# Patient Record
Sex: Female | Born: 1935
Health system: Southern US, Community
[De-identification: ages and names within clinical notes are randomized; demographics above are authoritative.]

## PROBLEM LIST (undated history)

## (undated) DIAGNOSIS — E1121 Type 2 diabetes mellitus with diabetic nephropathy: Secondary | ICD-10-CM

## (undated) DIAGNOSIS — K5792 Diverticulitis of intestine, part unspecified, without perforation or abscess without bleeding: Secondary | ICD-10-CM

## (undated) DIAGNOSIS — E785 Hyperlipidemia, unspecified: Secondary | ICD-10-CM

## (undated) DIAGNOSIS — Z923 Personal history of irradiation: Secondary | ICD-10-CM

## (undated) DIAGNOSIS — D333 Benign neoplasm of cranial nerves: Secondary | ICD-10-CM

## (undated) DIAGNOSIS — L97512 Non-pressure chronic ulcer of other part of right foot with fat layer exposed: Secondary | ICD-10-CM

## (undated) DIAGNOSIS — G629 Polyneuropathy, unspecified: Secondary | ICD-10-CM

## (undated) DIAGNOSIS — Z853 Personal history of malignant neoplasm of breast: Secondary | ICD-10-CM

## (undated) DIAGNOSIS — M810 Age-related osteoporosis without current pathological fracture: Secondary | ICD-10-CM

## (undated) DIAGNOSIS — I779 Disorder of arteries and arterioles, unspecified: Secondary | ICD-10-CM

## (undated) DIAGNOSIS — Z8719 Personal history of other diseases of the digestive system: Secondary | ICD-10-CM

## (undated) DIAGNOSIS — E119 Type 2 diabetes mellitus without complications: Secondary | ICD-10-CM

## (undated) DIAGNOSIS — M1990 Unspecified osteoarthritis, unspecified site: Secondary | ICD-10-CM

## (undated) HISTORY — PX: VENTRAL HERNIA REPAIR: SHX424

## (undated) HISTORY — PX: SKIN GRAFT: SHX250

## (undated) HISTORY — DX: Benign neoplasm of cranial nerves: D33.3

## (undated) HISTORY — DX: Hyperlipidemia, unspecified: E78.5

## (undated) HISTORY — PX: FOOT SURGERY: SHX648

## (undated) HISTORY — DX: Age-related osteoporosis without current pathological fracture: M81.0

## (undated) HISTORY — PX: APPENDECTOMY: SHX54

## (undated) HISTORY — PX: BREAST LUMPECTOMY WITH AXILLARY LYMPH NODE DISSECTION: SHX5756

---

## 1966-05-11 HISTORY — PX: BUNIONECTOMY: SHX129

## 1998-05-11 HISTORY — PX: BREAST LUMPECTOMY: SHX2

## 2000-05-11 HISTORY — PX: BREAST LUMPECTOMY: SHX2

## 2007-05-12 DIAGNOSIS — Z8719 Personal history of other diseases of the digestive system: Secondary | ICD-10-CM

## 2007-05-12 HISTORY — DX: Personal history of other diseases of the digestive system: Z87.19

## 2007-05-12 HISTORY — PX: HEMICOLECTOMY: SHX854

## 2015-05-12 HISTORY — PX: CARPAL TUNNEL RELEASE: SHX101

## 2015-05-12 HISTORY — PX: OTHER SURGICAL HISTORY: SHX169

## 2016-05-12 DIAGNOSIS — Z08 Encounter for follow-up examination after completed treatment for malignant neoplasm: Secondary | ICD-10-CM | POA: Diagnosis not present

## 2016-05-12 DIAGNOSIS — Z7189 Other specified counseling: Secondary | ICD-10-CM | POA: Diagnosis not present

## 2016-05-12 DIAGNOSIS — L821 Other seborrheic keratosis: Secondary | ICD-10-CM | POA: Diagnosis not present

## 2016-05-12 DIAGNOSIS — L853 Xerosis cutis: Secondary | ICD-10-CM | POA: Diagnosis not present

## 2016-05-12 HISTORY — PX: FRACTURE SURGERY: SHX138

## 2016-05-13 DIAGNOSIS — S0993XA Unspecified injury of face, initial encounter: Secondary | ICD-10-CM | POA: Diagnosis not present

## 2016-05-13 DIAGNOSIS — S82002A Unspecified fracture of left patella, initial encounter for closed fracture: Secondary | ICD-10-CM | POA: Diagnosis not present

## 2016-05-13 DIAGNOSIS — S79912A Unspecified injury of left hip, initial encounter: Secondary | ICD-10-CM | POA: Diagnosis not present

## 2016-05-13 DIAGNOSIS — J3489 Other specified disorders of nose and nasal sinuses: Secondary | ICD-10-CM | POA: Diagnosis not present

## 2016-05-13 DIAGNOSIS — S82042A Displaced comminuted fracture of left patella, initial encounter for closed fracture: Secondary | ICD-10-CM | POA: Diagnosis not present

## 2016-05-13 DIAGNOSIS — M25562 Pain in left knee: Secondary | ICD-10-CM | POA: Diagnosis not present

## 2016-05-13 DIAGNOSIS — S0990XA Unspecified injury of head, initial encounter: Secondary | ICD-10-CM | POA: Diagnosis not present

## 2016-05-13 DIAGNOSIS — S0121XA Laceration without foreign body of nose, initial encounter: Secondary | ICD-10-CM | POA: Diagnosis not present

## 2016-05-13 DIAGNOSIS — S7002XA Contusion of left hip, initial encounter: Secondary | ICD-10-CM | POA: Diagnosis not present

## 2016-05-14 DIAGNOSIS — Z7984 Long term (current) use of oral hypoglycemic drugs: Secondary | ICD-10-CM | POA: Diagnosis not present

## 2016-05-14 DIAGNOSIS — M549 Dorsalgia, unspecified: Secondary | ICD-10-CM | POA: Diagnosis present

## 2016-05-14 DIAGNOSIS — E119 Type 2 diabetes mellitus without complications: Secondary | ICD-10-CM | POA: Diagnosis not present

## 2016-05-14 DIAGNOSIS — S82002A Unspecified fracture of left patella, initial encounter for closed fracture: Secondary | ICD-10-CM | POA: Diagnosis not present

## 2016-05-14 DIAGNOSIS — E1140 Type 2 diabetes mellitus with diabetic neuropathy, unspecified: Secondary | ICD-10-CM | POA: Diagnosis not present

## 2016-05-14 DIAGNOSIS — G629 Polyneuropathy, unspecified: Secondary | ICD-10-CM | POA: Diagnosis not present

## 2016-05-14 DIAGNOSIS — S82001A Unspecified fracture of right patella, initial encounter for closed fracture: Secondary | ICD-10-CM | POA: Diagnosis not present

## 2016-05-14 DIAGNOSIS — S7002XA Contusion of left hip, initial encounter: Secondary | ICD-10-CM | POA: Diagnosis not present

## 2016-05-14 DIAGNOSIS — Y92019 Unspecified place in single-family (private) house as the place of occurrence of the external cause: Secondary | ICD-10-CM | POA: Diagnosis not present

## 2016-05-14 DIAGNOSIS — S0993XA Unspecified injury of face, initial encounter: Secondary | ICD-10-CM | POA: Diagnosis not present

## 2016-05-14 DIAGNOSIS — E7800 Pure hypercholesterolemia, unspecified: Secondary | ICD-10-CM | POA: Diagnosis present

## 2016-05-14 DIAGNOSIS — Z87891 Personal history of nicotine dependence: Secondary | ICD-10-CM | POA: Diagnosis not present

## 2016-05-14 DIAGNOSIS — S0121XA Laceration without foreign body of nose, initial encounter: Secondary | ICD-10-CM | POA: Diagnosis not present

## 2016-05-14 DIAGNOSIS — N390 Urinary tract infection, site not specified: Secondary | ICD-10-CM | POA: Diagnosis not present

## 2016-05-14 DIAGNOSIS — R262 Difficulty in walking, not elsewhere classified: Secondary | ICD-10-CM | POA: Diagnosis not present

## 2016-05-14 DIAGNOSIS — S82042A Displaced comminuted fracture of left patella, initial encounter for closed fracture: Secondary | ICD-10-CM | POA: Diagnosis not present

## 2016-05-14 DIAGNOSIS — W010XXA Fall on same level from slipping, tripping and stumbling without subsequent striking against object, initial encounter: Secondary | ICD-10-CM | POA: Diagnosis not present

## 2016-05-14 DIAGNOSIS — S79912A Unspecified injury of left hip, initial encounter: Secondary | ICD-10-CM | POA: Diagnosis not present

## 2016-05-14 DIAGNOSIS — S0990XA Unspecified injury of head, initial encounter: Secondary | ICD-10-CM | POA: Diagnosis not present

## 2016-05-14 DIAGNOSIS — G8929 Other chronic pain: Secondary | ICD-10-CM | POA: Diagnosis present

## 2016-05-14 DIAGNOSIS — S8992XA Unspecified injury of left lower leg, initial encounter: Secondary | ICD-10-CM | POA: Diagnosis not present

## 2016-05-14 DIAGNOSIS — Z6831 Body mass index (BMI) 31.0-31.9, adult: Secondary | ICD-10-CM | POA: Diagnosis not present

## 2016-05-14 DIAGNOSIS — Z853 Personal history of malignant neoplasm of breast: Secondary | ICD-10-CM | POA: Diagnosis not present

## 2016-05-14 DIAGNOSIS — I10 Essential (primary) hypertension: Secondary | ICD-10-CM | POA: Diagnosis not present

## 2016-05-14 DIAGNOSIS — M1990 Unspecified osteoarthritis, unspecified site: Secondary | ICD-10-CM | POA: Diagnosis present

## 2016-05-14 DIAGNOSIS — E669 Obesity, unspecified: Secondary | ICD-10-CM | POA: Diagnosis not present

## 2016-05-19 DIAGNOSIS — R3914 Feeling of incomplete bladder emptying: Secondary | ICD-10-CM | POA: Diagnosis not present

## 2016-05-19 DIAGNOSIS — Z87891 Personal history of nicotine dependence: Secondary | ICD-10-CM | POA: Diagnosis not present

## 2016-05-19 DIAGNOSIS — K5900 Constipation, unspecified: Secondary | ICD-10-CM | POA: Diagnosis present

## 2016-05-19 DIAGNOSIS — J1089 Influenza due to other identified influenza virus with other manifestations: Secondary | ICD-10-CM | POA: Diagnosis not present

## 2016-05-19 DIAGNOSIS — S82042A Displaced comminuted fracture of left patella, initial encounter for closed fracture: Secondary | ICD-10-CM | POA: Diagnosis not present

## 2016-05-19 DIAGNOSIS — S82042D Displaced comminuted fracture of left patella, subsequent encounter for closed fracture with routine healing: Secondary | ICD-10-CM | POA: Diagnosis not present

## 2016-05-19 DIAGNOSIS — Z853 Personal history of malignant neoplasm of breast: Secondary | ICD-10-CM | POA: Diagnosis not present

## 2016-05-19 DIAGNOSIS — I10 Essential (primary) hypertension: Secondary | ICD-10-CM | POA: Diagnosis present

## 2016-05-19 DIAGNOSIS — Z6831 Body mass index (BMI) 31.0-31.9, adult: Secondary | ICD-10-CM | POA: Diagnosis not present

## 2016-05-19 DIAGNOSIS — N390 Urinary tract infection, site not specified: Secondary | ICD-10-CM | POA: Diagnosis not present

## 2016-05-19 DIAGNOSIS — E1142 Type 2 diabetes mellitus with diabetic polyneuropathy: Secondary | ICD-10-CM | POA: Diagnosis present

## 2016-05-19 DIAGNOSIS — M25552 Pain in left hip: Secondary | ICD-10-CM | POA: Diagnosis not present

## 2016-05-19 DIAGNOSIS — S82002A Unspecified fracture of left patella, initial encounter for closed fracture: Secondary | ICD-10-CM | POA: Diagnosis not present

## 2016-05-19 DIAGNOSIS — R339 Retention of urine, unspecified: Secondary | ICD-10-CM | POA: Diagnosis present

## 2016-05-19 DIAGNOSIS — E669 Obesity, unspecified: Secondary | ICD-10-CM | POA: Diagnosis present

## 2016-06-16 DIAGNOSIS — R3914 Feeling of incomplete bladder emptying: Secondary | ICD-10-CM | POA: Diagnosis not present

## 2016-06-16 DIAGNOSIS — N390 Urinary tract infection, site not specified: Secondary | ICD-10-CM | POA: Diagnosis not present

## 2016-06-16 DIAGNOSIS — R300 Dysuria: Secondary | ICD-10-CM | POA: Diagnosis not present

## 2016-07-01 DIAGNOSIS — S82002A Unspecified fracture of left patella, initial encounter for closed fracture: Secondary | ICD-10-CM | POA: Diagnosis not present

## 2016-07-02 DIAGNOSIS — E782 Mixed hyperlipidemia: Secondary | ICD-10-CM | POA: Diagnosis not present

## 2016-07-02 DIAGNOSIS — E119 Type 2 diabetes mellitus without complications: Secondary | ICD-10-CM | POA: Diagnosis not present

## 2016-07-02 DIAGNOSIS — I10 Essential (primary) hypertension: Secondary | ICD-10-CM | POA: Diagnosis not present

## 2016-07-16 DIAGNOSIS — L821 Other seborrheic keratosis: Secondary | ICD-10-CM | POA: Diagnosis not present

## 2016-07-16 DIAGNOSIS — Z85828 Personal history of other malignant neoplasm of skin: Secondary | ICD-10-CM | POA: Diagnosis not present

## 2016-07-16 DIAGNOSIS — Z7189 Other specified counseling: Secondary | ICD-10-CM | POA: Diagnosis not present

## 2016-07-16 DIAGNOSIS — R3914 Feeling of incomplete bladder emptying: Secondary | ICD-10-CM | POA: Diagnosis not present

## 2016-07-16 DIAGNOSIS — N390 Urinary tract infection, site not specified: Secondary | ICD-10-CM | POA: Diagnosis not present

## 2016-07-16 DIAGNOSIS — L570 Actinic keratosis: Secondary | ICD-10-CM | POA: Diagnosis not present

## 2016-07-16 DIAGNOSIS — Z08 Encounter for follow-up examination after completed treatment for malignant neoplasm: Secondary | ICD-10-CM | POA: Diagnosis not present

## 2016-07-20 DIAGNOSIS — S82002A Unspecified fracture of left patella, initial encounter for closed fracture: Secondary | ICD-10-CM | POA: Diagnosis not present

## 2016-07-20 DIAGNOSIS — R2681 Unsteadiness on feet: Secondary | ICD-10-CM | POA: Diagnosis not present

## 2016-07-27 DIAGNOSIS — S82002A Unspecified fracture of left patella, initial encounter for closed fracture: Secondary | ICD-10-CM | POA: Diagnosis not present

## 2016-07-27 DIAGNOSIS — R2681 Unsteadiness on feet: Secondary | ICD-10-CM | POA: Diagnosis not present

## 2016-07-29 DIAGNOSIS — S82002A Unspecified fracture of left patella, initial encounter for closed fracture: Secondary | ICD-10-CM | POA: Diagnosis not present

## 2016-07-29 DIAGNOSIS — R2681 Unsteadiness on feet: Secondary | ICD-10-CM | POA: Diagnosis not present

## 2016-08-03 DIAGNOSIS — S82002A Unspecified fracture of left patella, initial encounter for closed fracture: Secondary | ICD-10-CM | POA: Diagnosis not present

## 2016-08-03 DIAGNOSIS — R2681 Unsteadiness on feet: Secondary | ICD-10-CM | POA: Diagnosis not present

## 2016-08-05 DIAGNOSIS — R2681 Unsteadiness on feet: Secondary | ICD-10-CM | POA: Diagnosis not present

## 2016-08-05 DIAGNOSIS — S82002A Unspecified fracture of left patella, initial encounter for closed fracture: Secondary | ICD-10-CM | POA: Diagnosis not present

## 2016-08-11 DIAGNOSIS — S82002A Unspecified fracture of left patella, initial encounter for closed fracture: Secondary | ICD-10-CM | POA: Diagnosis not present

## 2016-08-11 DIAGNOSIS — R2681 Unsteadiness on feet: Secondary | ICD-10-CM | POA: Diagnosis not present

## 2016-08-13 DIAGNOSIS — S82002A Unspecified fracture of left patella, initial encounter for closed fracture: Secondary | ICD-10-CM | POA: Diagnosis not present

## 2016-08-13 DIAGNOSIS — R2681 Unsteadiness on feet: Secondary | ICD-10-CM | POA: Diagnosis not present

## 2016-08-20 DIAGNOSIS — J449 Chronic obstructive pulmonary disease, unspecified: Secondary | ICD-10-CM | POA: Diagnosis not present

## 2016-08-20 DIAGNOSIS — Z6830 Body mass index (BMI) 30.0-30.9, adult: Secondary | ICD-10-CM | POA: Diagnosis not present

## 2016-08-20 DIAGNOSIS — Z823 Family history of stroke: Secondary | ICD-10-CM | POA: Diagnosis not present

## 2016-08-20 DIAGNOSIS — Z8249 Family history of ischemic heart disease and other diseases of the circulatory system: Secondary | ICD-10-CM | POA: Diagnosis not present

## 2016-08-20 DIAGNOSIS — Z87891 Personal history of nicotine dependence: Secondary | ICD-10-CM | POA: Diagnosis not present

## 2016-08-20 DIAGNOSIS — N950 Postmenopausal bleeding: Secondary | ICD-10-CM | POA: Diagnosis not present

## 2016-08-20 DIAGNOSIS — N840 Polyp of corpus uteri: Secondary | ICD-10-CM | POA: Diagnosis not present

## 2016-08-20 DIAGNOSIS — E1142 Type 2 diabetes mellitus with diabetic polyneuropathy: Secondary | ICD-10-CM | POA: Diagnosis not present

## 2016-08-20 DIAGNOSIS — Z7984 Long term (current) use of oral hypoglycemic drugs: Secondary | ICD-10-CM | POA: Diagnosis not present

## 2016-08-20 DIAGNOSIS — E669 Obesity, unspecified: Secondary | ICD-10-CM | POA: Diagnosis not present

## 2016-08-20 DIAGNOSIS — Z79899 Other long term (current) drug therapy: Secondary | ICD-10-CM | POA: Diagnosis not present

## 2016-08-21 DIAGNOSIS — Z01810 Encounter for preprocedural cardiovascular examination: Secondary | ICD-10-CM | POA: Diagnosis not present

## 2016-08-27 ENCOUNTER — Encounter: Payer: Self-pay | Admitting: Obstetrics and Gynecology

## 2016-08-27 DIAGNOSIS — Z79899 Other long term (current) drug therapy: Secondary | ICD-10-CM | POA: Diagnosis not present

## 2016-08-27 DIAGNOSIS — Z6830 Body mass index (BMI) 30.0-30.9, adult: Secondary | ICD-10-CM | POA: Diagnosis not present

## 2016-08-27 DIAGNOSIS — E669 Obesity, unspecified: Secondary | ICD-10-CM | POA: Diagnosis not present

## 2016-08-27 DIAGNOSIS — N840 Polyp of corpus uteri: Secondary | ICD-10-CM | POA: Diagnosis not present

## 2016-08-27 DIAGNOSIS — Z7984 Long term (current) use of oral hypoglycemic drugs: Secondary | ICD-10-CM | POA: Diagnosis not present

## 2016-08-27 DIAGNOSIS — J449 Chronic obstructive pulmonary disease, unspecified: Secondary | ICD-10-CM | POA: Diagnosis not present

## 2016-08-27 DIAGNOSIS — N950 Postmenopausal bleeding: Secondary | ICD-10-CM | POA: Diagnosis not present

## 2016-08-27 DIAGNOSIS — Z823 Family history of stroke: Secondary | ICD-10-CM | POA: Diagnosis not present

## 2016-08-27 DIAGNOSIS — E1142 Type 2 diabetes mellitus with diabetic polyneuropathy: Secondary | ICD-10-CM | POA: Diagnosis not present

## 2016-08-27 DIAGNOSIS — Z87891 Personal history of nicotine dependence: Secondary | ICD-10-CM | POA: Diagnosis not present

## 2016-08-27 DIAGNOSIS — Z8249 Family history of ischemic heart disease and other diseases of the circulatory system: Secondary | ICD-10-CM | POA: Diagnosis not present

## 2016-09-10 DIAGNOSIS — N840 Polyp of corpus uteri: Secondary | ICD-10-CM | POA: Diagnosis not present

## 2016-09-14 DIAGNOSIS — S82002A Unspecified fracture of left patella, initial encounter for closed fracture: Secondary | ICD-10-CM | POA: Diagnosis not present

## 2016-09-28 DIAGNOSIS — R350 Frequency of micturition: Secondary | ICD-10-CM | POA: Diagnosis not present

## 2016-10-14 ENCOUNTER — Encounter: Payer: Self-pay | Admitting: Internal Medicine

## 2016-10-14 DIAGNOSIS — I10 Essential (primary) hypertension: Secondary | ICD-10-CM | POA: Diagnosis not present

## 2016-10-14 DIAGNOSIS — E119 Type 2 diabetes mellitus without complications: Secondary | ICD-10-CM | POA: Diagnosis not present

## 2016-10-19 DIAGNOSIS — M7662 Achilles tendinitis, left leg: Secondary | ICD-10-CM | POA: Diagnosis not present

## 2016-10-19 DIAGNOSIS — M79671 Pain in right foot: Secondary | ICD-10-CM | POA: Diagnosis not present

## 2016-10-19 DIAGNOSIS — L600 Ingrowing nail: Secondary | ICD-10-CM | POA: Diagnosis not present

## 2016-10-19 DIAGNOSIS — L84 Corns and callosities: Secondary | ICD-10-CM | POA: Diagnosis not present

## 2016-10-19 DIAGNOSIS — B351 Tinea unguium: Secondary | ICD-10-CM | POA: Diagnosis not present

## 2016-10-29 DIAGNOSIS — H40013 Open angle with borderline findings, low risk, bilateral: Secondary | ICD-10-CM | POA: Diagnosis not present

## 2016-10-29 DIAGNOSIS — E119 Type 2 diabetes mellitus without complications: Secondary | ICD-10-CM | POA: Diagnosis not present

## 2016-10-29 DIAGNOSIS — H43813 Vitreous degeneration, bilateral: Secondary | ICD-10-CM | POA: Diagnosis not present

## 2016-10-29 DIAGNOSIS — H2513 Age-related nuclear cataract, bilateral: Secondary | ICD-10-CM | POA: Diagnosis not present

## 2016-11-03 DIAGNOSIS — J342 Deviated nasal septum: Secondary | ICD-10-CM | POA: Diagnosis not present

## 2016-11-09 DIAGNOSIS — L84 Corns and callosities: Secondary | ICD-10-CM | POA: Diagnosis not present

## 2016-11-09 DIAGNOSIS — M7662 Achilles tendinitis, left leg: Secondary | ICD-10-CM | POA: Diagnosis not present

## 2016-11-09 DIAGNOSIS — M79671 Pain in right foot: Secondary | ICD-10-CM | POA: Diagnosis not present

## 2016-11-09 DIAGNOSIS — L600 Ingrowing nail: Secondary | ICD-10-CM | POA: Diagnosis not present

## 2016-11-09 DIAGNOSIS — B351 Tinea unguium: Secondary | ICD-10-CM | POA: Diagnosis not present

## 2016-11-30 DIAGNOSIS — L84 Corns and callosities: Secondary | ICD-10-CM | POA: Diagnosis not present

## 2016-11-30 DIAGNOSIS — M7662 Achilles tendinitis, left leg: Secondary | ICD-10-CM | POA: Diagnosis not present

## 2016-11-30 DIAGNOSIS — B351 Tinea unguium: Secondary | ICD-10-CM | POA: Diagnosis not present

## 2016-11-30 DIAGNOSIS — L600 Ingrowing nail: Secondary | ICD-10-CM | POA: Diagnosis not present

## 2016-11-30 DIAGNOSIS — M79671 Pain in right foot: Secondary | ICD-10-CM | POA: Diagnosis not present

## 2016-12-04 DIAGNOSIS — M7662 Achilles tendinitis, left leg: Secondary | ICD-10-CM | POA: Diagnosis not present

## 2016-12-04 DIAGNOSIS — L600 Ingrowing nail: Secondary | ICD-10-CM | POA: Diagnosis not present

## 2016-12-04 DIAGNOSIS — L84 Corns and callosities: Secondary | ICD-10-CM | POA: Diagnosis not present

## 2016-12-04 DIAGNOSIS — M79671 Pain in right foot: Secondary | ICD-10-CM | POA: Diagnosis not present

## 2016-12-04 DIAGNOSIS — B351 Tinea unguium: Secondary | ICD-10-CM | POA: Diagnosis not present

## 2016-12-05 DIAGNOSIS — M85872 Other specified disorders of bone density and structure, left ankle and foot: Secondary | ICD-10-CM | POA: Diagnosis not present

## 2016-12-05 DIAGNOSIS — S86012A Strain of left Achilles tendon, initial encounter: Secondary | ICD-10-CM | POA: Diagnosis not present

## 2016-12-05 DIAGNOSIS — Z79899 Other long term (current) drug therapy: Secondary | ICD-10-CM | POA: Diagnosis not present

## 2016-12-11 DIAGNOSIS — L84 Corns and callosities: Secondary | ICD-10-CM | POA: Diagnosis not present

## 2016-12-11 DIAGNOSIS — M7662 Achilles tendinitis, left leg: Secondary | ICD-10-CM | POA: Diagnosis not present

## 2016-12-11 DIAGNOSIS — B351 Tinea unguium: Secondary | ICD-10-CM | POA: Diagnosis not present

## 2016-12-11 DIAGNOSIS — L600 Ingrowing nail: Secondary | ICD-10-CM | POA: Diagnosis not present

## 2016-12-11 DIAGNOSIS — M79671 Pain in right foot: Secondary | ICD-10-CM | POA: Diagnosis not present

## 2016-12-14 DIAGNOSIS — M7662 Achilles tendinitis, left leg: Secondary | ICD-10-CM | POA: Diagnosis not present

## 2016-12-14 DIAGNOSIS — B351 Tinea unguium: Secondary | ICD-10-CM | POA: Diagnosis not present

## 2016-12-14 DIAGNOSIS — M79671 Pain in right foot: Secondary | ICD-10-CM | POA: Diagnosis not present

## 2016-12-14 DIAGNOSIS — L600 Ingrowing nail: Secondary | ICD-10-CM | POA: Diagnosis not present

## 2016-12-14 DIAGNOSIS — L84 Corns and callosities: Secondary | ICD-10-CM | POA: Diagnosis not present

## 2016-12-16 DIAGNOSIS — L84 Corns and callosities: Secondary | ICD-10-CM | POA: Diagnosis not present

## 2016-12-16 DIAGNOSIS — L600 Ingrowing nail: Secondary | ICD-10-CM | POA: Diagnosis not present

## 2016-12-16 DIAGNOSIS — M79671 Pain in right foot: Secondary | ICD-10-CM | POA: Diagnosis not present

## 2016-12-16 DIAGNOSIS — B351 Tinea unguium: Secondary | ICD-10-CM | POA: Diagnosis not present

## 2016-12-16 DIAGNOSIS — M7662 Achilles tendinitis, left leg: Secondary | ICD-10-CM | POA: Diagnosis not present

## 2016-12-29 DIAGNOSIS — Z1231 Encounter for screening mammogram for malignant neoplasm of breast: Secondary | ICD-10-CM | POA: Diagnosis not present

## 2016-12-31 DIAGNOSIS — H6123 Impacted cerumen, bilateral: Secondary | ICD-10-CM | POA: Diagnosis not present

## 2017-01-04 DIAGNOSIS — Z853 Personal history of malignant neoplasm of breast: Secondary | ICD-10-CM | POA: Diagnosis not present

## 2017-01-04 DIAGNOSIS — Z08 Encounter for follow-up examination after completed treatment for malignant neoplasm: Secondary | ICD-10-CM | POA: Diagnosis not present

## 2017-01-07 DIAGNOSIS — M7662 Achilles tendinitis, left leg: Secondary | ICD-10-CM | POA: Diagnosis not present

## 2017-01-12 DIAGNOSIS — L600 Ingrowing nail: Secondary | ICD-10-CM | POA: Diagnosis not present

## 2017-01-12 DIAGNOSIS — B351 Tinea unguium: Secondary | ICD-10-CM | POA: Diagnosis not present

## 2017-01-12 DIAGNOSIS — M7662 Achilles tendinitis, left leg: Secondary | ICD-10-CM | POA: Diagnosis not present

## 2017-01-12 DIAGNOSIS — M79671 Pain in right foot: Secondary | ICD-10-CM | POA: Diagnosis not present

## 2017-01-12 DIAGNOSIS — L84 Corns and callosities: Secondary | ICD-10-CM | POA: Diagnosis not present

## 2017-03-24 ENCOUNTER — Encounter: Payer: Self-pay | Admitting: Family Medicine

## 2017-03-24 ENCOUNTER — Other Ambulatory Visit: Payer: Self-pay

## 2017-03-24 ENCOUNTER — Ambulatory Visit (INDEPENDENT_AMBULATORY_CARE_PROVIDER_SITE_OTHER): Payer: Medicare Other | Admitting: Family Medicine

## 2017-03-24 VITALS — BP 124/78 | HR 72 | Temp 97.5°F | Resp 18 | Ht 61.75 in | Wt 175.1 lb

## 2017-03-24 DIAGNOSIS — Z853 Personal history of malignant neoplasm of breast: Secondary | ICD-10-CM | POA: Diagnosis not present

## 2017-03-24 DIAGNOSIS — S022XXS Fracture of nasal bones, sequela: Secondary | ICD-10-CM

## 2017-03-24 DIAGNOSIS — E1142 Type 2 diabetes mellitus with diabetic polyneuropathy: Secondary | ICD-10-CM

## 2017-03-24 DIAGNOSIS — N390 Urinary tract infection, site not specified: Secondary | ICD-10-CM | POA: Diagnosis not present

## 2017-03-24 DIAGNOSIS — E1121 Type 2 diabetes mellitus with diabetic nephropathy: Secondary | ICD-10-CM | POA: Insufficient documentation

## 2017-03-24 DIAGNOSIS — H259 Unspecified age-related cataract: Secondary | ICD-10-CM | POA: Diagnosis not present

## 2017-03-24 DIAGNOSIS — E1140 Type 2 diabetes mellitus with diabetic neuropathy, unspecified: Secondary | ICD-10-CM | POA: Insufficient documentation

## 2017-03-24 DIAGNOSIS — K5730 Diverticulosis of large intestine without perforation or abscess without bleeding: Secondary | ICD-10-CM

## 2017-03-24 DIAGNOSIS — E118 Type 2 diabetes mellitus with unspecified complications: Secondary | ICD-10-CM

## 2017-03-24 DIAGNOSIS — H269 Unspecified cataract: Secondary | ICD-10-CM | POA: Insufficient documentation

## 2017-03-24 DIAGNOSIS — E785 Hyperlipidemia, unspecified: Secondary | ICD-10-CM | POA: Diagnosis not present

## 2017-03-24 DIAGNOSIS — K5790 Diverticulosis of intestine, part unspecified, without perforation or abscess without bleeding: Secondary | ICD-10-CM | POA: Insufficient documentation

## 2017-03-24 HISTORY — DX: Hyperlipidemia, unspecified: E78.5

## 2017-03-24 NOTE — Progress Notes (Signed)
Chief Complaint  Patient presents with  . Diabetes   This is a pleasant 83 year old woman who is here to establish care.  She recently moved to the area and needs a primary care doctor.  She had a primary care doctor in [place] and these records will be requested.  She is a fairly good historian.  No memory impairment.  She and her husband moved here to be closer to their son. She has, by history, well-controlled diabetes mellitus with A1c is consistently under 7.  She sees an eye doctor yearly.  She sees a podiatrist regularly.  She has severe polyneuropathy that she states predates her diabetes.  No known kidney disease or diabetic eye disease. She does have a polyneuropathy.  Hands and feet.  Has seen multiple neurologist.  Has been tried on multiple medications.  The only medicine that helps, partially, is gabapentin.  With this she still has neuropathy pain in her hands and feet, numbness in her fingers that limit her ability to crochet and use her hands for small activities. She does bring with her 1 documented medical visit from her primary care doctor in 2015.  From this I get some of her medical history and her immunizations.  She denies high blood pressure, but this record indicates hypertension and hyperlipidemia.  She is not on any medicine for either of these.  She takes fish oil.  She states that she declined to take medicine for cholesterol.  She denies any coronary artery disease, or vascular disease. She states she has a history of recurring UTIs.  She states for this she was seen by urologist.  She states the urologist put her on tamsulosin.  She states she ran out of this medication.  She is uncertain whether she needs to go back to the urologist.  I will request his records She would like referral to an eye doctor for an updated eye exam.  She has a cataract that she states needs to be removed. She would like a referral to ENT.  She states that she fell and broke her  nose, and has a loose piece of cartilage that impairs her breathing through her left nostril. She feels unable to exercise due to her neuropathy pain.  She does try to eat well.  She knows that she is overweight.  She states that she has lost almost 20 pounds in the last year. She gets yearly mammograms because of her history of bilateral breast cancer.  She wishes to continue these. She does not get colonoscopies anymore.  I think this is appropriate at 89.   Patient Active Problem List   Diagnosis Date Noted  . Controlled diabetes mellitus type 2 with complications ([place]) 15/94/5859  . Diabetic neuropathy associated with type 2 diabetes mellitus ([place]) 03/24/2017  . Cataracts, bilateral 03/24/2017  . History of bilateral breast cancer 03/24/2017  . Recurrent UTI 03/24/2017  . HLD (hyperlipidemia) 03/24/2017  . Diverticulosis 03/24/2017    Outpatient Encounter Medications as of 03/24/2017  Medication Sig  . gabapentin (NEURONTIN) 400 MG capsule Take 800 mg 4 (four) times daily by mouth.  . metFORMIN (GLUCOPHAGE) 500 MG tablet Take daily by mouth.  . Multiple Vitamin (MULTIVITAMIN) capsule Take 1 capsule daily by mouth.  . TURMERIC CURCUMIN PO Take by mouth.   No facility-administered encounter medications on file as of 03/24/2017.     Past Medical History:  Diagnosis Date  . Allergy   . Anemia   . Arthritis   .  Cancer ([place]) 1997, 2000   breast  . Cataract   . Diabetes mellitus without complication ([place])   . GERD (gastroesophageal reflux disease)   . HLD (hyperlipidemia) 03/24/2017    Past Surgical History:  Procedure Laterality Date  . APPENDECTOMY    . BILATERAL CARPAL TUNNEL RELEASE    . BREAST SURGERY    . 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 FRACTURE SURGERY     knee fracture  . HERNIA REPAIR     ventral    Social History   Socioeconomic History  . Marital status: Married    Spouse name: Not on file  . Number of children: 1  . Years  of education: 25  . Highest education level: Not on file  Social Needs  . Financial resource strain: Not on file  . Food insecurity - worry: Not on file  . Food insecurity - inability: Not on file  . Transportation needs - medical: Not on file  . Transportation needs - non-medical: Not on file  Occupational History  . Occupation: retired  Tobacco Use  . Smoking status: Former Smoker    Packs/day: 1.00    Types: Cigarettes    Start date: 05/11/1950    Last attempt to quit: 05/12/1963    Years since quitting: 53.9  . Smokeless tobacco: Never Used  Substance and Sexual Activity  . Alcohol use: No    Frequency: Never  . Drug use: No  . Sexual activity: Not Currently  Other Topics Concern  . Not on file  Social History Narrative   Lives with husband [name]   One son [name] lives in [place]     Family History  Problem Relation Age of Onset  . Diabetes Mother   . Heart disease Mother   . Arthritis Mother   . Hypertension Mother   . Miscarriages / [place] Mother   . Early death Father   . Stroke Father   . Alcohol abuse Father     Review of Systems  Constitutional: Negative for chills, fever and weight loss.  HENT: Negative for congestion and hearing loss.   Eyes: Positive for blurred vision. Negative for pain, discharge and redness.       Known cataracts  Respiratory: Negative for cough and shortness of breath.   Cardiovascular: Negative for chest pain and leg swelling.  Gastrointestinal: Positive for constipation. Negative for abdominal pain, diarrhea and heartburn.       Occasional constipation  Genitourinary: Negative for dysuria and frequency.  Musculoskeletal: Negative for falls, joint pain and myalgias.  Neurological: Positive for tingling. Negative for dizziness, seizures and headaches.       Tingling, burning, numbness, severe neuropathic pain hands and feet  Psychiatric/Behavioral: Negative for depression and memory loss. The patient has insomnia. The  patient is not nervous/anxious.        Sleep disturbance due to pain   BP 124/78 (BP Location: Right Arm, Patient Position: Sitting, Cuff Size: Normal)   Pulse 72   Temp (!) 97.5 F (36.4 C) (Temporal)   Resp 18   Ht 5' 1.75" (1.568 m)   Wt 175 lb 1.3 oz (79.4 kg)   SpO2 98%   BMI 32.28 kg/m   Physical Exam  Constitutional: She is oriented to person, place, and time. She appears well-developed and well-nourished.  HENT:  Head: Normocephalic and atraumatic.  Mouth/Throat: Oropharynx is clear and moist.  Eyes: EOM are normal. Pupils are equal, round, and reactive  to light.  Neck: Normal range of motion. No thyromegaly present.  Cardiovascular: Normal rate and regular rhythm.  Murmur heard. Pulmonary/Chest: Effort normal and breath sounds normal. She has no wheezes. She has no rales.  Abdominal:  Abdominal obesity  Musculoskeletal: Normal range of motion. She exhibits no edema.  Lymphadenopathy:    She has no cervical adenopathy.  Neurological: She is alert and oriented to person, place, and time.  Psychiatric: She has a normal mood and affect. Her behavior is normal. Thought content normal.  ASSESSMENT/PLAN:  1. Controlled type 2 diabetes mellitus with complication, without long-term current use of insulin ([facility]) - CBC - Comprehensive metabolic panel - Hemoglobin A1c - Lipid panel - Microalbumin / creatinine urine ratio - Urinalysis, Routine w reflex microscopic  2. Diabetic polyneuropathy associated with type 2 diabetes mellitus ([place]) Patient states neuropathy may not be from diabetes.  Old notes indicate it probably is.  No other etiology was identified.  3. Age-related cataract of both eyes, unspecified age-related cataract type Patient has been waiting to get her cataracts fixed until she moved - Ambulatory referral to Ophthalmology  4. History of bilateral breast cancer Yearly mammograms  5. Recurrent UTI By history.  Urology evaluation requested  6. Open  fracture of nasal bone, sequela By history.  ENT referral requested - Ambulatory referral to ENT  7. Hyperlipidemia, unspecified hyperlipidemia type By history we will check labs.  Would not start a statin in asymptomatic patient at 34.  8. Diverticulosis of large intestine without hemorrhage By history, no problems with diverticulosis since she had a partial colectomy   Patient Instructions  Need records from old PCP  Continue same medicines Stay as active as you can manage  I will place referral to Eye doctor and ENT I will get records from urologist  See me in a month for a PE     Raylene Everts, MD

## 2017-03-24 NOTE — Patient Instructions (Signed)
Need records from old PCP  Continue same medicines Stay as active as you can manage  I will place referral to Eye doctor and ENT I will get records from urologist  See me in a month for a PE

## 2017-04-14 ENCOUNTER — Encounter: Payer: Self-pay | Admitting: Family Medicine

## 2017-04-14 DIAGNOSIS — R42 Dizziness and giddiness: Secondary | ICD-10-CM | POA: Insufficient documentation

## 2017-04-14 DIAGNOSIS — H8190 Unspecified disorder of vestibular function, unspecified ear: Secondary | ICD-10-CM | POA: Insufficient documentation

## 2017-04-14 DIAGNOSIS — M1990 Unspecified osteoarthritis, unspecified site: Secondary | ICD-10-CM | POA: Insufficient documentation

## 2017-04-14 DIAGNOSIS — M8580 Other specified disorders of bone density and structure, unspecified site: Secondary | ICD-10-CM | POA: Insufficient documentation

## 2017-04-14 DIAGNOSIS — M810 Age-related osteoporosis without current pathological fracture: Secondary | ICD-10-CM

## 2017-04-14 HISTORY — DX: Age-related osteoporosis without current pathological fracture: M81.0

## 2017-04-29 DIAGNOSIS — M9901 Segmental and somatic dysfunction of cervical region: Secondary | ICD-10-CM | POA: Diagnosis not present

## 2017-04-29 DIAGNOSIS — M542 Cervicalgia: Secondary | ICD-10-CM | POA: Diagnosis not present

## 2017-04-29 DIAGNOSIS — M546 Pain in thoracic spine: Secondary | ICD-10-CM | POA: Diagnosis not present

## 2017-04-29 DIAGNOSIS — M9902 Segmental and somatic dysfunction of thoracic region: Secondary | ICD-10-CM | POA: Diagnosis not present

## 2017-05-03 ENCOUNTER — Emergency Department (HOSPITAL_COMMUNITY): Payer: Medicare Other

## 2017-05-03 ENCOUNTER — Encounter (HOSPITAL_COMMUNITY): Payer: Self-pay | Admitting: *Deleted

## 2017-05-03 ENCOUNTER — Inpatient Hospital Stay (HOSPITAL_COMMUNITY)
Admission: EM | Admit: 2017-05-03 | Discharge: 2017-05-09 | DRG: 623 | Disposition: A | Discharge status: Home / Self Care | Payer: Medicare Other | Attending: Internal Medicine | Admitting: Internal Medicine

## 2017-05-03 DIAGNOSIS — Z885 Allergy status to narcotic agent status: Secondary | ICD-10-CM | POA: Diagnosis not present

## 2017-05-03 DIAGNOSIS — E871 Hypo-osmolality and hyponatremia: Secondary | ICD-10-CM | POA: Diagnosis present

## 2017-05-03 DIAGNOSIS — L089 Local infection of the skin and subcutaneous tissue, unspecified: Secondary | ICD-10-CM | POA: Diagnosis not present

## 2017-05-03 DIAGNOSIS — L02611 Cutaneous abscess of right foot: Secondary | ICD-10-CM | POA: Diagnosis not present

## 2017-05-03 DIAGNOSIS — E11628 Type 2 diabetes mellitus with other skin complications: Secondary | ICD-10-CM | POA: Insufficient documentation

## 2017-05-03 DIAGNOSIS — M7989 Other specified soft tissue disorders: Secondary | ICD-10-CM | POA: Diagnosis not present

## 2017-05-03 DIAGNOSIS — E119 Type 2 diabetes mellitus without complications: Secondary | ICD-10-CM

## 2017-05-03 DIAGNOSIS — L03115 Cellulitis of right lower limb: Secondary | ICD-10-CM | POA: Diagnosis present

## 2017-05-03 DIAGNOSIS — Z87891 Personal history of nicotine dependence: Secondary | ICD-10-CM | POA: Diagnosis not present

## 2017-05-03 DIAGNOSIS — D638 Anemia in other chronic diseases classified elsewhere: Secondary | ICD-10-CM | POA: Diagnosis present

## 2017-05-03 DIAGNOSIS — E1142 Type 2 diabetes mellitus with diabetic polyneuropathy: Secondary | ICD-10-CM | POA: Diagnosis not present

## 2017-05-03 DIAGNOSIS — Z833 Family history of diabetes mellitus: Secondary | ICD-10-CM

## 2017-05-03 DIAGNOSIS — L97518 Non-pressure chronic ulcer of other part of right foot with other specified severity: Secondary | ICD-10-CM

## 2017-05-03 DIAGNOSIS — M810 Age-related osteoporosis without current pathological fracture: Secondary | ICD-10-CM | POA: Diagnosis present

## 2017-05-03 DIAGNOSIS — E11621 Type 2 diabetes mellitus with foot ulcer: Principal | ICD-10-CM

## 2017-05-03 DIAGNOSIS — Z7984 Long term (current) use of oral hypoglycemic drugs: Secondary | ICD-10-CM | POA: Diagnosis not present

## 2017-05-03 DIAGNOSIS — L97519 Non-pressure chronic ulcer of other part of right foot with unspecified severity: Secondary | ICD-10-CM | POA: Diagnosis not present

## 2017-05-03 DIAGNOSIS — E785 Hyperlipidemia, unspecified: Secondary | ICD-10-CM | POA: Diagnosis present

## 2017-05-03 DIAGNOSIS — E1149 Type 2 diabetes mellitus with other diabetic neurological complication: Secondary | ICD-10-CM

## 2017-05-03 DIAGNOSIS — E1140 Type 2 diabetes mellitus with diabetic neuropathy, unspecified: Secondary | ICD-10-CM | POA: Diagnosis present

## 2017-05-03 DIAGNOSIS — E1151 Type 2 diabetes mellitus with diabetic peripheral angiopathy without gangrene: Secondary | ICD-10-CM | POA: Diagnosis present

## 2017-05-03 DIAGNOSIS — K219 Gastro-esophageal reflux disease without esophagitis: Secondary | ICD-10-CM | POA: Diagnosis present

## 2017-05-03 DIAGNOSIS — E861 Hypovolemia: Secondary | ICD-10-CM | POA: Diagnosis present

## 2017-05-03 DIAGNOSIS — L97509 Non-pressure chronic ulcer of other part of unspecified foot with unspecified severity: Secondary | ICD-10-CM

## 2017-05-03 DIAGNOSIS — L0390 Cellulitis, unspecified: Secondary | ICD-10-CM | POA: Diagnosis present

## 2017-05-03 HISTORY — DX: Type 2 diabetes mellitus with diabetic nephropathy: E11.21

## 2017-05-03 LAB — COMPREHENSIVE METABOLIC PANEL
ALT: 18 U/L (ref 14–54)
AST: 24 U/L (ref 15–41)
Albumin: 3.6 g/dL (ref 3.5–5.0)
Alkaline Phosphatase: 74 U/L (ref 38–126)
Anion gap: 11 (ref 5–15)
BUN: 18 mg/dL (ref 6–20)
CHLORIDE: 99 mmol/L — AB (ref 101–111)
CO2: 22 mmol/L (ref 22–32)
Calcium: 8.9 mg/dL (ref 8.9–10.3)
Creatinine, Ser: 0.84 mg/dL (ref 0.44–1.00)
Glucose, Bld: 212 mg/dL — ABNORMAL HIGH (ref 65–99)
POTASSIUM: 3.8 mmol/L (ref 3.5–5.1)
Sodium: 132 mmol/L — ABNORMAL LOW (ref 135–145)
Total Bilirubin: 0.9 mg/dL (ref 0.3–1.2)
Total Protein: 7.2 g/dL (ref 6.5–8.1)

## 2017-05-03 LAB — CBC WITH DIFFERENTIAL/PLATELET
Basophils Absolute: 0 10*3/uL (ref 0.0–0.1)
Basophils Relative: 0 %
EOS ABS: 0 10*3/uL (ref 0.0–0.7)
EOS PCT: 0 %
HCT: 38.5 % (ref 36.0–46.0)
HEMOGLOBIN: 12.4 g/dL (ref 12.0–15.0)
LYMPHS ABS: 0.8 10*3/uL (ref 0.7–4.0)
Lymphocytes Relative: 7 %
MCH: 28 pg (ref 26.0–34.0)
MCHC: 32.2 g/dL (ref 30.0–36.0)
MCV: 86.9 fL (ref 78.0–100.0)
MONO ABS: 0.8 10*3/uL (ref 0.1–1.0)
MONOS PCT: 7 %
NEUTROS PCT: 86 %
Neutro Abs: 9.3 10*3/uL — ABNORMAL HIGH (ref 1.7–7.7)
Platelets: 171 10*3/uL (ref 150–400)
RBC: 4.43 MIL/uL (ref 3.87–5.11)
RDW: 14.4 % (ref 11.5–15.5)
WBC: 11 10*3/uL — ABNORMAL HIGH (ref 4.0–10.5)

## 2017-05-03 LAB — CBG MONITORING, ED: GLUCOSE-CAPILLARY: 217 mg/dL — AB (ref 65–99)

## 2017-05-03 LAB — LACTIC ACID, PLASMA: LACTIC ACID, VENOUS: 2.3 mmol/L — AB (ref 0.5–1.9)

## 2017-05-03 MED ORDER — SODIUM CHLORIDE 0.9 % IV SOLN
INTRAVENOUS | Status: AC
  Administered 2017-05-04: 01:00:00 via INTRAVENOUS

## 2017-05-03 MED ORDER — BISACODYL 5 MG PO TBEC: 5.0000 mg | DELAYED_RELEASE_TABLET | Freq: Every day | ORAL | Status: DC | PRN

## 2017-05-03 MED ORDER — ENOXAPARIN SODIUM 40 MG/0.4ML ~~LOC~~ SOLN
40.0000 mg | SUBCUTANEOUS | Status: DC
  Administered 2017-05-04 – 2017-05-09 (×6): 40 mg via SUBCUTANEOUS
  Filled 2017-05-03 (×7): qty 0.4

## 2017-05-03 MED ORDER — DEXTROSE 5 % IV SOLN
2.0000 g | INTRAVENOUS | Status: DC
  Administered 2017-05-04 – 2017-05-08 (×6): 2 g via INTRAVENOUS
  Filled 2017-05-03 (×8): qty 2

## 2017-05-03 MED ORDER — GABAPENTIN 400 MG PO CAPS
800.0000 mg | ORAL_CAPSULE | Freq: Four times a day (QID) | ORAL | Status: DC
  Administered 2017-05-04 – 2017-05-09 (×23): 800 mg via ORAL
  Filled 2017-05-03 (×24): qty 2

## 2017-05-03 MED ORDER — VANCOMYCIN HCL IN DEXTROSE 1-5 GM/200ML-% IV SOLN
1000.0000 mg | Freq: Once | INTRAVENOUS | Status: AC
  Administered 2017-05-03: 1000 mg via INTRAVENOUS
  Filled 2017-05-03: qty 200

## 2017-05-03 MED ORDER — SODIUM CHLORIDE 0.9 % IV SOLN: INTRAVENOUS | Status: DC

## 2017-05-03 MED ORDER — SODIUM CHLORIDE 0.9 % IV BOLUS (SEPSIS)
250.0000 mL | Freq: Once | INTRAVENOUS | Status: AC
  Administered 2017-05-03: 250 mL via INTRAVENOUS

## 2017-05-03 MED ORDER — ONDANSETRON HCL 4 MG/2ML IJ SOLN: 4.0000 mg | Freq: Four times a day (QID) | INTRAMUSCULAR | Status: DC | PRN

## 2017-05-03 MED ORDER — INSULIN ASPART 100 UNIT/ML ~~LOC~~ SOLN
0.0000 [IU] | Freq: Three times a day (TID) | SUBCUTANEOUS | Status: DC
  Administered 2017-05-06: 2 [IU] via SUBCUTANEOUS
  Administered 2017-05-07 (×2): 1 [IU] via SUBCUTANEOUS
  Administered 2017-05-08: 2 [IU] via SUBCUTANEOUS

## 2017-05-03 MED ORDER — METRONIDAZOLE IN NACL 5-0.79 MG/ML-% IV SOLN
500.0000 mg | Freq: Three times a day (TID) | INTRAVENOUS | Status: DC
  Administered 2017-05-04 – 2017-05-08 (×14): 500 mg via INTRAVENOUS
  Filled 2017-05-03 (×14): qty 100

## 2017-05-03 MED ORDER — INSULIN ASPART 100 UNIT/ML ~~LOC~~ SOLN: 0.0000 [IU] | Freq: Every day | SUBCUTANEOUS | Status: DC

## 2017-05-03 MED ORDER — ADULT MULTIVITAMIN W/MINERALS CH
1.0000 | ORAL_TABLET | Freq: Every day | ORAL | Status: DC
  Administered 2017-05-04 – 2017-05-09 (×6): 1 via ORAL
  Filled 2017-05-03 (×7): qty 1

## 2017-05-03 MED ORDER — HYDROCODONE-ACETAMINOPHEN 5-325 MG PO TABS: 1.0000 | ORAL_TABLET | ORAL | Status: DC | PRN

## 2017-05-03 MED ORDER — ACETAMINOPHEN 325 MG PO TABS
650.0000 mg | ORAL_TABLET | Freq: Four times a day (QID) | ORAL | Status: DC | PRN
  Administered 2017-05-06: 650 mg via ORAL
  Filled 2017-05-03 (×2): qty 2

## 2017-05-03 MED ORDER — SENNOSIDES-DOCUSATE SODIUM 8.6-50 MG PO TABS: 1.0000 | ORAL_TABLET | Freq: Every evening | ORAL | Status: DC | PRN

## 2017-05-03 MED ORDER — ACETAMINOPHEN 650 MG RE SUPP: 650.0000 mg | Freq: Four times a day (QID) | RECTAL | Status: DC | PRN

## 2017-05-03 MED ORDER — ONDANSETRON HCL 4 MG PO TABS: 4.0000 mg | ORAL_TABLET | Freq: Four times a day (QID) | ORAL | Status: DC | PRN

## 2017-05-03 NOTE — H&P (Signed)
History and Physical    Jessica Russell CWC:[id number] DOB: March 28, 1928 DOA: 05/03/2017  PCP: Raylene Everts, MD   Patient coming from: Home  Chief Complaint: Right foot ulcer with purulent drainage and expanding erythema   HPI: Jessica Russell is a 83 y.o. female with medical history significant for type 2 diabetes mellitus and peripheral neuropathy, now presenting to the emergency department for evaluation of a right foot ulcer with purulent drainage and surrounding erythema.  The patient reports that she had been in her usual state of health, unable to feel her bilateral feet secondary to neuropathy, noted blistering and a wound at the plantar aspect of her right foot between the first 2 toes approximately 1 week ago.  Since that time, she reports purulent drainage from the site and also has noted increasing erythema, now progressing proximally up her leg.  Denies fevers or chills and has otherwise been well.  ED Course: Upon arrival to the ED, patient is found to be afebrile, saturating well on room air, and with vitals otherwise normal.  Radiographs of the right foot tissue air along the medial and plantar aspect of the right great toe at the level of the first MTP joint, but no definite osseous erosion.  Chemistry panel is notable for mild hyponatremia and glucose of 212 CBC features a leukocytosis to 11,000.  Day, 250 cc normal saline was administered, and the patient was started on empiric vancomycin.  Is a stable and in no apparent respiratory distress in the emergency department, and she will be admitted to the medical-surgical unit for ongoing evaluation and management of diabetic foot ulcer with infection.  Review of Systems:  All other systems reviewed and apart from HPI, are negative.  Past Medical History:  Diagnosis Date  . Allergy   . Anemia   . Arthritis   . Cancer ([place]) 1997, 2000   breast  . Cataract   . Diabetes mellitus without complication ([place])   . Diabetic  nephropathy ([place])   . GERD (gastroesophageal reflux disease)   . HLD (hyperlipidemia) 03/24/2017  . Osteoporosis 04/14/2017    Past Surgical History:  Procedure Laterality Date  . APPENDECTOMY    . BILATERAL CARPAL TUNNEL RELEASE    . BREAST SURGERY    . 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 FRACTURE SURGERY     knee fracture  . HERNIA REPAIR     ventral     reports that she quit smoking about 54 years ago. Her smoking use included cigarettes. She started smoking about 67 years ago. She smoked 1.00 pack per day. she has never used smokeless tobacco. She reports that she does not drink alcohol or use drugs.  Allergies  Allergen Reactions  . Phenergan [Promethazine Hcl] Other (See Comments)    Made delerious    Family History  Problem Relation Age of Onset  . Diabetes Mother   . Heart disease Mother   . Arthritis Mother   . Hypertension Mother   . Miscarriages / [place] Mother   . Early death Father   . Stroke Father   . Alcohol abuse Father      Prior to Admission medications   Medication Sig Start Date End Date Taking? Authorizing Provider  acetaminophen (TYLENOL) 500 MG tablet Take 500 mg by mouth every 6 (six) hours as needed for mild pain or moderate pain.   Yes [provider]  gabapentin (NEURONTIN) 400 MG capsule Take 800 mg 4 (four)  times daily by mouth.   Yes [provider]  metFORMIN (GLUCOPHAGE) 500 MG tablet Take 500 mg by mouth daily after supper.    Yes [provider]  Multiple Vitamin (MULTIVITAMIN) capsule Take 1 capsule daily by mouth.   Yes [provider]  NON FORMULARY Take 2 capsules by mouth daily. [place]   Yes [provider]    Physical Exam: Vitals:   05/03/17 2036 05/03/17 2037  BP: (!) 148/72   Pulse: 91   Resp: 18   Temp: 98.5 F (36.9 C)   TempSrc: Oral   SpO2: 98%   Weight:  80.7 kg (178 lb)  Height:  5\' 3"  (1.6 m)      Constitutional: NAD,  calm Eyes: PERTLA, lids and conjunctivae normal ENMT: Mucous membranes are moist. Posterior pharynx clear of any exudate or lesions.   Neck: normal, supple, no masses, no thyromegaly Respiratory: clear to auscultation bilaterally, no wheezing, no crackles. Normal respiratory effort.   Cardiovascular: S1 & S2 heard, regular rate and rhythm. No significant JVD. Abdomen: No distension, no tenderness, no masses palpated. Bowel sounds normal.  Musculoskeletal: no clubbing / cyanosis. Right foot findings below, otherwise no red, hot, or swollen joint.  Skin: ulcer at plantar aspect right foot at level of 1st MTP with fluctuance, purulent discharge expressed; erythema and edema extends proximally to ankle. Skin is otherwise warm, dry, well-perfused. Neurologic: CN 2-12 grossly intact. Sensation markedly diminished in bilateral feet.  Strength 5/5 in all 4 limbs.  Psychiatric: Alert and oriented x 3. Very pleasant and cooperative.     Labs on Admission: I have personally reviewed following labs and imaging studies  CBC: Recent Labs  Lab 05/03/17 2140  WBC 11.0*  NEUTROABS 9.3*  HGB 12.4  HCT 38.5  MCV 86.9  PLT 016   Basic Metabolic Panel: Recent Labs  Lab 05/03/17 2140  NA 132*  K 3.8  CL 99*  CO2 22  GLUCOSE 212*  BUN 18  CREATININE 0.84  CALCIUM 8.9   GFR: Estimated Creatinine Clearance: 47.4 mL/min (by C-G formula based on SCr of 0.84 mg/dL). Liver Function Tests: Recent Labs  Lab 05/03/17 2140  AST 24  ALT 18  ALKPHOS 74  BILITOT 0.9  PROT 7.2  ALBUMIN 3.6   No results for input(s): LIPASE, AMYLASE in the last 168 hours. No results for input(s): AMMONIA in the last 168 hours. Coagulation Profile: No results for input(s): INR, PROTIME in the last 168 hours. Cardiac Enzymes: No results for input(s): CKTOTAL, CKMB, CKMBINDEX, TROPONINI in the last 168 hours. BNP (last 3 results) No results for input(s): PROBNP in the last 8760 hours. HbA1C: No results for  input(s): HGBA1C in the last 72 hours. CBG: Recent Labs  Lab 05/03/17 2137  GLUCAP 217*   Lipid Profile: No results for input(s): CHOL, HDL, LDLCALC, TRIG, CHOLHDL, LDLDIRECT in the last 72 hours. Thyroid Function Tests: No results for input(s): TSH, T4TOTAL, FREET4, T3FREE, THYROIDAB in the last 72 hours. Anemia Panel: No results for input(s): VITAMINB12, FOLATE, FERRITIN, TIBC, IRON, RETICCTPCT in the last 72 hours. Urine analysis: No results found for: COLORURINE, APPEARANCEUR, LABSPEC, [place], GLUCOSEU, HGBUR, BILIRUBINUR, KETONESUR, PROTEINUR, UROBILINOGEN, NITRITE, LEUKOCYTESUR Sepsis Labs: @LABRCNTIP (procalcitonin:4,lacticidven:4) ) Recent Results (from the past 240 hour(s))  Culture, blood (Routine X 2) w Reflex to ID Panel     Status: None (Preliminary result)   Collection Time: 05/03/17 10:02 PM  Result Value Ref Range Status   Specimen Description BLOOD LEFT ARM  Final   Special Requests   Final    BOTTLES DRAWN AEROBIC AND ANAEROBIC Blood Culture adequate volume   Culture PENDING  Incomplete   Report Status PENDING  Incomplete  Culture, blood (Routine X 2) w Reflex to ID Panel     Status: None (Preliminary result)   Collection Time: 05/03/17 10:05 PM  Result Value Ref Range Status   Specimen Description BLOOD LEFT HAND  Final   Special Requests   Final    BOTTLES DRAWN AEROBIC AND ANAEROBIC Blood Culture results may not be optimal due to an inadequate volume of blood received in culture bottles   Culture PENDING  Incomplete   Report Status PENDING  Incomplete     Radiological Exams on Admission: Dg Foot Complete Right  Result Date: 05/03/2017 CLINICAL DATA:  Acute onset of right foot erythema and swelling, particularly about the great toe. Chronic right foot pain. EXAM: RIGHT FOOT COMPLETE - 3+ VIEW COMPARISON:  None. FINDINGS: Scattered soft tissue air is noted along the medial and plantar aspects of the great toe, at the level of the first metatarsophalangeal  joint, concerning for infection with a gas producing organism. Necrotizing fasciitis cannot be excluded. No definite osseous erosions are seen. Visualized joint spaces are grossly preserved. Scattered vascular calcifications are noted. The subtalar joint is unremarkable. There is no evidence of fracture or dislocation. IMPRESSION: Soft tissue air along the medial and plantar aspects of the great toe, at the level of the first metatarsophalangeal joint, concerning for infection with a gas producing organism. Necrotizing fasciitis cannot be excluded. No definite osseous erosions seen. These results were called by telephone at the time of interpretation on 05/03/2017 at 9:22 pm to Dr. Rogene Houston, who verbally acknowledged these results. Electronically Signed   By: Garald Balding M.D.   On: 05/03/2017 21:23    EKG: not performed.   Assessment/Plan  1. Diabetic foot ulcer with infection  - Presents with at least 1 week of blistering and ulceration at plantar aspect right foot overlying first MTP with recent purulent drainage and progression of erythema up the leg  - Afebrile and hemodynamically stable on admission with mild leukocytosis  - Radiographs reveal gas, likely from overlying ulcer/open wound; she does not appears toxic; no definite osseous erosion on plain films   - Blood cultures were collected in ED and empiric vancomycin administered - Check ABI and inflammatory markers, continue empiric treatment with Rocephin, Flagyl, vancomycin, consult with wound-care    2. Type II DM  - No A1c on file  - Managed with metformin only at home  - Complicated by neuropathy with foot ulcer as above  - Check CBG's, start a SSI with Novolog    3. Hyponatremia  - Serum sodium is 132 on admission in setting of hypovolemia  - Treated with 250 cc NS in ED and continued on IVF hydration - Repeat chem panel in am     DVT prophylaxis: Lovenox Code Status: Full  Family Communication: Husband and son updated  at bedside Disposition Plan: Admit to med-surg Consults called: None Admission status: Inpatient    Vianne Bulls, MD [facility] Pager [phone]  If 7PM-7AM, please contact night-coverage [email or web address] Password [facility]  05/03/2017, 10:55 PM

## 2017-05-03 NOTE — ED Notes (Signed)
Critical result. Lactic acid: 2.3 MD Zackowski notified.

## 2017-05-03 NOTE — ED Provider Notes (Signed)
[facility] EMERGENCY DEPARTMENT Provider Note   CSN: [id number] Arrival date & time: 05/03/17  2025     History   Chief Complaint Chief Complaint  Patient presents with  . Foot Swelling    HPI Jessica Russell is a 83 y.o. female.  Patient with a history of diabetes and neuropathy.  Recently moved here from the [place] area.  Patient was followed in [place] area before.  Patient states she had checked her blood sugars in 2 weeks.  But for 1 week she has had a wound on the bottom of her right foot.  Patient states that had purulent discharge.  Denies any fevers.      Past Medical History:  Diagnosis Date  . Allergy   . Anemia   . Arthritis   . Cancer ([place]) 1997, 2000   breast  . Cataract   . Diabetes mellitus without complication ([place])   . Diabetic nephropathy ([place])   . GERD (gastroesophageal reflux disease)   . HLD (hyperlipidemia) 03/24/2017  . Osteoporosis 04/14/2017    Patient Active Problem List   Diagnosis Date Noted  . Chronic osteoarthritis 04/14/2017  . Episodic recurrent vertigo 04/14/2017  . Osteoporosis 04/14/2017  . Diabetic nephropathy ([place]) 03/24/2017  . Diabetic neuropathy associated with type 2 diabetes mellitus ([place]) 03/24/2017  . Cataracts, bilateral 03/24/2017  . History of bilateral breast cancer 03/24/2017  . Recurrent UTI 03/24/2017  . HLD (hyperlipidemia) 03/24/2017  . Diverticulosis 03/24/2017    Past Surgical History:  Procedure Laterality Date  . APPENDECTOMY    . BILATERAL CARPAL TUNNEL RELEASE    . BREAST SURGERY    . 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 FRACTURE SURGERY     knee fracture  . HERNIA REPAIR     ventral    OB History    No data available       Home Medications    Prior to Admission medications   Medication Sig Start Date End Date Taking? Authorizing Provider  acetaminophen (TYLENOL) 500 MG tablet Take 500 mg by mouth every 6 (six) hours as needed for mild pain or moderate  pain.   Yes [provider]  gabapentin (NEURONTIN) 400 MG capsule Take 800 mg 4 (four) times daily by mouth.   Yes [provider]  metFORMIN (GLUCOPHAGE) 500 MG tablet Take 500 mg by mouth daily after supper.    Yes [provider]  Multiple Vitamin (MULTIVITAMIN) capsule Take 1 capsule daily by mouth.   Yes [provider]  NON FORMULARY Take 2 capsules by mouth daily. [facility]   Yes [provider]    Family History Family History  Problem Relation Age of Onset  . Diabetes Mother   . Heart disease Mother   . Arthritis Mother   . Hypertension Mother   . Miscarriages / [place] Mother   . Early death Father   . Stroke Father   . Alcohol abuse Father     Social History Social History   Tobacco Use  . Smoking status: Former Smoker    Packs/day: 1.00    Types: Cigarettes    Start date: 05/11/1950    Last attempt to quit: 05/12/1963    Years since quitting: 54.0  . Smokeless tobacco: Never Used  Substance Use Topics  . Alcohol use: No    Frequency: Never  . Drug use: No     Allergies   Phenergan [promethazine hcl]   Review of  Systems Review of Systems  Constitutional: Negative for fever.  HENT: Negative for congestion.   Eyes: Negative for redness.  Respiratory: Negative for shortness of breath.   Gastrointestinal: Negative for abdominal distention, nausea and vomiting.  Genitourinary: Negative for dysuria.  Musculoskeletal: Negative for back pain.  Skin: Positive for wound.  Allergic/Immunologic: Positive for immunocompromised state.  Neurological: Positive for numbness. Negative for headaches.  Hematological: Does not bruise/bleed easily.  Psychiatric/Behavioral: Negative for confusion.     Physical Exam Updated Vital Signs BP (!) 148/72   Pulse 91   Temp 98.5 F (36.9 C) (Oral)   Resp 18   Ht 1.6 m (5\' 3" )   Wt 80.7 kg (178 lb)   SpO2 98%   BMI 31.53 kg/m   Physical Exam  Constitutional: She is  oriented to person, place, and time. She appears well-developed and well-nourished.  HENT:  Head: Normocephalic and atraumatic.  Mouth/Throat: Oropharynx is clear and moist.  Eyes: Conjunctivae and EOM are normal. Pupils are equal, round, and reactive to light.  Neck: Normal range of motion. Neck supple.  Cardiovascular: Normal rate.  Pulmonary/Chest: Effort normal and breath sounds normal.  Abdominal: Soft. Bowel sounds are normal.  Musculoskeletal: Normal range of motion. She exhibits edema.  Neurological: She is alert and oriented to person, place, and time. A sensory deficit is present. No cranial nerve deficit. She exhibits normal muscle tone. Coordination normal.  Skin: Skin is warm. Capillary refill takes less than 2 seconds.  Nursing note and vitals reviewed.    ED Treatments / Results  Labs (all labs ordered are listed, but only abnormal results are displayed) Labs Reviewed  CBC WITH DIFFERENTIAL/PLATELET - Abnormal; Notable for the following components:      Result Value   WBC 11.0 (*)    Neutro Abs 9.3 (*)    All other components within normal limits  CBG MONITORING, ED - Abnormal; Notable for the following components:   Glucose-Capillary 217 (*)    All other components within normal limits  CULTURE, BLOOD (ROUTINE X 2)  CULTURE, BLOOD (ROUTINE X 2)  LACTIC ACID, PLASMA  COMPREHENSIVE METABOLIC PANEL  CBG MONITORING, ED    EKG  EKG Interpretation None       Radiology Dg Foot Complete Right  Result Date: 05/03/2017 CLINICAL DATA:  Acute onset of right foot erythema and swelling, particularly about the great toe. Chronic right foot pain. EXAM: RIGHT FOOT COMPLETE - 3+ VIEW COMPARISON:  None. FINDINGS: Scattered soft tissue air is noted along the medial and plantar aspects of the great toe, at the level of the first metatarsophalangeal joint, concerning for infection with a gas producing organism. Necrotizing fasciitis cannot be excluded. No definite osseous  erosions are seen. Visualized joint spaces are grossly preserved. Scattered vascular calcifications are noted. The subtalar joint is unremarkable. There is no evidence of fracture or dislocation. IMPRESSION: Soft tissue air along the medial and plantar aspects of the great toe, at the level of the first metatarsophalangeal joint, concerning for infection with a gas producing organism. Necrotizing fasciitis cannot be excluded. No definite osseous erosions seen. These results were called by telephone at the time of interpretation on 05/03/2017 at 9:22 pm to Dr. Rogene Houston, who verbally acknowledged these results. Electronically Signed   By: Garald Balding M.D.   On: 05/03/2017 21:23    Procedures Procedures (including critical care time)  Medications Ordered in ED Medications  0.9 %  sodium chloride infusion (not administered)  vancomycin (VANCOCIN) IVPB 1000 mg/200  mL premix (1,000 mg Intravenous New Bag/Given 05/03/17 2222)  sodium chloride 0.9 % bolus 250 mL (250 mLs Intravenous New Bag/Given 05/03/17 2222)     Initial Impression / Assessment and Plan / ED Course  I have reviewed the triage vital signs and the nursing notes.  Pertinent labs & imaging results that were available during my care of the patient were reviewed by me and considered in my medical decision making (see chart for details).     Patient's right foot with cellulitis up to the midportion of the right leg.  Involving the foot.  Good cap refill.  Less than 2 seconds.  Increased warmth.  Wound to the bottom at the base of the first metatarsal.  X-ray shows some evidence of some gas or free air in that area.  No significant crepitance.  Patient does not appear toxic.  No evidence of osteomyelitis.  Patient has a history of neuropathy.  Probably explains why she does not have much pain due to the foot cellulitis and infection.  Blood sugar reasonably controlled here.  Slight leukocytosis.  Blood culture sent.  Lactic acid  still pending.  Patient started on vancomycin 1 g.  Patient will require admission.  Lactic acid still pending.  Basic chemistries without significant abnormalities other than blood sugar being in the 200s.  No evidence of acidosis.  Slight leukocytosis.  Discussed with hospitalist they will admit.  Final Clinical Impressions(s) / ED Diagnoses   Final diagnoses:  Right foot infection  Diabetic foot infection [facility])    ED Discharge Orders    None       Fredia Sorrow, MD 05/03/17 2234

## 2017-05-03 NOTE — ED Triage Notes (Signed)
Pt with blister/wound between toes, one since for a week. Pt with hx of diabetes.  Pt has not checked sugar in 2 weeks.

## 2017-05-04 ENCOUNTER — Other Ambulatory Visit: Payer: Self-pay

## 2017-05-04 ENCOUNTER — Encounter (HOSPITAL_COMMUNITY): Payer: Self-pay | Admitting: *Deleted

## 2017-05-04 LAB — GLUCOSE, CAPILLARY
GLUCOSE-CAPILLARY: 127 mg/dL — AB (ref 65–99)
GLUCOSE-CAPILLARY: 126 mg/dL — AB (ref 65–99)
Glucose-Capillary: 96 mg/dL (ref 65–99)
Glucose-Capillary: 112 mg/dL — ABNORMAL HIGH (ref 65–99)

## 2017-05-04 LAB — CBC WITH DIFFERENTIAL/PLATELET
Basophils Absolute: 0 10*3/uL (ref 0.0–0.1)
Basophils Relative: 0 %
Eosinophils Absolute: 0.1 10*3/uL (ref 0.0–0.7)
Eosinophils Relative: 1 %
HEMATOCRIT: 35.1 % — AB (ref 36.0–46.0)
HEMOGLOBIN: 11.1 g/dL — AB (ref 12.0–15.0)
LYMPHS ABS: 1.4 10*3/uL (ref 0.7–4.0)
LYMPHS PCT: 15 %
MCH: 27.5 pg (ref 26.0–34.0)
MCHC: 31.6 g/dL (ref 30.0–36.0)
MCV: 87.1 fL (ref 78.0–100.0)
Monocytes Absolute: 1 10*3/uL (ref 0.1–1.0)
Monocytes Relative: 10 %
NEUTROS ABS: 7.1 10*3/uL (ref 1.7–7.7)
NEUTROS PCT: 74 %
Platelets: 150 10*3/uL (ref 150–400)
RBC: 4.03 MIL/uL (ref 3.87–5.11)
RDW: 14.6 % (ref 11.5–15.5)
WBC: 9.7 10*3/uL (ref 4.0–10.5)

## 2017-05-04 LAB — BASIC METABOLIC PANEL
Anion gap: 9 (ref 5–15)
BUN: 13 mg/dL (ref 6–20)
CHLORIDE: 104 mmol/L (ref 101–111)
CO2: 23 mmol/L (ref 22–32)
Calcium: 8.6 mg/dL — ABNORMAL LOW (ref 8.9–10.3)
Creatinine, Ser: 0.71 mg/dL (ref 0.44–1.00)
GFR calc non Af Amer: >60 mL/min (ref >60)
Glucose, Bld: 120 mg/dL — ABNORMAL HIGH (ref 65–99)
POTASSIUM: 3.8 mmol/L (ref 3.5–5.1)
SODIUM: 136 mmol/L (ref 135–145)

## 2017-05-04 LAB — HEMOGLOBIN A1C
Hgb A1c MFr Bld: 6.2 % — ABNORMAL HIGH (ref 4.8–5.6)
Mean Plasma Glucose: 131.24 mg/dL

## 2017-05-04 LAB — PREALBUMIN: Prealbumin: 13.1 mg/dL — ABNORMAL LOW (ref 18–38)

## 2017-05-04 LAB — C-REACTIVE PROTEIN: CRP: 23.6 mg/dL — ABNORMAL HIGH (ref <1.0)

## 2017-05-04 LAB — SEDIMENTATION RATE: Sed Rate: 48 mm/hr — ABNORMAL HIGH (ref 0–22)

## 2017-05-04 MED ORDER — VANCOMYCIN HCL IN DEXTROSE 1-5 GM/200ML-% IV SOLN
1000.0000 mg | INTRAVENOUS | Status: DC
  Administered 2017-05-04 – 2017-05-07 (×4): 1000 mg via INTRAVENOUS
  Filled 2017-05-04 (×4): qty 200

## 2017-05-04 NOTE — Progress Notes (Signed)
PROGRESS NOTE  Jessica Russell  CWC:[id number]  DOB: October 25, 1927  DOA: 05/03/2017 PCP: Raylene Everts, MD  Brief Admission Hx: Jessica Russell is a 83 y.o. female with medical history significant for type 2 diabetes mellitus and peripheral neuropathy, now presenting to the emergency department for evaluation of a right foot ulcer with purulent drainage and surrounding erythema.   MDM/Assessment & Plan:   1. Diabetic foot ulcer with infection  - Presents with at least 1 week of blistering and ulceration at plantar aspect right foot overlying first MTP with recent purulent drainage and progression of erythema up the leg  - Afebrile and hemodynamically stable on admission with mild leukocytosis  - Radiographs reveal gas, likely from overlying ulcer/open wound; she does not appears toxic; no definite osseous erosion on plain films   - Blood cultures were collected in ED and empiric vancomycin administered - Checking ABI and inflammatory markers, continue empiric treatment with Rocephin, Flagyl, vancomycin, consult with wound-care and try to obtain podiatry consult.  -Diabetic peripheral polyneuropathy with insensate feet - pt is at high risk for amputation and counseled her on proper foot care and she will need to establish with a regular podiatrist. She says she has an appointment on 05/14/17.  -MRI of right foot ordered to more closely evaluate for osteomyelitis.  2. Type 2 DM  - A1c 6.2% - Managed with metformin only at home  - Complicated by neuropathy with foot ulcer as above  - Check CBG's, start a SSI with Novolog    3. Hyponatremia  - Serum sodium is 132 on admission in setting of hypovolemia  - Treated with 250 cc NS in ED and continued on IVF hydration - Resolved now    DVT prophylaxis: Lovenox Code Status: Full  Family Communication: Husband and son updated at bedside Disposition Plan: Admit to med-surg Consults called: None  Subjective: Pt without complaints.    Objective: Vitals:   05/04/17 0015 05/04/17 0106 05/04/17 0700 05/04/17 1300  BP: (!) 139/59 (!) 132/55 (!) 108/46 (!) 113/59  Pulse: 87 80 75 76  Resp: 19 20 18 20   Temp: 98.9 F (37.2 C) 98.8 F (37.1 C) 98.5 F (36.9 C) 98.4 F (36.9 C)  TempSrc: Oral Oral Oral Oral  SpO2: 96% 94% 94% 98%  Weight:      Height:        Intake/Output Summary (Last 24 hours) at 05/04/2017 1520 Last data filed at 05/04/2017 1500 Gross per 24 hour  Intake 1446.66 ml  Output -  Net 1446.66 ml   Filed Weights   05/03/17 2037  Weight: 80.7 kg (178 lb)     REVIEW OF SYSTEMS  As per history otherwise all reviewed and reported negative  Exam:  General exam: awake, alert, NAD. Cooperative.  Respiratory system: Clear. No increased work of breathing. Cardiovascular system: S1 & S2 heard, RRR. No JVD, murmurs, gallops, clicks or pedal edema. Gastrointestinal system: Abdomen is nondistended, soft and nontender. Normal bowel sounds heard. Central nervous system: Alert and oriented. No focal neurological deficits. Extremities: insensate feet, ulcer at plantar aspect right foot at level of 1st MTP with fluctuance, purulent discharge expressed; erythema and edema extends proximally to ankle  Data Reviewed: Basic Metabolic Panel: Recent Labs  Lab 05/03/17 2140 05/04/17 0558  NA 132* 136  K 3.8 3.8  CL 99* 104  CO2 22 23  GLUCOSE 212* 120*  BUN 18 13  CREATININE 0.84 0.71  CALCIUM 8.9 8.6*   Liver Function Tests: Recent Labs  Lab 05/03/17 2140  AST 24  ALT 18  ALKPHOS 74  BILITOT 0.9  PROT 7.2  ALBUMIN 3.6   No results for input(s): LIPASE, AMYLASE in the last 168 hours. No results for input(s): AMMONIA in the last 168 hours. CBC: Recent Labs  Lab 05/03/17 2140 05/04/17 0558  WBC 11.0* 9.7  NEUTROABS 9.3* 7.1  HGB 12.4 11.1*  HCT 38.5 35.1*  MCV 86.9 87.1  PLT 171 150   Cardiac Enzymes: No results for input(s): CKTOTAL, CKMB, CKMBINDEX, TROPONINI in the last 168  hours. CBG (last 3)  Recent Labs    05/03/17 2137 05/04/17 0112 05/04/17 0743  GLUCAP 217* 127* 126*   Recent Results (from the past 240 hour(s))  Culture, blood (Routine X 2) w Reflex to ID Panel     Status: None (Preliminary result)   Collection Time: 05/03/17 10:02 PM  Result Value Ref Range Status   Specimen Description BLOOD LEFT ARM  Final   Special Requests   Final    BOTTLES DRAWN AEROBIC AND ANAEROBIC Blood Culture adequate volume   Culture NO GROWTH < 12 HOURS  Final   Report Status PENDING  Incomplete  Culture, blood (Routine X 2) w Reflex to ID Panel     Status: None (Preliminary result)   Collection Time: 05/03/17 10:05 PM  Result Value Ref Range Status   Specimen Description BLOOD LEFT HAND  Final   Special Requests   Final    BOTTLES DRAWN AEROBIC AND ANAEROBIC Blood Culture results may not be optimal due to an inadequate volume of blood received in culture bottles   Culture NO GROWTH < 12 HOURS  Final   Report Status PENDING  Incomplete     Studies: Dg Foot Complete Right  Result Date: 05/03/2017 CLINICAL DATA:  Acute onset of right foot erythema and swelling, particularly about the great toe. Chronic right foot pain. EXAM: RIGHT FOOT COMPLETE - 3+ VIEW COMPARISON:  None. FINDINGS: Scattered soft tissue air is noted along the medial and plantar aspects of the great toe, at the level of the first metatarsophalangeal joint, concerning for infection with a gas producing organism. Necrotizing fasciitis cannot be excluded. No definite osseous erosions are seen. Visualized joint spaces are grossly preserved. Scattered vascular calcifications are noted. The subtalar joint is unremarkable. There is no evidence of fracture or dislocation. IMPRESSION: Soft tissue air along the medial and plantar aspects of the great toe, at the level of the first metatarsophalangeal joint, concerning for infection with a gas producing organism. Necrotizing fasciitis cannot be excluded. No  definite osseous erosions seen. These results were called by telephone at the time of interpretation on 05/03/2017 at 9:22 pm to Dr. Rogene Houston, who verbally acknowledged these results. Electronically Signed   By: Garald Balding M.D.   On: 05/03/2017 21:23   Scheduled Meds: . enoxaparin (LOVENOX) injection  40 mg Subcutaneous Q24H  . gabapentin  800 mg Oral QID  . insulin aspart  0-5 Units Subcutaneous QHS  . insulin aspart  0-9 Units Subcutaneous TID WC  . multivitamin with minerals  1 tablet Oral Daily   Continuous Infusions: . cefTRIAXone (ROCEPHIN)  IV Stopped (05/04/17 0333)  . metronidazole 500 mg (05/04/17 1429)  . vancomycin     Principal Problem:   Cellulitis Active Problems:   Diabetic neuropathy associated with type 2 diabetes mellitus ([place])   Diabetes mellitus type II, non insulin dependent ([place])   Diabetic foot ulcer ([place])  Time spent:   Irwin Brakeman, MD,  FAAFP [facility] Pager [phone]  If 7PM-7AM, please contact night-coverage [email or web address] Password [email or web address] 05/04/2017, 3:20 PM    LOS: 1 day

## 2017-05-04 NOTE — Progress Notes (Signed)
Pharmacy Antibiotic Note  Jessica Russell is a 83 y.o. female admitted on 05/03/2017 with right foot ulcer.  Pharmacy has been consulted for vancomycin dosing. Vancomycin 1gm given last night  Plan: Continue vancomycin 1gm IV q24 hours F/u renal function, cultures and clinical course  Height: 5\' 3"  (160 cm) Weight: 178 lb (80.7 kg) IBW/kg (Calculated) : 52.4  Temp (24hrs), Avg:98.7 F (37.1 C), Min:98.5 F (36.9 C), Max:98.9 F (37.2 C)  Recent Labs  Lab 05/03/17 2140 05/03/17 2202 05/04/17 0558  WBC 11.0*  --  9.7  CREATININE 0.84  --  0.71  LATICACIDVEN  --  2.3*  --     Estimated Creatinine Clearance: 49.8 mL/min (by C-G formula based on SCr of 0.71 mg/dL).    Allergies  Allergen Reactions  . Phenergan [Promethazine Hcl] Other (See Comments)    Made delerious    Antimicrobials this admission: vanc 12/24  >>  rocephin 12/24 >>   Microbiology results: 12/24 BCx: pending  Thank you for allowing pharmacy to be a part of this patient's care.  Excell Seltzer [place] 05/04/2017 8:49 AM

## 2017-05-05 ENCOUNTER — Inpatient Hospital Stay (HOSPITAL_COMMUNITY): Payer: Medicare Other

## 2017-05-05 DIAGNOSIS — E11628 Type 2 diabetes mellitus with other skin complications: Secondary | ICD-10-CM

## 2017-05-05 DIAGNOSIS — L089 Local infection of the skin and subcutaneous tissue, unspecified: Secondary | ICD-10-CM

## 2017-05-05 DIAGNOSIS — L02611 Cutaneous abscess of right foot: Secondary | ICD-10-CM

## 2017-05-05 LAB — GLUCOSE, CAPILLARY
GLUCOSE-CAPILLARY: 106 mg/dL — AB (ref 65–99)
Glucose-Capillary: 107 mg/dL — ABNORMAL HIGH (ref 65–99)
Glucose-Capillary: 173 mg/dL — ABNORMAL HIGH (ref 65–99)
Glucose-Capillary: 183 mg/dL — ABNORMAL HIGH (ref 65–99)
Glucose-Capillary: 120 mg/dL — ABNORMAL HIGH (ref 65–99)

## 2017-05-05 MED ORDER — PRO-STAT SUGAR FREE PO LIQD
30.0000 mL | Freq: Two times a day (BID) | ORAL | Status: DC
  Administered 2017-05-05 – 2017-05-09 (×9): 30 mL via ORAL
  Filled 2017-05-05 (×9): qty 30

## 2017-05-05 MED ORDER — JUVEN PO PACK
1.0000 | PACK | Freq: Two times a day (BID) | ORAL | Status: DC
  Administered 2017-05-05 – 2017-05-09 (×9): 1 via ORAL
  Filled 2017-05-05 (×11): qty 1

## 2017-05-05 NOTE — Progress Notes (Signed)
Initial Nutrition Assessment  DOCUMENTATION CODES:      INTERVENTION:  ProStat 30 ml BID (each 30 ml provides 100 kcal, 15 gr protein)   Juven BID daily   NUTRITION DIAGNOSIS:   Increased nutrient needs related to wound healing as evidenced by estimated needs.  GOAL:   Patient will meet greater than or equal to 90% of their needs  MONITOR:   PO intake, Supplement acceptance, Skin, Weight trends, Labs   REASON FOR ASSESSMENT:   Consult Wound healing  ASSESSMENT:   83 yo female with hx of DM type 2, peripheral neuropathy, Diabetic foot ulcer (infection), GERD, HLD, breast cancer.  CRP-23.6 and lactic acid 2.3. Ultrasound lower extremity- ABI- normal. MRI right foot- identified cellulitis (first MTP joint) with abscess. Negative for osteomyelitis.   Home diet is CHO modified. Her appetite is fair. She lives with husband and pt says they moved here in October and since have been trying to get settled in their house and prepare for Christmas and she has not been eating at regular intervals. She likes oatmeal, toast juice and milk for breakfast and she frequently skips mid day meal and eats early dinner around 4 pm. We talked about the importance of consistent cho intake as well as adequate protein to promote wound healing. Also identified good quality sources of protein to include daily.  Her weight hx is stable.    Recent Labs  Lab 05/03/17 2140 05/04/17 0558  NA 132* 136  K 3.8 3.8  CL 99* 104  CO2 22 23  BUN 18 13  CREATININE 0.84 0.71  CALCIUM 8.9 8.6*  GLUCOSE 212* 120*  Labs: glu 120. CRP- 23.6 and Lactic acid 2.3. PreAlbumin 13.1.  Her latest A1C-6.2% -within goal for age   NUTRITION - FOCUSED PHYSICAL EXAM: normal     Diet Order:  Diet Carb Modified Fluid consistency: Thin; Room service appropriate? Yes  EDUCATION NEEDS:   Skin:  Skin Assessment: Skin Integrity Issues: Skin Integrity Issues:: Unstageable Unstageable: 2 unstagable wound to right  foot  Last BM:  12/24  Height:   Ht Readings from Last 1 Encounters:  05/03/17 5\' 3"  (1.6 m)    Weight:   Wt Readings from Last 1 Encounters:  05/03/17 178 lb (80.7 kg)    Ideal Body Weight:  52 kg  BMI:  Body mass index is 31.53 kg/m.  Estimated Nutritional Needs:   Kcal:  1287-8676  Protein:  95-100 gm   Fluid:  1.6-1.8 liters daily   Colman Cater MS,RD,CSG,LDN Office: [phone] Pager: [phone]

## 2017-05-05 NOTE — Progress Notes (Signed)
[facility]  Full consult to follow.  Diabetic foot ulcer with abscess. Bedside I&D performed and patient tolerated well. Will do BID dressing for the next few days and determine if need further debridement. IV antibiotics.  Culture sent for the fluid of right foot abscess.   Curlene Labrum, MD [facility] [address] [place], [place] [address] [phone] (office)

## 2017-05-05 NOTE — Evaluation (Signed)
Physical Therapy Evaluation Patient Details Name: Jessica Russell MRN: [id number] DOB: 12-10-1927 Today's Date: 05/05/2017   History of Present Illness  Jessica Russell is a 83 y.o. female with medical history significant for type 2 diabetes mellitus and peripheral neuropathy, now presenting to the emergency department for evaluation of a right foot ulcer with purulent drainage and surrounding erythema.  The patient reports that she had been in her usual state of health, unable to feel her bilateral feet secondary to neuropathy, noted blistering and a wound at the plantar aspect of her right foot between the first 2 toes approximately 1 week ago.  Since that time, she reports purulent drainage from the site and also has noted increasing erythema, now progressing proximally up her leg.  Denies fevers or chills and has otherwise been well  Clinical Impression  Pt received lying in bed and was agreeable to PT evaluation. Pt from home with her husband where she is independent at baseline with all ADLs, IADLs, and ambulation without AD. Pt has cane and walker at home. Today, pt mod I for transfers and mod I - supervision for ambulation with and without AD. Pt able to ambulate 176ft without AD with supervision but due to R foot pain during gait, pt wished to use the RW and was able to continue for another 639ft mod I. At this time, pt verbalizes and demonstrates that she is at [facility]. PT encouraged pt to use her RW or cane once home for pain management during gait and she and her husband verbalized understanding. Pt at baseline level of function and will not need any f/u PT once safe for d/c. PT signing off acutely for now.     Follow Up Recommendations No PT follow up    Equipment Recommendations  None recommended by PT    Recommendations for Other Services       Precautions / Restrictions Precautions Precautions: None Precaution Comments: no falls in the last 6 months; last fall was in January 2018  when she tripped over a cord and broke her knee cap; pt does verbalize some balance issues due to her neuropahty Restrictions Weight Bearing Restrictions: No      Mobility  Bed Mobility               General bed mobility comments: n/a - received in and returned to chair  Transfers Overall transfer level: Modified independent Equipment used: None                Ambulation/Gait Ambulation/Gait assistance: Modified independent (Device/Increase time);Supervision Ambulation Distance (Feet): 700 Feet Assistive device: Rolling walker (2 wheeled) Gait Pattern/deviations: WFL(Within Functional Limits);Antalgic Gait velocity: good speed and cadeence throughout Gait velocity interpretation: at or above normal speed for age/gender General Gait Details: mild antalgia noted when standing on RLE during gait; pt ambulated first 160ft with no AD with supervision and then asked to use the RW for pain mangement of the R foot. Pt ambulated another 632ft with RW mod I  Stairs            Wheelchair Mobility    Modified Rankin (Stroke Patients Only)       Balance Overall balance assessment: Modified Independent                                           Pertinent Vitals/Pain Pain Assessment: 0-10 Pain Score: 3  Pain  Descriptors / Indicators: (neuropathy pain) Pain Intervention(s): Limited activity within patient's tolerance;Monitored during session;Repositioned    Home Living Family/patient expects to be discharged to:: Private residence Living Arrangements: Spouse/significant other   Type of Home: House Home Access: Stairs to enter Entrance Stairs-Rails: None Entrance Stairs-Number of Steps: 1 STE from garage Home Layout: One level Home Equipment: Grab bars - tub/shower;Cane - single point;Walker - 2 wheels;Shower seat      Prior Function Level of Independence: Independent         Comments: independent with all ADLs, IADLs; able to ambulate  unlimited community distances     Hand Dominance   Dominant Hand: Right    Extremity/Trunk Assessment   Upper Extremity Assessment Upper Extremity Assessment: Overall WFL for tasks assessed    Lower Extremity Assessment Lower Extremity Assessment: Overall WFL for tasks assessed    Cervical / Trunk Assessment Cervical / Trunk Assessment: Normal  Communication   Communication: No difficulties  Cognition Arousal/Alertness: Awake/alert Behavior During Therapy: WFL for tasks assessed/performed Overall Cognitive Status: Within Functional Limits for tasks assessed                                        General Comments      Exercises     Assessment/Plan    PT Assessment Patent does not need any further PT services  PT Problem List         PT Treatment Interventions      PT Goals (Current goals can be found in the Care Plan section)  Acute Rehab PT Goals Patient Stated Goal: home PT Goal Formulation: With patient/family Time For Goal Achievement: 05/12/17 Potential to Achieve Goals: Good    Frequency     Barriers to discharge        Co-evaluation               AM-PAC PT "6 Clicks" Daily Activity  Outcome Measure Difficulty turning over in bed (including adjusting bedclothes, sheets and blankets)?: None Difficulty moving from lying on back to sitting on the side of the bed? : None Difficulty sitting down on and standing up from a chair with arms (e.g., wheelchair, bedside commode, etc,.)?: None Help needed moving to and from a bed to chair (including a wheelchair)?: None Help needed walking in hospital room?: None Help needed climbing 3-5 steps with a railing? : A Little 6 Click Score: 23    End of Session Equipment Utilized During Treatment: Gait belt Activity Tolerance: Patient tolerated treatment well Patient left: in chair;with family/visitor present Nurse Communication: Mobility status(mobility sheet left in room) PT Visit  Diagnosis: Difficulty in walking, not elsewhere classified (R26.2);Other abnormalities of gait and mobility (R26.89);Unsteadiness on feet (R26.81)    Time: 9811-9147 PT Time Calculation (min) (ACUTE ONLY): 19 min   Charges:   PT Evaluation $PT Eval Low Complexity: 1 Low     PT G Codes:   PT G-Codes **NOT FOR INPATIENT CLASS** Functional Assessment Tool Used: AM-PAC 6 Clicks Basic Mobility;Clinical judgement Functional Limitation: Mobility: Walking and moving around Mobility: Walking and Moving Around Current Status (W2956): At least 1 percent but less than 20 percent impaired, limited or restricted Mobility: Walking and Moving Around Goal Status [phone]): At least 1 percent but less than 20 percent impaired, limited or restricted Mobility: Walking and Moving Around Discharge Status [phone]): At least 1 percent but less than 20 percent impaired, limited  or restricted       Geraldine Solar PT, DPT

## 2017-05-05 NOTE — Progress Notes (Signed)
PROGRESS NOTE  Jessica Russell  ERX:[id number]  DOB: [birth year]  DOA: 05/03/2017 PCP: Jessica Everts, MD  Brief Admission Hx: Jessica Russell is a 83 y.o. female with medical history significant for type 2 diabetes mellitus and peripheral neuropathy, now presenting to the emergency department for evaluation of a right foot ulcer with purulent drainage and surrounding erythema.   MDM/Assessment & Plan:   1. Diabetic foot ulcer with infection  - Presented with at least 1 week of blistering and ulceration at plantar aspect right foot overlying first MTP with recent purulent drainage and progression of erythema up the leg  - Afebrile and hemodynamically stable on admission with mild leukocytosis  - Radiographs reveal gas, likely from overlying ulcer/open wound; she does not appears toxic; no definite osseous erosion on plain films   - Blood cultures were collected in ED and empiric vancomycin administered - Checking ABI and inflammatory markers, continue empiric treatment with Rocephin, Flagyl, vancomycin, consult with wound-care and try to obtain podiatry consult.  -Diabetic peripheral polyneuropathy with insensate feet - pt is at high risk for amputation and counseled her on proper foot care and she will need to establish with a regular podiatrist. She says she has an appointment on 05/14/17.  -MRI of right foot ordered to more closely evaluate for osteomyelitis.  2. Type 2 DM  - A1c 6.2% - Managed with metformin only at home  - Complicated by neuropathy with foot ulcer as above  - Check CBG's, start a SSI with Novolog    CBG (last 3)  Recent Labs    05/04/17 0743 05/04/17 1636 05/04/17 2213  GLUCAP 126* 96 112*    3. Hyponatremia  - Serum sodium is 132 on admission in setting of hypovolemia  - Treated with 250 cc NS in ED and continued on IVF hydration - Resolved now    DVT prophylaxis: Lovenox Code Status: Full  Family Communication: Husband and son updated at  bedside Disposition Plan: Admit to med-surg Consults called: podiatry, wound care   Subjective: Pt reports a new blister developed and ongoing drainage from the foot wound   Objective: Vitals:   05/04/17 0700 05/04/17 1300 05/04/17 2214 05/05/17 0517  BP: (!) 108/46 (!) 113/59 (!) 146/60 (!) 123/54  Pulse: 75 76 74 74  Resp: 18 20 20 14   Temp: 98.5 F (36.9 C) 98.4 F (36.9 C) 97.8 F (36.6 C) 98.9 F (37.2 C)  TempSrc: Oral Oral    SpO2: 94% 98% 100% 95%  Weight:      Height:        Intake/Output Summary (Last 24 hours) at 05/05/2017 0759 Last data filed at 05/05/2017 0300 Gross per 24 hour  Intake 790 ml  Output -  Net 790 ml   Filed Weights   05/03/17 2037  Weight: 80.7 kg (178 lb)     REVIEW OF SYSTEMS  As per history otherwise all reviewed and reported negative  Exam:  General exam: awake, alert, NAD. Cooperative.  Respiratory system: Clear. No increased work of breathing. Cardiovascular system: S1 & S2 heard, RRR. No JVD, murmurs, gallops, clicks or pedal edema. Gastrointestinal system: Abdomen is nondistended, soft and nontender. Normal bowel sounds heard. Central nervous system: Alert and oriented. No focal neurological deficits. Extremities: insensate feet, ulcer at plantar aspect right foot at level of 1st MTP with fluctuance, purulent discharge expressed; erythema and edema extends proximally to ankle        Data Reviewed: Basic Metabolic Panel: Recent Labs  Lab 05/03/17  2140 05/04/17 0558  NA 132* 136  K 3.8 3.8  CL 99* 104  CO2 22 23  GLUCOSE 212* 120*  BUN 18 13  CREATININE 0.84 0.71  CALCIUM 8.9 8.6*   Liver Function Tests: Recent Labs  Lab 05/03/17 2140  AST 24  ALT 18  ALKPHOS 74  BILITOT 0.9  PROT 7.2  ALBUMIN 3.6   No results for input(s): LIPASE, AMYLASE in the last 168 hours. No results for input(s): AMMONIA in the last 168 hours. CBC: Recent Labs  Lab 05/03/17 2140 05/04/17 0558  WBC 11.0* 9.7  NEUTROABS  9.3* 7.1  HGB 12.4 11.1*  HCT 38.5 35.1*  MCV 86.9 87.1  PLT 171 150   Cardiac Enzymes: No results for input(s): CKTOTAL, CKMB, CKMBINDEX, TROPONINI in the last 168 hours. CBG (last 3)  Recent Labs    05/04/17 0743 05/04/17 1636 05/04/17 2213  GLUCAP 126* 96 112*   Recent Results (from the past 240 hour(s))  Culture, blood (Routine X 2) w Reflex to ID Panel     Status: None (Preliminary result)   Collection Time: 05/03/17 10:02 PM  Result Value Ref Range Status   Specimen Description BLOOD LEFT ARM  Final   Special Requests   Final    BOTTLES DRAWN AEROBIC AND ANAEROBIC Blood Culture adequate volume   Culture NO GROWTH 2 DAYS  Final   Report Status PENDING  Incomplete  Culture, blood (Routine X 2) w Reflex to ID Panel     Status: None (Preliminary result)   Collection Time: 05/03/17 10:05 PM  Result Value Ref Range Status   Specimen Description BLOOD LEFT HAND  Final   Special Requests   Final    BOTTLES DRAWN AEROBIC AND ANAEROBIC Blood Culture results may not be optimal due to an inadequate volume of blood received in culture bottles   Culture NO GROWTH 2 DAYS  Final   Report Status PENDING  Incomplete     Studies: Dg Foot Complete Right  Result Date: 05/03/2017 CLINICAL DATA:  Acute onset of right foot erythema and swelling, particularly about the great toe. Chronic right foot pain. EXAM: RIGHT FOOT COMPLETE - 3+ VIEW COMPARISON:  None. FINDINGS: Scattered soft tissue air is noted along the medial and plantar aspects of the great toe, at the level of the first metatarsophalangeal joint, concerning for infection with a gas producing organism. Necrotizing fasciitis cannot be excluded. No definite osseous erosions are seen. Visualized joint spaces are grossly preserved. Scattered vascular calcifications are noted. The subtalar joint is unremarkable. There is no evidence of fracture or dislocation. IMPRESSION: Soft tissue air along the medial and plantar aspects of the great  toe, at the level of the first metatarsophalangeal joint, concerning for infection with a gas producing organism. Necrotizing fasciitis cannot be excluded. No definite osseous erosions seen. These results were called by telephone at the time of interpretation on 05/03/2017 at 9:22 pm to Dr. Rogene Houston, who verbally acknowledged these results. Electronically Signed   By: Garald Balding M.D.   On: 05/03/2017 21:23   Scheduled Meds: . enoxaparin (LOVENOX) injection  40 mg Subcutaneous Q24H  . gabapentin  800 mg Oral QID  . insulin aspart  0-5 Units Subcutaneous QHS  . insulin aspart  0-9 Units Subcutaneous TID WC  . multivitamin with minerals  1 tablet Oral Daily   Continuous Infusions: . cefTRIAXone (ROCEPHIN)  IV Stopped (05/04/17 2336)  . metronidazole Stopped (05/05/17 0740)  . vancomycin Stopped (05/04/17 1855)   Principal  Problem:   Cellulitis Active Problems:   Diabetic neuropathy associated with type 2 diabetes mellitus ([facility])   Diabetes mellitus type II, non insulin dependent ([place])   Diabetic foot ulcer ([name])  Time spent:   Irwin Brakeman, MD, FAAFP [facility] Pager [phone]  If 7PM-7AM, please contact night-coverage [email or web address] Password [email or web address] 05/05/2017, 7:59 AM    LOS: 2 days

## 2017-05-05 NOTE — Care Management Note (Addendum)
Case Management Note  Patient Details  Name: Jessica Russell MRN: [id number] Date of Birth: [birth year]  Subjective/Objective:              Admitted with cellulitis. Pt's husband at bedside. Pt from home, lives with husband. She is ind at baseline. She has moved from [place], [place] in the recent past, she has her first appointment with new PCP on 05/14/17 and first appointment with new podiatrist on 05/13/16. She reports doing wound care prior to admission. She manages her own medications, says she only takes Neurontin and metformin. Reports NON-compliance with checking blood sugars regularly. She plans to do her own wound care after DC.       Action/Plan: DC home with self care. CM will follow peripherlly to DC, may be re-consulted if needs arise.   Expected Discharge Date:     05/06/2017             Expected Discharge Plan:  Home/Self Care  In-House Referral:  NA  Discharge planning Services  CM Consult  Post Acute Care Choice:  NA Choice offered to:  NA  Status of Service:  Completed, signed off  Sherald Barge, RN 05/05/2017, 12:54 PM

## 2017-05-05 NOTE — Consult Note (Signed)
[facility] Consult  Reason for Consult: Right foot diabetic foot ulcer with abscess  Referring Physician: Dr. Wynetta Emery   Chief Complaint    Foot Swelling      Jessica Russell is a 83 y.o. female.  HPI: Jessica Russell is an 83 yo with diabetes, diabetic neuropathy who presented with a right diabetic foot infection and had an MRI that demonstrated an abscess at the 1st MTP joint on the right foot.  She was admitted to the hospital 12/24, and has been on IV antibiotics, but the area continued to worsen.  She had the infection for about 1 week prior to coming to the hospital.    MRI did not show signs of osteomyelitis. ABI was wnl.   Past Medical History:  Diagnosis Date  . Allergy   . Anemia   . Arthritis   . Cancer ([place]) 1997, 2000   breast  . Cataract   . Diabetes mellitus without complication ([place])   . Diabetic nephropathy ([place])   . GERD (gastroesophageal reflux disease)   . HLD (hyperlipidemia) 03/24/2017  . Osteoporosis 04/14/2017    Past Surgical History:  Procedure Laterality Date  . APPENDECTOMY    . BILATERAL CARPAL TUNNEL RELEASE    . BREAST SURGERY    . 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 FRACTURE SURGERY     knee fracture  . HERNIA REPAIR     ventral    Family History  Problem Relation Age of Onset  . Diabetes Mother   . Heart disease Mother   . Arthritis Mother   . Hypertension Mother   . Miscarriages / [place] Mother   . Early death Father   . Stroke Father   . Alcohol abuse Father     Social History   Tobacco Use  . Smoking status: Former Smoker    Packs/day: 1.00    Types: Cigarettes    Start date: 05/11/1950    Last attempt to quit: 05/12/1963    Years since quitting: 54.0  . Smokeless tobacco: Never Used  Substance Use Topics  . Alcohol use: No    Frequency: Never  . Drug use: No    Medications:  I have reviewed the patient's current medications. Prior to Admission:  Medications Prior to  Admission  Medication Sig Dispense Refill Last Dose  . acetaminophen (TYLENOL) 500 MG tablet Take 500 mg by mouth every 6 (six) hours as needed for mild pain or moderate pain.   05/02/2017 at Unknown time  . gabapentin (NEURONTIN) 400 MG capsule Take 800 mg 4 (four) times daily by mouth.   05/03/2017 at Unknown time  . metFORMIN (GLUCOPHAGE) 500 MG tablet Take 500 mg by mouth daily after supper.    05/02/2017 at Unknown time  . Multiple Vitamin (MULTIVITAMIN) capsule Take 1 capsule daily by mouth.   05/02/2017 at Unknown time  . NON FORMULARY Take 2 capsules by mouth daily. [facility]   05/02/2017 at Unknown time   Scheduled: . enoxaparin (LOVENOX) injection  40 mg Subcutaneous Q24H  . feeding supplement (PRO-STAT SUGAR FREE 64)  30 mL Oral BID  . gabapentin  800 mg Oral QID  . insulin aspart  0-5 Units Subcutaneous QHS  . insulin aspart  0-9 Units Subcutaneous TID WC  . multivitamin with minerals  1 tablet Oral Daily  . nutrition supplement (JUVEN)  1 packet Oral BID BM   Continuous: . cefTRIAXone (ROCEPHIN)  IV Stopped (05/04/17 2336)  .  metronidazole Stopped (05/05/17 1610)  . vancomycin Stopped (05/05/17 1836)   ZMO:[id number] **OR** acetaminophen, bisacodyl, HYDROcodone-acetaminophen, ondansetron **OR** ondansetron (ZOFRAN) IV, senna-docusate  Allergies: Allergies  Allergen Reactions  . Phenergan [Promethazine Hcl] Other (See Comments)    Made delerious    ROS:  A comprehensive review of systems was negative except for: cellulitis and swelling of right foot and leg, ulcer on the plantar surface  Blood pressure (!) 123/54, pulse 74, temperature 98.9 F (37.2 C), resp. rate 14, height '5\' 3"'  (1.6 m), weight 178 lb (80.7 kg), SpO2 95 %. Physical Exam  Constitutional: She is oriented to person, place, and time and well-developed, well-nourished, and in no distress.  HENT:  Head: Normocephalic.  Eyes: Pupils are equal, round, and reactive to light.  Neck: Normal range of  motion.  Cardiovascular: Normal rate.  Pulmonary/Chest: Effort normal.  Abdominal: Soft. She exhibits no distension. There is no tenderness.  Musculoskeletal: She exhibits no edema.  Right foot with blood blister about 3cm, ulceration on plantar aspect of foot, no drainage, cellulitis to the ankle and on dorsum of the foot; right foot 2+ PT, 1+ DP  Neurological: She is alert and oriented to person, place, and time.  Skin: Skin is warm.  Psychiatric: Mood, memory, affect and judgment normal.  Vitals reviewed.  12/24 on admission:    S/p Bedside debridement:    Results: Results for orders placed or performed during the hospital encounter of 05/03/17 (from the past 48 hour(s))  CBG monitoring, ED     Status: Abnormal   Collection Time: 05/03/17  9:37 PM  Result Value Ref Range   Glucose-Capillary 217 (H) 65 - 99 mg/dL  CBC with Differential/Platelet     Status: Abnormal   Collection Time: 05/03/17  9:40 PM  Result Value Ref Range   WBC 11.0 (H) 4.0 - 10.5 K/uL   RBC 4.43 3.87 - 5.11 MIL/uL   Hemoglobin 12.4 12.0 - 15.0 g/dL   HCT 38.5 36.0 - 46.0 %   MCV 86.9 78.0 - 100.0 fL   MCH 28.0 26.0 - 34.0 pg   MCHC 32.2 30.0 - 36.0 g/dL   RDW 14.4 11.5 - 15.5 %   Platelets 171 150 - 400 K/uL   Neutrophils Relative % 86 %   Neutro Abs 9.3 (H) 1.7 - 7.7 K/uL   Lymphocytes Relative 7 %   Lymphs Abs 0.8 0.7 - 4.0 K/uL   Monocytes Relative 7 %   Monocytes Absolute 0.8 0.1 - 1.0 K/uL   Eosinophils Relative 0 %   Eosinophils Absolute 0.0 0.0 - 0.7 K/uL   Basophils Relative 0 %   Basophils Absolute 0.0 0.0 - 0.1 K/uL  Comprehensive metabolic panel     Status: Abnormal   Collection Time: 05/03/17  9:40 PM  Result Value Ref Range   Sodium 132 (L) 135 - 145 mmol/L   Potassium 3.8 3.5 - 5.1 mmol/L   Chloride 99 (L) 101 - 111 mmol/L   CO2 22 22 - 32 mmol/L   Glucose, Bld 212 (H) 65 - 99 mg/dL   BUN 18 6 - 20 mg/dL   Creatinine, Ser 0.84 0.44 - 1.00 mg/dL   Calcium 8.9 8.9 - 10.3 mg/dL    Total Protein 7.2 6.5 - 8.1 g/dL   Albumin 3.6 3.5 - 5.0 g/dL   AST 24 15 - 41 U/L   ALT 18 14 - 54 U/L   Alkaline Phosphatase 74 38 - 126 U/L   Total Bilirubin 0.9 0.3 -  1.2 mg/dL   GFR calc non Af Amer >60 >60 mL/min   GFR calc Af Amer >60 >60 mL/min    Comment: (NOTE) The eGFR has been calculated using the CKD EPI equation. This calculation has not been validated in all clinical situations. eGFR's persistently <60 mL/min signify possible Chronic Kidney Disease.    Anion gap 11 5 - 15  Culture, blood (Routine X 2) w Reflex to ID Panel     Status: None (Preliminary result)   Collection Time: 05/03/17 10:02 PM  Result Value Ref Range   Specimen Description BLOOD LEFT ARM    Special Requests      BOTTLES DRAWN AEROBIC AND ANAEROBIC Blood Culture adequate volume   Culture NO GROWTH 2 DAYS    Report Status PENDING   Lactic acid, plasma     Status: Abnormal   Collection Time: 05/03/17 10:02 PM  Result Value Ref Range   Lactic Acid, Venous 2.3 (HH) 0.5 - 1.9 mmol/L    Comment: CRITICAL RESULT CALLED TO, READ BACK BY AND VERIFIED WITH: DOSS,M AT 2250 ON 12.24.2018 BY ISLEY,B   Culture, blood (Routine X 2) w Reflex to ID Panel     Status: None (Preliminary result)   Collection Time: 05/03/17 10:05 PM  Result Value Ref Range   Specimen Description BLOOD LEFT HAND    Special Requests      BOTTLES DRAWN AEROBIC AND ANAEROBIC Blood Culture results may not be optimal due to an inadequate volume of blood received in culture bottles   Culture NO GROWTH 2 DAYS    Report Status PENDING   Hemoglobin A1c     Status: Abnormal   Collection Time: 05/03/17 10:05 PM  Result Value Ref Range   Hgb A1c MFr Bld 6.2 (H) 4.8 - 5.6 %    Comment: (NOTE) Pre diabetes:          5.7%-6.4% Diabetes:              >6.4% Glycemic control for   <7.0% adults with diabetes    Mean Plasma Glucose 131.24 mg/dL    Comment: Performed at [facility], [address] [place], [place] [address]    Sedimentation rate     Status: Abnormal   Collection Time: 05/03/17 10:05 PM  Result Value Ref Range   Sed Rate 48 (H) 0 - 22 mm/hr  C-reactive protein     Status: Abnormal   Collection Time: 05/03/17 10:05 PM  Result Value Ref Range   CRP 23.6 (H) <1.0 mg/dL    Comment: Performed at [facility] [address] [place], [place] [address]  Prealbumin     Status: Abnormal   Collection Time: 05/03/17 10:05 PM  Result Value Ref Range   Prealbumin 13.1 (L) 18 - 38 mg/dL    Comment: Performed at [facility] [address], [place], [place] [address]  Glucose, capillary     Status: Abnormal   Collection Time: 05/04/17  1:12 AM  Result Value Ref Range   Glucose-Capillary 127 (H) 65 - 99 mg/dL   Comment 1 Notify RN    Comment 2 Document in Chart   Basic metabolic panel     Status: Abnormal   Collection Time: 05/04/17  5:58 AM  Result Value Ref Range   Sodium 136 135 - 145 mmol/L   Potassium 3.8 3.5 - 5.1 mmol/L   Chloride 104 101 - 111 mmol/L   CO2 23 22 - 32 mmol/L  Glucose, Bld 120 (H) 65 - 99 mg/dL   BUN 13 6 - 20 mg/dL   Creatinine, Ser 0.71 0.44 - 1.00 mg/dL   Calcium 8.6 (L) 8.9 - 10.3 mg/dL   GFR calc non Af Amer >60 >60 mL/min   GFR calc Af Amer >60 >60 mL/min    Comment: (NOTE) The eGFR has been calculated using the CKD EPI equation. This calculation has not been validated in all clinical situations. eGFR's persistently <60 mL/min signify possible Chronic Kidney Disease.    Anion gap 9 5 - 15  CBC WITH DIFFERENTIAL     Status: Abnormal   Collection Time: 05/04/17  5:58 AM  Result Value Ref Range   WBC 9.7 4.0 - 10.5 K/uL   RBC 4.03 3.87 - 5.11 MIL/uL   Hemoglobin 11.1 (L) 12.0 - 15.0 g/dL   HCT 35.1 (L) 36.0 - 46.0 %   MCV 87.1 78.0 - 100.0 fL   MCH 27.5 26.0 - 34.0 pg   MCHC 31.6 30.0 - 36.0 g/dL   RDW 14.6 11.5 - 15.5 %   Platelets 150 150 - 400 K/uL   Neutrophils Relative % 74 %   Neutro Abs 7.1 1.7 - 7.7 K/uL   Lymphocytes Relative 15 %    Lymphs Abs 1.4 0.7 - 4.0 K/uL   Monocytes Relative 10 %   Monocytes Absolute 1.0 0.1 - 1.0 K/uL   Eosinophils Relative 1 %   Eosinophils Absolute 0.1 0.0 - 0.7 K/uL   Basophils Relative 0 %   Basophils Absolute 0.0 0.0 - 0.1 K/uL  Glucose, capillary     Status: Abnormal   Collection Time: 05/04/17  7:43 AM  Result Value Ref Range   Glucose-Capillary 126 (H) 65 - 99 mg/dL   Comment 1 Notify RN    Comment 2 Document in Chart   Glucose, capillary     Status: Abnormal   Collection Time: 05/04/17 11:50 AM  Result Value Ref Range   Glucose-Capillary 107 (H) 65 - 99 mg/dL   Comment 1 Notify RN    Comment 2 Document in Chart   Glucose, capillary     Status: None   Collection Time: 05/04/17  4:36 PM  Result Value Ref Range   Glucose-Capillary 96 65 - 99 mg/dL   Comment 1 Notify RN    Comment 2 Document in Chart   Glucose, capillary     Status: Abnormal   Collection Time: 05/04/17 10:13 PM  Result Value Ref Range   Glucose-Capillary 112 (H) 65 - 99 mg/dL   Comment 1 Notify RN    Comment 2 Document in Chart   Glucose, capillary     Status: Abnormal   Collection Time: 05/05/17  8:20 AM  Result Value Ref Range   Glucose-Capillary 173 (H) 65 - 99 mg/dL  Glucose, capillary     Status: Abnormal   Collection Time: 05/05/17  1:21 PM  Result Value Ref Range   Glucose-Capillary 183 (H) 65 - 99 mg/dL  Glucose, capillary     Status: Abnormal   Collection Time: 05/05/17  4:31 PM  Result Value Ref Range   Glucose-Capillary 106 (H) 65 - 99 mg/dL   Comment 1 Notify RN    Comment 2 Document in Chart     Mr Foot Right Wo Contrast  Result Date: 05/05/2017 CLINICAL DATA:  Acute onset redness, pain and swelling of the right great toe 2 days ago. EXAM: MRI OF THE RIGHT FOREFOOT WITHOUT CONTRAST TECHNIQUE: Multiplanar, multisequence MR  imaging of the right forefoot was performed. No intravenous contrast was administered. COMPARISON:  Plain films right foot 05/03/2017. FINDINGS: Bones/Joint/Cartilage  There is no bone marrow signal abnormality to suggest osteomyelitis. No fracture or dislocation. No notable arthropathy. Ligaments Intact. Muscles and Tendons Intact.  There is fatty atrophy of intrinsic musculature the foot. Soft tissues The patient appears to have a skin ulceration on the plantar surface of the foot at the level of the first MTP joint. An underlying subcutaneous fluid collection measures 0.5 cm craniocaudal by 0.7 cm transverse by 0.9 cm long. Soft tissues of the foot are swollen. Soft tissue gas centered about the first MTP joint is identified. IMPRESSION: Cellulitis centered about the first MTP joint. A small subcutaneous fluid collection in the plantar soft tissues deep to the first MTP joint is compatible with abscess. Negative for septic joint or osteomyelitis. Electronically Signed   By: Inge Rise M.D.   On: 05/05/2017 09:50   US Arterial Abi (screening Lower Extremity)  Result Date: 05/05/2017 CLINICAL DATA:  Diabetic right foot ulcer. EXAM: NONINVASIVE PHYSIOLOGIC VASCULAR STUDY OF BILATERAL LOWER EXTREMITIES TECHNIQUE: Evaluation of both lower extremities were performed at rest, including calculation of ankle-brachial indices with single level Doppler, pressure and pulse volume recording. COMPARISON:  None. FINDINGS: Right ABI:  1.06 Left ABI:  0.95 Right Lower Extremity: Primarily monophasic waveforms in the right ankle. Left Lower Extremity: Normal waveforms at the left dorsalis pedis artery. IMPRESSION: Normal ankle-brachial indices. Electronically Signed   By: Markus Daft M.D.   On: 05/05/2017 09:55   Dg Foot Complete Right  Result Date: 05/03/2017 CLINICAL DATA:  Acute onset of right foot erythema and swelling, particularly about the great toe. Chronic right foot pain. EXAM: RIGHT FOOT COMPLETE - 3+ VIEW COMPARISON:  None. FINDINGS: Scattered soft tissue air is noted along the medial and plantar aspects of the great toe, at the level of the first metatarsophalangeal  joint, concerning for infection with a gas producing organism. Necrotizing fasciitis cannot be excluded. No definite osseous erosions are seen. Visualized joint spaces are grossly preserved. Scattered vascular calcifications are noted. The subtalar joint is unremarkable. There is no evidence of fracture or dislocation. IMPRESSION: Soft tissue air along the medial and plantar aspects of the great toe, at the level of the first metatarsophalangeal joint, concerning for infection with a gas producing organism. Necrotizing fasciitis cannot be excluded. No definite osseous erosions seen. These results were called by telephone at the time of interpretation on 05/03/2017 at 9:22 pm to Dr. Rogene Houston, who verbally acknowledged these results. Electronically Signed   By: Garald Balding M.D.   On: 05/03/2017 21:23   Procedure Note Date: 05/05/17  Verbal consent was obtained from the patient.   Pre-procedure diagnosis: Right diabetic foot abscess Post-procedure diagnosis: Same Procedure: Sharp excisional debridement of diabetic foot infection superficial   Description:  Area of the right foot was cleaned with chloraprep. The patient was asensate. The blister was unroofed and cultures were taken of the purulent bloody fluid. Sharp excisional debridement of the tissue was performed down to bleeding tissue. This was down with scissors. The ulceration with the puncture wound was noted inferior to the blood blister and connected. Necrotic tissue was excised down to bleeding tissue. The edges were cleaned up to bleeding tissue. The patient tolerated the procedure well.  The area was packed with dampened saline gauze and covered with a bandage.   Assessment & Plan:  Jayle Solarz is a 83 y.o. female with right  foot diabetic foot infection. Debridement was done at the bedside for source control to help prevent any further extension of her cellulitis or blistering.  She tolerated this well.  -Plan for BID damp to dry  dressing changes with saline dampened gauze and bandage, dry gauze between toes, starting tomorrow AM  -Cultures sent -Will follow and see if needs further debriement  All questions were answered to the satisfaction of the patient.   Jessica Russell 05/05/2017, 8:46 PM

## 2017-05-05 NOTE — Consult Note (Signed)
Post Nurse wound consult note Reason for Consult:Right foot infection, first metatarsal head, plantar aspect.   insensate feet, ulcer at plantar aspect right foot at level of 1st MTP with fluctuance, purulent discharge expressed; erythema and edema extends proximally to ankle. Is on vancomycin IV daily.  Wound type: Infectious neuropathic ulcer.  Podiatry consult pending.  Pressure Injury POA: Yes Measurement: 2 cm x 2 cm  Wound bed: devitalized tissue Drainage (amount, consistency, odor) minimal purulent effluent Periwound: Edema, erythema Dressing procedure/placement/frequency: Cleanse right foot with NS and pat dry.  Apply Aquacel Ag to nonintact wound.  Cover with 4x4 gauze and kerlix/tape.  Change Monday/Wednesday/friday.  Will not follow at this time.  Please re-consult if needed.  Domenic Moras RN BSN [place] Pager [phone]

## 2017-05-06 LAB — GLUCOSE, CAPILLARY
GLUCOSE-CAPILLARY: 152 mg/dL — AB (ref 65–99)
GLUCOSE-CAPILLARY: 115 mg/dL — AB (ref 65–99)
GLUCOSE-CAPILLARY: 127 mg/dL — AB (ref 65–99)
Glucose-Capillary: 83 mg/dL (ref 65–99)

## 2017-05-06 LAB — BASIC METABOLIC PANEL
Anion gap: 12 (ref 5–15)
BUN: 18 mg/dL (ref 6–20)
CO2: 23 mmol/L (ref 22–32)
Calcium: 9 mg/dL (ref 8.9–10.3)
Chloride: 101 mmol/L (ref 101–111)
Creatinine, Ser: 0.67 mg/dL (ref 0.44–1.00)
GFR calc Af Amer: >60 mL/min (ref >60)
Glucose, Bld: 115 mg/dL — ABNORMAL HIGH (ref 65–99)
POTASSIUM: 4.3 mmol/L (ref 3.5–5.1)
SODIUM: 136 mmol/L (ref 135–145)

## 2017-05-06 LAB — CBC
HCT: 37.1 % (ref 36.0–46.0)
Hemoglobin: 11.7 g/dL — ABNORMAL LOW (ref 12.0–15.0)
MCH: 27.5 pg (ref 26.0–34.0)
MCHC: 31.5 g/dL (ref 30.0–36.0)
MCV: 87.3 fL (ref 78.0–100.0)
PLATELETS: 206 10*3/uL (ref 150–400)
RBC: 4.25 MIL/uL (ref 3.87–5.11)
RDW: 14.4 % (ref 11.5–15.5)
WBC: 7.8 10*3/uL (ref 4.0–10.5)

## 2017-05-06 NOTE — Progress Notes (Signed)
[facility] Progress Note     Subjective: Doing well. Feels like her foot is less swollen and less red. Overall feels good.   Objective: Vital signs in last 24 hours: Temp:  [98.7 F (37.1 C)-99 F (37.2 C)] 98.7 F (37.1 C) (12/27 1446) Pulse Rate:  [73-74] 73 (12/27 1446) Resp:  [16-17] 16 (12/27 1446) BP: (128-129)/(59-69) 129/69 (12/27 1446) SpO2:  [98 %-99 %] 98 % (12/27 1446) Last BM Date: 05/06/17  Intake/Output from previous day: No intake/output data recorded. Intake/Output this shift: No intake/output data recorded.  General appearance: alert, cooperative and no distress Resp: normal work breathing Extremities: right foot wound with bed with some beefy tissue, some areas of necrotic tissue, no drainage, improved cellulits and swelling    Lab Results:  Recent Labs    05/04/17 0558 05/06/17 0515  WBC 9.7 7.8  HGB 11.1* 11.7*  HCT 35.1* 37.1  PLT 150 206   BMET Recent Labs    05/04/17 0558 05/06/17 0515  NA 136 136  K 3.8 4.3  CL 104 101  CO2 23 23  GLUCOSE 120* 115*  BUN 13 18  CREATININE 0.71 0.67  CALCIUM 8.6* 9.0   PT/INR No results for input(s): LABPROT, INR in the last 72 hours.  Studies/Results: Mr Foot Right Wo Contrast  Result Date: 05/05/2017 CLINICAL DATA:  Acute onset redness, pain and swelling of the right great toe 2 days ago. EXAM: MRI OF THE RIGHT FOREFOOT WITHOUT CONTRAST TECHNIQUE: Multiplanar, multisequence MR imaging of the right forefoot was performed. No intravenous contrast was administered. COMPARISON:  Plain films right foot 05/03/2017. FINDINGS: Bones/Joint/Cartilage There is no bone marrow signal abnormality to suggest osteomyelitis. No fracture or dislocation. No notable arthropathy. Ligaments Intact. Muscles and Tendons Intact.  There is fatty atrophy of intrinsic musculature the foot. Soft tissues The patient appears to have a skin ulceration on the plantar surface of the foot at the level of the first  MTP joint. An underlying subcutaneous fluid collection measures 0.5 cm craniocaudal by 0.7 cm transverse by 0.9 cm long. Soft tissues of the foot are swollen. Soft tissue gas centered about the first MTP joint is identified. IMPRESSION: Cellulitis centered about the first MTP joint. A small subcutaneous fluid collection in the plantar soft tissues deep to the first MTP joint is compatible with abscess. Negative for septic joint or osteomyelitis. Electronically Signed   By: Inge Rise M.D.   On: 05/05/2017 09:50   US Arterial Abi (screening Lower Extremity)  Result Date: 05/05/2017 CLINICAL DATA:  Diabetic right foot ulcer. EXAM: NONINVASIVE PHYSIOLOGIC VASCULAR STUDY OF BILATERAL LOWER EXTREMITIES TECHNIQUE: Evaluation of both lower extremities were performed at rest, including calculation of ankle-brachial indices with single level Doppler, pressure and pulse volume recording. COMPARISON:  None. FINDINGS: Right ABI:  1.06 Left ABI:  0.95 Right Lower Extremity: Primarily monophasic waveforms in the right ankle. Left Lower Extremity: Normal waveforms at the left dorsalis pedis artery. IMPRESSION: Normal ankle-brachial indices. Electronically Signed   By: Markus Daft M.D.   On: 05/05/2017 09:55    Anti-infectives: Anti-infectives (From admission, onward)   Start     Dose/Rate Route Frequency Ordered Stop   05/04/17 1800  vancomycin (VANCOCIN) IVPB 1000 mg/200 mL premix     1,000 mg 200 mL/hr over 60 Minutes Intravenous Every 24 hours 05/04/17 0833     05/03/17 2300  cefTRIAXone (ROCEPHIN) 2 g in dextrose 5 % 50 mL IVPB     2 g 100 mL/hr over  30 Minutes Intravenous Every 24 hours 05/03/17 2255     05/03/17 2300  metroNIDAZOLE (FLAGYL) IVPB 500 mg     500 mg 100 mL/hr over 60 Minutes Intravenous Every 8 hours 05/03/17 2255     05/03/17 2145  vancomycin (VANCOCIN) IVPB 1000 mg/200 mL premix     1,000 mg 200 mL/hr over 60 Minutes Intravenous  Once 05/03/17 2139 05/03/17 2322       Assessment/Plan: Ms. Dutkiewicz is a 83 yo with diabetic foot infection/ superficial abscess s/p bedside debridement with some improvement in cellulitis after drainage.  -Bid dressing changes with saline dampened gauze ordered -IV antibiotics for now -Cultures with GNR and GPC on stain  -Will follow wound and see if will need further debridement in upcoming days  Updated Dr. Wynetta Emery.   LOS: 3 days    Virl Cagey 05/06/2017

## 2017-05-06 NOTE — Progress Notes (Signed)
PROGRESS NOTE  Jessica Russell  UEA:[id number]  DOB: 1927/10/29  DOA: 05/03/2017 PCP: Raylene Everts, MD  Brief Admission Hx: Jessica Russell is a 83 y.o. female with medical history significant for type 2 diabetes mellitus and peripheral neuropathy, now presenting to the emergency department for evaluation of a right foot ulcer with purulent drainage and surrounding erythema.   MDM/Assessment & Plan:   1. Diabetic foot ulcer with infection  - Presented with at least 1 week of blistering and ulceration at plantar aspect right foot overlying first MTP with recent purulent drainage and progression of erythema up the leg  - Afebrile and hemodynamically stable on admission with mild leukocytosis  - Radiographs reveal gas, likely from overlying ulcer/open wound; she does not appears toxic; no definite osseous erosion on plain films   - Blood cultures were collected in ED and empiric vancomycin administered - ABI testing was normal, continue empiric treatment with Rocephin, Flagyl, vancomycin, consult with wound-care.  Pt is s/p bedside I&D with Dr. Constance Haw 12/26 and tolerated well. Continue wound care and antibiotics per Dr. Constance Haw recommendations.   -Diabetic peripheral polyneuropathy with insensate feet - pt is at high risk for amputation and counseled her on proper foot care and she will need to establish with a regular podiatrist. She says she has an appointment on 05/14/17.  -MRI of right foot ordered to more closely evaluate for osteomyelitis.  2. Type 2 DM  - A1c 6.2% - Managed with metformin only at home  - Complicated by neuropathy with foot ulcer as above  - Check CBG's, start a SSI with Novolog    CBG (last 3)  Recent Labs    05/05/17 1631 05/05/17 2155 05/06/17 0754  GLUCAP 106* 120* 152*   3. Hyponatremia  - Serum sodium is 132 on admission in setting of hypovolemia  - Treated with 250 cc NS in ED and continued on IVF hydration - Resolved now    DVT  prophylaxis: Lovenox Code Status: Full  Family Communication: Husband at bedside Disposition Plan: Admit to med-surg Consults called: gen surgery, wound care   Subjective: Pt tolerated I&D well.  She denies pain or discomfort.   Objective: Vitals:   05/04/17 1300 05/04/17 2214 05/05/17 0517 05/05/17 2157  BP: (!) 113/59 (!) 146/60 (!) 123/54 (!) 128/59  Pulse: 76 74 74 74  Resp: 20 20 14 17   Temp: 98.4 F (36.9 C) 97.8 F (36.6 C) 98.9 F (37.2 C) 99 F (37.2 C)  TempSrc: Oral     SpO2: 98% 100% 95% 99%  Weight:      Height:       No intake or output data in the 24 hours ending 05/06/17 0930 Filed Weights   05/03/17 2037  Weight: 80.7 kg (178 lb)    REVIEW OF SYSTEMS  As per history otherwise all reviewed and reported negative  Exam:  General exam: awake, alert, NAD. Cooperative.  Respiratory system: Clear. No increased work of breathing. Cardiovascular system: S1 & S2 heard, RRR. No JVD, murmurs, gallops, clicks or pedal edema. Gastrointestinal system: Abdomen is nondistended, soft and nontender. Normal bowel sounds heard. Central nervous system: Alert and oriented. No focal neurological deficits. Extremities: insensate feet, right foot wounds clean and dry.   Data Reviewed: Basic Metabolic Panel: Recent Labs  Lab 05/03/17 2140 05/04/17 0558 05/06/17 0515  NA 132* 136 136  K 3.8 3.8 4.3  CL 99* 104 101  CO2 22 23 23   GLUCOSE 212* 120* 115*  BUN 18  13 18  CREATININE 0.84 0.71 0.67  CALCIUM 8.9 8.6* 9.0   Liver Function Tests: Recent Labs  Lab 05/03/17 2140  AST 24  ALT 18  ALKPHOS 74  BILITOT 0.9  PROT 7.2  ALBUMIN 3.6   No results for input(s): LIPASE, AMYLASE in the last 168 hours. No results for input(s): AMMONIA in the last 168 hours. CBC: Recent Labs  Lab 05/03/17 2140 05/04/17 0558 05/06/17 0515  WBC 11.0* 9.7 7.8  NEUTROABS 9.3* 7.1  --   HGB 12.4 11.1* 11.7*  HCT 38.5 35.1* 37.1  MCV 86.9 87.1 87.3  PLT 171 150 206    Cardiac Enzymes: No results for input(s): CKTOTAL, CKMB, CKMBINDEX, TROPONINI in the last 168 hours. CBG (last 3)  Recent Labs    05/05/17 1631 05/05/17 2155 05/06/17 0754  GLUCAP 106* 120* 152*   Recent Results (from the past 240 hour(s))  Culture, blood (Routine X 2) w Reflex to ID Panel     Status: None (Preliminary result)   Collection Time: 05/03/17 10:02 PM  Result Value Ref Range Status   Specimen Description BLOOD LEFT ARM  Final   Special Requests   Final    BOTTLES DRAWN AEROBIC AND ANAEROBIC Blood Culture adequate volume   Culture NO GROWTH 3 DAYS  Final   Report Status PENDING  Incomplete  Culture, blood (Routine X 2) w Reflex to ID Panel     Status: None (Preliminary result)   Collection Time: 05/03/17 10:05 PM  Result Value Ref Range Status   Specimen Description BLOOD LEFT HAND  Final   Special Requests   Final    BOTTLES DRAWN AEROBIC AND ANAEROBIC Blood Culture results may not be optimal due to an inadequate volume of blood received in culture bottles   Culture NO GROWTH 3 DAYS  Final   Report Status PENDING  Incomplete     Studies: Mr Foot Right Wo Contrast  Result Date: 05/05/2017 CLINICAL DATA:  Acute onset redness, pain and swelling of the right great toe 2 days ago. EXAM: MRI OF THE RIGHT FOREFOOT WITHOUT CONTRAST TECHNIQUE: Multiplanar, multisequence MR imaging of the right forefoot was performed. No intravenous contrast was administered. COMPARISON:  Plain films right foot 05/03/2017. FINDINGS: Bones/Joint/Cartilage There is no bone marrow signal abnormality to suggest osteomyelitis. No fracture or dislocation. No notable arthropathy. Ligaments Intact. Muscles and Tendons Intact.  There is fatty atrophy of intrinsic musculature the foot. Soft tissues The patient appears to have a skin ulceration on the plantar surface of the foot at the level of the first MTP joint. An underlying subcutaneous fluid collection measures 0.5 cm craniocaudal by 0.7 cm  transverse by 0.9 cm long. Soft tissues of the foot are swollen. Soft tissue gas centered about the first MTP joint is identified. IMPRESSION: Cellulitis centered about the first MTP joint. A small subcutaneous fluid collection in the plantar soft tissues deep to the first MTP joint is compatible with abscess. Negative for septic joint or osteomyelitis. Electronically Signed   By: Inge Rise M.D.   On: 05/05/2017 09:50   US Arterial Abi (screening Lower Extremity)  Result Date: 05/05/2017 CLINICAL DATA:  Diabetic right foot ulcer. EXAM: NONINVASIVE PHYSIOLOGIC VASCULAR STUDY OF BILATERAL LOWER EXTREMITIES TECHNIQUE: Evaluation of both lower extremities were performed at rest, including calculation of ankle-brachial indices with single level Doppler, pressure and pulse volume recording. COMPARISON:  None. FINDINGS: Right ABI:  1.06 Left ABI:  0.95 Right Lower Extremity: Primarily monophasic waveforms in the right ankle.  Left Lower Extremity: Normal waveforms at the left dorsalis pedis artery. IMPRESSION: Normal ankle-brachial indices. Electronically Signed   By: Markus Daft M.D.   On: 05/05/2017 09:55   Scheduled Meds: . enoxaparin (LOVENOX) injection  40 mg Subcutaneous Q24H  . feeding supplement (PRO-STAT SUGAR FREE 64)  30 mL Oral BID  . gabapentin  800 mg Oral QID  . insulin aspart  0-5 Units Subcutaneous QHS  . insulin aspart  0-9 Units Subcutaneous TID WC  . multivitamin with minerals  1 tablet Oral Daily  . nutrition supplement (JUVEN)  1 packet Oral BID BM   Continuous Infusions: . cefTRIAXone (ROCEPHIN)  IV Stopped (05/05/17 2241)  . metronidazole Stopped (05/06/17 8337)  . vancomycin Stopped (05/05/17 1836)   Principal Problem:   Cellulitis Active Problems:   Diabetic neuropathy associated with type 2 diabetes mellitus (HCC)   Diabetes mellitus type II, non insulin dependent (HCC)   Diabetic foot ulcer ([place])   Abscess of right foot  Time spent:   Irwin Brakeman, MD,  FAAFP [facility] Pager [phone]  If 7PM-7AM, please contact night-coverage [email or web address] Password [email or web address] 05/06/2017, 9:30 AM    LOS: 3 days

## 2017-05-07 ENCOUNTER — Telehealth: Payer: Self-pay | Admitting: Family Medicine

## 2017-05-07 DIAGNOSIS — E119 Type 2 diabetes mellitus without complications: Secondary | ICD-10-CM

## 2017-05-07 DIAGNOSIS — L03115 Cellulitis of right lower limb: Secondary | ICD-10-CM

## 2017-05-07 LAB — GLUCOSE, CAPILLARY
GLUCOSE-CAPILLARY: 137 mg/dL — AB (ref 65–99)
Glucose-Capillary: 140 mg/dL — ABNORMAL HIGH (ref 65–99)
Glucose-Capillary: 95 mg/dL (ref 65–99)

## 2017-05-07 MED ORDER — HYDROCODONE-ACETAMINOPHEN 5-325 MG PO TABS
1.0000 | ORAL_TABLET | ORAL | Status: DC | PRN
  Filled 2017-05-07: qty 1

## 2017-05-07 MED ORDER — TRAMADOL HCL 50 MG PO TABS
100.0000 mg | ORAL_TABLET | Freq: Four times a day (QID) | ORAL | Status: DC | PRN
  Administered 2017-05-09: 100 mg via ORAL
  Filled 2017-05-07: qty 2

## 2017-05-07 NOTE — Progress Notes (Signed)
New wet to dry dressing to right foot wound.  Wound bed mostly yellow.  No odor noted.  Gauze placed between all right toes.  Patient ambulating at wiil in room with walking shoe on right foot.  Declined pain medication.

## 2017-05-07 NOTE — Care Management Important Message (Signed)
Important Message  Patient Details  Name: Jessica Russell MRN: [id number] Date of Birth: 01/31/1928   Medicare Important Message Given:  Yes    Sherald Barge, RN 05/07/2017, 2:45 PM

## 2017-05-07 NOTE — Progress Notes (Signed)
[facility] Progress Note     Subjective: No major issues. Did not sleep well. Does not think RN is packing the wound as deeply as I do it.   Objective: Vital signs in last 24 hours: Temp:  [98 F (36.7 C)-99.2 F (37.3 C)] 98 F (36.7 C) (12/28 1400) Pulse Rate:  [65-72] 72 (12/28 1400) Resp:  [16-17] 17 (12/28 1400) BP: (117-138)/(55-61) 129/61 (12/28 1400) SpO2:  [92 %-100 %] 99 % (12/28 1400) Last BM Date: 05/07/17  Intake/Output from previous day: 12/27 0701 - 12/28 0700 In: 720 [P.O.:720] Out: -  Intake/Output this shift: No intake/output data recorded.  General appearance: alert, cooperative and no distress Resp: normal work breathing right foot wound with edges salmon and healthy, deep tissue necrotic and some purulent drainage from inferior flap, cellulitis about the same  2+ DP on right    Additional bedside debridement done with a scissors. Patient tolerated well. Down to healthy bleeding tissue on the inferior edge and more clean in the base.   Lab Results:  Recent Labs    05/06/17 0515  WBC 7.8  HGB 11.7*  HCT 37.1  PLT 206   BMET Recent Labs    05/06/17 0515  NA 136  K 4.3  CL 101  CO2 23  GLUCOSE 115*  BUN 18  CREATININE 0.67  CALCIUM 9.0    Anti-infectives: Anti-infectives (From admission, onward)   Start     Dose/Rate Route Frequency Ordered Stop   05/04/17 1800  vancomycin (VANCOCIN) IVPB 1000 mg/200 mL premix     1,000 mg 200 mL/hr over 60 Minutes Intravenous Every 24 hours 05/04/17 0833     05/03/17 2300  cefTRIAXone (ROCEPHIN) 2 g in dextrose 5 % 50 mL IVPB     2 g 100 mL/hr over 30 Minutes Intravenous Every 24 hours 05/03/17 2255     05/03/17 2300  metroNIDAZOLE (FLAGYL) IVPB 500 mg     500 mg 100 mL/hr over 60 Minutes Intravenous Every 8 hours 05/03/17 2255     05/03/17 2145  vancomycin (VANCOCIN) IVPB 1000 mg/200 mL premix     1,000 mg 200 mL/hr over 60 Minutes Intravenous  Once 05/03/17 2139 05/03/17 2322       Assessment/Plan: Jessica Russell is a 83 yo with diabetic foot infection/ superficial abscess s/p bedside debridement  X2. Wound looking better. Cellulitis about the same to little better.  -Bid dressing changes with saline dampened gauze ordered, pack deep in the inferior flap  -IV antibiotics for now, cultures with proteus in the wound  -Will follow and monitor over weekend  Updated Dr. Karleen Hampshire.     LOS: 4 days    Jessica Russell 05/07/2017

## 2017-05-07 NOTE — Progress Notes (Signed)
PROGRESS NOTE    Jessica Russell  GUR:[id number] DOB: [birth year] DOA: 05/03/2017 PCP: Jessica Everts, MD Jessica Russell a 83 y.o.femalewith medical history significant fortype 2 diabetes mellitus and peripheral neuropathy, now presenting to the emergency department for evaluation of a right foot ulcer with purulent drainage and surrounding erythema.      Brief Narrative:Jessica Reiteris a 83 y.o.femalewith medical history significant fortype 2 diabetes mellitus and peripheral neuropathy, now presenting to the emergency department for evaluation of a right foot ulcer with purulent drainage and surrounding erythema.     Assessment & Plan:   Principal Problem:   Cellulitis Active Problems:   Diabetic neuropathy associated with type 2 diabetes mellitus (HCC)   Diabetes mellitus type II, non insulin dependent (HCC)   Diabetic foot ulcer (Jessica Russell)   Abscess of right foot   Diabetic foot ulcer with infection Improving, patient underwent bedside I&D with Dr. Constance Haw on 1226 and another IND today 12/28.  Continue with IV antibiotics and wound care as per Dr. Constance Haw recommendations.   Type 2 diabetes mellitus CBG (last 3)  Recent Labs    05/07/17 0840 05/07/17 1212 05/07/17 1705  GLUCAP 137* 140* 95   Resume SSI.    HYPONATREMIA; proved    DVT prophylaxis: Lovenox Code Status: Full code) Family Communication: Family at bedside disposition Plan: Pending further evaluation.   Consultants:   Surgery.   Procedures: I&D  Antimicrobials: vancomycin and rocephin.    Subjective: Pain better controlled.   Objective: Vitals:   05/06/17 1446 05/06/17 2123 05/07/17 0638 05/07/17 1400  BP: 129/69 (!) 138/57 (!) 117/55 129/61  Pulse: 73 72 65 72  Resp: 16 16 16 17   Temp: 98.7 F (37.1 C) 99.2 F (37.3 C) 98.4 F (36.9 C) 98 F (36.7 C)  TempSrc:  Oral Oral Oral  SpO2: 98% 92% 100% 99%  Weight:      Height:       No intake or output data in the 24  hours ending 05/07/17 [place] Weights   05/03/17 2037  Weight: 80.7 kg (178 lb)    Examination:  General exam: Appears calm and comfortable  Respiratory system: Clear to auscultation. Respiratory effort normal. Cardiovascular system: S1 & S2 heard, RRR. No JVD, murmurs, rubs, gallops or clicks. No pedal edema. Gastrointestinal system: Abdomen is nondistended, soft and nontender. No organomegaly or masses felt. Normal bowel sounds heard. Central nervous system: Alert and oriented. No focal neurological deficits. Extremities: right foot ulcer. Bandaged.  Skin: No rashes, lesions or ulcers Psychiatry: Judgement and insight appear normal. Mood & affect appropriate.     Data Reviewed: I have personally reviewed following labs and imaging studies  CBC: Recent Labs  Lab 05/03/17 2140 05/04/17 0558 05/06/17 0515  WBC 11.0* 9.7 7.8  NEUTROABS 9.3* 7.1  --   HGB 12.4 11.1* 11.7*  HCT 38.5 35.1* 37.1  MCV 86.9 87.1 87.3  PLT 171 150 283   Basic Metabolic Panel: Recent Labs  Lab 05/03/17 2140 05/04/17 0558 05/06/17 0515  NA 132* 136 136  K 3.8 3.8 4.3  CL 99* 104 101  CO2 22 23 23   GLUCOSE 212* 120* 115*  BUN 18 13 18   CREATININE 0.84 0.71 0.67  CALCIUM 8.9 8.6* 9.0   GFR: Estimated Creatinine Clearance: 49.8 mL/min (by C-G formula based on SCr of 0.67 mg/dL). Liver Function Tests: Recent Labs  Lab 05/03/17 2140  AST 24  ALT 18  ALKPHOS 74  BILITOT 0.9  PROT 7.2  ALBUMIN 3.6  No results for input(s): LIPASE, AMYLASE in the last 168 hours. No results for input(s): AMMONIA in the last 168 hours. Coagulation Profile: No results for input(s): INR, PROTIME in the last 168 hours. Cardiac Enzymes: No results for input(s): CKTOTAL, CKMB, CKMBINDEX, TROPONINI in the last 168 hours. BNP (last 3 results) No results for input(s): PROBNP in the last 8760 hours. HbA1C: No results for input(s): HGBA1C in the last 72 hours. CBG: Recent Labs  Lab 05/06/17 1637  05/06/17 2127 05/07/17 0840 05/07/17 1212 05/07/17 1705  GLUCAP 115* 127* 137* 140* 95   Lipid Profile: No results for input(s): CHOL, HDL, LDLCALC, TRIG, CHOLHDL, LDLDIRECT in the last 72 hours. Thyroid Function Tests: No results for input(s): TSH, T4TOTAL, FREET4, T3FREE, THYROIDAB in the last 72 hours. Anemia Panel: No results for input(s): VITAMINB12, FOLATE, FERRITIN, TIBC, IRON, RETICCTPCT in the last 72 hours. Sepsis Labs: Recent Labs  Lab 05/03/17 2202  LATICACIDVEN 2.3*    Recent Results (from the past 240 hour(s))  Culture, blood (Routine X 2) w Reflex to ID Panel     Status: None (Preliminary result)   Collection Time: 05/03/17 10:02 PM  Result Value Ref Range Status   Specimen Description BLOOD LEFT ARM  Final   Special Requests   Final    BOTTLES DRAWN AEROBIC AND ANAEROBIC Blood Culture adequate volume   Culture NO GROWTH 4 DAYS  Final   Report Status PENDING  Incomplete  Culture, blood (Routine X 2) w Reflex to ID Panel     Status: None (Preliminary result)   Collection Time: 05/03/17 10:05 PM  Result Value Ref Range Status   Specimen Description BLOOD LEFT HAND  Final   Special Requests   Final    BOTTLES DRAWN AEROBIC AND ANAEROBIC Blood Culture results may not be optimal due to an inadequate volume of blood received in culture bottles   Culture NO GROWTH 4 DAYS  Final   Report Status PENDING  Incomplete  Aerobic Culture (superficial specimen)     Status: None (Preliminary result)   Collection Time: 05/05/17  8:33 PM  Result Value Ref Range Status   Specimen Description ABSCESS  Final   Special Requests NONE  Final   Gram Stain   Final    RARE WBC PRESENT, PREDOMINANTLY MONONUCLEAR ABUNDANT GRAM NEGATIVE RODS MODERATE GRAM POSITIVE COCCI IN PAIRS AND CHAINS Performed at [facility], [address], [place], [place] [address]    Culture MODERATE PROTEUS MIRABILIS  Final   Report Status PENDING  Incomplete         Radiology Studies: No  results found.      Scheduled Meds: . enoxaparin (LOVENOX) injection  40 mg Subcutaneous Q24H  . feeding supplement (PRO-STAT SUGAR FREE 64)  30 mL Oral BID  . gabapentin  800 mg Oral QID  . insulin aspart  0-5 Units Subcutaneous QHS  . insulin aspart  0-9 Units Subcutaneous TID WC  . multivitamin with minerals  1 tablet Oral Daily  . nutrition supplement (JUVEN)  1 packet Oral BID BM   Continuous Infusions: . cefTRIAXone (ROCEPHIN)  IV Stopped (05/06/17 2233)  . metronidazole Stopped (05/07/17 1542)  . vancomycin 1,000 mg (05/07/17 1746)     LOS: 4 days    Time spent: 35 minutes.     Hosie Poisson, MD [facility] Pager [phone]  If 7PM-7AM, please contact night-coverage [email or web address] Password [facility] 05/07/2017, 7:21 PM

## 2017-05-07 NOTE — Progress Notes (Signed)
Pharmacy Antibiotic Note  Jessica Russell is a 83 y.o. female admitted on 05/03/2017 with right foot ulcer.  Pharmacy has been consulted for vancomycin dosing.  Patient also on Rocephin and Flagyl per MD  Plan: Continue vancomycin 1gm IV q24 hours Flagyl 500mg  IV q8h per MD Rocephin 2gm IV q24hrs per MD Consider narrow ABX to Unasyn 3gm IV q8h (single agent) pending finalized cultures F/u renal function, cultures and clinical course  Antimicrobials this admission: vanc 12/24  >>  rocephin 12/24 >>  Flagyl 12/25 >>  Microbiology results: 12/24 BCx: pending Aerobic Cx (Abscess) pending, GPC + GNR  Height: 5\' 3"  (160 cm) Weight: 178 lb (80.7 kg) IBW/kg (Calculated) : 52.4  Temp (24hrs), Avg:98.8 F (37.1 C), Min:98.4 F (36.9 C), Max:99.2 F (37.3 C)  Recent Labs  Lab 05/03/17 2140 05/03/17 2202 05/04/17 0558 05/06/17 0515  WBC 11.0*  --  9.7 7.8  CREATININE 0.84  --  0.71 0.67  LATICACIDVEN  --  2.3*  --   --     Estimated Creatinine Clearance: 49.8 mL/min (by C-G formula based on SCr of 0.67 mg/dL).    Allergies  Allergen Reactions  . Phenergan [Promethazine Hcl] Other (See Comments)    Made delerious   Thank you for allowing pharmacy to be a part of this patient's care.  Hart Robinsons A 05/07/2017 1:07 PM

## 2017-05-08 DIAGNOSIS — E1142 Type 2 diabetes mellitus with diabetic polyneuropathy: Secondary | ICD-10-CM

## 2017-05-08 LAB — BASIC METABOLIC PANEL
Anion gap: 11 (ref 5–15)
BUN: 21 mg/dL — AB (ref 6–20)
CO2: 22 mmol/L (ref 22–32)
CREATININE: 0.61 mg/dL (ref 0.44–1.00)
Calcium: 8.9 mg/dL (ref 8.9–10.3)
Chloride: 103 mmol/L (ref 101–111)
GFR calc Af Amer: >60 mL/min (ref >60)
GLUCOSE: 102 mg/dL — AB (ref 65–99)
Potassium: 3.8 mmol/L (ref 3.5–5.1)
SODIUM: 136 mmol/L (ref 135–145)

## 2017-05-08 LAB — CBC
HCT: 37.1 % (ref 36.0–46.0)
Hemoglobin: 11.8 g/dL — ABNORMAL LOW (ref 12.0–15.0)
MCH: 27.7 pg (ref 26.0–34.0)
MCHC: 31.8 g/dL (ref 30.0–36.0)
MCV: 87.1 fL (ref 78.0–100.0)
PLATELETS: 249 10*3/uL (ref 150–400)
RBC: 4.26 MIL/uL (ref 3.87–5.11)
RDW: 14.2 % (ref 11.5–15.5)
WBC: 7 10*3/uL (ref 4.0–10.5)

## 2017-05-08 LAB — GLUCOSE, CAPILLARY
GLUCOSE-CAPILLARY: 89 mg/dL (ref 65–99)
GLUCOSE-CAPILLARY: 189 mg/dL — AB (ref 65–99)
GLUCOSE-CAPILLARY: 82 mg/dL (ref 65–99)
Glucose-Capillary: 107 mg/dL — ABNORMAL HIGH (ref 65–99)

## 2017-05-08 LAB — CULTURE, BLOOD (ROUTINE X 2)
Culture: NO GROWTH
Culture: NO GROWTH
Special Requests: ADEQUATE

## 2017-05-08 NOTE — Plan of Care (Signed)
Patient denies pain to right foot wound. Tolerated dressing change this afternoon with no complaints. MD present for dressing change. Husband present at bedside and educated on wound care per MD as well. Husband and son to come again tonight for evening dressing change and assist per MD recommendation. Night shift RN to reinforce education per Dr. Constance Haw request. Donavan Foil, RN

## 2017-05-08 NOTE — Progress Notes (Signed)
[facility] Progress Note     Subjective: Wound improving. RN did great job overnight and today packing wound. Feeling better.   Objective: Vital signs in last 24 hours: Temp:  [98 F (36.7 C)-98.5 F (36.9 C)] 98.4 F (36.9 C) (12/29 0553) Pulse Rate:  [66-72] 66 (12/29 0553) Resp:  [16-17] 16 (12/29 0553) BP: (115-141)/(56-72) 115/56 (12/29 0553) SpO2:  [97 %-99 %] 97 % (12/29 0553) Last BM Date: 05/07/17  Intake/Output from previous day: No intake/output data recorded. Intake/Output this shift: No intake/output data recorded.  General appearance: alert, cooperative and no distress Resp: normal work breathing right medial foot/ plantar surface with wound, decreased necrotic tissue at base, salmon colored tissue surrounding, no drainage, improved cellulitis   Lab Results:  Recent Labs    05/06/17 0515 05/08/17 0629  WBC 7.8 7.0  HGB 11.7* 11.8*  HCT 37.1 37.1  PLT 206 249   BMET Recent Labs    05/06/17 0515  NA 136  K 4.3  CL 101  CO2 23  GLUCOSE 115*  BUN 18  CREATININE 0.67  CALCIUM 9.0   Anti-infectives: Anti-infectives (From admission, onward)   Start     Dose/Rate Route Frequency Ordered Stop   05/04/17 1800  vancomycin (VANCOCIN) IVPB 1000 mg/200 mL premix  Status:  Discontinued     1,000 mg 200 mL/hr over 60 Minutes Intravenous Every 24 hours 05/04/17 0833 05/08/17 1103   05/03/17 2300  cefTRIAXone (ROCEPHIN) 2 g in dextrose 5 % 50 mL IVPB     2 g 100 mL/hr over 30 Minutes Intravenous Every 24 hours 05/03/17 2255     05/03/17 2300  metroNIDAZOLE (FLAGYL) IVPB 500 mg  Status:  Discontinued     500 mg 100 mL/hr over 60 Minutes Intravenous Every 8 hours 05/03/17 2255 05/08/17 1103   05/03/17 2145  vancomycin (VANCOCIN) IVPB 1000 mg/200 mL premix     1,000 mg 200 mL/hr over 60 Minutes Intravenous  Once 05/03/17 2139 05/03/17 2322      Assessment/Plan: Ms. Jessica Russell is a 83 yo with diabetic foot infection/ superficial abscess s/p  bedside debridement  X2. Improving.  -Bid dressing changes with saline dampened gauze ordered, pack deep in the inferior flap  -Husband/ family to learn to pack tonight  -IV antibiotics for now, cultures with proteus in the wound, pan sensitive, discharge with po antibiotics  -If wound packing is going ok, recommend d/c home tomorrow with po antibiotics for 7 more days, see Podiatry on 05/13/2017 as previously scheduled -Podiatrist that patient has appointment with is Jessica Russell, DPM, office # [phone]; I'll call and update her on the patient prior to Wednesday's appointment    LOS: 5 days    Virl Cagey 05/08/2017

## 2017-05-08 NOTE — Progress Notes (Signed)
PROGRESS NOTE    Jessica Russell  JKD:[id number] DOB: [birth year] DOA: 05/03/2017 PCP: Raylene Everts, MD Jessica Russell a 83 y.o.femalewith medical history significant fortype 2 diabetes mellitus and peripheral neuropathy, now presenting to the emergency department for evaluation of a right foot ulcer with purulent drainage and surrounding erythema.      Brief Narrative:Jessica Reiteris a 83 y.o.femalewith medical history significant fortype 2 diabetes mellitus and peripheral neuropathy, now presenting to the emergency department for evaluation of a right foot ulcer with purulent drainage and surrounding erythema.     Assessment & Plan:   Principal Problem:   Cellulitis Active Problems:   Diabetic neuropathy associated with type 2 diabetes mellitus (HCC)   Diabetes mellitus type II, non insulin dependent (HCC)   Diabetic foot ulcer ([place])   Abscess of right foot   Diabetic foot ulcer with infection Improving, patient underwent bedside I&D with Dr. Constance Haw on 1226 and another I&D on 12/28.  Continue with IV antibiotics and wound care as per Dr. Constance Haw recommendations.  Abscess cultures growing Proteus, pansensitive.  We will plan for discharge with 7 more days of oral antibiotics to complete the course.  Follow-up with podiatry on discharge as scheduled.  Narrow the antibiotics from vancomycin and Rocephin to Rocephin alone and will discharge on oral antibiotics on discharge.   Type 2 diabetes mellitus CBG (last 3)  Recent Labs    05/07/17 1705 05/08/17 0756 05/08/17 1131  GLUCAP 95 89 189*   Resume SSI.    HYPONATREMIA; resolved   Mild normocytic anemia probably from anemia of chronic disease.    DVT prophylaxis: Lovenox Code Status: Full code) Family Communication: Family at bedside  disposition Plan: Home tomorrow   Consultants:   Surgery.   Procedures: I&D  Antimicrobials: vancomycin and rocephin.    Subjective: Pain better  controlled.  No new complaints  Objective: Vitals:   05/07/17 0638 05/07/17 1400 05/07/17 2052 05/08/17 0553  BP: (!) 117/55 129/61 (!) 141/72 (!) 115/56  Pulse: 65 72 71 66  Resp: 16 17 16 16   Temp: 98.4 F (36.9 C) 98 F (36.7 C) 98.5 F (36.9 C) 98.4 F (36.9 C)  TempSrc: Oral Oral Oral Oral  SpO2: 100% 99% 98% 97%  Weight:      Height:       No intake or output data in the 24 hours ending 05/08/17 1508 Filed Weights   05/03/17 2037  Weight: 80.7 kg (178 lb)    Examination:  General exam: Appears calm and comfortable not in any kind of distress Respiratory system: Clear to auscultation. Respiratory effort normal.  No wheezing or rhonchi Cardiovascular system: S1 & S2 heard, RRR. No JVD, murmurs,. No pedal edema. Gastrointestinal system: Abdomen is soft non tender non distended bowel sounds heard.  Central nervous system: Alert and oriented. No focal neurological deficits. Extremities: right foot ulcer. Bandaged.  No discharge. Skin: No rashes, lesions or ulcers Psychiatry:  Mood & affect appropriate.     Data Reviewed: I have personally reviewed following labs and imaging studies  CBC: Recent Labs  Lab 05/03/17 2140 05/04/17 0558 05/06/17 0515 05/08/17 0629  WBC 11.0* 9.7 7.8 7.0  NEUTROABS 9.3* 7.1  --   --   HGB 12.4 11.1* 11.7* 11.8*  HCT 38.5 35.1* 37.1 37.1  MCV 86.9 87.1 87.3 87.1  PLT 171 150 206 099   Basic Metabolic Panel: Recent Labs  Lab 05/03/17 2140 05/04/17 0558 05/06/17 0515  NA 132* 136 136  K 3.8 3.8  4.3  CL 99* 104 101  CO2 22 23 23   GLUCOSE 212* 120* 115*  BUN 18 13 18   CREATININE 0.84 0.71 0.67  CALCIUM 8.9 8.6* 9.0   GFR: Estimated Creatinine Clearance: 49.8 mL/min (by C-G formula based on SCr of 0.67 mg/dL). Liver Function Tests: Recent Labs  Lab 05/03/17 2140  AST 24  ALT 18  ALKPHOS 74  BILITOT 0.9  PROT 7.2  ALBUMIN 3.6   No results for input(s): LIPASE, AMYLASE in the last 168 hours. No results for  input(s): AMMONIA in the last 168 hours. Coagulation Profile: No results for input(s): INR, PROTIME in the last 168 hours. Cardiac Enzymes: No results for input(s): CKTOTAL, CKMB, CKMBINDEX, TROPONINI in the last 168 hours. BNP (last 3 results) No results for input(s): PROBNP in the last 8760 hours. HbA1C: No results for input(s): HGBA1C in the last 72 hours. CBG: Recent Labs  Lab 05/07/17 0840 05/07/17 1212 05/07/17 1705 05/08/17 0756 05/08/17 1131  GLUCAP 137* 140* 95 89 189*   Lipid Profile: No results for input(s): CHOL, HDL, LDLCALC, TRIG, CHOLHDL, LDLDIRECT in the last 72 hours. Thyroid Function Tests: No results for input(s): TSH, T4TOTAL, FREET4, T3FREE, THYROIDAB in the last 72 hours. Anemia Panel: No results for input(s): VITAMINB12, FOLATE, FERRITIN, TIBC, IRON, RETICCTPCT in the last 72 hours. Sepsis Labs: Recent Labs  Lab 05/03/17 2202  LATICACIDVEN 2.3*    Recent Results (from the past 240 hour(s))  Culture, blood (Routine X 2) w Reflex to ID Panel     Status: None   Collection Time: 05/03/17 10:02 PM  Result Value Ref Range Status   Specimen Description BLOOD LEFT ARM  Final   Special Requests   Final    BOTTLES DRAWN AEROBIC AND ANAEROBIC Blood Culture adequate volume   Culture NO GROWTH 5 DAYS  Final   Report Status 05/08/2017 FINAL  Final  Culture, blood (Routine X 2) w Reflex to ID Panel     Status: None   Collection Time: 05/03/17 10:05 PM  Result Value Ref Range Status   Specimen Description BLOOD LEFT HAND  Final   Special Requests   Final    BOTTLES DRAWN AEROBIC AND ANAEROBIC Blood Culture results may not be optimal due to an inadequate volume of blood received in culture bottles   Culture NO GROWTH 5 DAYS  Final   Report Status 05/08/2017 FINAL  Final  Aerobic Culture (superficial specimen)     Status: None (Preliminary result)   Collection Time: 05/05/17  8:33 PM  Result Value Ref Range Status   Specimen Description ABSCESS  Final    Special Requests NONE  Final   Gram Stain   Final    RARE WBC PRESENT, PREDOMINANTLY MONONUCLEAR ABUNDANT GRAM NEGATIVE RODS MODERATE GRAM POSITIVE COCCI IN PAIRS AND CHAINS    Culture   Final    MODERATE PROTEUS MIRABILIS CULTURE REINCUBATED FOR BETTER GROWTH Performed at [facility], [place] [address], [place], [place] [address]    Report Status PENDING  Incomplete   Organism ID, Bacteria PROTEUS MIRABILIS  Final      Susceptibility   Proteus mirabilis - MIC*    AMPICILLIN <=2 SENSITIVE Sensitive     CEFAZOLIN <=4 SENSITIVE Sensitive     CEFEPIME <=1 SENSITIVE Sensitive     CEFTAZIDIME <=1 SENSITIVE Sensitive     CEFTRIAXONE <=1 SENSITIVE Sensitive     CIPROFLOXACIN <=0.25 SENSITIVE Sensitive     GENTAMICIN <=1 SENSITIVE Sensitive  IMIPENEM 4 SENSITIVE Sensitive     TRIMETH/SULFA <=20 SENSITIVE Sensitive     AMPICILLIN/SULBACTAM <=2 SENSITIVE Sensitive     PIP/TAZO <=4 SENSITIVE Sensitive     * MODERATE PROTEUS MIRABILIS         Radiology Studies: No results found.      Scheduled Meds: . enoxaparin (LOVENOX) injection  40 mg Subcutaneous Q24H  . feeding supplement (PRO-STAT SUGAR FREE 64)  30 mL Oral BID  . gabapentin  800 mg Oral QID  . insulin aspart  0-5 Units Subcutaneous QHS  . insulin aspart  0-9 Units Subcutaneous TID WC  . multivitamin with minerals  1 tablet Oral Daily  . nutrition supplement (JUVEN)  1 packet Oral BID BM   Continuous Infusions: . cefTRIAXone (ROCEPHIN)  IV Stopped (05/07/17 2330)     LOS: 5 days    Time spent: 35 minutes.     Hosie Poisson, MD [facility] Pager [phone]  If 7PM-7AM, please contact night-coverage [email or web address] Password [email or web address] 05/08/2017, 3:08 PM

## 2017-05-09 DIAGNOSIS — E11621 Type 2 diabetes mellitus with foot ulcer: Principal | ICD-10-CM

## 2017-05-09 DIAGNOSIS — L97519 Non-pressure chronic ulcer of other part of right foot with unspecified severity: Secondary | ICD-10-CM

## 2017-05-09 LAB — AEROBIC CULTURE  (SUPERFICIAL SPECIMEN)

## 2017-05-09 LAB — AEROBIC CULTURE W GRAM STAIN (SUPERFICIAL SPECIMEN)

## 2017-05-09 LAB — GLUCOSE, CAPILLARY: Glucose-Capillary: 102 mg/dL — ABNORMAL HIGH (ref 65–99)

## 2017-05-09 MED ORDER — TRAMADOL HCL 50 MG PO TABS: 100.0000 mg | ORAL_TABLET | Freq: Two times a day (BID) | ORAL | 0 refills | Status: DC | PRN

## 2017-05-09 MED ORDER — JUVEN PO PACK: 1.0000 | PACK | Freq: Two times a day (BID) | ORAL | 0 refills | Status: DC

## 2017-05-09 MED ORDER — SENNOSIDES-DOCUSATE SODIUM 8.6-50 MG PO TABS: 1.0000 | ORAL_TABLET | Freq: Every evening | ORAL | 0 refills | Status: DC | PRN

## 2017-05-09 MED ORDER — PRO-STAT SUGAR FREE PO LIQD: 30.0000 mL | Freq: Two times a day (BID) | ORAL | 0 refills | Status: DC

## 2017-05-09 MED ORDER — AMOXICILLIN-POT CLAVULANATE 875-125 MG PO TABS: 1.0000 | ORAL_TABLET | Freq: Two times a day (BID) | ORAL | 0 refills | Status: AC

## 2017-05-09 NOTE — Progress Notes (Signed)
EDUCATED PT AND SPOUSE ON DRESSING CHANGE. INSTRUCTED VERBALLY AND ASSISTED SPOUSE WITH CHANGE. SPOUSE WAS RECEPTIVE TO INSTRUCTION AND DEMO. CONTINUE TO MONITOR. PT TOLERATED DRESSING CHANGE WELL.

## 2017-05-09 NOTE — Discharge Instructions (Signed)
Twice daily damp to dry dressing changes to the wound.  You can shower but you need to dry the wound and foot very well following the shower. It is ok to have soap and water on the wound.  No soaking the wound.  Keep foot elevated as much as possible. Take your antibiotics as prescribed. Follow up with Podiatry as scheduled.  Diabetes and Foot Care Diabetes may cause you to have problems because of poor blood supply (circulation) to your feet and legs. This may cause the skin on your feet to become thinner, break easier, and heal more slowly. Your skin may become dry, and the skin may peel and crack. You may also have nerve damage in your legs and feet causing decreased feeling in them. You may not notice minor injuries to your feet that could lead to infections or more serious problems. Taking care of your feet is one of the most important things you can do for yourself. Follow these instructions at home:  Wear shoes at all times, even in the house. Do not go barefoot. Bare feet are easily injured.  Check your feet daily for blisters, cuts, and redness. If you cannot see the bottom of your feet, use a mirror or ask someone for help.  Wash your feet with warm water (do not use hot water) and mild soap. Then pat your feet and the areas between your toes until they are completely dry. Do not soak your feet as this can dry your skin.  Apply a moisturizing lotion or petroleum jelly (that does not contain alcohol and is unscented) to the skin on your feet and to dry, brittle toenails. Do not apply lotion between your toes.  Trim your toenails straight across. Do not dig under them or around the cuticle. File the edges of your nails with an emery board or nail file.  Do not cut corns or calluses or try to remove them with medicine.  Wear clean socks or stockings every day. Make sure they are not too tight. Do not wear knee-high stockings since they may decrease blood flow to your legs.  Wear shoes  that fit properly and have enough cushioning. To break in new shoes, wear them for just a few hours a day. This prevents you from injuring your feet. Always look in your shoes before you put them on to be sure there are no objects inside.  Do not cross your legs. This may decrease the blood flow to your feet.  If you find a minor scrape, cut, or break in the skin on your feet, keep it and the skin around it clean and dry. These areas may be cleansed with mild soap and water. Do not cleanse the area with peroxide, alcohol, or iodine.  When you remove an adhesive bandage, be sure not to damage the skin around it.  If you have a wound, look at it several times a day to make sure it is healing.  Do not use heating pads or hot water bottles. They may burn your skin. If you have lost feeling in your feet or legs, you may not know it is happening until it is too late.  Make sure your health care provider performs a complete foot exam at least annually or more often if you have foot problems. Report any cuts, sores, or bruises to your health care provider immediately. Contact a health care provider if:  You have an injury that is not healing.  You have cuts  or breaks in the skin.  You have an ingrown nail.  You notice redness on your legs or feet.  You feel burning or tingling in your legs or feet.  You have pain or cramps in your legs and feet.  Your legs or feet are numb.  Your feet always feel cold. Get help right away if:  There is increasing redness, swelling, or pain in or around a wound.  There is a red line that goes up your leg.  Pus is coming from a wound.  You develop a fever or as directed by your health care provider.  You notice a bad smell coming from an ulcer or wound. This information is not intended to replace advice given to you by your health care provider. Make sure you discuss any questions you have with your health care provider. Document Released: 04/24/2000  Document Revised: 10/03/2015 Document Reviewed: 10/04/2012 Elsevier Interactive Patient Education  2017 [employer].

## 2017-05-09 NOTE — Progress Notes (Signed)
[facility] Progress Note     Subjective: Doing well. Husband has learned to change the dressing. Feel better. Foot less swollen.   Objective: Vital signs in last 24 hours: Temp:  [98 F (36.7 C)-98.6 F (37 C)] 98.4 F (36.9 C) (12/30 0342) Pulse Rate:  [71-73] 73 (12/30 0342) Resp:  [16-18] 16 (12/30 0342) BP: (125-134)/(53-71) 134/61 (12/30 0342) SpO2:  [97 %-98 %] 98 % (12/30 0342) Last BM Date: 05/08/17  Intake/Output from previous day: 12/29 0701 - 12/30 0700 In: 960 [P.O.:960] Out: -  Intake/Output this shift: Total I/O In: 240 [P.O.:240] Out: -   General appearance: alert, cooperative and no distress Resp: normal work of breathing right foot wound with decreased swelling and erythema, wound improved with less sloughing and drainage  Lab Results:  Recent Labs    05/08/17 0629  WBC 7.0  HGB 11.8*  HCT 37.1  PLT 249   BMET Recent Labs    05/08/17 0709  NA 136  K 3.8  CL 103  CO2 22  GLUCOSE 102*  BUN 21*  CREATININE 0.61  CALCIUM 8.9   PT/INR No results for input(s): LABPROT, INR in the last 72 hours.  Studies/Results: No results found.  Anti-infectives: Anti-infectives (From admission, onward)   Start     Dose/Rate Route Frequency Ordered Stop   05/04/17 1800  vancomycin (VANCOCIN) IVPB 1000 mg/200 mL premix  Status:  Discontinued     1,000 mg 200 mL/hr over 60 Minutes Intravenous Every 24 hours 05/04/17 0833 05/08/17 1103   05/03/17 2300  cefTRIAXone (ROCEPHIN) 2 g in dextrose 5 % 50 mL IVPB     2 g 100 mL/hr over 30 Minutes Intravenous Every 24 hours 05/03/17 2255     05/03/17 2300  metroNIDAZOLE (FLAGYL) IVPB 500 mg  Status:  Discontinued     500 mg 100 mL/hr over 60 Minutes Intravenous Every 8 hours 05/03/17 2255 05/08/17 1103   05/03/17 2145  vancomycin (VANCOCIN) IVPB 1000 mg/200 mL premix     1,000 mg 200 mL/hr over 60 Minutes Intravenous  Once 05/03/17 2139 05/03/17 2322      Assessment/Plan: Ms. Hoeger is  a 83 yo with diabetic foot infection/ superficial abscess s/p bedside debridementX2. Improving greatly. Watched husband pack the wound and he did well.  -Bid dressing changes with saline dampened gauze ordered, pack deep in the inferior flap -Can shower but do not soak foot and dry very well following shower and repack -Home with 7 day course of antibiotics  -F/u with Podiatry on 05/13/2017 as previously scheduled -Podiatrist that patient has appointment with is Armida Sans, DPM, office # [phone]; I called and left a message regarding the patient    LOS: 6 days    Virl Cagey 05/09/2017

## 2017-05-10 LAB — GLUCOSE, CAPILLARY: GLUCOSE-CAPILLARY: 113 mg/dL — AB (ref 65–99)

## 2017-05-12 ENCOUNTER — Telehealth: Payer: Self-pay | Admitting: General Surgery

## 2017-05-12 NOTE — Telephone Encounter (Signed)
[facility]  Patient with a right diabetic superficial foot wound s/p debridement at the bedside. She has follow up with podiatry today for the first visit.    Armida Sans DPM is the podiatrist. I called and notified her of the wound and BID dressing changes.   Will fax the patient's records to her office.   Curlene Labrum, MD [facility] [address] [place], [place] [address] [phone] (office)

## 2017-05-13 DIAGNOSIS — L84 Corns and callosities: Secondary | ICD-10-CM | POA: Diagnosis not present

## 2017-05-13 DIAGNOSIS — E1351 Other specified diabetes mellitus with diabetic peripheral angiopathy without gangrene: Secondary | ICD-10-CM | POA: Diagnosis not present

## 2017-05-13 DIAGNOSIS — M21611 Bunion of right foot: Secondary | ICD-10-CM | POA: Diagnosis not present

## 2017-05-13 DIAGNOSIS — L602 Onychogryphosis: Secondary | ICD-10-CM | POA: Diagnosis not present

## 2017-05-13 DIAGNOSIS — M21961 Unspecified acquired deformity of right lower leg: Secondary | ICD-10-CM | POA: Diagnosis not present

## 2017-05-13 DIAGNOSIS — L97513 Non-pressure chronic ulcer of other part of right foot with necrosis of muscle: Secondary | ICD-10-CM | POA: Diagnosis not present

## 2017-05-13 NOTE — Discharge Summary (Addendum)
Physician Discharge Summary  Jessica Russell YQI:[id number] DOB: 09/07/1927 DOA: 05/03/2017  PCP: Raylene Everts, MD  Admit date: 05/03/2017 Discharge date: 05/09/2017  Admitted From: Home  Disposition:  HOme.   Recommendations for Outpatient Follow-up:  1. Follow up with PCP in 1-2 weeks 2. Please obtain BMP/CBC in one week 3. Please follow up with podiatry and surgery as recommended.   Home Health:yes   Discharge Condition:stable.  CODE STATUS: full code.  Diet recommendation: Heart Healthy   Brief/Interim Summary:  Jessica Reiteris a 84 y.o.femalewith medical history significant fortype 2 diabetes mellitus and peripheral neuropathy, now presenting to the emergency department for evaluation of a right foot ulcer with purulent drainage and surrounding erythema.    Discharge Diagnoses:  Principal Problem:   Cellulitis Active Problems:   Diabetic neuropathy associated with type 2 diabetes mellitus (HCC)   Diabetes mellitus type II, non insulin dependent (HCC)   Diabetic foot ulcer ([place])   Abscess of right foot  Diabetic foot ulcer with infection Improving, patient underwent bedside I&D with Dr. Constance Haw on 1226 and another I&D on 12/28.  Continue with  wound care as per Dr. Constance Haw recommendations.  Abscess cultures growing Proteus, pansensitive.  We will plan for discharge with 7 more days of oral antibiotics to complete the course.  Follow-up with podiatry on discharge as scheduled.    Type 2 diabetes mellitus CBG (last 3)  RecentLabs(last2labs)       Recent Labs    05/07/17 1705 05/08/17 0756 05/08/17 1131  GLUCAP 95 89 189*     Resume SSI.    HYPONATREMIA; resolved   Mild normocytic anemia probably from anemia of chronic disease. Stable.       Discharge Instructions  Discharge Instructions    Diet - low sodium heart healthy   Complete by:  As directed    Discharge instructions   Complete by:  As directed    Please follow  up with PCP in one week and follow up with Podiatry as recommended.     Allergies as of 05/09/2017      Reactions   Phenergan [promethazine Hcl] Other (See Comments)   Made delerious      Medication List    TAKE these medications   acetaminophen 500 MG tablet Commonly known as:  TYLENOL Take 500 mg by mouth every 6 (six) hours as needed for mild pain or moderate pain.   amoxicillin-clavulanate 875-125 MG tablet Commonly known as:  AUGMENTIN Take 1 tablet by mouth every 12 (twelve) hours for 7 days.   feeding supplement (PRO-STAT SUGAR FREE 64) Liqd Take 30 mLs by mouth 2 (two) times daily.   gabapentin 400 MG capsule Commonly known as:  NEURONTIN Take 800 mg 4 (four) times daily by mouth.   metFORMIN 500 MG tablet Commonly known as:  GLUCOPHAGE Take 500 mg by mouth daily after supper.   multivitamin capsule Take 1 capsule daily by mouth.   NON FORMULARY Take 2 capsules by mouth daily. [employer]   nutrition supplement (JUVEN) Pack Take 1 packet by mouth 2 (two) times daily between meals.   senna-docusate 8.6-50 MG tablet Commonly known as:  Senokot-S Take 1 tablet by mouth at bedtime as needed for mild constipation.   traMADol 50 MG tablet Commonly known as:  ULTRAM Take 2 tablets (100 mg total) by mouth every 12 (twelve) hours as needed for moderate pain.      Follow-up Information    Virl Cagey, MD Follow up.  Specialty:  General Surgery Why:  PRN Contact information: [address] [place] [facility] [address] [phone]        Raylene Everts, MD. Schedule an appointment as soon as possible for a visit in 1 week(s).   Specialty:  Family Medicine Contact information: [address] [place] [address] [phone]          Allergies  Allergen Reactions  . Phenergan [Promethazine Hcl] Other (See Comments)    Made delerious    Consultations:  Surgery.    Procedures/Studies: Mr Foot Right Wo Contrast  Result  Date: 05/05/2017 CLINICAL DATA:  Acute onset redness, pain and swelling of the right great toe 2 days ago. EXAM: MRI OF THE RIGHT FOREFOOT WITHOUT CONTRAST TECHNIQUE: Multiplanar, multisequence MR imaging of the right forefoot was performed. No intravenous contrast was administered. COMPARISON:  Plain films right foot 05/03/2017. FINDINGS: Bones/Joint/Cartilage There is no bone marrow signal abnormality to suggest osteomyelitis. No fracture or dislocation. No notable arthropathy. Ligaments Intact. Muscles and Tendons Intact.  There is fatty atrophy of intrinsic musculature the foot. Soft tissues The patient appears to have a skin ulceration on the plantar surface of the foot at the level of the first MTP joint. An underlying subcutaneous fluid collection measures 0.5 cm craniocaudal by 0.7 cm transverse by 0.9 cm long. Soft tissues of the foot are swollen. Soft tissue gas centered about the first MTP joint is identified. IMPRESSION: Cellulitis centered about the first MTP joint. A small subcutaneous fluid collection in the plantar soft tissues deep to the first MTP joint is compatible with abscess. Negative for septic joint or osteomyelitis. Electronically Signed   By: Inge Rise M.D.   On: 05/05/2017 09:50   US Arterial Abi (screening Lower Extremity)  Result Date: 05/05/2017 CLINICAL DATA:  Diabetic right foot ulcer. EXAM: NONINVASIVE PHYSIOLOGIC VASCULAR STUDY OF BILATERAL LOWER EXTREMITIES TECHNIQUE: Evaluation of both lower extremities were performed at rest, including calculation of ankle-brachial indices with single level Doppler, pressure and pulse volume recording. COMPARISON:  None. FINDINGS: Right ABI:  1.06 Left ABI:  0.95 Right Lower Extremity: Primarily monophasic waveforms in the right ankle. Left Lower Extremity: Normal waveforms at the left dorsalis pedis artery. IMPRESSION: Normal ankle-brachial indices. Electronically Signed   By: Markus Daft M.D.   On: 05/05/2017 09:55   Dg Foot  Complete Right  Result Date: 05/03/2017 CLINICAL DATA:  Acute onset of right foot erythema and swelling, particularly about the great toe. Chronic right foot pain. EXAM: RIGHT FOOT COMPLETE - 3+ VIEW COMPARISON:  None. FINDINGS: Scattered soft tissue air is noted along the medial and plantar aspects of the great toe, at the level of the first metatarsophalangeal joint, concerning for infection with a gas producing organism. Necrotizing fasciitis cannot be excluded. No definite osseous erosions are seen. Visualized joint spaces are grossly preserved. Scattered vascular calcifications are noted. The subtalar joint is unremarkable. There is no evidence of fracture or dislocation. IMPRESSION: Soft tissue air along the medial and plantar aspects of the great toe, at the level of the first metatarsophalangeal joint, concerning for infection with a gas producing organism. Necrotizing fasciitis cannot be excluded. No definite osseous erosions seen. These results were called by telephone at the time of interpretation on 05/03/2017 at 9:22 pm to Dr. Rogene Houston, who verbally acknowledged these results. Electronically Signed   By: Garald Balding M.D.   On: 05/03/2017 21:23       Subjective: No new complaints.   Discharge Exam: Vitals:  05/09/17 0342 05/09/17 1341  BP: 134/61 (!) 123/51  Pulse: 73 70  Resp: 16 18  Temp: 98.4 F (36.9 C) 97.7 F (36.5 C)  SpO2: 98% 100%   Vitals:   05/08/17 1545 05/08/17 2200 05/09/17 0342 05/09/17 1341  BP: (!) 125/53 129/71 134/61 (!) 123/51  Pulse: 71 71 73 70  Resp: 18 18 16 18   Temp: 98.6 F (37 C) 98 F (36.7 C) 98.4 F (36.9 C) 97.7 F (36.5 C)  TempSrc:  Oral Oral Oral  SpO2: 97% 97% 98% 100%  Weight:      Height:        General: Pt is alert, awake, not in acute distress Cardiovascular: RRR, S1/S2 +, no rubs, no gallops Respiratory: CTA bilaterally, no wheezing, no rhonchi Abdominal: Soft, NT, ND, bowel sounds + Extremities: no edema, no  cyanosis    The results of significant diagnostics from this hospitalization (including imaging, microbiology, ancillary and laboratory) are listed below for reference.     Microbiology: Recent Results (from the past 240 hour(s))  Culture, blood (Routine X 2) w Reflex to ID Panel     Status: None   Collection Time: 05/03/17 10:02 PM  Result Value Ref Range Status   Specimen Description BLOOD LEFT ARM  Final   Special Requests   Final    BOTTLES DRAWN AEROBIC AND ANAEROBIC Blood Culture adequate volume   Culture NO GROWTH 5 DAYS  Final   Report Status 05/08/2017 FINAL  Final  Culture, blood (Routine X 2) w Reflex to ID Panel     Status: None   Collection Time: 05/03/17 10:05 PM  Result Value Ref Range Status   Specimen Description BLOOD LEFT HAND  Final   Special Requests   Final    BOTTLES DRAWN AEROBIC AND ANAEROBIC Blood Culture results may not be optimal due to an inadequate volume of blood received in culture bottles   Culture NO GROWTH 5 DAYS  Final   Report Status 05/08/2017 FINAL  Final  Aerobic Culture (superficial specimen)     Status: None   Collection Time: 05/05/17  8:33 PM  Result Value Ref Range Status   Specimen Description ABSCESS  Final   Special Requests NONE  Final   Gram Stain   Final    RARE WBC PRESENT, PREDOMINANTLY MONONUCLEAR ABUNDANT GRAM NEGATIVE RODS MODERATE GRAM POSITIVE COCCI IN PAIRS AND CHAINS    Culture   Final    MODERATE PROTEUS MIRABILIS MODERATE GROUP B STREP(S.AGALACTIAE)ISOLATED TESTING AGAINST S. AGALACTIAE NOT ROUTINELY PERFORMED DUE TO PREDICTABILITY OF AMP/PEN/VAN SUSCEPTIBILITY. Performed at [facility], [place] [address] [place], [place] [address]    Report Status 05/09/2017 FINAL  Final   Organism ID, Bacteria PROTEUS MIRABILIS  Final      Susceptibility   Proteus mirabilis - MIC*    AMPICILLIN <=2 SENSITIVE Sensitive     CEFAZOLIN <=4 SENSITIVE Sensitive     CEFEPIME <=1 SENSITIVE Sensitive     CEFTAZIDIME <=1  SENSITIVE Sensitive     CEFTRIAXONE <=1 SENSITIVE Sensitive     CIPROFLOXACIN <=0.25 SENSITIVE Sensitive     GENTAMICIN <=1 SENSITIVE Sensitive     IMIPENEM 4 SENSITIVE Sensitive     TRIMETH/SULFA <=20 SENSITIVE Sensitive     AMPICILLIN/SULBACTAM <=2 SENSITIVE Sensitive     PIP/TAZO <=4 SENSITIVE Sensitive     * MODERATE PROTEUS MIRABILIS     Labs: BNP (last 3 results) No results for input(s): BNP in the last 8760 hours. Basic Metabolic  Panel: Recent Labs  Lab 05/08/17 0709  NA 136  K 3.8  CL 103  CO2 22  GLUCOSE 102*  BUN 21*  CREATININE 0.61  CALCIUM 8.9   Liver Function Tests: No results for input(s): AST, ALT, ALKPHOS, BILITOT, PROT, ALBUMIN in the last 168 hours. No results for input(s): LIPASE, AMYLASE in the last 168 hours. No results for input(s): AMMONIA in the last 168 hours. CBC: Recent Labs  Lab 05/08/17 0629  WBC 7.0  HGB 11.8*  HCT 37.1  MCV 87.1  PLT 249   Cardiac Enzymes: No results for input(s): CKTOTAL, CKMB, CKMBINDEX, TROPONINI in the last 168 hours. BNP: Invalid input(s): POCBNP CBG: Recent Labs  Lab 05/08/17 0756 05/08/17 1131 05/08/17 1647 05/08/17 2257 05/09/17 0754  GLUCAP 89 189* 82 107* 102*   D-Dimer No results for input(s): DDIMER in the last 72 hours. Hgb A1c No results for input(s): HGBA1C in the last 72 hours. Lipid Profile No results for input(s): CHOL, HDL, LDLCALC, TRIG, CHOLHDL, LDLDIRECT in the last 72 hours. Thyroid function studies No results for input(s): TSH, T4TOTAL, T3FREE, THYROIDAB in the last 72 hours.  Invalid input(s): FREET3 Anemia work up No results for input(s): VITAMINB12, FOLATE, FERRITIN, TIBC, IRON, RETICCTPCT in the last 72 hours. Urinalysis No results found for: COLORURINE, APPEARANCEUR, [place], [place], [place], [place], [place], [place], [place], UROBILINOGEN, NITRITE, LEUKOCYTESUR Sepsis Labs Invalid input(s): PROCALCITONIN,  WBC,  LACTICIDVEN Microbiology Recent Results (from  the past 240 hour(s))  Culture, blood (Routine X 2) w Reflex to ID Panel     Status: None   Collection Time: 05/03/17 10:02 PM  Result Value Ref Range Status   Specimen Description BLOOD LEFT ARM  Final   Special Requests   Final    BOTTLES DRAWN AEROBIC AND ANAEROBIC Blood Culture adequate volume   Culture NO GROWTH 5 DAYS  Final   Report Status 05/08/2017 FINAL  Final  Culture, blood (Routine X 2) w Reflex to ID Panel     Status: None   Collection Time: 05/03/17 10:05 PM  Result Value Ref Range Status   Specimen Description BLOOD LEFT HAND  Final   Special Requests   Final    BOTTLES DRAWN AEROBIC AND ANAEROBIC Blood Culture results may not be optimal due to an inadequate volume of blood received in culture bottles   Culture NO GROWTH 5 DAYS  Final   Report Status 05/08/2017 FINAL  Final  Aerobic Culture (superficial specimen)     Status: None   Collection Time: 05/05/17  8:33 PM  Result Value Ref Range Status   Specimen Description ABSCESS  Final   Special Requests NONE  Final   Gram Stain   Final    RARE WBC PRESENT, PREDOMINANTLY MONONUCLEAR ABUNDANT GRAM NEGATIVE RODS MODERATE GRAM POSITIVE COCCI IN PAIRS AND CHAINS    Culture   Final    MODERATE PROTEUS MIRABILIS MODERATE GROUP B STREP(S.AGALACTIAE)ISOLATED TESTING AGAINST S. AGALACTIAE NOT ROUTINELY PERFORMED DUE TO PREDICTABILITY OF AMP/PEN/VAN SUSCEPTIBILITY. Performed at [facility], [place] [address], [place], [place] [address]    Report Status 05/09/2017 FINAL  Final   Organism ID, Bacteria PROTEUS MIRABILIS  Final      Susceptibility   Proteus mirabilis - MIC*    AMPICILLIN <=2 SENSITIVE Sensitive     CEFAZOLIN <=4 SENSITIVE Sensitive     CEFEPIME <=1 SENSITIVE Sensitive     CEFTAZIDIME <=1 SENSITIVE Sensitive     CEFTRIAXONE <=1 SENSITIVE Sensitive     CIPROFLOXACIN <=0.25  SENSITIVE Sensitive     GENTAMICIN <=1 SENSITIVE Sensitive     IMIPENEM 4 SENSITIVE Sensitive     TRIMETH/SULFA <=20 SENSITIVE  Sensitive     AMPICILLIN/SULBACTAM <=2 SENSITIVE Sensitive     PIP/TAZO <=4 SENSITIVE Sensitive     * MODERATE PROTEUS MIRABILIS     Time coordinating discharge: Over 30 minutes  SIGNED:   Hosie Poisson, MD  [facility] 05/13/2017, 1:52 PM Pager   If 7PM-7AM, please contact night-coverage [email or web address] Password [email or web address]

## 2017-05-14 ENCOUNTER — Encounter: Payer: Self-pay | Admitting: Family Medicine

## 2017-05-14 ENCOUNTER — Ambulatory Visit (INDEPENDENT_AMBULATORY_CARE_PROVIDER_SITE_OTHER): Payer: Medicare Other | Admitting: Family Medicine

## 2017-05-14 ENCOUNTER — Other Ambulatory Visit: Payer: Self-pay

## 2017-05-14 VITALS — BP 138/86 | HR 72 | Temp 97.4°F | Resp 18 | Ht 63.0 in | Wt 171.0 lb

## 2017-05-14 DIAGNOSIS — E11621 Type 2 diabetes mellitus with foot ulcer: Secondary | ICD-10-CM | POA: Diagnosis not present

## 2017-05-14 DIAGNOSIS — L97513 Non-pressure chronic ulcer of other part of right foot with necrosis of muscle: Secondary | ICD-10-CM

## 2017-05-14 DIAGNOSIS — E118 Type 2 diabetes mellitus with unspecified complications: Secondary | ICD-10-CM | POA: Diagnosis not present

## 2017-05-14 DIAGNOSIS — E1142 Type 2 diabetes mellitus with diabetic polyneuropathy: Secondary | ICD-10-CM

## 2017-05-14 DIAGNOSIS — Z09 Encounter for follow-up examination after completed treatment for conditions other than malignant neoplasm: Secondary | ICD-10-CM

## 2017-05-14 NOTE — Progress Notes (Signed)
Chief Complaint  Patient presents with  . Transitions Of Care   Since patient was seen last she had a hospitalization for a diabetic foot ulcer.  She had infection that required IV antibiotics.  The ulcer was large and required debridement.  She was under the care of Dr. Constance Haw in surgery.  Her blood sugars were well controlled.  Her hemoglobin A1c is under 7.  Her blood pressure is well controlled.  There were no other lab abnormalities to follow-up.  I reviewed her chart, results, and her consultations with her today.  She has already seen podiatry in follow-up.  She will be going there every 2 weeks for wound care.  Her husband is doing dressing changes twice a day.  I spoke with both of them and he feels comfortable doing this.  He states that the podiatrist gave him a "salve" to put into the wound.  He does not know the name of this medication.  I will request those records. Patient is overdue for her diabetic eye exam.  She requests specifically referral to Dr. Leory Plowman.  This is placed in the chart.  She also has cataracts that require attention. As I reviewed her old records she was diagnosed with osteoporosis.  She states that she is unaware of this.  She has never taken any osteoporosis medication.  She does not get adequate calcium in her diet.  She is not on a vitamin D supplement.  We discussed calcium, vitamin D, and weightbearing exercises as ways of building bone.  In addition she likely should be on a bisphosphonate.  She does not want to start medicine at this visit because she still recovering from her foot ulcer, taking antibiotics.  She is on a low activity level until her ulcer heals, her podiatrist emphasized she must stay off the foot.  Patient Active Problem List   Diagnosis Date Noted  . Abscess of right foot   . Diabetes mellitus type II, non insulin dependent ([place]) 05/03/2017  . Diabetic foot ulcer ([place]) 05/03/2017  . Cellulitis 05/03/2017  . Diabetic foot  infection ([place])   . Chronic osteoarthritis 04/14/2017  . Episodic recurrent vertigo 04/14/2017  . Osteoporosis 04/14/2017  . Diabetic nephropathy ([place]) 03/24/2017  . Diabetic neuropathy associated with type 2 diabetes mellitus ([place]) 03/24/2017  . Cataracts, bilateral 03/24/2017  . History of bilateral breast cancer 03/24/2017  . Recurrent UTI 03/24/2017  . HLD (hyperlipidemia) 03/24/2017  . Diverticulosis 03/24/2017    Outpatient Encounter Medications as of 05/14/2017  Medication Sig  . acetaminophen (TYLENOL) 500 MG tablet Take 500 mg by mouth every 6 (six) hours as needed for mild pain or moderate pain.  . Amino Acids-Protein Hydrolys (FEEDING SUPPLEMENT, PRO-STAT SUGAR FREE 64,) LIQD Take 30 mLs by mouth 2 (two) times daily.  [name] amoxicillin-clavulanate (AUGMENTIN) 875-125 MG tablet Take 1 tablet by mouth every 12 (twelve) hours for 7 days.  [name] gabapentin (NEURONTIN) 400 MG capsule Take 800 mg 4 (four) times daily by mouth.  . metFORMIN (GLUCOPHAGE) 500 MG tablet Take 500 mg by mouth daily after supper.   . Multiple Vitamin (MULTIVITAMIN) capsule Take 1 capsule daily by mouth.  . NON FORMULARY Take 2 capsules by mouth daily. [facility]  . nutrition supplement, JUVEN, (JUVEN) PACK Take 1 packet by mouth 2 (two) times daily between meals.  . senna-docusate (SENOKOT-S) 8.6-50 MG tablet Take 1 tablet by mouth at bedtime as needed for mild constipation.  . traMADol (ULTRAM) 50 MG tablet  Take 2 tablets (100 mg total) by mouth every 12 (twelve) hours as needed for moderate pain.   No facility-administered encounter medications on file as of 05/14/2017.     Allergies  Allergen Reactions  . Phenergan [Promethazine Hcl] Other (See Comments)    Made delerious    Review of Systems  Constitutional: Negative for activity change, appetite change and unexpected weight change.       Has lost 6 pounds  HENT: Negative for congestion, dental problem, postnasal drip and rhinorrhea.   Eyes: Positive for  visual disturbance. Negative for redness.       Known cataracts  Respiratory: Negative for cough and shortness of breath.   Cardiovascular: Positive for leg swelling. Negative for chest pain and palpitations.       Mild edema reported  Gastrointestinal: Positive for diarrhea. Negative for abdominal pain and constipation.       States loose bowels from antibiotics  Genitourinary: Negative for difficulty urinating and frequency.  Musculoskeletal: Positive for gait problem. Negative for arthralgias and back pain.       In boot left foot and ankle  Skin: Positive for wound.       Diabetic ulcer left great toe  Neurological: Negative for dizziness and headaches.  Psychiatric/Behavioral: Negative for dysphoric mood and sleep disturbance. The patient is not nervous/anxious.     BP 138/86   Pulse 72   Temp (!) 97.4 F (36.3 C) (Temporal)   Resp 18   Ht 5\' 3"  (1.6 m)   Wt 171 lb 0.6 oz (77.6 kg)   SpO2 100%   BMI 30.30 kg/m   Physical Exam  Constitutional: She is oriented to person, place, and time. She appears well-developed and well-nourished. No distress.  Good spirits, smiling.  Pleasant elderly woman who appears stated age.  Truncal obesity.  HENT:  Head: Normocephalic and atraumatic.  Mouth/Throat: Oropharynx is clear and moist.  Eyes: Conjunctivae are normal. Pupils are equal, round, and reactive to light.  Neck: Normal range of motion.  Cardiovascular: Normal rate, regular rhythm and normal heart sounds.  No ectopy  Pulmonary/Chest: Effort normal and breath sounds normal. She has no wheezes.  Musculoskeletal:  dressing not removed left  Lymphadenopathy:    She has no cervical adenopathy.  Neurological: She is alert and oriented to person, place, and time.  Psychiatric: She has a normal mood and affect. Her behavior is normal.    ASSESSMENT/PLAN:  1. Controlled type 2 diabetes mellitus with complication, without long-term current use of insulin (HCC) A1c under 7 -  Ambulatory referral to Ophthalmology  2. Diabetic polyneuropathy associated with type 2 diabetes mellitus (HCC) Diabetic foot ulcer.  Fortunately, not painful.  Continues antibiotics.  3. Hospital discharge follow-up Compliant with medical recommendations.  Doing dressing changes.  No fever or complications.  3 more days of antibiotics.  4. Diabetic ulcer of toe of right foot associated with type 2 diabetes mellitus, with necrosis of muscle ([place]) Under care of podiatry.  Those records have been requested.  Greater than 50% of this visit was spent in counseling and coordinating care.  Total face to face time:   5 minutes in hospital review, medicine reconciliation, discussion of plan and general wound care for diabetic ulcers    Patient Instructions  Continue under care of podiatry I will review her notes  See me in one month Call sooner for problems  Referred to Dr Geoffry Paradise for Eyes   Raylene Everts, MD

## 2017-05-14 NOTE — Patient Instructions (Signed)
Continue under care of podiatry I will review her notes  See me in one month Call sooner for problems  Referred to Dr Geoffry Paradise for Eyes

## 2017-05-19 ENCOUNTER — Telehealth: Payer: Self-pay | Admitting: Family Medicine

## 2017-05-19 DIAGNOSIS — L989 Disorder of the skin and subcutaneous tissue, unspecified: Secondary | ICD-10-CM

## 2017-05-19 NOTE — Telephone Encounter (Signed)
Patient is requesting a dermatology referral to dr.john hall for a spot on her face in between her eyebrows.

## 2017-05-26 ENCOUNTER — Telehealth: Payer: Self-pay | Admitting: Family Medicine

## 2017-05-26 DIAGNOSIS — M21611 Bunion of right foot: Secondary | ICD-10-CM | POA: Diagnosis not present

## 2017-05-26 DIAGNOSIS — E1351 Other specified diabetes mellitus with diabetic peripheral angiopathy without gangrene: Secondary | ICD-10-CM | POA: Diagnosis not present

## 2017-05-26 DIAGNOSIS — L97512 Non-pressure chronic ulcer of other part of right foot with fat layer exposed: Secondary | ICD-10-CM | POA: Diagnosis not present

## 2017-05-26 DIAGNOSIS — G629 Polyneuropathy, unspecified: Secondary | ICD-10-CM | POA: Diagnosis not present

## 2017-05-26 DIAGNOSIS — M21961 Unspecified acquired deformity of right lower leg: Secondary | ICD-10-CM | POA: Diagnosis not present

## 2017-05-26 DIAGNOSIS — M19071 Primary osteoarthritis, right ankle and foot: Secondary | ICD-10-CM | POA: Diagnosis not present

## 2017-05-26 MED ORDER — UNABLE TO FIND: 0 refills | Status: DC

## 2017-05-26 NOTE — Telephone Encounter (Signed)
This has been done and faxed. Left message for [place].

## 2017-05-26 NOTE — Telephone Encounter (Signed)
Patient seen podiatrist today "[facility]" Dr.Aj states patient needs a wheelchair to keep off her foot. She is requesting that primary care doctor write this script because she cant . patient does not know why other than insurance purposes.  Can send this prescription  wherever she can get this done in [place]. Cb# [phone]

## 2017-05-26 NOTE — Telephone Encounter (Signed)
Pt cant use a regular wheelchair, (do to older house with narrow doors) the patient went to [place] and [employer] [employer] wheelchair will work, however they will need a new prescription with the orders and notes.

## 2017-05-27 ENCOUNTER — Telehealth: Payer: Self-pay | Admitting: Family Medicine

## 2017-05-27 MED ORDER — UNABLE TO FIND: 0 refills | Status: DC

## 2017-05-27 NOTE — Telephone Encounter (Signed)
Jessica Russell at [employer] requesting a call or fax regarding notes for transport wheelchair

## 2017-05-28 DIAGNOSIS — M542 Cervicalgia: Secondary | ICD-10-CM | POA: Diagnosis not present

## 2017-05-28 DIAGNOSIS — M9901 Segmental and somatic dysfunction of cervical region: Secondary | ICD-10-CM | POA: Diagnosis not present

## 2017-05-28 DIAGNOSIS — M546 Pain in thoracic spine: Secondary | ICD-10-CM | POA: Diagnosis not present

## 2017-05-28 DIAGNOSIS — M9902 Segmental and somatic dysfunction of thoracic region: Secondary | ICD-10-CM | POA: Diagnosis not present

## 2017-05-31 ENCOUNTER — Telehealth: Payer: Self-pay | Admitting: Family Medicine

## 2017-05-31 NOTE — Telephone Encounter (Signed)
Pt left message , stating that [facility] needs a written statement regarding the wheelchair

## 2017-05-31 NOTE — Telephone Encounter (Signed)
If they require office notes that justify the need for a wheelchair the patient needs to make an appointment to see me.  We ordered a wheelchair based on the recommendation of a podiatrist.  Alternatively, the podiatrist can do the paperwork.

## 2017-05-31 NOTE — Telephone Encounter (Signed)
It looks like patient needs a wheelchair per another doctor. But it also looks like [facility] has sent the prescription and they are needing notes or a letter to support the wheelchair? But her last office notes here do not mention the need for a wheelchair. Please advise.

## 2017-06-01 DIAGNOSIS — M542 Cervicalgia: Secondary | ICD-10-CM | POA: Diagnosis not present

## 2017-06-01 DIAGNOSIS — M546 Pain in thoracic spine: Secondary | ICD-10-CM | POA: Diagnosis not present

## 2017-06-01 DIAGNOSIS — M9902 Segmental and somatic dysfunction of thoracic region: Secondary | ICD-10-CM | POA: Diagnosis not present

## 2017-06-01 DIAGNOSIS — M9901 Segmental and somatic dysfunction of cervical region: Secondary | ICD-10-CM | POA: Diagnosis not present

## 2017-06-01 NOTE — Telephone Encounter (Signed)
Patient states she has gotten one another way that she is just paying for.

## 2017-06-04 DIAGNOSIS — M9902 Segmental and somatic dysfunction of thoracic region: Secondary | ICD-10-CM | POA: Diagnosis not present

## 2017-06-04 DIAGNOSIS — M9901 Segmental and somatic dysfunction of cervical region: Secondary | ICD-10-CM | POA: Diagnosis not present

## 2017-06-04 DIAGNOSIS — M546 Pain in thoracic spine: Secondary | ICD-10-CM | POA: Diagnosis not present

## 2017-06-04 DIAGNOSIS — M542 Cervicalgia: Secondary | ICD-10-CM | POA: Diagnosis not present

## 2017-06-09 DIAGNOSIS — E1351 Other specified diabetes mellitus with diabetic peripheral angiopathy without gangrene: Secondary | ICD-10-CM | POA: Diagnosis not present

## 2017-06-09 DIAGNOSIS — L97512 Non-pressure chronic ulcer of other part of right foot with fat layer exposed: Secondary | ICD-10-CM | POA: Diagnosis not present

## 2017-06-10 ENCOUNTER — Ambulatory Visit (INDEPENDENT_AMBULATORY_CARE_PROVIDER_SITE_OTHER): Payer: Medicare Other | Admitting: Otolaryngology

## 2017-06-10 DIAGNOSIS — J343 Hypertrophy of nasal turbinates: Secondary | ICD-10-CM

## 2017-06-10 DIAGNOSIS — J342 Deviated nasal septum: Secondary | ICD-10-CM

## 2017-06-15 DIAGNOSIS — L03116 Cellulitis of left lower limb: Secondary | ICD-10-CM | POA: Diagnosis not present

## 2017-06-15 DIAGNOSIS — M21611 Bunion of right foot: Secondary | ICD-10-CM | POA: Diagnosis not present

## 2017-06-15 DIAGNOSIS — L97512 Non-pressure chronic ulcer of other part of right foot with fat layer exposed: Secondary | ICD-10-CM | POA: Diagnosis not present

## 2017-06-15 DIAGNOSIS — E1351 Other specified diabetes mellitus with diabetic peripheral angiopathy without gangrene: Secondary | ICD-10-CM | POA: Diagnosis not present

## 2017-06-15 NOTE — Telephone Encounter (Signed)
Close  

## 2017-06-16 DIAGNOSIS — M9901 Segmental and somatic dysfunction of cervical region: Secondary | ICD-10-CM | POA: Diagnosis not present

## 2017-06-16 DIAGNOSIS — M50322 Other cervical disc degeneration at C5-C6 level: Secondary | ICD-10-CM | POA: Diagnosis not present

## 2017-06-18 ENCOUNTER — Ambulatory Visit: Payer: Medicare Other | Admitting: Family Medicine

## 2017-06-18 DIAGNOSIS — M50322 Other cervical disc degeneration at C5-C6 level: Secondary | ICD-10-CM | POA: Diagnosis not present

## 2017-06-18 DIAGNOSIS — M9901 Segmental and somatic dysfunction of cervical region: Secondary | ICD-10-CM | POA: Diagnosis not present

## 2017-06-19 DIAGNOSIS — Z4800 Encounter for change or removal of nonsurgical wound dressing: Secondary | ICD-10-CM | POA: Diagnosis not present

## 2017-06-19 DIAGNOSIS — L03116 Cellulitis of left lower limb: Secondary | ICD-10-CM | POA: Diagnosis not present

## 2017-06-19 DIAGNOSIS — Z7984 Long term (current) use of oral hypoglycemic drugs: Secondary | ICD-10-CM | POA: Diagnosis not present

## 2017-06-19 DIAGNOSIS — M21611 Bunion of right foot: Secondary | ICD-10-CM | POA: Diagnosis not present

## 2017-06-19 DIAGNOSIS — E11621 Type 2 diabetes mellitus with foot ulcer: Secondary | ICD-10-CM | POA: Diagnosis not present

## 2017-06-19 DIAGNOSIS — E1151 Type 2 diabetes mellitus with diabetic peripheral angiopathy without gangrene: Secondary | ICD-10-CM | POA: Diagnosis not present

## 2017-06-19 DIAGNOSIS — L97512 Non-pressure chronic ulcer of other part of right foot with fat layer exposed: Secondary | ICD-10-CM | POA: Diagnosis not present

## 2017-06-21 DIAGNOSIS — Z4800 Encounter for change or removal of nonsurgical wound dressing: Secondary | ICD-10-CM | POA: Diagnosis not present

## 2017-06-21 DIAGNOSIS — E11621 Type 2 diabetes mellitus with foot ulcer: Secondary | ICD-10-CM | POA: Diagnosis not present

## 2017-06-21 DIAGNOSIS — M21611 Bunion of right foot: Secondary | ICD-10-CM | POA: Diagnosis not present

## 2017-06-21 DIAGNOSIS — E1151 Type 2 diabetes mellitus with diabetic peripheral angiopathy without gangrene: Secondary | ICD-10-CM | POA: Diagnosis not present

## 2017-06-21 DIAGNOSIS — L03116 Cellulitis of left lower limb: Secondary | ICD-10-CM | POA: Diagnosis not present

## 2017-06-21 DIAGNOSIS — M9901 Segmental and somatic dysfunction of cervical region: Secondary | ICD-10-CM | POA: Diagnosis not present

## 2017-06-21 DIAGNOSIS — M50322 Other cervical disc degeneration at C5-C6 level: Secondary | ICD-10-CM | POA: Diagnosis not present

## 2017-06-21 DIAGNOSIS — L97512 Non-pressure chronic ulcer of other part of right foot with fat layer exposed: Secondary | ICD-10-CM | POA: Diagnosis not present

## 2017-06-22 DIAGNOSIS — L97512 Non-pressure chronic ulcer of other part of right foot with fat layer exposed: Secondary | ICD-10-CM | POA: Diagnosis not present

## 2017-06-22 DIAGNOSIS — M21611 Bunion of right foot: Secondary | ICD-10-CM | POA: Diagnosis not present

## 2017-06-23 DIAGNOSIS — E1151 Type 2 diabetes mellitus with diabetic peripheral angiopathy without gangrene: Secondary | ICD-10-CM | POA: Diagnosis not present

## 2017-06-23 DIAGNOSIS — M9901 Segmental and somatic dysfunction of cervical region: Secondary | ICD-10-CM | POA: Diagnosis not present

## 2017-06-23 DIAGNOSIS — Z4800 Encounter for change or removal of nonsurgical wound dressing: Secondary | ICD-10-CM | POA: Diagnosis not present

## 2017-06-23 DIAGNOSIS — M50322 Other cervical disc degeneration at C5-C6 level: Secondary | ICD-10-CM | POA: Diagnosis not present

## 2017-06-23 DIAGNOSIS — L97512 Non-pressure chronic ulcer of other part of right foot with fat layer exposed: Secondary | ICD-10-CM | POA: Diagnosis not present

## 2017-06-23 DIAGNOSIS — L03116 Cellulitis of left lower limb: Secondary | ICD-10-CM | POA: Diagnosis not present

## 2017-06-23 DIAGNOSIS — M21611 Bunion of right foot: Secondary | ICD-10-CM | POA: Diagnosis not present

## 2017-06-23 DIAGNOSIS — E11621 Type 2 diabetes mellitus with foot ulcer: Secondary | ICD-10-CM | POA: Diagnosis not present

## 2017-06-25 DIAGNOSIS — E11621 Type 2 diabetes mellitus with foot ulcer: Secondary | ICD-10-CM | POA: Diagnosis not present

## 2017-06-25 DIAGNOSIS — Z4800 Encounter for change or removal of nonsurgical wound dressing: Secondary | ICD-10-CM | POA: Diagnosis not present

## 2017-06-25 DIAGNOSIS — E1151 Type 2 diabetes mellitus with diabetic peripheral angiopathy without gangrene: Secondary | ICD-10-CM | POA: Diagnosis not present

## 2017-06-25 DIAGNOSIS — L97512 Non-pressure chronic ulcer of other part of right foot with fat layer exposed: Secondary | ICD-10-CM | POA: Diagnosis not present

## 2017-06-25 DIAGNOSIS — L03116 Cellulitis of left lower limb: Secondary | ICD-10-CM | POA: Diagnosis not present

## 2017-06-25 DIAGNOSIS — M21611 Bunion of right foot: Secondary | ICD-10-CM | POA: Diagnosis not present

## 2017-06-28 DIAGNOSIS — Z4800 Encounter for change or removal of nonsurgical wound dressing: Secondary | ICD-10-CM | POA: Diagnosis not present

## 2017-06-28 DIAGNOSIS — M21611 Bunion of right foot: Secondary | ICD-10-CM | POA: Diagnosis not present

## 2017-06-28 DIAGNOSIS — E11621 Type 2 diabetes mellitus with foot ulcer: Secondary | ICD-10-CM | POA: Diagnosis not present

## 2017-06-28 DIAGNOSIS — L97512 Non-pressure chronic ulcer of other part of right foot with fat layer exposed: Secondary | ICD-10-CM | POA: Diagnosis not present

## 2017-06-28 DIAGNOSIS — M50322 Other cervical disc degeneration at C5-C6 level: Secondary | ICD-10-CM | POA: Diagnosis not present

## 2017-06-28 DIAGNOSIS — E1151 Type 2 diabetes mellitus with diabetic peripheral angiopathy without gangrene: Secondary | ICD-10-CM | POA: Diagnosis not present

## 2017-06-28 DIAGNOSIS — L03116 Cellulitis of left lower limb: Secondary | ICD-10-CM | POA: Diagnosis not present

## 2017-06-28 DIAGNOSIS — M9901 Segmental and somatic dysfunction of cervical region: Secondary | ICD-10-CM | POA: Diagnosis not present

## 2017-06-30 DIAGNOSIS — E11621 Type 2 diabetes mellitus with foot ulcer: Secondary | ICD-10-CM | POA: Diagnosis not present

## 2017-06-30 DIAGNOSIS — L97512 Non-pressure chronic ulcer of other part of right foot with fat layer exposed: Secondary | ICD-10-CM | POA: Diagnosis not present

## 2017-06-30 DIAGNOSIS — Z4800 Encounter for change or removal of nonsurgical wound dressing: Secondary | ICD-10-CM | POA: Diagnosis not present

## 2017-06-30 DIAGNOSIS — M50322 Other cervical disc degeneration at C5-C6 level: Secondary | ICD-10-CM | POA: Diagnosis not present

## 2017-06-30 DIAGNOSIS — L03116 Cellulitis of left lower limb: Secondary | ICD-10-CM | POA: Diagnosis not present

## 2017-06-30 DIAGNOSIS — M9901 Segmental and somatic dysfunction of cervical region: Secondary | ICD-10-CM | POA: Diagnosis not present

## 2017-06-30 DIAGNOSIS — M21611 Bunion of right foot: Secondary | ICD-10-CM | POA: Diagnosis not present

## 2017-06-30 DIAGNOSIS — E1151 Type 2 diabetes mellitus with diabetic peripheral angiopathy without gangrene: Secondary | ICD-10-CM | POA: Diagnosis not present

## 2017-07-02 DIAGNOSIS — L97512 Non-pressure chronic ulcer of other part of right foot with fat layer exposed: Secondary | ICD-10-CM | POA: Diagnosis not present

## 2017-07-02 DIAGNOSIS — M21611 Bunion of right foot: Secondary | ICD-10-CM | POA: Diagnosis not present

## 2017-07-02 DIAGNOSIS — M9901 Segmental and somatic dysfunction of cervical region: Secondary | ICD-10-CM | POA: Diagnosis not present

## 2017-07-02 DIAGNOSIS — E1151 Type 2 diabetes mellitus with diabetic peripheral angiopathy without gangrene: Secondary | ICD-10-CM | POA: Diagnosis not present

## 2017-07-02 DIAGNOSIS — Z4800 Encounter for change or removal of nonsurgical wound dressing: Secondary | ICD-10-CM | POA: Diagnosis not present

## 2017-07-02 DIAGNOSIS — E11621 Type 2 diabetes mellitus with foot ulcer: Secondary | ICD-10-CM | POA: Diagnosis not present

## 2017-07-02 DIAGNOSIS — M50322 Other cervical disc degeneration at C5-C6 level: Secondary | ICD-10-CM | POA: Diagnosis not present

## 2017-07-02 DIAGNOSIS — L03116 Cellulitis of left lower limb: Secondary | ICD-10-CM | POA: Diagnosis not present

## 2017-07-05 DIAGNOSIS — E11621 Type 2 diabetes mellitus with foot ulcer: Secondary | ICD-10-CM | POA: Diagnosis not present

## 2017-07-05 DIAGNOSIS — M50322 Other cervical disc degeneration at C5-C6 level: Secondary | ICD-10-CM | POA: Diagnosis not present

## 2017-07-05 DIAGNOSIS — L03116 Cellulitis of left lower limb: Secondary | ICD-10-CM | POA: Diagnosis not present

## 2017-07-05 DIAGNOSIS — E1151 Type 2 diabetes mellitus with diabetic peripheral angiopathy without gangrene: Secondary | ICD-10-CM | POA: Diagnosis not present

## 2017-07-05 DIAGNOSIS — L97512 Non-pressure chronic ulcer of other part of right foot with fat layer exposed: Secondary | ICD-10-CM | POA: Diagnosis not present

## 2017-07-05 DIAGNOSIS — Z4800 Encounter for change or removal of nonsurgical wound dressing: Secondary | ICD-10-CM | POA: Diagnosis not present

## 2017-07-05 DIAGNOSIS — M21611 Bunion of right foot: Secondary | ICD-10-CM | POA: Diagnosis not present

## 2017-07-05 DIAGNOSIS — M9901 Segmental and somatic dysfunction of cervical region: Secondary | ICD-10-CM | POA: Diagnosis not present

## 2017-07-07 DIAGNOSIS — M9901 Segmental and somatic dysfunction of cervical region: Secondary | ICD-10-CM | POA: Diagnosis not present

## 2017-07-07 DIAGNOSIS — M50322 Other cervical disc degeneration at C5-C6 level: Secondary | ICD-10-CM | POA: Diagnosis not present

## 2017-07-07 DIAGNOSIS — E1351 Other specified diabetes mellitus with diabetic peripheral angiopathy without gangrene: Secondary | ICD-10-CM | POA: Diagnosis not present

## 2017-07-07 DIAGNOSIS — L97512 Non-pressure chronic ulcer of other part of right foot with fat layer exposed: Secondary | ICD-10-CM | POA: Diagnosis not present

## 2017-07-09 DIAGNOSIS — Z4800 Encounter for change or removal of nonsurgical wound dressing: Secondary | ICD-10-CM | POA: Diagnosis not present

## 2017-07-09 DIAGNOSIS — M21611 Bunion of right foot: Secondary | ICD-10-CM | POA: Diagnosis not present

## 2017-07-09 DIAGNOSIS — E1151 Type 2 diabetes mellitus with diabetic peripheral angiopathy without gangrene: Secondary | ICD-10-CM | POA: Diagnosis not present

## 2017-07-09 DIAGNOSIS — L97512 Non-pressure chronic ulcer of other part of right foot with fat layer exposed: Secondary | ICD-10-CM | POA: Diagnosis not present

## 2017-07-09 DIAGNOSIS — E11621 Type 2 diabetes mellitus with foot ulcer: Secondary | ICD-10-CM | POA: Diagnosis not present

## 2017-07-09 DIAGNOSIS — L03116 Cellulitis of left lower limb: Secondary | ICD-10-CM | POA: Diagnosis not present

## 2017-07-12 DIAGNOSIS — L97512 Non-pressure chronic ulcer of other part of right foot with fat layer exposed: Secondary | ICD-10-CM | POA: Diagnosis not present

## 2017-07-12 DIAGNOSIS — E1151 Type 2 diabetes mellitus with diabetic peripheral angiopathy without gangrene: Secondary | ICD-10-CM | POA: Diagnosis not present

## 2017-07-12 DIAGNOSIS — M21611 Bunion of right foot: Secondary | ICD-10-CM | POA: Diagnosis not present

## 2017-07-12 DIAGNOSIS — M9901 Segmental and somatic dysfunction of cervical region: Secondary | ICD-10-CM | POA: Diagnosis not present

## 2017-07-12 DIAGNOSIS — M50322 Other cervical disc degeneration at C5-C6 level: Secondary | ICD-10-CM | POA: Diagnosis not present

## 2017-07-12 DIAGNOSIS — Z4800 Encounter for change or removal of nonsurgical wound dressing: Secondary | ICD-10-CM | POA: Diagnosis not present

## 2017-07-12 DIAGNOSIS — L03116 Cellulitis of left lower limb: Secondary | ICD-10-CM | POA: Diagnosis not present

## 2017-07-12 DIAGNOSIS — E11621 Type 2 diabetes mellitus with foot ulcer: Secondary | ICD-10-CM | POA: Diagnosis not present

## 2017-07-14 DIAGNOSIS — E1151 Type 2 diabetes mellitus with diabetic peripheral angiopathy without gangrene: Secondary | ICD-10-CM | POA: Diagnosis not present

## 2017-07-14 DIAGNOSIS — L97512 Non-pressure chronic ulcer of other part of right foot with fat layer exposed: Secondary | ICD-10-CM | POA: Diagnosis not present

## 2017-07-14 DIAGNOSIS — M19071 Primary osteoarthritis, right ankle and foot: Secondary | ICD-10-CM | POA: Diagnosis not present

## 2017-07-14 DIAGNOSIS — M79671 Pain in right foot: Secondary | ICD-10-CM | POA: Diagnosis not present

## 2017-07-14 DIAGNOSIS — M79672 Pain in left foot: Secondary | ICD-10-CM | POA: Diagnosis not present

## 2017-07-14 DIAGNOSIS — B351 Tinea unguium: Secondary | ICD-10-CM | POA: Diagnosis not present

## 2017-07-14 DIAGNOSIS — M21611 Bunion of right foot: Secondary | ICD-10-CM | POA: Diagnosis not present

## 2017-07-15 DIAGNOSIS — M50322 Other cervical disc degeneration at C5-C6 level: Secondary | ICD-10-CM | POA: Diagnosis not present

## 2017-07-15 DIAGNOSIS — M9901 Segmental and somatic dysfunction of cervical region: Secondary | ICD-10-CM | POA: Diagnosis not present

## 2017-07-16 DIAGNOSIS — E1151 Type 2 diabetes mellitus with diabetic peripheral angiopathy without gangrene: Secondary | ICD-10-CM | POA: Diagnosis not present

## 2017-07-16 DIAGNOSIS — E11621 Type 2 diabetes mellitus with foot ulcer: Secondary | ICD-10-CM | POA: Diagnosis not present

## 2017-07-16 DIAGNOSIS — M21611 Bunion of right foot: Secondary | ICD-10-CM | POA: Diagnosis not present

## 2017-07-16 DIAGNOSIS — L03116 Cellulitis of left lower limb: Secondary | ICD-10-CM | POA: Diagnosis not present

## 2017-07-16 DIAGNOSIS — L97512 Non-pressure chronic ulcer of other part of right foot with fat layer exposed: Secondary | ICD-10-CM | POA: Diagnosis not present

## 2017-07-16 DIAGNOSIS — Z4800 Encounter for change or removal of nonsurgical wound dressing: Secondary | ICD-10-CM | POA: Diagnosis not present

## 2017-07-19 DIAGNOSIS — E11621 Type 2 diabetes mellitus with foot ulcer: Secondary | ICD-10-CM | POA: Diagnosis not present

## 2017-07-19 DIAGNOSIS — M9901 Segmental and somatic dysfunction of cervical region: Secondary | ICD-10-CM | POA: Diagnosis not present

## 2017-07-19 DIAGNOSIS — M50322 Other cervical disc degeneration at C5-C6 level: Secondary | ICD-10-CM | POA: Diagnosis not present

## 2017-07-19 DIAGNOSIS — L97512 Non-pressure chronic ulcer of other part of right foot with fat layer exposed: Secondary | ICD-10-CM | POA: Diagnosis not present

## 2017-07-19 DIAGNOSIS — Z4800 Encounter for change or removal of nonsurgical wound dressing: Secondary | ICD-10-CM | POA: Diagnosis not present

## 2017-07-19 DIAGNOSIS — L03116 Cellulitis of left lower limb: Secondary | ICD-10-CM | POA: Diagnosis not present

## 2017-07-19 DIAGNOSIS — M21611 Bunion of right foot: Secondary | ICD-10-CM | POA: Diagnosis not present

## 2017-07-19 DIAGNOSIS — E1151 Type 2 diabetes mellitus with diabetic peripheral angiopathy without gangrene: Secondary | ICD-10-CM | POA: Diagnosis not present

## 2017-07-21 DIAGNOSIS — L97512 Non-pressure chronic ulcer of other part of right foot with fat layer exposed: Secondary | ICD-10-CM | POA: Diagnosis not present

## 2017-07-21 DIAGNOSIS — M50322 Other cervical disc degeneration at C5-C6 level: Secondary | ICD-10-CM | POA: Diagnosis not present

## 2017-07-21 DIAGNOSIS — M9901 Segmental and somatic dysfunction of cervical region: Secondary | ICD-10-CM | POA: Diagnosis not present

## 2017-07-21 DIAGNOSIS — E1151 Type 2 diabetes mellitus with diabetic peripheral angiopathy without gangrene: Secondary | ICD-10-CM | POA: Diagnosis not present

## 2017-07-23 ENCOUNTER — Ambulatory Visit: Payer: Medicare Other | Admitting: Family Medicine

## 2017-07-23 DIAGNOSIS — E11621 Type 2 diabetes mellitus with foot ulcer: Secondary | ICD-10-CM | POA: Diagnosis not present

## 2017-07-23 DIAGNOSIS — E1151 Type 2 diabetes mellitus with diabetic peripheral angiopathy without gangrene: Secondary | ICD-10-CM | POA: Diagnosis not present

## 2017-07-23 DIAGNOSIS — L97512 Non-pressure chronic ulcer of other part of right foot with fat layer exposed: Secondary | ICD-10-CM | POA: Diagnosis not present

## 2017-07-23 DIAGNOSIS — M21611 Bunion of right foot: Secondary | ICD-10-CM | POA: Diagnosis not present

## 2017-07-23 DIAGNOSIS — L03116 Cellulitis of left lower limb: Secondary | ICD-10-CM | POA: Diagnosis not present

## 2017-07-23 DIAGNOSIS — Z4800 Encounter for change or removal of nonsurgical wound dressing: Secondary | ICD-10-CM | POA: Diagnosis not present

## 2017-07-26 DIAGNOSIS — Z4800 Encounter for change or removal of nonsurgical wound dressing: Secondary | ICD-10-CM | POA: Diagnosis not present

## 2017-07-26 DIAGNOSIS — L97512 Non-pressure chronic ulcer of other part of right foot with fat layer exposed: Secondary | ICD-10-CM | POA: Diagnosis not present

## 2017-07-26 DIAGNOSIS — M50322 Other cervical disc degeneration at C5-C6 level: Secondary | ICD-10-CM | POA: Diagnosis not present

## 2017-07-26 DIAGNOSIS — M9901 Segmental and somatic dysfunction of cervical region: Secondary | ICD-10-CM | POA: Diagnosis not present

## 2017-07-26 DIAGNOSIS — L03116 Cellulitis of left lower limb: Secondary | ICD-10-CM | POA: Diagnosis not present

## 2017-07-26 DIAGNOSIS — E11621 Type 2 diabetes mellitus with foot ulcer: Secondary | ICD-10-CM | POA: Diagnosis not present

## 2017-07-26 DIAGNOSIS — M21611 Bunion of right foot: Secondary | ICD-10-CM | POA: Diagnosis not present

## 2017-07-26 DIAGNOSIS — E1151 Type 2 diabetes mellitus with diabetic peripheral angiopathy without gangrene: Secondary | ICD-10-CM | POA: Diagnosis not present

## 2017-07-27 ENCOUNTER — Other Ambulatory Visit: Payer: Self-pay

## 2017-07-27 ENCOUNTER — Encounter: Payer: Self-pay | Admitting: Family Medicine

## 2017-07-27 ENCOUNTER — Ambulatory Visit (INDEPENDENT_AMBULATORY_CARE_PROVIDER_SITE_OTHER): Payer: Medicare Other | Admitting: Family Medicine

## 2017-07-27 VITALS — BP 130/84 | HR 65 | Temp 98.4°F | Resp 16

## 2017-07-27 DIAGNOSIS — L97513 Non-pressure chronic ulcer of other part of right foot with necrosis of muscle: Secondary | ICD-10-CM | POA: Diagnosis not present

## 2017-07-27 DIAGNOSIS — E1142 Type 2 diabetes mellitus with diabetic polyneuropathy: Secondary | ICD-10-CM

## 2017-07-27 DIAGNOSIS — E118 Type 2 diabetes mellitus with unspecified complications: Secondary | ICD-10-CM | POA: Diagnosis not present

## 2017-07-27 DIAGNOSIS — E11621 Type 2 diabetes mellitus with foot ulcer: Secondary | ICD-10-CM

## 2017-07-27 NOTE — Patient Instructions (Signed)
I will place shoe order  Need follow up in June

## 2017-07-27 NOTE — Progress Notes (Signed)
Chief Complaint  Patient presents with  . Follow-up   Patient is a diabetic is being treated for a diabetic foot ulcer.  She sees a podiatrist every 2 weeks.  She has a wound VAC.  She has home health nurses.  She is nonambulatory at this time in a fracture boot.  She is very tired of not being able to walk.  This is been going on for months.  Her sugars are well controlled.  Her hemoglobin A1c when last checked was 6.2. She is here because her podiatrist is recommended that she get diabetic shoes.  Because of insurance reasons, the podiatrist cannot order these.  She is here specifically for a diabetic foot examination, and documentation to provide diabetic shoes.  Since she is currently being treated for a diabetic foot ulcer, has known diabetes neuropathy, and foot deformities she certainly will qualify for diabetic shoe wear.  Patient Active Problem List   Diagnosis Date Noted  . Abscess of right foot   . Diabetes mellitus type II, non insulin dependent ([place]) 05/03/2017  . Diabetic foot ulcer ([place]) 05/03/2017  . Cellulitis 05/03/2017  . Diabetic foot infection ([place])   . Chronic osteoarthritis 04/14/2017  . Episodic recurrent vertigo 04/14/2017  . Osteoporosis 04/14/2017  . Diabetic nephropathy ([place]) 03/24/2017  . Diabetic neuropathy associated with type 2 diabetes mellitus ([place]) 03/24/2017  . Cataracts, bilateral 03/24/2017  . History of bilateral breast cancer 03/24/2017  . Recurrent UTI 03/24/2017  . HLD (hyperlipidemia) 03/24/2017  . Diverticulosis 03/24/2017    Outpatient Encounter Medications as of 07/27/2017  Medication Sig  . acetaminophen (TYLENOL) 500 MG tablet Take 500 mg by mouth every 6 (six) hours as needed for mild pain or moderate pain.  . Amino Acids-Protein Hydrolys (FEEDING SUPPLEMENT, PRO-STAT SUGAR FREE 64,) LIQD Take 30 mLs by mouth 2 (two) times daily.  [name] gabapentin (NEURONTIN) 400 MG capsule Take 800 mg 4 (four) times daily by mouth.  . metFORMIN  (GLUCOPHAGE) 500 MG tablet Take 500 mg by mouth daily after supper.   . Multiple Vitamin (MULTIVITAMIN) capsule Take 1 capsule daily by mouth.  . NON FORMULARY Take 2 capsules by mouth daily. [facility]  . nutrition supplement, JUVEN, (JUVEN) PACK Take 1 packet by mouth 2 (two) times daily between meals.  . senna-docusate (SENOKOT-S) 8.6-50 MG tablet Take 1 tablet by mouth at bedtime as needed for mild constipation.  . traMADol (ULTRAM) 50 MG tablet Take 2 tablets (100 mg total) by mouth every 12 (twelve) hours as needed for moderate pain.  [name] UNABLE TO FIND Disp 1 transport  wheel chair  Dx   E93.810,  F75.102  . amoxicillin-clavulanate (AUGMENTIN) 875-125 MG tablet   . REGRANEX 0.01 % gel    No facility-administered encounter medications on file as of 07/27/2017.     Allergies  Allergen Reactions  . Phenergan [Promethazine Hcl] Other (See Comments)    Made delerious    Review of Systems  Constitutional: Negative for activity change, appetite change and unexpected weight change.       Weight is stable  HENT: Negative for congestion, dental problem, postnasal drip and rhinorrhea.   Eyes: Positive for visual disturbance. Negative for redness.       Known cataracts  Respiratory: Negative for cough and shortness of breath.   Cardiovascular: Positive for leg swelling. Negative for chest pain and palpitations.       Mild edema reported, none since she has been off of her feet  Gastrointestinal:  Negative for abdominal pain, constipation and diarrhea.  Genitourinary: Negative for difficulty urinating and frequency.  Musculoskeletal: Positive for gait problem. Negative for arthralgias and back pain.       In boot left foot and ankle  Skin: Positive for wound.       Diabetic ulcer left great toe  Neurological: Positive for numbness. Negative for dizziness and headaches.       Numbness and neuropathic pain predominantly in lower legs  Psychiatric/Behavioral: Negative for dysphoric mood and  sleep disturbance. The patient is not nervous/anxious.     BP 130/84 (BP Location: Left Arm, Patient Position: Sitting, Cuff Size: Normal)   Pulse 65   Temp 98.4 F (36.9 C) (Temporal)   Resp 16   SpO2 99%   Physical Exam  Constitutional: She is oriented to person, place, and time. She appears well-developed and well-nourished. No distress.  Good spirits, smiling.  Pleasant elderly woman who appears stated age.   HENT:  Head: Normocephalic and atraumatic.  Mouth/Throat: Oropharynx is clear and moist.  Eyes: Conjunctivae are normal. Pupils are equal, round, and reactive to light.  Neck: Normal range of motion.  Cardiovascular: Normal rate, regular rhythm and normal heart sounds.  Pulses:      Dorsalis pedis pulses are 1+ on the right side, and 1+ on the left side.       Posterior tibial pulses are 1+ on the right side, and 1+ on the left side.  No ectopy  Pulmonary/Chest: Effort normal and breath sounds normal. She has no wheezes.  Musculoskeletal:       Right foot: There is deformity.       Left foot: There is decreased range of motion and deformity.  dressing not removed left  Feet:  Right Foot:  Protective Sensation: 6 sites tested. 0 sites sensed.  Skin Integrity: Positive for ulcer, skin breakdown and dry skin.  Left Foot:  Protective Sensation: 6 sites tested. 0 sites sensed.  Skin Integrity: Positive for erythema and dry skin. Negative for ulcer, blister or skin breakdown.  Lymphadenopathy:    She has no cervical adenopathy.  Neurological: She is alert and oriented to person, place, and time.  Psychiatric: She has a normal mood and affect. Her behavior is normal.  The boot is removed.  The right foot has a wound VAC in place.  This is not disturbed.  There is some erythema of the great toe.  It is mild, does not appear to be a cellulitis.  There is no cellulitis swelling or tenderness proximal to the vac.  No adenopathy.  The foot has dry skin, skin is intact.  All of the  toes have some flexion, clawing.  There is erythema on the volar surface of the great toe from pressure on the shoe.  Padding is recommended.  ASSESSMENT/PLAN:   Diabetes, diabetes neuropathy, diabetes foot ulcer  Patient Instructions  I will place shoe order  Need follow up in June   Raylene Everts, MD

## 2017-07-29 DIAGNOSIS — M9901 Segmental and somatic dysfunction of cervical region: Secondary | ICD-10-CM | POA: Diagnosis not present

## 2017-07-29 DIAGNOSIS — L97512 Non-pressure chronic ulcer of other part of right foot with fat layer exposed: Secondary | ICD-10-CM | POA: Diagnosis not present

## 2017-07-29 DIAGNOSIS — M50322 Other cervical disc degeneration at C5-C6 level: Secondary | ICD-10-CM | POA: Diagnosis not present

## 2017-07-29 DIAGNOSIS — E1351 Other specified diabetes mellitus with diabetic peripheral angiopathy without gangrene: Secondary | ICD-10-CM | POA: Diagnosis not present

## 2017-07-31 DIAGNOSIS — L97512 Non-pressure chronic ulcer of other part of right foot with fat layer exposed: Secondary | ICD-10-CM | POA: Diagnosis not present

## 2017-07-31 DIAGNOSIS — E1151 Type 2 diabetes mellitus with diabetic peripheral angiopathy without gangrene: Secondary | ICD-10-CM | POA: Diagnosis not present

## 2017-07-31 DIAGNOSIS — E11621 Type 2 diabetes mellitus with foot ulcer: Secondary | ICD-10-CM | POA: Diagnosis not present

## 2017-07-31 DIAGNOSIS — L03116 Cellulitis of left lower limb: Secondary | ICD-10-CM | POA: Diagnosis not present

## 2017-07-31 DIAGNOSIS — M21611 Bunion of right foot: Secondary | ICD-10-CM | POA: Diagnosis not present

## 2017-07-31 DIAGNOSIS — Z4800 Encounter for change or removal of nonsurgical wound dressing: Secondary | ICD-10-CM | POA: Diagnosis not present

## 2017-08-02 DIAGNOSIS — E11621 Type 2 diabetes mellitus with foot ulcer: Secondary | ICD-10-CM | POA: Diagnosis not present

## 2017-08-02 DIAGNOSIS — L03116 Cellulitis of left lower limb: Secondary | ICD-10-CM | POA: Diagnosis not present

## 2017-08-02 DIAGNOSIS — L97512 Non-pressure chronic ulcer of other part of right foot with fat layer exposed: Secondary | ICD-10-CM | POA: Diagnosis not present

## 2017-08-02 DIAGNOSIS — M21611 Bunion of right foot: Secondary | ICD-10-CM | POA: Diagnosis not present

## 2017-08-02 DIAGNOSIS — E1151 Type 2 diabetes mellitus with diabetic peripheral angiopathy without gangrene: Secondary | ICD-10-CM | POA: Diagnosis not present

## 2017-08-02 DIAGNOSIS — Z4800 Encounter for change or removal of nonsurgical wound dressing: Secondary | ICD-10-CM | POA: Diagnosis not present

## 2017-08-03 ENCOUNTER — Ambulatory Visit: Payer: Medicare Other | Admitting: Family Medicine

## 2017-08-04 DIAGNOSIS — L97512 Non-pressure chronic ulcer of other part of right foot with fat layer exposed: Secondary | ICD-10-CM | POA: Diagnosis not present

## 2017-08-05 DIAGNOSIS — M50322 Other cervical disc degeneration at C5-C6 level: Secondary | ICD-10-CM | POA: Diagnosis not present

## 2017-08-05 DIAGNOSIS — M9901 Segmental and somatic dysfunction of cervical region: Secondary | ICD-10-CM | POA: Diagnosis not present

## 2017-08-06 DIAGNOSIS — Z4800 Encounter for change or removal of nonsurgical wound dressing: Secondary | ICD-10-CM | POA: Diagnosis not present

## 2017-08-06 DIAGNOSIS — L03116 Cellulitis of left lower limb: Secondary | ICD-10-CM | POA: Diagnosis not present

## 2017-08-06 DIAGNOSIS — E11621 Type 2 diabetes mellitus with foot ulcer: Secondary | ICD-10-CM | POA: Diagnosis not present

## 2017-08-06 DIAGNOSIS — E1151 Type 2 diabetes mellitus with diabetic peripheral angiopathy without gangrene: Secondary | ICD-10-CM | POA: Diagnosis not present

## 2017-08-06 DIAGNOSIS — M21611 Bunion of right foot: Secondary | ICD-10-CM | POA: Diagnosis not present

## 2017-08-06 DIAGNOSIS — L97512 Non-pressure chronic ulcer of other part of right foot with fat layer exposed: Secondary | ICD-10-CM | POA: Diagnosis not present

## 2017-08-09 DIAGNOSIS — H40033 Anatomical narrow angle, bilateral: Secondary | ICD-10-CM | POA: Diagnosis not present

## 2017-08-09 DIAGNOSIS — M21611 Bunion of right foot: Secondary | ICD-10-CM | POA: Diagnosis not present

## 2017-08-09 DIAGNOSIS — L03116 Cellulitis of left lower limb: Secondary | ICD-10-CM | POA: Diagnosis not present

## 2017-08-09 DIAGNOSIS — H2513 Age-related nuclear cataract, bilateral: Secondary | ICD-10-CM | POA: Diagnosis not present

## 2017-08-09 DIAGNOSIS — E11621 Type 2 diabetes mellitus with foot ulcer: Secondary | ICD-10-CM | POA: Diagnosis not present

## 2017-08-09 DIAGNOSIS — Z4800 Encounter for change or removal of nonsurgical wound dressing: Secondary | ICD-10-CM | POA: Diagnosis not present

## 2017-08-09 DIAGNOSIS — E1151 Type 2 diabetes mellitus with diabetic peripheral angiopathy without gangrene: Secondary | ICD-10-CM | POA: Diagnosis not present

## 2017-08-09 DIAGNOSIS — L97512 Non-pressure chronic ulcer of other part of right foot with fat layer exposed: Secondary | ICD-10-CM | POA: Diagnosis not present

## 2017-08-10 DIAGNOSIS — M9901 Segmental and somatic dysfunction of cervical region: Secondary | ICD-10-CM | POA: Diagnosis not present

## 2017-08-10 DIAGNOSIS — M50322 Other cervical disc degeneration at C5-C6 level: Secondary | ICD-10-CM | POA: Diagnosis not present

## 2017-08-12 DIAGNOSIS — L97513 Non-pressure chronic ulcer of other part of right foot with necrosis of muscle: Secondary | ICD-10-CM | POA: Diagnosis not present

## 2017-08-12 DIAGNOSIS — E11621 Type 2 diabetes mellitus with foot ulcer: Secondary | ICD-10-CM | POA: Diagnosis not present

## 2017-08-13 DIAGNOSIS — E11621 Type 2 diabetes mellitus with foot ulcer: Secondary | ICD-10-CM | POA: Diagnosis not present

## 2017-08-13 DIAGNOSIS — E1151 Type 2 diabetes mellitus with diabetic peripheral angiopathy without gangrene: Secondary | ICD-10-CM | POA: Diagnosis not present

## 2017-08-13 DIAGNOSIS — L97512 Non-pressure chronic ulcer of other part of right foot with fat layer exposed: Secondary | ICD-10-CM | POA: Diagnosis not present

## 2017-08-13 DIAGNOSIS — M21611 Bunion of right foot: Secondary | ICD-10-CM | POA: Diagnosis not present

## 2017-08-13 DIAGNOSIS — Z4800 Encounter for change or removal of nonsurgical wound dressing: Secondary | ICD-10-CM | POA: Diagnosis not present

## 2017-08-13 DIAGNOSIS — L03116 Cellulitis of left lower limb: Secondary | ICD-10-CM | POA: Diagnosis not present

## 2017-08-16 ENCOUNTER — Other Ambulatory Visit: Payer: Self-pay

## 2017-08-16 ENCOUNTER — Telehealth: Payer: Self-pay | Admitting: Family Medicine

## 2017-08-16 DIAGNOSIS — L97512 Non-pressure chronic ulcer of other part of right foot with fat layer exposed: Secondary | ICD-10-CM | POA: Diagnosis not present

## 2017-08-16 DIAGNOSIS — E1151 Type 2 diabetes mellitus with diabetic peripheral angiopathy without gangrene: Secondary | ICD-10-CM | POA: Diagnosis not present

## 2017-08-16 MED ORDER — METFORMIN HCL 500 MG PO TABS: 500.0000 mg | ORAL_TABLET | Freq: Every day | ORAL | 0 refills | Status: DC

## 2017-08-16 MED ORDER — UNABLE TO FIND: 0 refills | Status: DC

## 2017-08-16 NOTE — Telephone Encounter (Signed)
metFORMIN (GLUCOPHAGE) 500 MG tablet - please call in refill and she also wants to check on her diabetic shoes.

## 2017-08-16 NOTE — Telephone Encounter (Signed)
DONE AND PT TO COME COLLECT [facility]

## 2017-08-18 DIAGNOSIS — L97512 Non-pressure chronic ulcer of other part of right foot with fat layer exposed: Secondary | ICD-10-CM | POA: Diagnosis not present

## 2017-08-18 DIAGNOSIS — E11621 Type 2 diabetes mellitus with foot ulcer: Secondary | ICD-10-CM | POA: Diagnosis not present

## 2017-08-18 DIAGNOSIS — M21611 Bunion of right foot: Secondary | ICD-10-CM | POA: Diagnosis not present

## 2017-08-18 DIAGNOSIS — L03116 Cellulitis of left lower limb: Secondary | ICD-10-CM | POA: Diagnosis not present

## 2017-08-18 DIAGNOSIS — E1151 Type 2 diabetes mellitus with diabetic peripheral angiopathy without gangrene: Secondary | ICD-10-CM | POA: Diagnosis not present

## 2017-08-18 DIAGNOSIS — Z4800 Encounter for change or removal of nonsurgical wound dressing: Secondary | ICD-10-CM | POA: Diagnosis not present

## 2017-08-18 DIAGNOSIS — Z7984 Long term (current) use of oral hypoglycemic drugs: Secondary | ICD-10-CM | POA: Diagnosis not present

## 2017-08-19 DIAGNOSIS — L97512 Non-pressure chronic ulcer of other part of right foot with fat layer exposed: Secondary | ICD-10-CM | POA: Diagnosis not present

## 2017-08-19 DIAGNOSIS — E1351 Other specified diabetes mellitus with diabetic peripheral angiopathy without gangrene: Secondary | ICD-10-CM | POA: Diagnosis not present

## 2017-08-19 DIAGNOSIS — M79672 Pain in left foot: Secondary | ICD-10-CM | POA: Diagnosis not present

## 2017-08-19 DIAGNOSIS — M21611 Bunion of right foot: Secondary | ICD-10-CM | POA: Diagnosis not present

## 2017-08-20 DIAGNOSIS — E11621 Type 2 diabetes mellitus with foot ulcer: Secondary | ICD-10-CM | POA: Diagnosis not present

## 2017-08-20 DIAGNOSIS — E1151 Type 2 diabetes mellitus with diabetic peripheral angiopathy without gangrene: Secondary | ICD-10-CM | POA: Diagnosis not present

## 2017-08-20 DIAGNOSIS — M21611 Bunion of right foot: Secondary | ICD-10-CM | POA: Diagnosis not present

## 2017-08-20 DIAGNOSIS — L97512 Non-pressure chronic ulcer of other part of right foot with fat layer exposed: Secondary | ICD-10-CM | POA: Diagnosis not present

## 2017-08-20 DIAGNOSIS — L03116 Cellulitis of left lower limb: Secondary | ICD-10-CM | POA: Diagnosis not present

## 2017-08-20 DIAGNOSIS — Z4800 Encounter for change or removal of nonsurgical wound dressing: Secondary | ICD-10-CM | POA: Diagnosis not present

## 2017-08-23 DIAGNOSIS — L97512 Non-pressure chronic ulcer of other part of right foot with fat layer exposed: Secondary | ICD-10-CM | POA: Diagnosis not present

## 2017-08-23 DIAGNOSIS — E1151 Type 2 diabetes mellitus with diabetic peripheral angiopathy without gangrene: Secondary | ICD-10-CM | POA: Diagnosis not present

## 2017-08-23 DIAGNOSIS — M21611 Bunion of right foot: Secondary | ICD-10-CM | POA: Diagnosis not present

## 2017-08-23 DIAGNOSIS — E11621 Type 2 diabetes mellitus with foot ulcer: Secondary | ICD-10-CM | POA: Diagnosis not present

## 2017-08-23 DIAGNOSIS — Z4800 Encounter for change or removal of nonsurgical wound dressing: Secondary | ICD-10-CM | POA: Diagnosis not present

## 2017-08-23 DIAGNOSIS — L03116 Cellulitis of left lower limb: Secondary | ICD-10-CM | POA: Diagnosis not present

## 2017-08-23 NOTE — Patient Instructions (Signed)
Your procedure is scheduled on: 08/30/2017  Report to [facility] at  700   AM.  Call this number if you have problems the morning of surgery: [phone]   Do not eat food or drink liquids :After Midnight.      Take these medicines the morning of surgery with A SIP OF WATER: gabapentin.   Do not wear jewelry, make-up or nail polish.  Do not wear lotions, powders, or perfumes. You may wear deodorant.  Do not shave 48 hours prior to surgery.  Do not bring valuables to the hospital.  Contacts, dentures or bridgework may not be worn into surgery.  Leave suitcase in the car. After surgery it may be brought to your room.  For patients admitted to the hospital, checkout time is 11:00 AM the day of discharge.   Patients discharged the day of surgery will not be allowed to drive home.  :     Please read over the following fact sheets that you were given: Coughing and Deep Breathing, Surgical Site Infection Prevention, Anesthesia Post-op Instructions and Care and Recovery After Surgery    Cataract A cataract is a clouding of the lens of the eye. When a lens becomes cloudy, vision is reduced based on the degree and nature of the clouding. Many cataracts reduce vision to some degree. Some cataracts make people more near-sighted as they develop. Other cataracts increase glare. Cataracts that are ignored and become worse can sometimes look white. The white color can be seen through the pupil. CAUSES   Aging. However, cataracts may occur at any age, even in newborns.   Certain drugs.   Trauma to the eye.   Certain diseases such as diabetes.   Specific eye diseases such as chronic inflammation inside the eye or a sudden attack of a rare form of glaucoma.   Inherited or acquired medical problems.  SYMPTOMS   Gradual, progressive drop in vision in the affected eye.   Severe, rapid visual loss. This most often happens when trauma is the cause.  DIAGNOSIS  To detect a cataract, an eye doctor  examines the lens. Cataracts are best diagnosed with an exam of the eyes with the pupils enlarged (dilated) by drops.  TREATMENT  For an early cataract, vision may improve by using different eyeglasses or stronger lighting. If that does not help your vision, surgery is the only effective treatment. A cataract needs to be surgically removed when vision loss interferes with your everyday activities, such as driving, reading, or watching TV. A cataract may also have to be removed if it prevents examination or treatment of another eye problem. Surgery removes the cloudy lens and usually replaces it with a substitute lens (intraocular lens, IOL).  At a time when both you and your doctor agree, the cataract will be surgically removed. If you have cataracts in both eyes, only one is usually removed at a time. This allows the operated eye to heal and be out of danger from any possible problems after surgery (such as infection or poor wound healing). In rare cases, a cataract may be doing damage to your eye. In these cases, your caregiver may advise surgical removal right away. The vast majority of people who have cataract surgery have better vision afterward. HOME CARE INSTRUCTIONS  If you are not planning surgery, you may be asked to do the following:  Use different eyeglasses.   Use stronger or brighter lighting.   Ask your eye doctor about reducing your medicine dose  or changing medicines if it is thought that a medicine caused your cataract. Changing medicines does not make the cataract go away on its own.   Become familiar with your surroundings. Poor vision can lead to injury. Avoid bumping into things on the affected side. You are at a higher risk for tripping or falling.   Exercise extreme care when driving or operating machinery.   Wear sunglasses if you are sensitive to bright light or experiencing problems with glare.  SEEK IMMEDIATE MEDICAL CARE IF:   You have a worsening or sudden vision  loss.   You notice redness, swelling, or increasing pain in the eye.   You have a fever.  Document Released: 04/27/2005 Document Revised: 04/16/2011 Document Reviewed: 12/19/2010 [facility] Patient Information 2012 [place].PATIENT INSTRUCTIONS POST-ANESTHESIA  IMMEDIATELY FOLLOWING SURGERY:  Do not drive or operate machinery for the first twenty four hours after surgery.  Do not make any important decisions for twenty four hours after surgery or while taking narcotic pain medications or sedatives.  If you develop intractable nausea and vomiting or a severe headache please notify your doctor immediately.  FOLLOW-UP:  Please make an appointment with your surgeon as instructed. You do not need to follow up with anesthesia unless specifically instructed to do so.  WOUND CARE INSTRUCTIONS (if applicable):  Keep a dry clean dressing on the anesthesia/puncture wound site if there is drainage.  Once the wound has quit draining you may leave it open to air.  Generally you should leave the bandage intact for twenty four hours unless there is drainage.  If the epidural site drains for more than 36-48 hours please call the anesthesia department.  QUESTIONS?:  Please feel free to call your physician or the hospital operator if you have any questions, and they will be happy to assist you.

## 2017-08-24 DIAGNOSIS — H2511 Age-related nuclear cataract, right eye: Secondary | ICD-10-CM | POA: Diagnosis not present

## 2017-08-25 ENCOUNTER — Encounter (HOSPITAL_COMMUNITY)
Admission: RE | Admit: 2017-08-25 | Discharge: 2017-08-25 | Disposition: A | Discharge status: Home / Self Care | Payer: Medicare Other | Source: Ambulatory Visit | Attending: Ophthalmology | Admitting: Ophthalmology

## 2017-08-25 ENCOUNTER — Other Ambulatory Visit: Payer: Self-pay

## 2017-08-25 ENCOUNTER — Encounter (HOSPITAL_COMMUNITY): Payer: Self-pay

## 2017-08-25 DIAGNOSIS — Z01812 Encounter for preprocedural laboratory examination: Secondary | ICD-10-CM | POA: Diagnosis not present

## 2017-08-25 HISTORY — DX: Polyneuropathy, unspecified: G62.9

## 2017-08-25 LAB — CBC
HCT: 41.5 % (ref 36.0–46.0)
Hemoglobin: 13.1 g/dL (ref 12.0–15.0)
MCH: 27.5 pg (ref 26.0–34.0)
MCHC: 31.6 g/dL (ref 30.0–36.0)
MCV: 87 fL (ref 78.0–100.0)
PLATELETS: 208 10*3/uL (ref 150–400)
RBC: 4.77 MIL/uL (ref 3.87–5.11)
RDW: 14.7 % (ref 11.5–15.5)
WBC: 5.5 10*3/uL (ref 4.0–10.5)

## 2017-08-25 LAB — GLUCOSE, CAPILLARY: GLUCOSE-CAPILLARY: 79 mg/dL (ref 65–99)

## 2017-08-25 LAB — BASIC METABOLIC PANEL
ANION GAP: 12 (ref 5–15)
BUN: 23 mg/dL — ABNORMAL HIGH (ref 6–20)
CO2: 22 mmol/L (ref 22–32)
CREATININE: 0.69 mg/dL (ref 0.44–1.00)
Calcium: 9.6 mg/dL (ref 8.9–10.3)
Chloride: 104 mmol/L (ref 101–111)
GLUCOSE: 78 mg/dL (ref 65–99)
Potassium: 4 mmol/L (ref 3.5–5.1)
Sodium: 138 mmol/L (ref 135–145)

## 2017-08-25 LAB — HEMOGLOBIN A1C
Hgb A1c MFr Bld: 6.1 % — ABNORMAL HIGH (ref 4.8–5.6)
Mean Plasma Glucose: 128.37 mg/dL

## 2017-08-26 DIAGNOSIS — L97512 Non-pressure chronic ulcer of other part of right foot with fat layer exposed: Secondary | ICD-10-CM | POA: Diagnosis not present

## 2017-08-26 DIAGNOSIS — E11621 Type 2 diabetes mellitus with foot ulcer: Secondary | ICD-10-CM | POA: Diagnosis not present

## 2017-08-26 NOTE — Pre-Procedure Instructions (Signed)
HgbA1C routed to PCP. 

## 2017-08-30 ENCOUNTER — Ambulatory Visit (HOSPITAL_COMMUNITY)
Admission: RE | Admit: 2017-08-30 | Discharge: 2017-08-30 | Disposition: A | Discharge status: Home / Self Care | Payer: Medicare Other | Source: Ambulatory Visit | Attending: Ophthalmology | Admitting: Ophthalmology

## 2017-08-30 ENCOUNTER — Encounter (HOSPITAL_COMMUNITY)
Admission: RE | Disposition: A | Discharge status: Home / Self Care | Payer: Self-pay | Source: Ambulatory Visit | Attending: Ophthalmology

## 2017-08-30 ENCOUNTER — Encounter (HOSPITAL_COMMUNITY): Payer: Self-pay | Admitting: Anesthesiology

## 2017-08-30 ENCOUNTER — Ambulatory Visit (HOSPITAL_COMMUNITY): Payer: Medicare Other | Admitting: Anesthesiology

## 2017-08-30 DIAGNOSIS — Z87891 Personal history of nicotine dependence: Secondary | ICD-10-CM | POA: Insufficient documentation

## 2017-08-30 DIAGNOSIS — E119 Type 2 diabetes mellitus without complications: Secondary | ICD-10-CM | POA: Diagnosis not present

## 2017-08-30 DIAGNOSIS — Z79899 Other long term (current) drug therapy: Secondary | ICD-10-CM | POA: Diagnosis not present

## 2017-08-30 DIAGNOSIS — Z853 Personal history of malignant neoplasm of breast: Secondary | ICD-10-CM | POA: Diagnosis not present

## 2017-08-30 DIAGNOSIS — H2511 Age-related nuclear cataract, right eye: Secondary | ICD-10-CM | POA: Diagnosis not present

## 2017-08-30 DIAGNOSIS — E1136 Type 2 diabetes mellitus with diabetic cataract: Secondary | ICD-10-CM | POA: Diagnosis not present

## 2017-08-30 DIAGNOSIS — Z7984 Long term (current) use of oral hypoglycemic drugs: Secondary | ICD-10-CM | POA: Diagnosis not present

## 2017-08-30 HISTORY — PX: CATARACT EXTRACTION W/PHACO: SHX586

## 2017-08-30 LAB — GLUCOSE, CAPILLARY: Glucose-Capillary: 92 mg/dL (ref 65–99)

## 2017-08-30 SURGERY — PHACOEMULSIFICATION, CATARACT, WITH IOL INSERTION
Anesthesia: Monitor Anesthesia Care | Site: Eye | Laterality: Right

## 2017-08-30 MED ORDER — BSS IO SOLN
INTRAOCULAR | Status: DC | PRN
  Administered 2017-08-30: 15 mL via INTRAOCULAR

## 2017-08-30 MED ORDER — NA HYALUR & NA CHOND-NA HYALUR 0.55-0.5 ML IO KIT
PACK | INTRAOCULAR | Status: DC | PRN
  Administered 2017-08-30: 1 via OPHTHALMIC

## 2017-08-30 MED ORDER — MIDAZOLAM HCL 5 MG/5ML IJ SOLN
INTRAMUSCULAR | Status: DC | PRN
  Administered 2017-08-30: 2 mg via INTRAVENOUS

## 2017-08-30 MED ORDER — LACTATED RINGERS IV SOLN
INTRAVENOUS | Status: DC
  Administered 2017-08-30: 08:00:00 via INTRAVENOUS

## 2017-08-30 MED ORDER — EPINEPHRINE PF 1 MG/ML IJ SOLN
INTRAOCULAR | Status: DC | PRN
  Administered 2017-08-30: 500 mL

## 2017-08-30 MED ORDER — LIDOCAINE HCL 3.5 % OP GEL
1.0000 "application " | Freq: Once | OPHTHALMIC | Status: AC
  Administered 2017-08-30: 1 via OPHTHALMIC

## 2017-08-30 MED ORDER — LIDOCAINE HCL (PF) 1 % IJ SOLN
INTRAMUSCULAR | Status: DC | PRN
  Administered 2017-08-30: .3 mL

## 2017-08-30 MED ORDER — NEOMYCIN-POLYMYXIN-DEXAMETH 3.5-10000-0.1 OP SUSP
OPHTHALMIC | Status: DC | PRN
  Administered 2017-08-30: 2 [drp] via OPHTHALMIC

## 2017-08-30 MED ORDER — MIDAZOLAM HCL 2 MG/2ML IJ SOLN
INTRAMUSCULAR | Status: AC
  Filled 2017-08-30: qty 2

## 2017-08-30 MED ORDER — TETRACAINE HCL 0.5 % OP SOLN
1.0000 [drp] | OPHTHALMIC | Status: AC
  Administered 2017-08-30 (×3): 1 [drp] via OPHTHALMIC

## 2017-08-30 MED ORDER — PHENYLEPHRINE HCL 2.5 % OP SOLN
1.0000 [drp] | OPHTHALMIC | Status: AC
  Administered 2017-08-30 (×3): 1 [drp] via OPHTHALMIC

## 2017-08-30 MED ORDER — CYCLOPENTOLATE-PHENYLEPHRINE 0.2-1 % OP SOLN
1.0000 [drp] | OPHTHALMIC | Status: AC
  Administered 2017-08-30 (×3): 1 [drp] via OPHTHALMIC

## 2017-08-30 MED ORDER — POVIDONE-IODINE 5 % OP SOLN
OPHTHALMIC | Status: DC | PRN
  Administered 2017-08-30: 1 via OPHTHALMIC

## 2017-08-30 SURGICAL SUPPLY — 10 items
CLOTH BEACON ORANGE TIMEOUT ST (SAFETY) ×1 IMPLANT
EYE SHIELD UNIVERSAL CLEAR (GAUZE/BANDAGES/DRESSINGS) ×1 IMPLANT
GLOVE BIOGEL PI IND STRL 7.0 (GLOVE) IMPLANT
GLOVE BIOGEL PI INDICATOR 7.0 (GLOVE) ×2
PAD ARMBOARD 7.5X6 YLW CONV (MISCELLANEOUS) ×1 IMPLANT
SIGHTPATH CAT PROC W REG LENS (Ophthalmic Related) ×2 IMPLANT
SYRINGE LUER LOK 1CC (MISCELLANEOUS) ×1 IMPLANT
TAPE SURG TRANSPORE 1 IN (GAUZE/BANDAGES/DRESSINGS) IMPLANT
TAPE SURGICAL TRANSPORE 1 IN (GAUZE/BANDAGES/DRESSINGS) ×1
WATER STERILE IRR 250ML POUR (IV SOLUTION) ×1 IMPLANT

## 2017-08-30 NOTE — Anesthesia Preprocedure Evaluation (Signed)
Anesthesia Evaluation  Patient identified by MRN, date of birth, ID band Patient awake    Reviewed: Allergy & Precautions, H&P , NPO status , Patient's Chart, lab work & pertinent test results, reviewed documented beta blocker date and time   Airway Mallampati: II  TM Distance: >3 FB Neck ROM: full    Dental no notable dental hx.    Pulmonary neg pulmonary ROS, former smoker,    Pulmonary exam normal breath sounds clear to auscultation       Cardiovascular Exercise Tolerance: Good negative cardio ROS   Rhythm:regular Rate:Normal     Neuro/Psych negative neurological ROS  negative psych ROS   GI/Hepatic negative GI ROS, Neg liver ROS,   Endo/Other  negative endocrine ROSdiabetes  Renal/GU Renal diseasenegative Renal ROS  negative genitourinary   Musculoskeletal   Abdominal   Peds  Hematology negative hematology ROS (+)   Anesthesia Other Findings History of bilateral breast cancer  Reproductive/Obstetrics negative OB ROS                             Anesthesia Physical Anesthesia Plan  ASA: III  Anesthesia Plan: MAC   Post-op Pain Management:    Induction:   PONV Risk Score and Plan:   Airway Management Planned:   Additional Equipment:   Intra-op Plan:   Post-operative Plan:   Informed Consent: I have reviewed the patients History and Physical, chart, labs and discussed the procedure including the risks, benefits and alternatives for the proposed anesthesia with the patient or authorized representative who has indicated his/her understanding and acceptance.   Dental Advisory Given  Plan Discussed with: CRNA  Anesthesia Plan Comments:         Anesthesia Quick Evaluation

## 2017-08-30 NOTE — Op Note (Signed)
Date of Admission: 08/30/2017  Date of Surgery: 08/30/2017  Pre-Op Dx: Cataract Right  Eye  Post-Op Dx: Senile Nuclear Cataract  Right  Eye,  Dx Code H25.11  Surgeon: [name], M.D.  Assistants: None  Anesthesia: Topical with MAC  Indications: Painless, progressive loss of vision with compromise of daily activities.  Surgery: Cataract Extraction with Intraocular lens Implant Right Eye  Discription: The patient had dilating drops and viscous lidocaine placed into the Right eye in the pre-op holding area. After transfer to the operating room, a time out was performed. The patient was then prepped and draped. Beginning with a 85m blade a paracentesis port was made at the surgeon's 2 o'clock position. The anterior chamber was then filled with 1% non-preserved lidocaine. This was followed by filling the anterior chamber with Provisc.  A 2.441mkeratome blade was used to make a clear corneal incision at the temporal limbus.  A bent cystatome needle was used to create a continuous tear capsulotomy. Hydrodissection was performed with balanced salt solution on a Fine canula. The lens nucleus was then removed using the phacoemulsification handpiece. Residual cortex was removed with the I&A handpiece. The anterior chamber and capsular bag were refilled with Provisc. A posterior chamber intraocular lens was placed into the capsular bag with it's injector. The implant was positioned with the Kuglan hook. The Provisc was then removed from the anterior chamber and capsular bag with the I&A handpiece. Stromal hydration of the main incision and paracentesis port was performed with BSS on a Fine canula. The wounds were tested for leak which was negative. The patient tolerated the procedure well. There were no operative complications. The patient was then transferred to the recovery room in stable condition.  Complications: None  Specimen: None  EBL: None  Prosthetic device: J&J Technis, PCB00, power 22.0, SN  [id number]

## 2017-08-30 NOTE — Anesthesia Postprocedure Evaluation (Signed)
Anesthesia Post Note  Patient: Jessica Russell  Procedure(s) Performed: CATARACT EXTRACTION PHACO AND INTRAOCULAR LENS PLACEMENT RIGHT EYE (Right Eye)  Patient location during evaluation: Short Stay Anesthesia Type: MAC Level of consciousness: awake and alert and oriented Pain management: pain level controlled Vital Signs Assessment: post-procedure vital signs reviewed and stable Respiratory status: spontaneous breathing Cardiovascular status: blood pressure returned to baseline Postop Assessment: no apparent nausea or vomiting Anesthetic complications: no     Last Vitals:  Vitals:   08/30/17 0815 08/30/17 0820  BP: (!) 150/77 (!) 141/73  Pulse:    Resp: (!) 27 18  Temp:    SpO2:  97%    Last Pain:  Vitals:   08/30/17 0802  TempSrc: Oral  PainSc: 0-No pain                 Ladaija Dimino

## 2017-08-30 NOTE — H&P (Signed)
I have reviewed the H&P, the patient was re-examined, and I have identified no interval changes in medical condition and plan of care since the history and physical of record  

## 2017-08-30 NOTE — Transfer of Care (Signed)
Immediate Anesthesia Transfer of Care Note  Patient: Jessica Russell  Procedure(s) Performed: CATARACT EXTRACTION PHACO AND INTRAOCULAR LENS PLACEMENT RIGHT EYE (Right Eye)  Patient Location: Short Stay  Anesthesia Type:MAC  Level of Consciousness: awake and alert   Airway & Oxygen Therapy: Patient Spontanous Breathing  Post-op Assessment: Report given to RN  Post vital signs: Reviewed  Last Vitals:  Vitals Value Taken Time  BP    Temp    Pulse    Resp    SpO2      Last Pain:  Vitals:   08/30/17 0802  TempSrc: Oral  PainSc: 0-No pain      Patients Stated Pain Goal: 7 (32/91/91 6606)  Complications: No apparent anesthesia complications

## 2017-08-30 NOTE — Discharge Instructions (Signed)

## 2017-08-31 ENCOUNTER — Encounter (HOSPITAL_COMMUNITY): Payer: Self-pay | Admitting: Ophthalmology

## 2017-09-02 DIAGNOSIS — E1151 Type 2 diabetes mellitus with diabetic peripheral angiopathy without gangrene: Secondary | ICD-10-CM | POA: Diagnosis not present

## 2017-09-02 DIAGNOSIS — L97512 Non-pressure chronic ulcer of other part of right foot with fat layer exposed: Secondary | ICD-10-CM | POA: Diagnosis not present

## 2017-09-06 DIAGNOSIS — H2512 Age-related nuclear cataract, left eye: Secondary | ICD-10-CM | POA: Diagnosis not present

## 2017-09-07 ENCOUNTER — Telehealth: Payer: Self-pay | Admitting: Family Medicine

## 2017-09-07 NOTE — Telephone Encounter (Signed)
Pts husband Jessica Russell came in and was Furious that we had not called him or his wife back, and they had left several messages. I apologized to him, and advised that I would do what I could to help him going forward.  I took the information from him- Per [facility] [phone] Jessica Russell states that [employer] has called several times, and requested Information, and we are not returning calls to them. I attempted to call  [facility] and they were closed from 1-2. I assured Jessica Russell that I would call them, and call him back to advise.   Called [facility] talked to [name] who stated that he would fax a form to me And that I should have it in 30 mins..  45 mins went by, I called [facility] again, and talked to [name], who stated that  The Dr had just made notes on the file, and that the form could not be faxed as it was Not generated yet. I asked him if he had everything from [facility] at this point that he needed. [facility], they had what they needed at this point.  [name] Stated that he would call the [facility] and advised. I also told I was calling the [facility] to follow up.   Called [name], and advised that [facility] had faxed over everything that [facility] needed at this point. I also advised that [employer] had just completed some notes, and that once the form was generated that  They would send it over to [place].  [name], stated that she was going to call [facility], as that is not what they advised her.

## 2017-09-07 NOTE — Telephone Encounter (Signed)
Patient left message on nurse line, very upset. She states this is the 4th message she has left and no one is calling her back- she states it is important that we call her back. I have tried twice to call patient. The first time, the phone rang one time and went to a busy signal, the second time, all I got was the busy signal. I will continue trying to reach patient.

## 2017-09-08 NOTE — Telephone Encounter (Signed)
Pts husband Jessica Russell came in and was Furious that we had not called him or his wife back, and they had left several messages. I apologized to him, and advised that I would do what I could to help him going forward.  I took the information from him- Per [facility] [phone] Jessica Russell states that [employer] has called several times, and requested Information, and we are not returning calls to them. I attempted to call  [facility] and they were closed from 1-2. I assured Jessica Russell that I would call them, and call him back to advise.   Called [facility] talked to [name] who stated that he would fax a form to me And that I should have it in 30 mins..  45 mins went by, I called [facility] again, and talked to [name], who stated that  The Dr had just made notes on the file, and that the form could not be faxed as it was Not generated yet. I asked him if he had everything from [facility] at this point that he needed. [place], they had what they needed at this point.  [name] Stated that he would call the [facility] and advised. I also told I was calling the [facility] to follow up.   Called [name], and advised that [facility] had faxed over everything that [facility] needed at this point. I also advised that [employer] had just completed some notes, and that once the form was generated that  They would send it over to [place].  [name], stated that she was going to call [facility], as that is not what they advised her.

## 2017-09-09 DIAGNOSIS — L97512 Non-pressure chronic ulcer of other part of right foot with fat layer exposed: Secondary | ICD-10-CM | POA: Diagnosis not present

## 2017-09-09 DIAGNOSIS — E11621 Type 2 diabetes mellitus with foot ulcer: Secondary | ICD-10-CM | POA: Diagnosis not present

## 2017-09-13 ENCOUNTER — Encounter (HOSPITAL_COMMUNITY): Payer: Self-pay

## 2017-09-13 ENCOUNTER — Encounter (HOSPITAL_COMMUNITY)
Admission: RE | Admit: 2017-09-13 | Discharge: 2017-09-13 | Disposition: A | Discharge status: Home / Self Care | Payer: Medicare Other | Source: Ambulatory Visit | Attending: Ophthalmology | Admitting: Ophthalmology

## 2017-09-20 ENCOUNTER — Ambulatory Visit (HOSPITAL_COMMUNITY): Payer: Medicare Other | Admitting: Anesthesiology

## 2017-09-20 ENCOUNTER — Encounter (HOSPITAL_COMMUNITY): Payer: Self-pay | Admitting: Anesthesiology

## 2017-09-20 ENCOUNTER — Ambulatory Visit (HOSPITAL_COMMUNITY)
Admission: RE | Admit: 2017-09-20 | Discharge: 2017-09-20 | Disposition: A | Discharge status: Home / Self Care | Payer: Medicare Other | Source: Ambulatory Visit | Attending: Ophthalmology | Admitting: Ophthalmology

## 2017-09-20 ENCOUNTER — Encounter (HOSPITAL_COMMUNITY)
Admission: RE | Disposition: A | Discharge status: Home / Self Care | Payer: Self-pay | Source: Ambulatory Visit | Attending: Ophthalmology

## 2017-09-20 DIAGNOSIS — Z87891 Personal history of nicotine dependence: Secondary | ICD-10-CM | POA: Diagnosis not present

## 2017-09-20 DIAGNOSIS — E1121 Type 2 diabetes mellitus with diabetic nephropathy: Secondary | ICD-10-CM | POA: Insufficient documentation

## 2017-09-20 DIAGNOSIS — H2512 Age-related nuclear cataract, left eye: Secondary | ICD-10-CM | POA: Diagnosis not present

## 2017-09-20 HISTORY — PX: CATARACT EXTRACTION W/PHACO: SHX586

## 2017-09-20 LAB — GLUCOSE, CAPILLARY: GLUCOSE-CAPILLARY: 123 mg/dL — AB (ref 65–99)

## 2017-09-20 SURGERY — PHACOEMULSIFICATION, CATARACT, WITH IOL INSERTION
Anesthesia: Monitor Anesthesia Care | Site: Eye | Laterality: Left

## 2017-09-20 MED ORDER — LIDOCAINE HCL 3.5 % OP GEL
1.0000 "application " | Freq: Once | OPHTHALMIC | Status: AC
  Administered 2017-09-20: 1 via OPHTHALMIC

## 2017-09-20 MED ORDER — NEOMYCIN-POLYMYXIN-DEXAMETH 3.5-10000-0.1 OP SUSP
OPHTHALMIC | Status: DC | PRN
  Administered 2017-09-20: 2 [drp] via OPHTHALMIC

## 2017-09-20 MED ORDER — NA HYALUR & NA CHOND-NA HYALUR 0.55-0.5 ML IO KIT
PACK | INTRAOCULAR | Status: DC | PRN
  Administered 2017-09-20: 1 via OPHTHALMIC

## 2017-09-20 MED ORDER — MIDAZOLAM HCL 2 MG/2ML IJ SOLN
INTRAMUSCULAR | Status: AC
  Filled 2017-09-20: qty 2

## 2017-09-20 MED ORDER — CYCLOPENTOLATE-PHENYLEPHRINE 0.2-1 % OP SOLN
1.0000 [drp] | OPHTHALMIC | Status: AC
  Administered 2017-09-20 (×3): 1 [drp] via OPHTHALMIC

## 2017-09-20 MED ORDER — MIDAZOLAM HCL 2 MG/2ML IJ SOLN
INTRAMUSCULAR | Status: DC | PRN
  Administered 2017-09-20 (×2): 1 mg via INTRAVENOUS

## 2017-09-20 MED ORDER — EPINEPHRINE PF 1 MG/ML IJ SOLN
INTRAMUSCULAR | Status: AC
  Filled 2017-09-20: qty 1

## 2017-09-20 MED ORDER — BSS IO SOLN
INTRAOCULAR | Status: DC | PRN
  Administered 2017-09-20: 500 mL

## 2017-09-20 MED ORDER — POVIDONE-IODINE 5 % OP SOLN
OPHTHALMIC | Status: DC | PRN
  Administered 2017-09-20: 1 via OPHTHALMIC

## 2017-09-20 MED ORDER — TETRACAINE HCL 0.5 % OP SOLN
1.0000 [drp] | OPHTHALMIC | Status: AC
  Administered 2017-09-20 (×3): 1 [drp] via OPHTHALMIC

## 2017-09-20 MED ORDER — BSS IO SOLN
INTRAOCULAR | Status: DC | PRN
  Administered 2017-09-20: 15 mL

## 2017-09-20 MED ORDER — LACTATED RINGERS IV SOLN
INTRAVENOUS | Status: DC
  Administered 2017-09-20: 1000 mL via INTRAVENOUS

## 2017-09-20 MED ORDER — LIDOCAINE HCL (PF) 1 % IJ SOLN
INTRAMUSCULAR | Status: DC | PRN
  Administered 2017-09-20: .5 mL

## 2017-09-20 MED ORDER — PHENYLEPHRINE HCL 2.5 % OP SOLN
1.0000 [drp] | OPHTHALMIC | Status: AC
  Administered 2017-09-20 (×3): 1 [drp] via OPHTHALMIC

## 2017-09-20 SURGICAL SUPPLY — 10 items
CLOTH BEACON ORANGE TIMEOUT ST (SAFETY) ×1 IMPLANT
EYE SHIELD UNIVERSAL CLEAR (GAUZE/BANDAGES/DRESSINGS) ×1 IMPLANT
GLOVE BIOGEL PI IND STRL 7.0 (GLOVE) IMPLANT
GLOVE BIOGEL PI INDICATOR 7.0 (GLOVE) ×1
LENS ALC ACRYL/TECN (Ophthalmic Related) ×1 IMPLANT
PAD ARMBOARD 7.5X6 YLW CONV (MISCELLANEOUS) ×1 IMPLANT
SYRINGE LUER LOK 1CC (MISCELLANEOUS) ×1 IMPLANT
TAPE SURG TRANSPORE 1 IN (GAUZE/BANDAGES/DRESSINGS) IMPLANT
TAPE SURGICAL TRANSPORE 1 IN (GAUZE/BANDAGES/DRESSINGS) ×1
WATER STERILE IRR 250ML POUR (IV SOLUTION) ×1 IMPLANT

## 2017-09-20 NOTE — Transfer of Care (Signed)
Immediate Anesthesia Transfer of Care Note  Patient: Jessica Russell  Procedure(s) Performed: CATARACT EXTRACTION PHACO AND INTRAOCULAR LENS PLACEMENT LEFT EYE (Left Eye)  Patient Location: Short Stay  Anesthesia Type:MAC  Level of Consciousness: awake and alert   Airway & Oxygen Therapy: Patient Spontanous Breathing  Post-op Assessment: Report given to RN and Post -op Vital signs reviewed and stable  Post vital signs: Reviewed and stable  Last Vitals:  Vitals Value Taken Time  BP    Temp    Pulse    Resp    SpO2      Last Pain:  Vitals:   09/20/17 0959  TempSrc: Oral  PainSc: 0-No pain      Patients Stated Pain Goal: 7 (39/03/00 9233)  Complications: No apparent anesthesia complications

## 2017-09-20 NOTE — Op Note (Signed)
Date of Admission: 09/20/2017  Date of Surgery: 09/20/2017  Pre-Op Dx: Cataract Left  Eye  Post-Op Dx: Senile Nuclear Cataract  Left  Eye,  Dx Code H25.12  Surgeon: [name], M.D.  Assistants: None  Anesthesia: Topical with MAC  Indications: Painless, progressive loss of vision with compromise of daily activities.  Surgery: Cataract Extraction with Intraocular lens Implant Left Eye  Discription: The patient had dilating drops and viscous lidocaine placed into the Left eye in the pre-op holding area. After transfer to the operating room, a time out was performed. The patient was then prepped and draped. Beginning with a 5m blade a paracentesis port was made at the surgeon's 2 o'clock position. The anterior chamber was then filled with 1% non-preserved lidocaine. This was followed by filling the anterior chamber with Provisc.  A 2.475mkeratome blade was used to make a clear corneal incision at the temporal limbus.  A bent cystatome needle was used to create a continuous tear capsulotomy. Hydrodissection was performed with balanced salt solution on a Fine canula. The lens nucleus was then removed using the phacoemulsification handpiece. Residual cortex was removed with the I&A handpiece. The anterior chamber and capsular bag were refilled with Provisc. A posterior chamber intraocular lens was placed into the capsular bag with it's injector. The implant was positioned with the Kuglan hook. The Provisc was then removed from the anterior chamber and capsular bag with the I&A handpiece. Stromal hydration of the main incision and paracentesis port was performed with BSS on a Fine canula. The wounds were tested for leak which was negative. The patient tolerated the procedure well. There were no operative complications. The patient was then transferred to the recovery room in stable condition.  Complications: None  Specimen: None  EBL: None  Prosthetic device: J&J Technis, PCB00, power 22.0, SN  [id number]

## 2017-09-20 NOTE — H&P (Signed)
I have reviewed the H&P, the patient was re-examined, and I have identified no interval changes in medical condition and plan of care since the history and physical of record  

## 2017-09-20 NOTE — Anesthesia Procedure Notes (Signed)
Procedure Name: MAC Date/Time: 09/20/2017 10:40 AM Performed by: Vista Deck, CRNA Pre-anesthesia Checklist: Patient identified, Emergency Drugs available, Suction available, Timeout performed and Patient being monitored Patient Re-evaluated:Patient Re-evaluated prior to induction Oxygen Delivery Method: Nasal Cannula

## 2017-09-20 NOTE — Anesthesia Postprocedure Evaluation (Signed)
Anesthesia Post Note  Patient: Jessica Russell  Procedure(s) Performed: CATARACT EXTRACTION PHACO AND INTRAOCULAR LENS PLACEMENT LEFT EYE (Left Eye)  Patient location during evaluation: Short Stay Anesthesia Type: MAC Level of consciousness: awake and alert and patient cooperative Pain management: satisfactory to patient Vital Signs Assessment: post-procedure vital signs reviewed and stable Respiratory status: spontaneous breathing Cardiovascular status: stable Postop Assessment: no apparent nausea or vomiting Anesthetic complications: no     Last Vitals:  Vitals:   09/20/17 1020 09/20/17 1102  BP:  116/64  Pulse:  68  Resp: 16 16  Temp:  36.4 C  SpO2:  98%    Last Pain:  Vitals:   09/20/17 1102  TempSrc: Oral  PainSc:                  Drucie Opitz

## 2017-09-20 NOTE — Discharge Instructions (Signed)

## 2017-09-20 NOTE — Anesthesia Preprocedure Evaluation (Signed)
Anesthesia Evaluation  Patient identified by MRN, date of birth, ID band Patient awake    Reviewed: Allergy & Precautions, H&P , NPO status , Patient's Chart, lab work & pertinent test results, reviewed documented beta blocker date and time   Airway Mallampati: II  TM Distance: >3 FB Neck ROM: full    Dental no notable dental hx. (+) Implants   Pulmonary neg pulmonary ROS, former smoker,    Pulmonary exam normal breath sounds clear to auscultation       Cardiovascular Exercise Tolerance: Good negative cardio ROS   Rhythm:regular Rate:Normal     Neuro/Psych Diabetic nephropathy  negative neurological ROS  negative psych ROS   GI/Hepatic negative GI ROS, Neg liver ROS,   Endo/Other  negative endocrine ROSdiabetes  Renal/GU Renal diseasenegative Renal ROS  negative genitourinary   Musculoskeletal   Abdominal   Peds  Hematology negative hematology ROS (+)   Anesthesia Other Findings   Reproductive/Obstetrics negative OB ROS                             Anesthesia Physical Anesthesia Plan  ASA: III  Anesthesia Plan: MAC   Post-op Pain Management:    Induction:   PONV Risk Score and Plan:   Airway Management Planned:   Additional Equipment:   Intra-op Plan:   Post-operative Plan:   Informed Consent: I have reviewed the patients History and Physical, chart, labs and discussed the procedure including the risks, benefits and alternatives for the proposed anesthesia with the patient or authorized representative who has indicated his/her understanding and acceptance.   Dental Advisory Given  Plan Discussed with: CRNA  Anesthesia Plan Comments:         Anesthesia Quick Evaluation

## 2017-09-21 ENCOUNTER — Encounter (HOSPITAL_COMMUNITY): Payer: Self-pay | Admitting: Ophthalmology

## 2017-09-22 DIAGNOSIS — L84 Corns and callosities: Secondary | ICD-10-CM | POA: Diagnosis not present

## 2017-09-22 DIAGNOSIS — E11621 Type 2 diabetes mellitus with foot ulcer: Secondary | ICD-10-CM | POA: Diagnosis not present

## 2017-09-22 DIAGNOSIS — E1151 Type 2 diabetes mellitus with diabetic peripheral angiopathy without gangrene: Secondary | ICD-10-CM | POA: Diagnosis not present

## 2017-09-22 DIAGNOSIS — B351 Tinea unguium: Secondary | ICD-10-CM | POA: Diagnosis not present

## 2017-09-22 DIAGNOSIS — M79672 Pain in left foot: Secondary | ICD-10-CM | POA: Diagnosis not present

## 2017-09-22 DIAGNOSIS — L97512 Non-pressure chronic ulcer of other part of right foot with fat layer exposed: Secondary | ICD-10-CM | POA: Diagnosis not present

## 2017-09-22 DIAGNOSIS — M79671 Pain in right foot: Secondary | ICD-10-CM | POA: Diagnosis not present

## 2017-09-29 DIAGNOSIS — E1151 Type 2 diabetes mellitus with diabetic peripheral angiopathy without gangrene: Secondary | ICD-10-CM | POA: Diagnosis not present

## 2017-09-29 DIAGNOSIS — E11621 Type 2 diabetes mellitus with foot ulcer: Secondary | ICD-10-CM | POA: Diagnosis not present

## 2017-09-29 DIAGNOSIS — M79672 Pain in left foot: Secondary | ICD-10-CM | POA: Diagnosis not present

## 2017-09-29 DIAGNOSIS — M79671 Pain in right foot: Secondary | ICD-10-CM | POA: Diagnosis not present

## 2017-10-12 ENCOUNTER — Ambulatory Visit (INDEPENDENT_AMBULATORY_CARE_PROVIDER_SITE_OTHER): Payer: Medicare Other | Admitting: Family Medicine

## 2017-10-12 ENCOUNTER — Encounter: Payer: Self-pay | Admitting: Family Medicine

## 2017-10-12 ENCOUNTER — Other Ambulatory Visit: Payer: Self-pay

## 2017-10-12 VITALS — BP 140/68 | HR 75 | Temp 98.0°F | Resp 16 | Ht 63.0 in | Wt 184.0 lb

## 2017-10-12 DIAGNOSIS — H6121 Impacted cerumen, right ear: Secondary | ICD-10-CM | POA: Diagnosis not present

## 2017-10-12 DIAGNOSIS — R350 Frequency of micturition: Secondary | ICD-10-CM | POA: Diagnosis not present

## 2017-10-12 DIAGNOSIS — N939 Abnormal uterine and vaginal bleeding, unspecified: Secondary | ICD-10-CM

## 2017-10-12 DIAGNOSIS — E1142 Type 2 diabetes mellitus with diabetic polyneuropathy: Secondary | ICD-10-CM | POA: Diagnosis not present

## 2017-10-12 DIAGNOSIS — E118 Type 2 diabetes mellitus with unspecified complications: Secondary | ICD-10-CM

## 2017-10-12 DIAGNOSIS — R030 Elevated blood-pressure reading, without diagnosis of hypertension: Secondary | ICD-10-CM | POA: Diagnosis not present

## 2017-10-12 MED ORDER — METFORMIN HCL 500 MG PO TABS: 500.0000 mg | ORAL_TABLET | Freq: Every day | ORAL | 0 refills | Status: DC

## 2017-10-12 MED ORDER — GABAPENTIN 400 MG PO CAPS: 800.0000 mg | ORAL_CAPSULE | Freq: Four times a day (QID) | ORAL | 0 refills | Status: DC

## 2017-10-12 NOTE — Patient Instructions (Addendum)
Omron Blood Pressure Cuff Bring it to your next visit. Check BP at home and record.  Call if BP is running greater than 140/90 But follow up in 1 month Goal BP is 130/80

## 2017-10-12 NOTE — Progress Notes (Signed)
Patient ID: Jessica Russell, female    DOB: [birth year], 84 y.o.   MRN: [id number]  Chief Complaint  Patient presents with  . Establish Care    former Jessica Russell patient    Allergies Phenergan [promethazine hcl]  Subjective:   Jessica Russell is a 84 y.o. female who presents to [facility] today.  HPI [name] presents here as a new patient visit to establish care. Has previously been seen by Dr. Lysle Russell in this office.  She reports that she moved to this area in 02/2017 after living in [place], [place] for over 50 years.  She moved to [place], [place] with her husband because her only son lives in this area.  Has also been seen by Dr. Constance Russell and podiatry due to healing diabetic food ulcer/foot wound that got infected after stepping on something and getting a foot wound.  Has been on metformin 500 mg p.o. daily and sugars have been controlled. No hypoglycemia episodes.  Has neuropathy in feet from diabetes.  And has been on a large dose of gabapentin for many years.  She reports that she tries to eat a healthy diet. Wears a diabetic shoe. Is walking some now, but has been restricted with walking some due to keeping pressure off the ulcer. Has been diabetic for 3 years but was borderline diabetic for years.  Has only been on metformin in the past and never been on insulin or other medications for her diabetes  Is not on BP or cholesterol medication.  Reports that has been told her blood pressure is slightly elevated in the past but is never been started on any medication.  She does not check her blood pressure at home.  Does take gabapentin for the neuropathy. The medication helps with the nerve pain and makes it bearable. If misses the dose of medication, feels like fire ants are crawling all over legs and feet.   She denies any chest pain, shortness of breath, swelling in her extremities, or palpitations. She is not on a cholesterol medication nor does she wish to start one.   She does not wish to have her cholesterol checked.  She reports that her vision is good.  Energy level is good.  Reports her mood is good.  She reports that over the past several months she is felt like she has had a urinary tract infection on and off.  She reports some occasional burning with urination.  She reports that last year prior to moving to this area she had a D&C performed due to a uterine polyp.  She reports that there was no cancer.  We do not have these records.  She reports that she has had 3-4 episodes of mild vaginal bleeding since this procedure.  She reports the bleeding is not heavy nor does she have pain with it.  She reports that she wears a panty liner and has noticed the blood on the panty liner.  She does not see blood in her urine.  She reports that she is urinating well.  She reports that in the past she has been on tamsulosin to help with her stream of urination.  She reports her weight has been stable.  She denies any night sweats.  She denies any lymph node enlargement.  She reports her bowels are working normally.    Past Medical History:  Diagnosis Date  . Allergy   . Arthritis   . Cancer ([place]) 1997, 2000   breast  .  Cataract   . Diabetes mellitus without complication ([place])   . Diabetic nephropathy ([place])   . HLD (hyperlipidemia) 03/24/2017  . Neuropathy   . Osteoporosis 04/14/2017    Past Surgical History:  Procedure Laterality Date  . APPENDECTOMY    . BILATERAL CARPAL TUNNEL RELEASE Right   . BREAST SURGERY    . BUNIONECTOMY Bilateral 1968  . CATARACT EXTRACTION W/PHACO Right 08/30/2017   Procedure: CATARACT EXTRACTION PHACO AND INTRAOCULAR LENS PLACEMENT RIGHT EYE;  Surgeon: [name], MD;  Location: [facility];  Service: Ophthalmology;  Laterality: Right;  CDE: 13.63  . CATARACT EXTRACTION W/PHACO Left 09/20/2017   Procedure: CATARACT EXTRACTION PHACO AND INTRAOCULAR LENS PLACEMENT LEFT EYE;  Surgeon: [name], MD;  Location: [facility];  Service:  Ophthalmology;  Laterality: Left;  CDE: 11.25  . 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 FRACTURE SURGERY Left    knee fracture  . HERNIA REPAIR     ventral    Family History  Problem Relation Age of Onset  . Diabetes Mother   . Heart disease Mother   . Arthritis Mother   . Hypertension Mother   . Miscarriages / [place] Mother   . Early death Father   . Stroke Father   . Alcohol abuse Father      Social History   Socioeconomic History  . Marital status: Married    Spouse name: Not on file  . Number of children: 1  . Years of education: 63  . Highest education level: Not on file  Occupational History  . Occupation: retired  Scientific laboratory technician  . Financial resource strain: Not on file  . Food insecurity:    Worry: Not on file    Inability: Not on file  . Transportation needs:    Medical: Not on file    Non-medical: Not on file  Tobacco Use  . Smoking status: Former Smoker    Packs/day: 1.00    Years: 12.00    Pack years: 12.00    Types: Cigarettes    Start date: 05/11/1950    Last attempt to quit: 05/12/1963    Years since quitting: 54.4  . Smokeless tobacco: Never Used  Substance and Sexual Activity  . Alcohol use: No    Frequency: Never  . Drug use: No  . Sexual activity: Not Currently  Lifestyle  . Physical activity:    Days per week: Not on file    Minutes per session: Not on file  . Stress: Not on file  Relationships  . Social connections:    Talks on phone: Not on file    Gets together: Not on file    Attends religious service: Not on file    Active member of club or organization: Not on file    Attends meetings of clubs or organizations: Not on file    Relationship status: Not on file  Other Topics Concern  . Not on file  Social History Narrative   Lives with husband Jessica Russell   One son Jessica Russell lives in [place]    Father was a [place] from [place], patient is bilingual   Married for over 59 years.       Current Outpatient  Medications on File Prior to Visit  Medication Sig Dispense Refill  . metFORMIN (GLUCOPHAGE) 500 MG tablet Take 1 tablet (500 mg total) by mouth daily after supper. 90 tablet 0  . Methylsulfonylmethane (MSM) 1000 MG TABS Take 3 tablets by mouth daily.  JOINT    . Multiple Vitamin (MULTIVITAMIN WITH MINERALS) TABS tablet Take 1 tablet by mouth daily.    . prednisoLONE-Bromfenac 1-0.075 % SOLN Place 1 drop into the right eye 3 (three) times daily. For 3 weeks    . protein supplement shake (PREMIER PROTEIN) LIQD Take 11 oz by mouth every evening.    . Turmeric, Curcuma Longa, (CURCUMIN) POWD Take 2-3 capsules by mouth 3 (three) times daily as needed (FOR PAIN.).    [name] UNABLE TO FIND Disp 1 transport  wheel chair  Dx   L79.892,  E11.628 1 each 0  . UNABLE TO FIND Diabetic shoes x 1 inserts x 3  dx 1 each 0   No current facility-administered medications on file prior to visit.     Review of Systems  Constitutional: Negative for activity change, appetite change and fever.  HENT: Negative for ear discharge, ear pain, trouble swallowing and voice change.        She reports that she feels like she has earwax buildup.  She reports just get her ears cleaned out several times a year and would like to have her ears cleaned out today.  She reports that her right ear feels like it is full of wax.  She denies any ear drainage.  No ear pain.  Eyes: Negative for visual disturbance.  Respiratory: Negative for cough, chest tightness and shortness of breath.   Cardiovascular: Negative for chest pain, palpitations and leg swelling.  Gastrointestinal: Negative for abdominal pain, diarrhea, nausea and vomiting.  Endocrine: Positive for polyuria.  Genitourinary: Positive for vaginal bleeding. Negative for dysuria, frequency and urgency.       No current dysuria.  No low back pain.  Denies any fever or chills.  No vaginal discharge.  Musculoskeletal: Negative for myalgias.  Neurological: Positive for numbness.  Negative for dizziness, syncope and light-headedness.       Chronic numbness in her lower extremities.  Hematological: Negative for adenopathy. Does not bruise/bleed easily.  Psychiatric/Behavioral: Negative for dysphoric mood. The patient is not nervous/anxious.      Objective:   BP 140/68 (BP Location: Left Arm, Patient Position: Sitting, Cuff Size: Large)   Pulse 75   Temp 98 F (36.7 C) (Temporal)   Resp 16   Ht 5\' 3"  (1.6 m)   Wt 184 lb 0.6 oz (83.5 kg)   SpO2 98%   BMI 32.60 kg/m   Physical Exam  Constitutional: She is oriented to person, place, and time. She appears well-developed and well-nourished. No distress.  HENT:  Head: Normocephalic and atraumatic.  Left external ear canal clear.  TM visualized and within normal limits.  Right tympanic membrane visualized but ear canal partially obstructed with deep canal wax.  Ear canals irrigated with mild improvement of symptoms.  Patient with persistent feeling of wax in her right ear.  Eyes: Pupils are equal, round, and reactive to light. No scleral icterus.  Neck: Normal range of motion. Neck supple. No thyromegaly present.  Cardiovascular: Normal rate, regular rhythm and normal heart sounds.  Pulmonary/Chest: Effort normal and breath sounds normal. No respiratory distress.  Abdominal: Soft. Bowel sounds are normal. She exhibits no distension.  Neurological: She is alert and oriented to person, place, and time. No cranial nerve deficit.  Skin: Skin is warm and dry.  Psychiatric: She has a normal mood and affect. Her behavior is normal. Judgment and thought content normal.  Nursing note and vitals reviewed.  Depression screen [facility] 2/9 10/12/2017 03/24/2017  Decreased  Interest 0 0  Down, Depressed, Hopeless 0 0  PHQ - 2 Score 0 0    Assessment and Plan  1. Diabetic polyneuropathy associated with type 2 diabetes mellitus ([place]) Sedation precautions of medication discussed with patient.  She does not drive.  She understands the  fall risk.  She has been on these medications chronically. - gabapentin (NEURONTIN) 400 MG capsule; Take 2 capsules (800 mg total) by mouth 4 (four) times daily. BREAKFAST, LUNCH, DINNER, & BEDTIME.  Dispense: 720 capsule; Refill: 0  2. Controlled type 2 diabetes mellitus with complication, without long-term current use of insulin ([place]) Continue medication as directed.  Check BMP.Patient counseled in detail regarding the risks of medication. Told to call or return to clinic if develop any worrisome signs or symptoms. Patient voiced understanding.  -Patient defers starting statin medication. - Basic metabolic panel - metFORMIN (GLUCOPHAGE) 500 MG tablet; Take 1 tablet (500 mg total) by mouth daily after supper.  Dispense: 90 tablet; Refill: 0  3. Urinary frequency Will rule out urinary tract infection. - Urinalysis, Routine w reflex microscopic - Urine Culture  4. Vaginal bleeding Discussed with patient that she will need to get the records to share with the OB/GYN from her procedures.  Will check CBC at this time. - Ambulatory referral to Obstetrics / Gynecology - CBC with Differential/Platelet  5. Elevated BP without diagnosis of hypertension Blood pressure goal discussed with patient today.  She will plan on buying an Omron cuff.  Will bring her cuff and readings to the office visit.  She reports that she believes her blood pressure is elevated today due to the fact that she is new to the practice and this is her first visit.  Will defer starting medication today and recheck in 1 month.  She will call with any questions, concerns, or problems.  6. Impacted cerumen of right ear Discussed with patient that the wax in her right ear canal was too deep and close to her tympanic membrane for me to try to remove with a curette.  She voiced understanding.  Will refer to ENT for removal. - Ambulatory referral to ENT  Return in about 1 month (around 11/09/2017). Caren Macadam, MD 10/12/2017

## 2017-10-13 ENCOUNTER — Telehealth: Payer: Self-pay | Admitting: Family Medicine

## 2017-10-13 ENCOUNTER — Encounter: Payer: Self-pay | Admitting: Family Medicine

## 2017-10-13 DIAGNOSIS — E1151 Type 2 diabetes mellitus with diabetic peripheral angiopathy without gangrene: Secondary | ICD-10-CM | POA: Diagnosis not present

## 2017-10-13 DIAGNOSIS — L97512 Non-pressure chronic ulcer of other part of right foot with fat layer exposed: Secondary | ICD-10-CM | POA: Diagnosis not present

## 2017-10-13 DIAGNOSIS — M21611 Bunion of right foot: Secondary | ICD-10-CM | POA: Diagnosis not present

## 2017-10-13 DIAGNOSIS — E11621 Type 2 diabetes mellitus with foot ulcer: Secondary | ICD-10-CM | POA: Diagnosis not present

## 2017-10-13 LAB — BASIC METABOLIC PANEL
BUN: 16 mg/dL (ref 7–25)
CO2: 28 mmol/L (ref 20–32)
CREATININE: 0.65 mg/dL (ref 0.60–0.88)
Calcium: 9.7 mg/dL (ref 8.6–10.4)
Chloride: 104 mmol/L (ref 98–110)
GLUCOSE: 98 mg/dL (ref 65–139)
Potassium: 4.3 mmol/L (ref 3.5–5.3)
Sodium: 140 mmol/L (ref 135–146)

## 2017-10-13 LAB — CBC WITH DIFFERENTIAL/PLATELET
Basophils Absolute: 73 cells/uL (ref 0–200)
Basophils Relative: 1.2 %
EOS PCT: 2.6 %
Eosinophils Absolute: 159 cells/uL (ref 15–500)
HEMATOCRIT: 40.5 % (ref 35.0–45.0)
Hemoglobin: 13.6 g/dL (ref 11.7–15.5)
Lymphs Abs: 1208 cells/uL (ref 850–3900)
MCH: 27.9 pg (ref 27.0–33.0)
MCHC: 33.6 g/dL (ref 32.0–36.0)
MCV: 83.2 fL (ref 80.0–100.0)
MPV: 11.7 fL (ref 7.5–12.5)
Monocytes Relative: 8.4 %
Neutro Abs: 4148 cells/uL (ref 1500–7800)
Neutrophils Relative %: 68 %
PLATELETS: 224 10*3/uL (ref 140–400)
RBC: 4.87 10*6/uL (ref 3.80–5.10)
RDW: 14.3 % (ref 11.0–15.0)
TOTAL LYMPHOCYTE: 19.8 %
WBC: 6.1 10*3/uL (ref 3.8–10.8)
WBCMIX: 512 {cells}/uL (ref 200–950)

## 2017-10-13 LAB — URINALYSIS, ROUTINE W REFLEX MICROSCOPIC
Bilirubin Urine: NEGATIVE
Glucose, UA: NEGATIVE
HYALINE CAST: NONE SEEN /LPF
Hgb urine dipstick: NEGATIVE
KETONES UR: NEGATIVE
Nitrite: NEGATIVE
Protein, ur: NEGATIVE
SPECIFIC GRAVITY, URINE: 1.012 (ref 1.001–1.03)

## 2017-10-13 LAB — URINE CULTURE
MICRO NUMBER:: 90672920
SPECIMEN QUALITY: ADEQUATE

## 2017-10-13 MED ORDER — SULFAMETHOXAZOLE-TRIMETHOPRIM 800-160 MG PO TABS: 1.0000 | ORAL_TABLET | Freq: Two times a day (BID) | ORAL | 0 refills | Status: DC

## 2017-10-13 NOTE — Telephone Encounter (Signed)
Please call patient and advised that I have received the urinalysis results from the lab.  She does have the presence of bacteria and white blood cells in her urine.  At this time, I would recommend that we go ahead and treat her for urinary tract infection and start the antibiotics.  I will likely not received the urine culture results back for several more days.  However, I would recommend that she start Bactrim DS, 1 p.o. twice daily x7 days.  Once I receive the results from the urine culture back I will let her know the results.  In addition, please advise her to make sure that she follows up with gynecology regarding the vaginal bleeding.  There was no evidence of blood in her urine.

## 2017-10-13 NOTE — Telephone Encounter (Signed)
PT left a VM returning your call.

## 2017-10-13 NOTE — Telephone Encounter (Signed)
Left message on vm requesting call back to discuss recent lab results.

## 2017-10-14 NOTE — Telephone Encounter (Signed)
Spoke with patient and advised of available results with verbal understanding.

## 2017-10-15 ENCOUNTER — Encounter: Payer: Self-pay | Admitting: Obstetrics and Gynecology

## 2017-10-15 ENCOUNTER — Ambulatory Visit (INDEPENDENT_AMBULATORY_CARE_PROVIDER_SITE_OTHER): Payer: Medicare Other | Admitting: Obstetrics and Gynecology

## 2017-10-15 VITALS — BP 109/63 | HR 67 | Ht 63.0 in | Wt 189.5 lb

## 2017-10-15 DIAGNOSIS — N950 Postmenopausal bleeding: Secondary | ICD-10-CM

## 2017-10-15 NOTE — Progress Notes (Signed)
Patient ID: Jessica Russell, female   DOB: September 04, 1927, 84 y.o.   MRN: [id number]    [facility] Visit  @DATE @            Patient name: Jessica Russell MRN [id number]  Date of birth: 1928-02-09  CC & HPI:  Jessica Russell is a 84 y.o. female presenting today for vaginal bleeding on 10/04/17. She had a D&C last year. She has also had polyps removed but concerned that she has more polyps. She was wearing a panty liner and went she went to the bathroom she noticed blood but as she goes about through her day the bleeding is limited. She notes that she had injured her knee and while she was in the hospital they noticed her bleeding. She was referred to our office by Dr. Mannie Stabile Patient denies fever, chills or any other symptoms or complaints at this time.  ROS:  ROS +Vaginal bleeding + Bladder drop All systems are negative except as noted in the HPI and PMH.   Pertinent History Reviewed:   Reviewed: Significant for D&C thousand 15 by Dr. Elonda Husky  which reportedly polyps Medical         Past Medical History:  Diagnosis Date  . Allergy   . Arthritis   . Cancer ([address]) 1997, 2000   breast  . Cataract   . Diabetes mellitus without complication ([place])   . Diabetic nephropathy ([place])   . HLD (hyperlipidemia) 03/24/2017  . Neuropathy   . Osteoporosis 04/14/2017                              Surgical Hx:    Past Surgical History:  Procedure Laterality Date  . APPENDECTOMY    . BILATERAL CARPAL TUNNEL RELEASE Right   . BREAST SURGERY    . BUNIONECTOMY Bilateral 1968  . CATARACT EXTRACTION W/PHACO Right 08/30/2017   Procedure: CATARACT EXTRACTION PHACO AND INTRAOCULAR LENS PLACEMENT RIGHT EYE;  Surgeon: [name], MD;  Location: [facility];  Service: Ophthalmology;  Laterality: Right;  CDE: 13.63  . CATARACT EXTRACTION W/PHACO Left 09/20/2017   Procedure: CATARACT EXTRACTION PHACO AND INTRAOCULAR LENS PLACEMENT LEFT EYE;  Surgeon: [name], MD;  Location: [facility];  Service: Ophthalmology;   Laterality: Left;  CDE: 11.25  . 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 FRACTURE SURGERY Left    knee fracture  . HERNIA REPAIR     ventral   Medications: Reviewed & Updated - see associated section                       Current Outpatient Medications:  .  gabapentin (NEURONTIN) 400 MG capsule, Take 2 capsules (800 mg total) by mouth 4 (four) times daily. BREAKFAST, LUNCH, DINNER, & BEDTIME., Disp: 720 capsule, Rfl: 0 .  metFORMIN (GLUCOPHAGE) 500 MG tablet, Take 1 tablet (500 mg total) by mouth daily after supper., Disp: 90 tablet, Rfl: 0 .  Methylsulfonylmethane (MSM) 1000 MG TABS, Take 3 tablets by mouth daily. JOINT, Disp: , Rfl:  .  Multiple Vitamin (MULTIVITAMIN WITH MINERALS) TABS tablet, Take 1 tablet by mouth daily., Disp: , Rfl:  .  prednisoLONE-Bromfenac 1-0.075 % SOLN, Place 1 drop into the right eye 3 (three) times daily. For 3 weeks, Disp: , Rfl:  .  protein supplement shake (PREMIER PROTEIN) LIQD, Take 11 oz by mouth every evening., Disp: , Rfl:  .  sulfamethoxazole-trimethoprim (BACTRIM DS,SEPTRA DS) 800-160 MG tablet, Take 1 tablet by mouth 2 (two) times daily., Disp: 14 tablet, Rfl: 0 .  Turmeric, Curcuma Longa, (CURCUMIN) POWD, Take 2-3 capsules by mouth 3 (three) times daily as needed (FOR PAIN.)., Disp: , Rfl:  .  UNABLE TO FIND, Disp 1 transport  wheel chair  Dx   [id number],  [phone] (Patient not taking: Reported on 10/15/2017), Disp: 1 each, Rfl: 0 .  UNABLE TO FIND, Diabetic shoes x 1 inserts x 3  dx (Patient not taking: Reported on 10/15/2017), Disp: 1 each, Rfl: 0   Social History: Reviewed -  reports that she quit smoking about 54 years ago. Her smoking use included cigarettes. She started smoking about 67 years ago. She has a 12.00 pack-year smoking history. She has never used smokeless tobacco.  Objective Findings:  Vitals: Blood pressure 109/63, pulse 67, height 5\' 3"  (1.6 m), weight 189 lb 8 oz (86 kg).  PHYSICAL EXAMINATION General appearance  - alert, well appearing, and in no distress and oriented to person, place, and time Mental status - alert, oriented to person, place, and time, normal mood, behavior, speech, dress, motor activity, and thought processes, affect appropriate to mood  PELVIC Vagina - Grade 2 cystocele to introtus Cervix - Moderate cervix descensus 3cm to detrotus Uterus - maltiporous  Second degree descensus Assessment & Plan:   A:  1.  Recurrent PMB  P:  1.  F/u for in 3 weeks ultrasound 2. F/u in 4 weeks for pessary fitting to see if that will help support   By signing my name below, I, Jessica Russell, attest that this documentation has been prepared under the direction and in the presence of Jonnie Kind, MD. Electronically Signed: [place]. 10/15/17. 12:26 PM.  I personally performed the services described in this documentation, which was SCRIBED in my presence. The recorded information has been reviewed and considered accurate. It has been edited as necessary during review. Jonnie Kind, MD

## 2017-10-29 DIAGNOSIS — E1151 Type 2 diabetes mellitus with diabetic peripheral angiopathy without gangrene: Secondary | ICD-10-CM | POA: Diagnosis not present

## 2017-10-29 DIAGNOSIS — L97512 Non-pressure chronic ulcer of other part of right foot with fat layer exposed: Secondary | ICD-10-CM | POA: Diagnosis not present

## 2017-10-29 DIAGNOSIS — M21611 Bunion of right foot: Secondary | ICD-10-CM | POA: Diagnosis not present

## 2017-10-29 DIAGNOSIS — E11621 Type 2 diabetes mellitus with foot ulcer: Secondary | ICD-10-CM | POA: Diagnosis not present

## 2017-11-04 ENCOUNTER — Ambulatory Visit: Payer: Medicare Other | Admitting: Family Medicine

## 2017-11-05 ENCOUNTER — Ambulatory Visit (INDEPENDENT_AMBULATORY_CARE_PROVIDER_SITE_OTHER): Payer: Medicare Other | Admitting: Family Medicine

## 2017-11-05 ENCOUNTER — Other Ambulatory Visit: Payer: Self-pay

## 2017-11-05 ENCOUNTER — Other Ambulatory Visit: Payer: Medicare Other

## 2017-11-05 ENCOUNTER — Encounter: Payer: Self-pay | Admitting: Family Medicine

## 2017-11-05 VITALS — BP 126/60 | HR 76 | Temp 98.2°F | Resp 18 | Ht 63.0 in | Wt 183.0 lb

## 2017-11-05 DIAGNOSIS — R030 Elevated blood-pressure reading, without diagnosis of hypertension: Secondary | ICD-10-CM | POA: Diagnosis not present

## 2017-11-05 DIAGNOSIS — E1140 Type 2 diabetes mellitus with diabetic neuropathy, unspecified: Secondary | ICD-10-CM | POA: Diagnosis not present

## 2017-11-05 DIAGNOSIS — Z23 Encounter for immunization: Secondary | ICD-10-CM

## 2017-11-05 NOTE — Patient Instructions (Signed)
Referral to Dr. Natasha Bence Neurology You will get your tetanus shot today.  Follow up in 2 months. Keep you scheduled appointment with Gynecology.

## 2017-11-05 NOTE — Progress Notes (Signed)
Patient ID: [name], female    DOB: Oct 30, 1927, 84 y.o.   MRN: [id number]  Chief Complaint  Patient presents with  . Diabetes    follow up  . Hypertension    Allergies Phenergan [promethazine hcl]  Subjective:   Ovie Eastep is a 84 y.o. female who presents to [facility] today.  HPI Here for follow up of BP.  At her last visit her blood pressure was elevated in the 140s.  She has been checking her blood pressure at home and brings in her cuff today to the visit. He is taking all of her medications as directed.  She reports that she is not having headaches.  Her blood pressures been controlled at home.  She denies any chest pain, shortness of breath, vision changes.  She reports that the medical issue that affects her quality of life is the diabetic foot pain.  Diabetic foot pain is terrible. Bothers terribly qd. Feels like the blood in her legs is burning and that she has red ants crawling on her legs. . Has been on lyrica in the past but it made her symptoms worse. Reports that at night does use 2 tylenol for pain and it helps. Is on neurontin 400 mg tablets, 2 pills qid. Has been seen by neurologist at [facility], and a neurologist in [place].  She has not been placed on any other medications for the neuropathy.  She has used a vibratory foot pad in the past without much help.  She is also followed by the podiatry is currently in a boot for diabetic foot lesion.  She is also developed a hammertoe in her left great toe and is now being seen by podiatry for this.  Her sugars have been running well.  They are always less than 130.  She denies any hypoglycemic episodes.  She is only on metformin 500 mg a day.  Her memory is good.  She reports her mood is good.  Her energy level is fairly well. BP at home 112-120/70-80s.      Past Medical History:  Diagnosis Date  . Allergy   . Arthritis   . Cancer ([place]) 1997, 2000   breast  . Cataract   .  Diabetes mellitus without complication ([place])   . Diabetic nephropathy ([place])   . HLD (hyperlipidemia) 03/24/2017  . Neuropathy   . Osteoporosis 04/14/2017    Past Surgical History:  Procedure Laterality Date  . APPENDECTOMY    . BILATERAL CARPAL TUNNEL RELEASE Right   . BREAST SURGERY    . BUNIONECTOMY Bilateral 1968  . CATARACT EXTRACTION W/PHACO Right 08/30/2017   Procedure: CATARACT EXTRACTION PHACO AND INTRAOCULAR LENS PLACEMENT RIGHT EYE;  Surgeon: [name], MD;  Location: [facility];  Service: Ophthalmology;  Laterality: Right;  CDE: 13.63  . CATARACT EXTRACTION W/PHACO Left 09/20/2017   Procedure: CATARACT EXTRACTION PHACO AND INTRAOCULAR LENS PLACEMENT LEFT EYE;  Surgeon: [name], MD;  Location: [facility];  Service: Ophthalmology;  Laterality: Left;  CDE: 11.25  . 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 FRACTURE SURGERY Left    knee fracture  . HERNIA REPAIR     ventral    Family History  Problem Relation Age of Onset  . Diabetes Mother   . Heart disease Mother   . Arthritis Mother   . Hypertension Mother   . Miscarriages / [place] Mother   . Early death Father   . Stroke  Father   . Alcohol abuse Father      Social History   Socioeconomic History  . Marital status: Married    Spouse name: Not on file  . Number of children: 1  . Years of education: 17  . Highest education level: Not on file  Occupational History  . Occupation: retired  Scientific laboratory technician  . Financial resource strain: Not on file  . Food insecurity:    Worry: Not on file    Inability: Not on file  . Transportation needs:    Medical: Not on file    Non-medical: Not on file  Tobacco Use  . Smoking status: Former Smoker    Packs/day: 1.00    Years: 12.00    Pack years: 12.00    Types: Cigarettes    Start date: 05/11/1950    Last attempt to quit: 05/12/1963    Years since quitting: 54.5  . Smokeless tobacco: Never Used  Substance and Sexual Activity  . Alcohol use: No     Frequency: Never  . Drug use: No  . Sexual activity: Not Currently  Lifestyle  . Physical activity:    Days per week: Not on file    Minutes per session: Not on file  . Stress: Not on file  Relationships  . Social connections:    Talks on phone: Not on file    Gets together: Not on file    Attends religious service: Not on file    Active member of club or organization: Not on file    Attends meetings of clubs or organizations: Not on file    Relationship status: Not on file  Other Topics Concern  . Not on file  Social History Narrative   Lives with husband [name]   One son [name] lives in [place]    Father was a [place] from [place], patient is bilingual   Married for over 25 years.       Current Outpatient Medications on File Prior to Visit  Medication Sig Dispense Refill  . gabapentin (NEURONTIN) 400 MG capsule Take 2 capsules (800 mg total) by mouth 4 (four) times daily. BREAKFAST, LUNCH, DINNER, & BEDTIME. 720 capsule 0  . metFORMIN (GLUCOPHAGE) 500 MG tablet Take 1 tablet (500 mg total) by mouth daily after supper. 90 tablet 0  . Methylsulfonylmethane (MSM) 1000 MG TABS Take 3 tablets by mouth daily. JOINT    . Multiple Vitamin (MULTIVITAMIN WITH MINERALS) TABS tablet Take 1 tablet by mouth daily.    . protein supplement shake (PREMIER PROTEIN) LIQD Take 11 oz by mouth every evening.    . Turmeric, Curcuma Longa, (CURCUMIN) POWD Take 1 capsule by mouth daily.     [name] UNABLE TO FIND Disp 1 transport  wheel chair  Dx   Q46.962,  E11.628 1 each 0  . UNABLE TO FIND Diabetic shoes x 1 inserts x 3  dx 1 each 0   No current facility-administered medications on file prior to visit.     Review of Systems   Objective:   BP 126/60 (BP Location: Right Arm, Patient Position: Sitting, Cuff Size: Large)   Pulse 76   Temp 98.2 F (36.8 C) (Temporal)   Resp 18   Ht 5\' 3"  (1.6 m)   Wt 183 lb 0.6 oz (83 kg)   SpO2 97%   BMI 32.42 kg/m   Physical Exam  Constitutional: She  is oriented to person, place, and time. She appears well-developed and well-nourished.  HENT:  Head:  Normocephalic and atraumatic.  Neck: Normal range of motion. Neck supple.  Cardiovascular: Normal rate, regular rhythm and normal heart sounds.  Pulmonary/Chest: Effort normal and breath sounds normal.  Abdominal: Soft. Bowel sounds are normal.  Neurological: She is alert and oriented to person, place, and time.  Skin: Skin is dry. Capillary refill takes less than 2 seconds.  Psychiatric: She has a normal mood and affect. Her behavior is normal. Thought content normal.  Vitals reviewed.  Depression screen [facility] 2/9 11/05/2017 10/12/2017 03/24/2017  Decreased Interest 0 0 0  Down, Depressed, Hopeless 0 0 0  PHQ - 2 Score 0 0 0    Assessment and Plan  1. Type 2 diabetes mellitus with diabetic neuropathy, without long-term current use of insulin ([place]) Patient with significant diabetic peripheral neuropathy on high doses of Neurontin with persistent symptoms which are debilitating.  She has been seen by neurology at [facility] and in [place].  She wishes to be evaluated by neurology for this condition.  She will also discussed with her podiatry any interventions such as vibratory foot devices, which would be helpful with this condition. Diabetes is well controlled at this time.  Her hemoglobin A1c was 6.1% in April/2019.  She is on metformin 500 mg a day. - Ambulatory referral to Neurology -She is not on an ACE inhibitor but will for initiating this at this time for renal protection due to her age and reacting. 2. Immunization due At request tetanus shot today.  She also would like to get the Shingrix vaccination.  I did ask her to please check with her insurance coverage for the Shingrix prior to [place] giving the vaccination due to the costly nature of the vaccine.  She is agreeable to this and will follow-up for a vaccination if she chooses. - Td : Tetanus/diphtheria >7yo Preservative   free  3.  Normotensive blood pressure Blood pressures are running well at home.  Her readings are basically 120-130/70-80.  She does have some lower readings than this but none are elevated greater than 130.  I do not believe that she needs medication for blood pressure control at this time.  She will continue to monitor and call our office if she has any problems.  She will follow-up in 2 months or sooner if needed.  She will call with any questions or concerns.  She plans on transferring her medical care to Dr. Wende Neighbors in [place], [place]. Return in about 2 months (around 01/05/2018). Caren Macadam, MD 11/05/2017

## 2017-11-09 ENCOUNTER — Other Ambulatory Visit: Payer: Self-pay | Admitting: Obstetrics and Gynecology

## 2017-11-09 DIAGNOSIS — E11621 Type 2 diabetes mellitus with foot ulcer: Secondary | ICD-10-CM | POA: Diagnosis not present

## 2017-11-09 DIAGNOSIS — L97512 Non-pressure chronic ulcer of other part of right foot with fat layer exposed: Secondary | ICD-10-CM | POA: Diagnosis not present

## 2017-11-09 DIAGNOSIS — N950 Postmenopausal bleeding: Secondary | ICD-10-CM

## 2017-11-09 DIAGNOSIS — M21611 Bunion of right foot: Secondary | ICD-10-CM | POA: Diagnosis not present

## 2017-11-09 DIAGNOSIS — E1151 Type 2 diabetes mellitus with diabetic peripheral angiopathy without gangrene: Secondary | ICD-10-CM | POA: Diagnosis not present

## 2017-11-10 ENCOUNTER — Ambulatory Visit (INDEPENDENT_AMBULATORY_CARE_PROVIDER_SITE_OTHER): Payer: Medicare Other

## 2017-11-10 DIAGNOSIS — N950 Postmenopausal bleeding: Secondary | ICD-10-CM | POA: Diagnosis not present

## 2017-11-10 NOTE — Progress Notes (Addendum)
PELVIC US TA/TV: heterogeneous atrophic anteverted uterus,EEC 3 mm,normal ovaries bilat,ovaries appear mobile,no free fluid,no pain during ultrasound

## 2017-11-12 ENCOUNTER — Ambulatory Visit: Payer: Medicare Other | Admitting: Family Medicine

## 2017-11-15 ENCOUNTER — Encounter: Payer: Self-pay | Admitting: Obstetrics and Gynecology

## 2017-11-15 ENCOUNTER — Telehealth: Payer: Self-pay | Admitting: Obstetrics and Gynecology

## 2017-11-15 ENCOUNTER — Ambulatory Visit (INDEPENDENT_AMBULATORY_CARE_PROVIDER_SITE_OTHER): Payer: Medicare Other | Admitting: Obstetrics and Gynecology

## 2017-11-15 VITALS — BP 136/76 | HR 75 | Ht 63.0 in | Wt 182.4 lb

## 2017-11-15 DIAGNOSIS — Z4689 Encounter for fitting and adjustment of other specified devices: Secondary | ICD-10-CM

## 2017-11-15 NOTE — Progress Notes (Signed)
Patient ID: Jessica Russell, female   DOB: 26-Nov-1927, 84 y.o.   MRN: [id number]   [facility] Visit  @DATE @            Patient name: Jessica Russell MRN [id number]  Date of birth: 23-Dec-1927  CC & HPI:  Jessica Russell is a 84 y.o. female presenting today for followup of u/s for PMB and pessary fitting Ultrasound is reviewed and shows a 2.8 mm endometrial stripe and no areas of endometrial thickening that are concerning.  I personally reviewed the ultrasound original photos and agree with interpretation  ROS:  ROS   Pertinent History Reviewed:   Reviewed: Significant for foot infection right foot December she is only recently begun to be able to walk again and has a protective boot at this time she is very weak and unsteady on her feet.  Exercises for leg strengthening discussed Medical         Past Medical History:  Diagnosis Date  . Allergy   . Arthritis   . Cancer ([place]) 1997, 2000   breast  . Cataract   . Diabetes mellitus without complication ([place])   . Diabetic nephropathy ([place])   . HLD (hyperlipidemia) 03/24/2017  . Neuropathy   . Osteoporosis 04/14/2017                              Surgical Hx:    Past Surgical History:  Procedure Laterality Date  . APPENDECTOMY    . BILATERAL CARPAL TUNNEL RELEASE Right   . BREAST SURGERY    . BUNIONECTOMY Bilateral 1968  . CATARACT EXTRACTION W/PHACO Right 08/30/2017   Procedure: CATARACT EXTRACTION PHACO AND INTRAOCULAR LENS PLACEMENT RIGHT EYE;  Surgeon: [name], MD;  Location: [facility];  Service: Ophthalmology;  Laterality: Right;  CDE: 13.63  . CATARACT EXTRACTION W/PHACO Left 09/20/2017   Procedure: CATARACT EXTRACTION PHACO AND INTRAOCULAR LENS PLACEMENT LEFT EYE;  Surgeon: [name], MD;  Location: [facility];  Service: Ophthalmology;  Laterality: Left;  CDE: 11.25  . 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 FOOT SURGERY Right   . FRACTURE SURGERY Left    knee  fracture  . HERNIA REPAIR     ventral   Medications: Reviewed & Updated - see associated section                       Current Outpatient Medications:  .  gabapentin (NEURONTIN) 400 MG capsule, Take 2 capsules (800 mg total) by mouth 4 (four) times daily. BREAKFAST, LUNCH, DINNER, & BEDTIME., Disp: 720 capsule, Rfl: 0 .  metFORMIN (GLUCOPHAGE) 500 MG tablet, Take 1 tablet (500 mg total) by mouth daily after supper., Disp: 90 tablet, Rfl: 0 .  Methylsulfonylmethane (MSM) 1000 MG TABS, Take 3 tablets by mouth daily. JOINT, Disp: , Rfl:  .  Multiple Vitamin (MULTIVITAMIN WITH MINERALS) TABS tablet, Take 1 tablet by mouth daily., Disp: , Rfl:  .  protein supplement shake (PREMIER PROTEIN) LIQD, Take 11 oz by mouth as needed. , Disp: , Rfl:  .  Turmeric, Curcuma Longa, (CURCUMIN) POWD, Take 1 capsule by mouth daily. , Disp: , Rfl:  .  UNABLE TO FIND, Diabetic shoes x 1 inserts x 3  dx, Disp: 1 each, Rfl: 0   Social History: Reviewed -  reports that she quit smoking about 54 years ago. Her smoking  use included cigarettes. She started smoking about 67 years ago. She has a 12.00 pack-year smoking history. She has never used smokeless tobacco.  Objective Findings:  Vitals: Blood pressure 136/76, pulse 75, height 5\' 3"  (1.6 m), weight 182 lb 6.4 oz (82.7 kg).  PHYSICAL EXAMINATION General appearance - alert, well appearing, and in no distress, oriented to person, place, and time, overweight and chronically ill appearing Mental status - normal mood, behavior, speech, dress, motor activity, and thought processes Chest -  Heart -  Abdomen - soft, nontender, nondistended, no masses or organomegaly Breasts -  Skin -vulvar laxity  PELVIC External genitalia -relaxed introitus allows 3 3 fingerbreadths lateral diameter Vulva -postmenopausal atrophic good posterior support site of old episiotomy and repair Vagina -bulging cystocele through introitus Cervix -moderate descensus grade 2  Uterus -mobile  anteflexed ultrasound is reviewed  Adnexa -no tenderness or masses [place] -  Rectal - rectal exam not indicated PESSARY FITTING: The patient presented today for a pessary fitting. She reports no vaginal bleeding or discharge. She denies pelvic discomfort and difficulty urinating or moving her bowels. She does not having urinary urges and frequency. ?The patient was fitted for a 2.5 cm pessary. Speculum examination revealed normal vaginal mucosa with no lesions or lacerations. The patient should return in 3 months for a pessary check..   Patient hasn't had anymore postmenopausal bleeding. She had a question about whether the pessary will help with urinary urges and frequency.    Assessment & Plan:   A:  1. Fitted with 2.5 cm Gellhorn pessary with drain, which is ordered to [facility] patient instructed on purchase and will return with this in 1 week 2. Severe deconditioning due to recent foot injury, patient instructed in leg exercises and squat exercises  P:  1. Pessary insertion 1 week.  Patient unlikely to be able execute self insertion and removal 2. As above      A: 1. Lax introitus with cystocele that protrudes past the hymen with grade 3 cystocele.  2. Cervix in 2 cm introitus, posterior support is adequate.  P: 1. F/u 1 week pessary insertion 2. F/u in 3 months for pessary check   By signing my name below, I, Samul Dada, attest that this documentation has been prepared under the direction and in the presence of Jonnie Kind, MD. Electronically Signed: [place]. 11/15/17. 1:45 PM.  I personally performed the services described in this documentation, which was SCRIBED in my presence. The recorded information has been reviewed and considered accurate. It has been edited as necessary during review. Jonnie Kind, MD

## 2017-11-16 ENCOUNTER — Telehealth: Payer: Self-pay | Admitting: Obstetrics and Gynecology

## 2017-11-16 NOTE — Telephone Encounter (Signed)
Pt has been informed by L.Cresenzo to go to [facility], [facility] side, for Pessary

## 2017-11-16 NOTE — Telephone Encounter (Signed)
Pt seen F[place] yesterday and pessary was to be called into [facility], and she called and they do not have script can you check on that for her

## 2017-11-16 NOTE — Telephone Encounter (Signed)
Spoke to someone at [facility] who spoke with Dr Glo Herring personally and received order for pessary.  Pt notified and informed they did have the order but she needed to go to the home medical equipment side in order to get it.  Pt verbalized understanding.

## 2017-11-22 ENCOUNTER — Ambulatory Visit (INDEPENDENT_AMBULATORY_CARE_PROVIDER_SITE_OTHER): Payer: Medicare Other | Admitting: Otolaryngology

## 2017-11-22 DIAGNOSIS — H6121 Impacted cerumen, right ear: Secondary | ICD-10-CM

## 2017-11-22 DIAGNOSIS — J310 Chronic rhinitis: Secondary | ICD-10-CM | POA: Diagnosis not present

## 2017-11-22 DIAGNOSIS — J343 Hypertrophy of nasal turbinates: Secondary | ICD-10-CM | POA: Diagnosis not present

## 2017-11-22 DIAGNOSIS — J342 Deviated nasal septum: Secondary | ICD-10-CM

## 2017-11-29 ENCOUNTER — Other Ambulatory Visit: Payer: Self-pay | Admitting: Otolaryngology

## 2017-11-30 DIAGNOSIS — B351 Tinea unguium: Secondary | ICD-10-CM | POA: Diagnosis not present

## 2017-11-30 DIAGNOSIS — L84 Corns and callosities: Secondary | ICD-10-CM | POA: Diagnosis not present

## 2017-11-30 DIAGNOSIS — M79671 Pain in right foot: Secondary | ICD-10-CM | POA: Diagnosis not present

## 2017-11-30 DIAGNOSIS — L97512 Non-pressure chronic ulcer of other part of right foot with fat layer exposed: Secondary | ICD-10-CM | POA: Diagnosis not present

## 2017-11-30 DIAGNOSIS — M79672 Pain in left foot: Secondary | ICD-10-CM | POA: Diagnosis not present

## 2017-11-30 DIAGNOSIS — E1151 Type 2 diabetes mellitus with diabetic peripheral angiopathy without gangrene: Secondary | ICD-10-CM | POA: Diagnosis not present

## 2017-11-30 DIAGNOSIS — E11621 Type 2 diabetes mellitus with foot ulcer: Secondary | ICD-10-CM | POA: Diagnosis not present

## 2017-12-01 ENCOUNTER — Encounter: Payer: Self-pay | Admitting: Obstetrics and Gynecology

## 2017-12-01 ENCOUNTER — Ambulatory Visit (INDEPENDENT_AMBULATORY_CARE_PROVIDER_SITE_OTHER): Payer: Medicare Other | Admitting: Obstetrics and Gynecology

## 2017-12-01 VITALS — BP 126/68 | HR 70 | Wt 183.0 lb

## 2017-12-01 DIAGNOSIS — N8189 Other female genital prolapse: Secondary | ICD-10-CM | POA: Diagnosis not present

## 2017-12-01 NOTE — Progress Notes (Signed)
Patient ID: Jessica Russell, female   DOB: 08-12-1927, 84 y.o.   MRN: [id number]  GYNECOLOGY CLINIC PROGRESS NOTE She had light bleeding 11/29/17 on her pantyliner but has not had an bleeding since.  Chart reviewed, she had recent TV u/s pelvis showing a thin endometrial stripe, 2.8 mm..  The patient presented today for a pessary insert. She reports no vaginal bleeding today or discharge. She denies pelvic discomfort and difficulty urinating or moving her bowels. Speculum exam: no lesions, no blood, + cystocele ?The patient's number 4 Gellhorn 2.50 in pessary was inserted and placed without complications. Speculum examination revealed normal vaginal mucosa with no lesions or lacerations. The patient should return in 1 month for a pessary check.  By signing my name below, I, Samul Dada, attest that this documentation has been prepared under the direction and in the presence of Jonnie Kind, MD. Electronically Signed: [place]. 12/01/17. 12:08 PM.  I personally performed the services described in this documentation, which was SCRIBED in my presence. The recorded information has been reviewed and considered accurate. It has been edited as necessary during review. Jonnie Kind, MD

## 2017-12-14 DIAGNOSIS — M21611 Bunion of right foot: Secondary | ICD-10-CM | POA: Diagnosis not present

## 2017-12-14 DIAGNOSIS — E11621 Type 2 diabetes mellitus with foot ulcer: Secondary | ICD-10-CM | POA: Diagnosis not present

## 2017-12-14 DIAGNOSIS — L97512 Non-pressure chronic ulcer of other part of right foot with fat layer exposed: Secondary | ICD-10-CM | POA: Diagnosis not present

## 2017-12-14 DIAGNOSIS — E1151 Type 2 diabetes mellitus with diabetic peripheral angiopathy without gangrene: Secondary | ICD-10-CM | POA: Diagnosis not present

## 2017-12-20 DIAGNOSIS — M545 Low back pain: Secondary | ICD-10-CM | POA: Diagnosis not present

## 2017-12-20 DIAGNOSIS — M9905 Segmental and somatic dysfunction of pelvic region: Secondary | ICD-10-CM | POA: Diagnosis not present

## 2017-12-23 DIAGNOSIS — M545 Low back pain: Secondary | ICD-10-CM | POA: Diagnosis not present

## 2017-12-23 DIAGNOSIS — M9905 Segmental and somatic dysfunction of pelvic region: Secondary | ICD-10-CM | POA: Diagnosis not present

## 2017-12-28 ENCOUNTER — Encounter (HOSPITAL_BASED_OUTPATIENT_CLINIC_OR_DEPARTMENT_OTHER): Payer: Self-pay | Admitting: *Deleted

## 2017-12-28 ENCOUNTER — Other Ambulatory Visit: Payer: Self-pay

## 2017-12-29 ENCOUNTER — Encounter: Payer: Self-pay | Admitting: Obstetrics and Gynecology

## 2017-12-29 ENCOUNTER — Ambulatory Visit (INDEPENDENT_AMBULATORY_CARE_PROVIDER_SITE_OTHER): Payer: Medicare Other | Admitting: Obstetrics and Gynecology

## 2017-12-29 VITALS — BP 155/70 | HR 72 | Ht 63.0 in | Wt 180.0 lb

## 2017-12-29 DIAGNOSIS — Z4689 Encounter for fitting and adjustment of other specified devices: Secondary | ICD-10-CM

## 2017-12-29 NOTE — Progress Notes (Signed)
Patient ID: Jessica Russell, female   DOB: 11-16-1927, 84 y.o.   MRN: [id number]  .GYNECOLOGY CLINIC PROGRESS NOTE [name] doesn't notice pessary. She no longer has odorous urine. She believes she needs to see a gastrologist, due to having explosive bowel movements after eating, she can make it to the restroom but has to get there quickly after eating.  The patient presented today for a pessary check. She specifically denies no vaginal bleeding or discharge. She denies pelvic discomfort and difficulty urinating or moving her bowels but has a reoccurrence of incomplete voiding and has to sit a while before finishes voiding. ?The patient's number 4 Gellhorn 2.50  pessary was inspected but not removed, cleaned and replaced. Speculum examination revealed normal vaginal mucosa with no lesions or lacerations. The patient should return in 6 months for a pessary check and continue to use vaginal estrogen cream weekly as prescribed.  By signing my name below, I, Samul Dada, attest that this documentation has been prepared under the direction and in the presence of Jonnie Kind, MD. Electronically Signed: [place]. 12/29/17. 11:58 AM.  I personally performed the services described in this documentation, which was SCRIBED in my presence. The recorded information has been reviewed and considered accurate. It has been edited as necessary during review. Jonnie Kind, MD

## 2017-12-31 DIAGNOSIS — M21611 Bunion of right foot: Secondary | ICD-10-CM | POA: Diagnosis not present

## 2017-12-31 DIAGNOSIS — L97512 Non-pressure chronic ulcer of other part of right foot with fat layer exposed: Secondary | ICD-10-CM | POA: Diagnosis not present

## 2018-01-04 ENCOUNTER — Ambulatory Visit (HOSPITAL_BASED_OUTPATIENT_CLINIC_OR_DEPARTMENT_OTHER)
Admission: RE | Admit: 2018-01-04 | Discharge: 2018-01-04 | Disposition: A | Discharge status: Home / Self Care | Payer: Medicare Other | Source: Ambulatory Visit | Attending: Otolaryngology | Admitting: Otolaryngology

## 2018-01-04 ENCOUNTER — Ambulatory Visit (HOSPITAL_BASED_OUTPATIENT_CLINIC_OR_DEPARTMENT_OTHER): Payer: Medicare Other | Admitting: Certified Registered"

## 2018-01-04 ENCOUNTER — Encounter (HOSPITAL_BASED_OUTPATIENT_CLINIC_OR_DEPARTMENT_OTHER)
Admission: RE | Disposition: A | Discharge status: Home / Self Care | Payer: Self-pay | Source: Ambulatory Visit | Attending: Otolaryngology

## 2018-01-04 ENCOUNTER — Encounter (HOSPITAL_BASED_OUTPATIENT_CLINIC_OR_DEPARTMENT_OTHER): Payer: Self-pay

## 2018-01-04 ENCOUNTER — Other Ambulatory Visit: Payer: Self-pay

## 2018-01-04 DIAGNOSIS — H6121 Impacted cerumen, right ear: Secondary | ICD-10-CM | POA: Diagnosis not present

## 2018-01-04 DIAGNOSIS — Z87891 Personal history of nicotine dependence: Secondary | ICD-10-CM | POA: Diagnosis not present

## 2018-01-04 DIAGNOSIS — J3489 Other specified disorders of nose and nasal sinuses: Secondary | ICD-10-CM | POA: Insufficient documentation

## 2018-01-04 DIAGNOSIS — E119 Type 2 diabetes mellitus without complications: Secondary | ICD-10-CM | POA: Insufficient documentation

## 2018-01-04 DIAGNOSIS — Z7984 Long term (current) use of oral hypoglycemic drugs: Secondary | ICD-10-CM | POA: Diagnosis not present

## 2018-01-04 DIAGNOSIS — J343 Hypertrophy of nasal turbinates: Secondary | ICD-10-CM | POA: Insufficient documentation

## 2018-01-04 DIAGNOSIS — J342 Deviated nasal septum: Secondary | ICD-10-CM | POA: Diagnosis not present

## 2018-01-04 HISTORY — PX: NASAL SEPTOPLASTY W/ TURBINOPLASTY: SHX2070

## 2018-01-04 LAB — GLUCOSE, CAPILLARY: GLUCOSE-CAPILLARY: 150 mg/dL — AB (ref 70–99)

## 2018-01-04 LAB — POCT I-STAT, CHEM 8
BUN: 14 mg/dL (ref 8–23)
CALCIUM ION: 1.13 mmol/L — AB (ref 1.15–1.40)
Chloride: 107 mmol/L (ref 98–111)
Creatinine, Ser: 0.5 mg/dL (ref 0.44–1.00)
Glucose, Bld: 111 mg/dL — ABNORMAL HIGH (ref 70–99)
HCT: 42 % (ref 36.0–46.0)
Hemoglobin: 14.3 g/dL (ref 12.0–15.0)
Potassium: 4 mmol/L (ref 3.5–5.1)
SODIUM: 140 mmol/L (ref 135–145)
TCO2: 21 mmol/L — AB (ref 22–32)

## 2018-01-04 SURGERY — SEPTOPLASTY, NOSE, WITH NASAL TURBINATE REDUCTION
Anesthesia: General | Site: Nose | Laterality: Bilateral

## 2018-01-04 MED ORDER — OXYMETAZOLINE HCL 0.05 % NA SOLN
NASAL | Status: DC | PRN
  Administered 2018-01-04: 1 via TOPICAL

## 2018-01-04 MED ORDER — HYDROMORPHONE HCL 1 MG/ML IJ SOLN
0.2500 mg | INTRAMUSCULAR | Status: DC | PRN
  Administered 2018-01-04 (×2): 0.5 mg via INTRAVENOUS

## 2018-01-04 MED ORDER — MUPIROCIN 2 % EX OINT
TOPICAL_OINTMENT | CUTANEOUS | Status: DC | PRN
  Administered 2018-01-04: 1 via TOPICAL

## 2018-01-04 MED ORDER — OXYCODONE-ACETAMINOPHEN 5-325 MG PO TABS
ORAL_TABLET | ORAL | Status: AC
  Filled 2018-01-04: qty 1

## 2018-01-04 MED ORDER — EPHEDRINE SULFATE 50 MG/ML IJ SOLN
INTRAMUSCULAR | Status: DC | PRN
  Administered 2018-01-04 (×3): 10 mg via INTRAVENOUS

## 2018-01-04 MED ORDER — ONDANSETRON HCL 4 MG/2ML IJ SOLN
INTRAMUSCULAR | Status: AC
  Filled 2018-01-04: qty 2

## 2018-01-04 MED ORDER — DEXAMETHASONE SODIUM PHOSPHATE 10 MG/ML IJ SOLN
INTRAMUSCULAR | Status: AC
  Filled 2018-01-04: qty 1

## 2018-01-04 MED ORDER — HYDROMORPHONE HCL 1 MG/ML IJ SOLN
INTRAMUSCULAR | Status: AC
  Filled 2018-01-04: qty 0.5

## 2018-01-04 MED ORDER — LIDOCAINE 2% (20 MG/ML) 5 ML SYRINGE
INTRAMUSCULAR | Status: AC
  Filled 2018-01-04: qty 5

## 2018-01-04 MED ORDER — ROCURONIUM BROMIDE 100 MG/10ML IV SOLN
INTRAVENOUS | Status: DC | PRN
  Administered 2018-01-04: 50 mg via INTRAVENOUS

## 2018-01-04 MED ORDER — FENTANYL CITRATE (PF) 100 MCG/2ML IJ SOLN
50.0000 ug | INTRAMUSCULAR | Status: DC | PRN
  Administered 2018-01-04: 50 ug via INTRAVENOUS
  Administered 2018-01-04: 100 ug via INTRAVENOUS

## 2018-01-04 MED ORDER — ROCURONIUM BROMIDE 50 MG/5ML IV SOSY
PREFILLED_SYRINGE | INTRAVENOUS | Status: AC
  Filled 2018-01-04: qty 5

## 2018-01-04 MED ORDER — CEFAZOLIN SODIUM-DEXTROSE 1-4 GM/50ML-% IV SOLN
INTRAVENOUS | Status: DC | PRN
  Administered 2018-01-04: 2 g via INTRAVENOUS

## 2018-01-04 MED ORDER — MIDAZOLAM HCL 2 MG/2ML IJ SOLN: 1.0000 mg | INTRAMUSCULAR | Status: DC | PRN

## 2018-01-04 MED ORDER — SUGAMMADEX SODIUM 200 MG/2ML IV SOLN
INTRAVENOUS | Status: DC | PRN
  Administered 2018-01-04: 200 mg via INTRAVENOUS

## 2018-01-04 MED ORDER — MEPERIDINE HCL 25 MG/ML IJ SOLN: 6.2500 mg | INTRAMUSCULAR | Status: DC | PRN

## 2018-01-04 MED ORDER — LABETALOL HCL 5 MG/ML IV SOLN
INTRAVENOUS | Status: DC | PRN
  Administered 2018-01-04: 5 mg via INTRAVENOUS

## 2018-01-04 MED ORDER — LACTATED RINGERS IV SOLN
INTRAVENOUS | Status: DC
  Administered 2018-01-04 (×2): via INTRAVENOUS

## 2018-01-04 MED ORDER — ONDANSETRON HCL 4 MG/2ML IJ SOLN
INTRAMUSCULAR | Status: DC | PRN
  Administered 2018-01-04: 4 mg via INTRAVENOUS

## 2018-01-04 MED ORDER — LIDOCAINE HCL (CARDIAC) PF 100 MG/5ML IV SOSY
PREFILLED_SYRINGE | INTRAVENOUS | Status: DC | PRN
  Administered 2018-01-04: 100 mg via INTRAVENOUS

## 2018-01-04 MED ORDER — ONDANSETRON HCL 4 MG/2ML IJ SOLN: 4.0000 mg | Freq: Once | INTRAMUSCULAR | Status: DC | PRN

## 2018-01-04 MED ORDER — PROPOFOL 10 MG/ML IV BOLUS
INTRAVENOUS | Status: DC | PRN
  Administered 2018-01-04: 120 mg via INTRAVENOUS

## 2018-01-04 MED ORDER — LIDOCAINE-EPINEPHRINE 1 %-1:100000 IJ SOLN
INTRAMUSCULAR | Status: DC | PRN
  Administered 2018-01-04: 2 mL

## 2018-01-04 MED ORDER — PHENYLEPHRINE HCL 10 MG/ML IJ SOLN
INTRAMUSCULAR | Status: DC | PRN
  Administered 2018-01-04 (×2): 80 ug via INTRAVENOUS

## 2018-01-04 MED ORDER — SCOPOLAMINE 1 MG/3DAYS TD PT72: 1.0000 | MEDICATED_PATCH | Freq: Once | TRANSDERMAL | Status: DC | PRN

## 2018-01-04 MED ORDER — FENTANYL CITRATE (PF) 100 MCG/2ML IJ SOLN
INTRAMUSCULAR | Status: AC
  Filled 2018-01-04: qty 2

## 2018-01-04 MED ORDER — DEXAMETHASONE SODIUM PHOSPHATE 4 MG/ML IJ SOLN
INTRAMUSCULAR | Status: DC | PRN
  Administered 2018-01-04: 10 mg via INTRAVENOUS

## 2018-01-04 MED ORDER — OXYCODONE-ACETAMINOPHEN 5-325 MG PO TABS: 1.0000 | ORAL_TABLET | ORAL | 0 refills | Status: DC | PRN

## 2018-01-04 MED ORDER — OXYCODONE-ACETAMINOPHEN 5-325 MG PO TABS
1.0000 | ORAL_TABLET | Freq: Once | ORAL | Status: AC
  Administered 2018-01-04: 1 via ORAL

## 2018-01-04 SURGICAL SUPPLY — 30 items
ATTRACTOMAT 16X20 MAGNETIC DRP (DRAPES) IMPLANT
CANISTER SUCT 1200ML W/VALVE (MISCELLANEOUS) ×2 IMPLANT
COAGULATOR SUCT 8FR VV (MISCELLANEOUS) ×2 IMPLANT
DECANTER SPIKE VIAL GLASS SM (MISCELLANEOUS) IMPLANT
DRSG NASOPORE 8CM (GAUZE/BANDAGES/DRESSINGS) IMPLANT
DRSG TELFA 3X8 NADH (GAUZE/BANDAGES/DRESSINGS) IMPLANT
ELECT REM PT RETURN 9FT ADLT (ELECTROSURGICAL) ×2
ELECTRODE REM PT RTRN 9FT ADLT (ELECTROSURGICAL) ×1 IMPLANT
GLOVE BIO SURGEON STRL SZ7 (GLOVE) ×2 IMPLANT
GLOVE BIO SURGEON STRL SZ7.5 (GLOVE) ×2 IMPLANT
GOWN STRL REUS W/ TWL LRG LVL3 (GOWN DISPOSABLE) ×2 IMPLANT
GOWN STRL REUS W/TWL LRG LVL3 (GOWN DISPOSABLE) ×2
NEEDLE HYPO 25X1 1.5 SAFETY (NEEDLE) ×2 IMPLANT
NS IRRIG 1000ML POUR BTL (IV SOLUTION) ×2 IMPLANT
PACK BASIN DAY SURGERY FS (CUSTOM PROCEDURE TRAY) ×2 IMPLANT
PACK ENT DAY SURGERY (CUSTOM PROCEDURE TRAY) ×2 IMPLANT
SLEEVE SCD COMPRESS KNEE MED (MISCELLANEOUS) ×2 IMPLANT
SOLUTION BUTLER CLEAR DIP (MISCELLANEOUS) ×2 IMPLANT
SPLINT NASAL AIRWAY SILICONE (MISCELLANEOUS) ×2 IMPLANT
SPONGE GAUZE 2X2 8PLY STRL LF (GAUZE/BANDAGES/DRESSINGS) ×2 IMPLANT
SPONGE NEURO XRAY DETECT 1X3 (DISPOSABLE) ×2 IMPLANT
SUT CHROMIC 4 0 P 3 18 (SUTURE) ×2 IMPLANT
SUT PLAIN 4 0 ~~LOC~~ 1 (SUTURE) ×2 IMPLANT
SUT PROLENE 3 0 PS 2 (SUTURE) ×2 IMPLANT
SUT VIC AB 4-0 P-3 18XBRD (SUTURE) IMPLANT
SUT VIC AB 4-0 P3 18 (SUTURE)
TOWEL GREEN STERILE FF (TOWEL DISPOSABLE) ×2 IMPLANT
TUBE SALEM SUMP 12R W/ARV (TUBING) IMPLANT
TUBE SALEM SUMP 16 FR W/ARV (TUBING) ×2 IMPLANT
YANKAUER SUCT BULB TIP NO VENT (SUCTIONS) ×2 IMPLANT

## 2018-01-04 NOTE — Transfer of Care (Signed)
Immediate Anesthesia Transfer of Care Note  Patient: Jessica Russell  Procedure(s) Performed: NASAL SEPTOPLASTY WITH BILATERAL TURBINATE REDUCTION (Bilateral Nose)  Patient Location: PACU  Anesthesia Type:General  Level of Consciousness: awake, alert  and oriented  Airway & Oxygen Therapy: Patient Spontanous Breathing and Patient connected to face mask oxygen  Post-op Assessment: Report given to RN and Post -op Vital signs reviewed and stable  Post vital signs: Reviewed and stable  Last Vitals:  Vitals Value Taken Time  BP    Temp    Pulse 78 01/04/2018  9:54 AM  Resp 13 01/04/2018  9:54 AM  SpO2 99 % 01/04/2018  9:54 AM  Vitals shown include unvalidated device data.  Last Pain:  Vitals:   01/04/18 0754  TempSrc: Oral  PainSc: 2       Patients Stated Pain Goal: 2 (26/33/35 4562)  Complications: No apparent anesthesia complications

## 2018-01-04 NOTE — Anesthesia Postprocedure Evaluation (Signed)
Anesthesia Post Note  Patient: Jessica Russell  Procedure(s) Performed: NASAL SEPTOPLASTY WITH BILATERAL TURBINATE REDUCTION (Bilateral Nose)     Patient location during evaluation: PACU Anesthesia Type: General Level of consciousness: awake and alert Pain management: pain level controlled Vital Signs Assessment: post-procedure vital signs reviewed and stable Respiratory status: spontaneous breathing, nonlabored ventilation, respiratory function stable and patient connected to nasal cannula oxygen Cardiovascular status: blood pressure returned to baseline and stable Postop Assessment: no apparent nausea or vomiting Anesthetic complications: no    Last Vitals:  Vitals:   01/04/18 1100 01/04/18 1145  BP: (!) 154/69 (!) 154/83  Pulse: 79 71  Resp: 15 16  Temp:  (!) 35.9 C  SpO2: 98% 98%    Last Pain:  Vitals:   01/04/18 1145  TempSrc:   PainSc: 3                  Jemma Rasp DAVID

## 2018-01-04 NOTE — Anesthesia Procedure Notes (Signed)
Procedure Name: Intubation Performed by: Verita Lamb, CRNA Pre-anesthesia Checklist: Patient identified, Suction available, Patient being monitored and Timeout performed Patient Re-evaluated:Patient Re-evaluated prior to induction Oxygen Delivery Method: Circle system utilized Preoxygenation: Pre-oxygenation with 100% oxygen Induction Type: IV induction Ventilation: Mask ventilation without difficulty Laryngoscope Size: Mac and 4 Grade View: Grade I Tube type: Oral Tube size: 7.0 mm Number of attempts: 1 Airway Equipment and Method: Stylet Placement Confirmation: ETT inserted through vocal cords under direct vision,  CO2 detector,  positive ETCO2 and breath sounds checked- equal and bilateral Secured at: 21 cm Tube secured with: Tape Dental Injury: Teeth and Oropharynx as per pre-operative assessment

## 2018-01-04 NOTE — CV Procedure (Signed)
DATE OF PROCEDURE: 01/04/2018  OPERATIVE REPORT   SURGEON: [name], MD   PREOPERATIVE DIAGNOSES:  1. Severe nasal septal deviation.  2. Bilateral inferior turbinate hypertrophy.  3. Chronic nasal obstruction.  POSTOPERATIVE DIAGNOSES:  1. Severe nasal septal deviation.  2. Bilateral inferior turbinate hypertrophy.  3. Chronic nasal obstruction.  PROCEDURE PERFORMED:  1. Septoplasty.  2. Bilateral partial inferior turbinate resection.   ANESTHESIA: General endotracheal tube anesthesia.   COMPLICATIONS: None.   ESTIMATED BLOOD LOSS: 150 mL.   INDICATION FOR PROCEDURE: Jessica Russell is a 84 y.o. female with a history of chronic nasal obstruction. The patient was  treated with antihistamine, decongestant, and steroid nasal spray. However, the patient continued to be symptomatic. On examination, the patient was noted to have bilateral severe inferior turbinate hypertrophy and significant nasal septal deviation, causing significant nasal obstruction. Based on the above findings, the decision was made for the patient to undergo the above-stated procedures. The risks, benefits, alternatives, and details of the procedures were discussed with the patient. Questions were invited and answered. Informed consent was obtained.   DESCRIPTION OF PROCEDURE: The patient was taken to the operating room and placed supine on the operating table. General endotracheal tube anesthesia was administered by the anesthesiologist. The patient was positioned, and prepped and draped in the standard fashion for nasal surgery. Pledgets soaked with Afrin were placed in both nasal cavities for decongestion. The pledgets were subsequently removed. The above mentioned severe septal deviation was again noted. 1% lidocaine with 1:100,000 epinephrine was injected onto the nasal septum bilaterally. A hemitransfixion incision was made on the left side. The mucosal flap was carefully elevated on the left side. A cartilaginous  incision was made 1 cm superior to the caudal margin of the nasal septum. Mucosal flap was also elevated on the right side in the similar fashion. It should be noted that due to the severe septal deviation, the deviated portion of the cartilaginous and bony septum had to be removed in piecemeal fashion. Once the deviated portions were removed, a straight midline septum was achieved. The septum was then quilted with 4-0 plain gut sutures. The hemitransfixion incision was closed with interrupted 4-0 chromic sutures. Doyle splints were applied.   Prior to the [facility] splint application, the inferior one half of both hypertrophied inferior turbinate was crossclamped with a Kelly clamp. The inferior one half of each inferior turbinate was then resected with a pair of cross cutting scissors. Hemostasis was achieved with a suction cautery device.   The care of the patient was turned over to the anesthesiologist. The patient was awakened from anesthesia without difficulty. The patient was extubated and transferred to the recovery room in good condition.   OPERATIVE FINDINGS: Severe nasal septal deviation and bilateral inferior turbinate hypertrophy.   SPECIMEN: None.   FOLLOWUP CARE: The patient be discharged home once she is awake and alert. The patient will be placed on Percocet 2 tablets p.o. q.4 hours p.r.n. pain, and amoxicillin 875 mg p.o. b.i.d. for 5 days. The patient will follow up in my office in approximately 1 week for splint removal.   Lason Eveland Raynelle Bring, MD

## 2018-01-04 NOTE — Anesthesia Preprocedure Evaluation (Signed)
Anesthesia Evaluation  Patient identified by MRN, date of birth, ID band Patient awake    Reviewed: Allergy & Precautions, NPO status , Patient's Chart, lab work & pertinent test results  Airway Mallampati: I  TM Distance: >3 FB Neck ROM: Full    Dental   Pulmonary former smoker,    Pulmonary exam normal        Cardiovascular Normal cardiovascular exam     Neuro/Psych    GI/Hepatic   Endo/Other  diabetes, Type 2, Oral Hypoglycemic Agents  Renal/GU      Musculoskeletal   Abdominal   Peds  Hematology   Anesthesia Other Findings   Reproductive/Obstetrics                             Anesthesia Physical Anesthesia Plan  ASA: III  Anesthesia Plan: General   Post-op Pain Management:    Induction: Intravenous  PONV Risk Score and Plan: 3 and Ondansetron, Midazolam and Treatment may vary due to age or medical condition  Airway Management Planned: Oral ETT  Additional Equipment:   Intra-op Plan:   Post-operative Plan: Extubation in OR  Informed Consent: I have reviewed the patients History and Physical, chart, labs and discussed the procedure including the risks, benefits and alternatives for the proposed anesthesia with the patient or authorized representative who has indicated his/her understanding and acceptance.     Plan Discussed with: CRNA and Surgeon  Anesthesia Plan Comments:         Anesthesia Quick Evaluation

## 2018-01-04 NOTE — Discharge Instructions (Addendum)

## 2018-01-04 NOTE — H&P (Signed)
Cc: Persistent nasal obstruction  HPI: The patient is an 84 year old female who presents today complaining of right ear cerumen impaction and persistent nasal obstruction.  The patient was last seen 6 months ago.  At that time, she was noted to have severe bidirectional nasal septal deviation and bilateral inferior turbinate hypertrophy.  She was treated with Flonase nasal spray.  The option of septoplasty and turbinate reduction was discussed.  The patient returns today complaining of persistent nasal obstruction. She is interested in proceeding with the surgical intervention.  In addition, the patient was recently noted to have right ear cerumen impaction.  Attempts to remove the cerumen impaction were unsuccessful.  The patient complains of persistent clogging sensation in the right ear. No other ENT, GI, or respiratory issue noted since the last visit.   Exam: General: Communicates without difficulty, well nourished, no acute distress. Head: Normocephalic, no evidence injury, no tenderness, facial buttresses intact without stepoff. Eyes: PERRL, EOMI. No scleral icterus, conjunctivae clear. Neuro: CN II exam reveals vision grossly intact.  No nystagmus at any point of gaze. Ears: Right ear cerumen impaction. Nose: External evaluation reveals normal support and skin without lesions.  Dorsum is intact.  Anterior rhinoscopy reveals congested and edematous mucosa over anterior aspect of the inferior turbinates and deviated nasal septum. Oral:  Oral cavity and oropharynx are intact, symmetric, without erythema or edema.  Mucosa is moist without lesions. Neck: Full range of motion without pain.  There is no significant lymphadenopathy.  No masses palpable.  Thyroid bed within normal limits to palpation.  Parotid glands and submandibular glands equal bilaterally without mass.  Trachea is midline. Neuro:  CN 2-12 grossly intact. Gait normal. Vestibular: No nystagmus at any point of gaze.   Assessment  1.  Right  ear cerumen impaction.  After the disimpaction procedure, her tympanic membranes and middle ear spaces are all normal.  2.  Chronic nasal obstruction, secondary to severe nasal septal deviation and bilateral inferior turbinate hypertrophy.  The patient has not responded to conservative medical treatment so far.   Plan: 1.  Otomicroscopy with right ear cerumen disimpaction.  2.  The physical exam findings are reviewed with the patient.  3.  Based on the history above, the patient is a candidate to undergo septoplasty and turbinate reduction to improve her nasal passageways.  The risks, benefits, and details of the procedure are extensively discussed.  Questions are invited and answered.  4.  The patient would like to proceed with the procedures.

## 2018-01-05 ENCOUNTER — Ambulatory Visit: Payer: Medicare Other | Admitting: Family Medicine

## 2018-01-05 ENCOUNTER — Encounter (HOSPITAL_BASED_OUTPATIENT_CLINIC_OR_DEPARTMENT_OTHER): Payer: Self-pay | Admitting: Otolaryngology

## 2018-01-06 ENCOUNTER — Ambulatory Visit (INDEPENDENT_AMBULATORY_CARE_PROVIDER_SITE_OTHER): Payer: Medicare Other | Admitting: Otolaryngology

## 2018-01-08 ENCOUNTER — Other Ambulatory Visit: Payer: Self-pay | Admitting: Family Medicine

## 2018-01-08 DIAGNOSIS — E1142 Type 2 diabetes mellitus with diabetic polyneuropathy: Secondary | ICD-10-CM

## 2018-01-13 ENCOUNTER — Ambulatory Visit (INDEPENDENT_AMBULATORY_CARE_PROVIDER_SITE_OTHER): Payer: Medicare Other | Admitting: Neurology

## 2018-01-13 ENCOUNTER — Encounter: Payer: Self-pay | Admitting: Neurology

## 2018-01-13 VITALS — BP 119/70 | HR 61 | Ht 63.0 in | Wt 181.0 lb

## 2018-01-13 DIAGNOSIS — E1142 Type 2 diabetes mellitus with diabetic polyneuropathy: Secondary | ICD-10-CM

## 2018-01-13 DIAGNOSIS — E538 Deficiency of other specified B group vitamins: Secondary | ICD-10-CM | POA: Diagnosis not present

## 2018-01-13 DIAGNOSIS — G629 Polyneuropathy, unspecified: Secondary | ICD-10-CM | POA: Diagnosis not present

## 2018-01-13 DIAGNOSIS — E1140 Type 2 diabetes mellitus with diabetic neuropathy, unspecified: Secondary | ICD-10-CM | POA: Diagnosis not present

## 2018-01-13 DIAGNOSIS — E1121 Type 2 diabetes mellitus with diabetic nephropathy: Secondary | ICD-10-CM

## 2018-01-13 DIAGNOSIS — R899 Unspecified abnormal finding in specimens from other organs, systems and tissues: Secondary | ICD-10-CM | POA: Diagnosis not present

## 2018-01-13 MED ORDER — DULOXETINE HCL 30 MG PO CPEP: ORAL_CAPSULE | ORAL | 3 refills | Status: DC

## 2018-01-13 NOTE — Progress Notes (Signed)
Reason for visit: Peripheral neuropathy  Referring physician: Dr. Joanne Chars Russell is a 84 y.o. female  History of present illness:  Jessica Russell is an 84 year old right-handed white female with a history of diabetes diagnosed 3 to 4 years ago.  The patient has had neuropathy pain for about 30 years.  The pain now involves all 4 extremities including the hands and feet.  The patient has sensory alteration up to the knees bilaterally.  The patient has had a gradual worsening in the severity the pain over the years, she is no longer sleeping well at night.  She has stopped operating a motor vehicle.  She uses a cane for ambulation.  She has not had any recent falls.  The patient has some cramping of the fingers.  She has been to multiple neurology doctors for evaluation, she was evaluated previously for a spinal stimulator but this was never done.  She has tried topical lidocaine, laser surgery, and TENS units without much benefit.  The patient has been on amitriptyline, Lyrica, and currently is on gabapentin.  The gabapentin does offer some slight benefit, when she goes off the medication the pain significantly worsens.  The pain is worse at night when she tries to go to bed.  The patient denies issues controlling the bowels or the bladder.  She indicates that she has had EMG and nerve conduction study evaluation to confirm the diagnosis of neuropathy, this was done in [place], [place].  Past Medical History:  Diagnosis Date  . Allergy   . Arthritis   . Cancer ([place]) 1997, 2000   breast  . Cataract   . Diabetes mellitus without complication ([place])   . Diabetic nephropathy ([place])   . HLD (hyperlipidemia) 03/24/2017  . Neuropathy   . Osteoporosis 04/14/2017    Past Surgical History:  Procedure Laterality Date  . APPENDECTOMY    . BILATERAL CARPAL TUNNEL RELEASE Right   . BREAST SURGERY    . BUNIONECTOMY Bilateral 1968  . CATARACT EXTRACTION W/PHACO Right 08/30/2017   Procedure: CATARACT EXTRACTION PHACO AND INTRAOCULAR LENS PLACEMENT RIGHT EYE;  Surgeon: [name], MD;  Location: [facility];  Service: Ophthalmology;  Laterality: Right;  CDE: 13.63  . CATARACT EXTRACTION W/PHACO Left 09/20/2017   Procedure: CATARACT EXTRACTION PHACO AND INTRAOCULAR LENS PLACEMENT LEFT EYE;  Surgeon: [name], MD;  Location: [facility];  Service: Ophthalmology;  Laterality: Left;  CDE: 11.25  . 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 FOOT SURGERY Right   . FRACTURE SURGERY Left    knee fracture  . HERNIA REPAIR     ventral  . NASAL SEPTOPLASTY W/ TURBINOPLASTY Bilateral 01/04/2018   Procedure: NASAL SEPTOPLASTY WITH BILATERAL TURBINATE REDUCTION;  Surgeon: [name], MD;  Location: [facility];  Service: ENT;  Laterality: Bilateral;    Family History  Problem Relation Age of Onset  . Diabetes Mother   . Heart disease Mother   . Arthritis Mother   . Hypertension Mother   . Miscarriages / [place] Mother   . Early death Father   . Stroke Father   . Alcohol abuse Father     Social history:  reports that she quit smoking about 54 years ago. Her smoking use included cigarettes. She started smoking about 67 years ago. She has a 12.00 pack-year smoking history. She has never used smokeless tobacco. She reports that she does not drink alcohol or use drugs.  Medications:  Prior to Admission medications   Medication Sig Start Date End Date Taking? Authorizing Provider  gabapentin (NEURONTIN) 400 MG capsule TAKE 2 CAPSULES (800 MG TOTAL) BY MOUTH 4 (FOUR) TIMES DAILY. BREAKFAST, LUNCH, DINNER, & BEDTIME. 01/11/18  Yes Hagler, Apolonio Schneiders, MD  metFORMIN (GLUCOPHAGE) 500 MG tablet Take 1 tablet (500 mg total) by mouth daily after supper. 10/12/17  Yes Caren Macadam, MD  Multiple Vitamin (MULTIVITAMIN WITH MINERALS) TABS tablet Take 1 tablet by mouth daily.   Yes [provider]  Turmeric, Lear Ng, (CURCUMIN) POWD Take 1 capsule by mouth  daily.    Yes [provider]  DULoxetine (CYMBALTA) 30 MG capsule One tablet daily for 2 weeks, then take one twice a day 01/13/18   Kathrynn Ducking, MD      Allergies  Allergen Reactions  . Phenergan [Promethazine Hcl] Other (See Comments)    DELIRIUM (HALLUCINATIONS)    ROS:  Out of a complete 14 system review of symptoms, the patient complains only of the following symptoms, and all other reviewed systems are negative.  Numbness Increased thirst Dizziness Joint swelling Not enough sleep, decreased energy  Blood pressure 119/70, pulse 61, height 5\' 3"  (1.6 m), weight 181 lb (82.1 kg), SpO2 98 %.  Physical Exam  General: The patient is alert and cooperative at the time of the examination.  The patient is moderately obese.  Eyes: Pupils are equal, round, and reactive to light. Discs are flat bilaterally.  Neck: The neck is supple, no carotid bruits are noted.  Respiratory: The respiratory examination is clear.  Cardiovascular: The cardiovascular examination reveals a regular rate and rhythm, no obvious murmurs or rubs are noted.  Skin: Extremities are with 1+ edema at the ankles bilaterally.  Neurologic Exam  Mental status: The patient is alert and oriented x 3 at the time of the examination. The patient has apparent normal recent and remote memory, with an apparently normal attention span and concentration ability.  Cranial nerves: Facial symmetry is present. There is good sensation of the face to pinprick and soft touch bilaterally. The strength of the facial muscles and the muscles to head turning and shoulder shrug are normal bilaterally. Speech is well enunciated, no aphasia or dysarthria is noted. Extraocular movements are full. Visual fields are full. The tongue is midline, and the patient has symmetric elevation of the soft palate. No obvious hearing deficits are noted.  Motor: The motor testing reveals 5 over 5 strength of all 4 extremities. Good  symmetric motor tone is noted throughout.  Sensory: Sensory testing is intact to pinprick, soft touch, vibration sensation, and position sense on the upper extremities.  With the lower extremities there is a stocking pattern pinprick sensory deficit to the knees bilaterally, market impairment of vibration and position sense were seen in both feet.  No evidence of extinction is noted.  Coordination: Cerebellar testing reveals good finger-nose-finger and heel-to-shin bilaterally.  Gait and station: Gait is slightly wide-based, the patient usually uses a cane for ambulation. Tandem gait is unsteady. Romberg is negative. No drift is seen.  Reflexes: Deep tendon reflexes are symmetric and normal bilaterally, with exception of absent ankle jerk reflexes bilaterally. Toes are downgoing bilaterally.   Assessment/Plan:  1.  Peripheral neuropathy  2.  Mild gait disturbance  The patient is having severe discomfort in her feet associated with neuropathy with burning sensations in the hands and feet.  The patient will be given a trial on Cymbalta, working up on the dose, they will  call for any dose adjustments.  If this is not effective, we may try carbamazepine or Keppra in the future.  She will follow-up in 4 months.  Blood work will be done today.  Jill Alexanders MD 01/13/2018 2:37 PM  [facility] [address] [place], [place] [address]  Phone [phone] Fax [phone]

## 2018-01-13 NOTE — Patient Instructions (Signed)
We will try cymbalta for the neuropathy pain.

## 2018-01-14 ENCOUNTER — Other Ambulatory Visit: Payer: Self-pay | Admitting: Neurology

## 2018-01-14 DIAGNOSIS — M21611 Bunion of right foot: Secondary | ICD-10-CM | POA: Diagnosis not present

## 2018-01-14 DIAGNOSIS — M79672 Pain in left foot: Secondary | ICD-10-CM | POA: Diagnosis not present

## 2018-01-14 DIAGNOSIS — L97512 Non-pressure chronic ulcer of other part of right foot with fat layer exposed: Secondary | ICD-10-CM | POA: Diagnosis not present

## 2018-01-14 DIAGNOSIS — M21961 Unspecified acquired deformity of right lower leg: Secondary | ICD-10-CM | POA: Diagnosis not present

## 2018-01-14 DIAGNOSIS — G603 Idiopathic progressive neuropathy: Secondary | ICD-10-CM

## 2018-01-14 DIAGNOSIS — M79671 Pain in right foot: Secondary | ICD-10-CM | POA: Diagnosis not present

## 2018-01-17 ENCOUNTER — Telehealth: Payer: Self-pay | Admitting: Neurology

## 2018-01-17 DIAGNOSIS — M05741 Rheumatoid arthritis with rheumatoid factor of right hand without organ or systems involvement: Secondary | ICD-10-CM

## 2018-01-17 LAB — MULTIPLE MYELOMA PANEL, SERUM
ALBUMIN SERPL ELPH-MCNC: 3.8 g/dL (ref 2.9–4.4)
ALPHA2 GLOB SERPL ELPH-MCNC: 0.8 g/dL (ref 0.4–1.0)
Albumin/Glob SerPl: 1.1 (ref 0.7–1.7)
Alpha 1: 0.2 g/dL (ref 0.0–0.4)
B-GLOBULIN SERPL ELPH-MCNC: 1.4 g/dL — AB (ref 0.7–1.3)
GLOBULIN, TOTAL: 3.5 g/dL (ref 2.2–3.9)
Gamma Glob SerPl Elph-Mcnc: 1.1 g/dL (ref 0.4–1.8)
IgA/Immunoglobulin A, Serum: 485 mg/dL — ABNORMAL HIGH (ref 64–422)
IgG (Immunoglobin G), Serum: 987 mg/dL (ref 700–1600)
IgM (Immunoglobulin M), Srm: 206 mg/dL (ref 26–217)
TOTAL PROTEIN: 7.3 g/dL (ref 6.0–8.5)

## 2018-01-17 LAB — RHEUMATOID FACTOR: RHEUMATOID FACTOR: 167.2 [IU]/mL — AB (ref 0.0–13.9)

## 2018-01-17 LAB — B. BURGDORFI ANTIBODIES: Lyme IgG/IgM Ab: <0.91 {ISR} (ref 0.00–0.90)

## 2018-01-17 LAB — ANGIOTENSIN CONVERTING ENZYME: Angio Convert Enzyme: 42 U/L (ref 14–82)

## 2018-01-17 LAB — SEDIMENTATION RATE: Sed Rate: 47 mm/hr — ABNORMAL HIGH (ref 0–40)

## 2018-01-17 LAB — ANA W/REFLEX: ANA: NEGATIVE

## 2018-01-17 LAB — VITAMIN B12: Vitamin B-12: 1116 pg/mL (ref 232–1245)

## 2018-01-17 NOTE — Telephone Encounter (Signed)
I called the patient.  The blood work is highly suggestive of rheumatoid arthritis, the rheumatoid factor and CCP study were both extremely elevated.  Sedimentation rate was slightly elevated.  The patient denies any significant arthritis problems although she has noted some problems with her right thumb recently.  I will get a referral to rheumatology physician to see if they believe that she has rheumatoid arthritis and if it needs to be treated.

## 2018-01-19 ENCOUNTER — Encounter: Payer: Self-pay | Admitting: Family Medicine

## 2018-01-19 ENCOUNTER — Ambulatory Visit (INDEPENDENT_AMBULATORY_CARE_PROVIDER_SITE_OTHER): Payer: Medicare Other | Admitting: Family Medicine

## 2018-01-19 ENCOUNTER — Other Ambulatory Visit: Payer: Self-pay

## 2018-01-19 VITALS — BP 144/76 | HR 72 | Temp 98.5°F | Resp 12 | Ht 63.0 in | Wt 181.1 lb

## 2018-01-19 DIAGNOSIS — Z23 Encounter for immunization: Secondary | ICD-10-CM | POA: Diagnosis not present

## 2018-01-19 DIAGNOSIS — E1140 Type 2 diabetes mellitus with diabetic neuropathy, unspecified: Secondary | ICD-10-CM

## 2018-01-19 NOTE — Progress Notes (Signed)
Patient ID: Jessica Russell, female    DOB: [birth year], 84 y.o.   MRN: [id number]  Chief Complaint  Patient presents with  . Diabetes    follow up    Allergies Phenergan [promethazine hcl]  Subjective:   Jessica Russell is a 84 y.o. female who presents to [facility] today.  HPI Here for follow-up visit.  Reports that she has been doing well.  Has been seen by neurology since her last visit here.  Was evaluated there due to diabetic peripheral neuropathy.  Upon lab testing and neurology was noted to blood work consistent with rheumatoid arthritis.  Has been referred to rheumatology for evaluation.  Patient reports that she does not have any joint pain other than some pain in her right thumb.  She reports that her knees are not bothering her.  Her hands are not bothering her.   She was started on Cymbalta 30 mg twice a day.  Is taking the medication as directed.  Believes she is getting some minimal relief of pain. Taking metformin once a day for her sugars.  No side effects with medication.  No hypoglycemic episodes.  Last hemoglobin A1c checked 4 months ago which was less than 7%.   Denies any chest pain or shortness of breath.  Bowel movements are within normal limits. Would like to get her flu shot today.  Is due for repeat pneumonia 23 vaccination. Reports that her blood pressures been running very well.  Had recent sinus surgery and reports that she is breathing much better out of her nose since the surgery. Denies any fevers, nausea, sinus pain, or sinus pressure.   Past Medical History:  Diagnosis Date  . Allergy   . Arthritis   . Cancer ([place]) 1997, 2000   breast  . Cataract   . Diabetes mellitus without complication ([place])   . Diabetic nephropathy ([place])   . HLD (hyperlipidemia) 03/24/2017  . Neuropathy   . Osteoporosis 04/14/2017    Past Surgical History:  Procedure Laterality Date  . APPENDECTOMY    . BILATERAL CARPAL TUNNEL RELEASE Right   .  BREAST SURGERY    . BUNIONECTOMY Bilateral 1968  . CATARACT EXTRACTION W/PHACO Right 08/30/2017   Procedure: CATARACT EXTRACTION PHACO AND INTRAOCULAR LENS PLACEMENT RIGHT EYE;  Surgeon: [name], MD;  [facility];  Service: Ophthalmology;  Laterality: Right;  CDE: 13.63  . CATARACT EXTRACTION W/PHACO Left 09/20/2017   Procedure: CATARACT EXTRACTION PHACO AND INTRAOCULAR LENS PLACEMENT LEFT EYE;  Surgeon: [name], MD;  Location[facility];  Service: Ophthalmology;  Laterality: Left;  CDE: 11.25  . 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 FOOT SURGERY Right   . FRACTURE SURGERY Left    knee fracture  . HERNIA REPAIR     ventral  . NASAL SEPTOPLASTY W/ TURBINOPLASTY Bilateral 01/04/2018   Procedure: NASAL SEPTOPLASTY WITH BILATERAL TURBINATE REDUCTION;  Surgeon: [name], MD;  Location: [facility];  Service: ENT;  Laterality: Bilateral;    Family History  Problem Relation Age of Onset  . Diabetes Mother   . Heart disease Mother   . Arthritis Mother   . Hypertension Mother   . Miscarriages / [place] Mother   . Early death Father   . Stroke Father   . Alcohol abuse Father      Social History   Socioeconomic History  . Marital status: Married    Spouse name: Not on file  .  Number of children: 1  . Years of education: 26  . Highest education level: Not on file  Occupational History  . Occupation: retired  Scientific laboratory technician  . Financial resource strain: Not on file  . Food insecurity:    Worry: Not on file    Inability: Not on file  . Transportation needs:    Medical: Not on file    Non-medical: Not on file  Tobacco Use  . Smoking status: Former Smoker    Packs/day: 1.00    Years: 12.00    Pack years: 12.00    Types: Cigarettes    Start date: 05/11/1950    Last attempt to quit: 05/12/1963    Years since quitting: 54.7  . Smokeless tobacco: Never Used  Substance and Sexual Activity  . Alcohol use: No    Frequency: Never  . Drug  use: No  . Sexual activity: Not Currently    Birth control/protection: Post-menopausal  Lifestyle  . Physical activity:    Days per week: Not on file    Minutes per session: Not on file  . Stress: Not on file  Relationships  . Social connections:    Talks on phone: Not on file    Gets together: Not on file    Attends religious service: Not on file    Active member of club or organization: Not on file    Attends meetings of clubs or organizations: Not on file    Relationship status: Not on file  Other Topics Concern  . Not on file  Social History Narrative   Lives with husband [name]   One son [name] lives in [place]    Father was a [place] from [place], patient is bilingual   Married for over 69 years.    Caffeine use: 1 cup per day   Right handed   Current Outpatient Medications on File Prior to Visit  Medication Sig Dispense Refill  . DULoxetine (CYMBALTA) 30 MG capsule One tablet daily for 2 weeks, then take one twice a day 60 capsule 3  . gabapentin (NEURONTIN) 400 MG capsule TAKE 2 CAPSULES (800 MG TOTAL) BY MOUTH 4 (FOUR) TIMES DAILY. BREAKFAST, LUNCH, DINNER, & BEDTIME. 720 capsule 0  . metFORMIN (GLUCOPHAGE) 500 MG tablet Take 1 tablet (500 mg total) by mouth daily after supper. 90 tablet 0  . Multiple Vitamin (MULTIVITAMIN WITH MINERALS) TABS tablet Take 1 tablet by mouth daily.    . Turmeric, Curcuma Longa, (CURCUMIN) POWD Take 1 capsule by mouth daily.      No current facility-administered medications on file prior to visit.     Review of Systems  Constitutional: Negative for activity change, appetite change and fever.  Eyes: Negative for visual disturbance.  Respiratory: Negative for cough, chest tightness and shortness of breath.   Cardiovascular: Negative for chest pain, palpitations and leg swelling.  Gastrointestinal: Negative for abdominal pain, nausea and vomiting.  Genitourinary: Negative for dysuria, frequency and urgency.  Neurological: Negative for  dizziness, syncope and light-headedness.  Hematological: Negative for adenopathy.     Objective:   BP (!) 144/76 (BP Location: Left Arm, Patient Position: Sitting, Cuff Size: Large)   Pulse 72   Temp 98.5 F (36.9 C) (Temporal)   Resp 12   Ht 5\' 3"  (1.6 m)   Wt 181 lb 1.3 oz (82.1 kg)   SpO2 98% Comment: room air  BMI 32.08 kg/m   Physical Exam  Constitutional: She is oriented to person, place, and time. She appears well-developed  and well-nourished. No distress.  HENT:  Head: Normocephalic and atraumatic.  Eyes: Pupils are equal, round, and reactive to light.  Neck: Normal range of motion. Neck supple. No thyromegaly present.  Cardiovascular: Normal rate, regular rhythm and normal heart sounds.  Pulmonary/Chest: Effort normal and breath sounds normal. No respiratory distress.  Neurological: She is alert and oriented to person, place, and time. No cranial nerve deficit.  Skin: Skin is warm and dry.  Skin intact on visualized area. Patient still wearing right boot.   Psychiatric: She has a normal mood and affect. Her behavior is normal. Judgment and thought content normal.  Nursing note and vitals reviewed.  Depression screen [facility] 2/9 11/05/2017 10/12/2017 03/24/2017  Decreased Interest 0 0 0  Down, Depressed, Hopeless 0 0 0  PHQ - 2 Score 0 0 0    Assessment and Plan  1. Type 2 diabetes mellitus with diabetic neuropathy, without long-term current use of insulin ([place]) Patient defers blood work today.  Would like to get labs done by her new PCP.  She plans on establishing care with Dr. Anastasio Champion.  She will continue the metformin as directed.  2. Need for immunization against influenza - Flu Vaccine QUAD 36+ mos IM  3. Immunization due - Pneumococcal polysaccharide vaccine 23-valent greater than or equal to 2yo subcutaneous/IM  Continue to monitor blood pressure.  Dietary modifications recommended.  Patient will call with any questions, concerns, or worrisome symptoms.   Recommend establishing with new PCP within the next 1 to 2 months.  Patient voiced understanding. No follow-ups on file. Caren Macadam, MD 01/19/2018

## 2018-01-20 ENCOUNTER — Ambulatory Visit (INDEPENDENT_AMBULATORY_CARE_PROVIDER_SITE_OTHER): Payer: Medicare Other | Admitting: Otolaryngology

## 2018-01-20 DIAGNOSIS — E1140 Type 2 diabetes mellitus with diabetic neuropathy, unspecified: Secondary | ICD-10-CM | POA: Diagnosis not present

## 2018-01-20 DIAGNOSIS — L97512 Non-pressure chronic ulcer of other part of right foot with fat layer exposed: Secondary | ICD-10-CM | POA: Diagnosis not present

## 2018-01-26 ENCOUNTER — Other Ambulatory Visit: Payer: Self-pay

## 2018-01-26 ENCOUNTER — Telehealth: Payer: Self-pay | Admitting: Family Medicine

## 2018-01-26 DIAGNOSIS — E118 Type 2 diabetes mellitus with unspecified complications: Secondary | ICD-10-CM

## 2018-01-26 DIAGNOSIS — E1142 Type 2 diabetes mellitus with diabetic polyneuropathy: Secondary | ICD-10-CM

## 2018-01-26 MED ORDER — GABAPENTIN 400 MG PO CAPS: 800.0000 mg | ORAL_CAPSULE | Freq: Four times a day (QID) | ORAL | 0 refills | Status: DC

## 2018-01-26 MED ORDER — METFORMIN HCL 500 MG PO TABS: 500.0000 mg | ORAL_TABLET | Freq: Every day | ORAL | 0 refills | Status: DC

## 2018-01-26 NOTE — Telephone Encounter (Signed)
Medications refilled and sent to pharmacy requested

## 2018-01-26 NOTE — Telephone Encounter (Signed)
Please refill all medicine, please send to [facility] in [place].

## 2018-01-27 DIAGNOSIS — L97512 Non-pressure chronic ulcer of other part of right foot with fat layer exposed: Secondary | ICD-10-CM | POA: Diagnosis not present

## 2018-01-27 DIAGNOSIS — E11621 Type 2 diabetes mellitus with foot ulcer: Secondary | ICD-10-CM | POA: Diagnosis not present

## 2018-01-28 LAB — SPECIMEN STATUS REPORT

## 2018-02-03 DIAGNOSIS — L97512 Non-pressure chronic ulcer of other part of right foot with fat layer exposed: Secondary | ICD-10-CM | POA: Diagnosis not present

## 2018-02-03 DIAGNOSIS — M21961 Unspecified acquired deformity of right lower leg: Secondary | ICD-10-CM | POA: Diagnosis not present

## 2018-02-03 DIAGNOSIS — E1140 Type 2 diabetes mellitus with diabetic neuropathy, unspecified: Secondary | ICD-10-CM | POA: Diagnosis not present

## 2018-02-05 LAB — CYCLIC CITRUL PEPTIDE ANTIBODY, IGG/IGA: Cyclic Citrullin Peptide Ab: >250 units — ABNORMAL HIGH (ref 0–19)

## 2018-02-05 LAB — SPECIMEN STATUS REPORT

## 2018-02-12 ENCOUNTER — Other Ambulatory Visit: Payer: Self-pay | Admitting: Neurology

## 2018-02-17 DIAGNOSIS — M21611 Bunion of right foot: Secondary | ICD-10-CM | POA: Diagnosis not present

## 2018-02-17 DIAGNOSIS — L84 Corns and callosities: Secondary | ICD-10-CM | POA: Diagnosis not present

## 2018-02-17 DIAGNOSIS — E1151 Type 2 diabetes mellitus with diabetic peripheral angiopathy without gangrene: Secondary | ICD-10-CM | POA: Diagnosis not present

## 2018-02-17 DIAGNOSIS — E11621 Type 2 diabetes mellitus with foot ulcer: Secondary | ICD-10-CM | POA: Diagnosis not present

## 2018-02-17 DIAGNOSIS — B351 Tinea unguium: Secondary | ICD-10-CM | POA: Diagnosis not present

## 2018-02-17 DIAGNOSIS — M79671 Pain in right foot: Secondary | ICD-10-CM | POA: Diagnosis not present

## 2018-02-17 DIAGNOSIS — E1140 Type 2 diabetes mellitus with diabetic neuropathy, unspecified: Secondary | ICD-10-CM | POA: Diagnosis not present

## 2018-02-17 DIAGNOSIS — L97512 Non-pressure chronic ulcer of other part of right foot with fat layer exposed: Secondary | ICD-10-CM | POA: Diagnosis not present

## 2018-02-17 DIAGNOSIS — M79672 Pain in left foot: Secondary | ICD-10-CM | POA: Diagnosis not present

## 2018-02-21 DIAGNOSIS — M9905 Segmental and somatic dysfunction of pelvic region: Secondary | ICD-10-CM | POA: Diagnosis not present

## 2018-02-21 DIAGNOSIS — M545 Low back pain: Secondary | ICD-10-CM | POA: Diagnosis not present

## 2018-02-24 DIAGNOSIS — M9905 Segmental and somatic dysfunction of pelvic region: Secondary | ICD-10-CM | POA: Diagnosis not present

## 2018-02-24 DIAGNOSIS — M545 Low back pain: Secondary | ICD-10-CM | POA: Diagnosis not present

## 2018-02-28 DIAGNOSIS — M9905 Segmental and somatic dysfunction of pelvic region: Secondary | ICD-10-CM | POA: Diagnosis not present

## 2018-02-28 DIAGNOSIS — M545 Low back pain: Secondary | ICD-10-CM | POA: Diagnosis not present

## 2018-03-02 DIAGNOSIS — M21611 Bunion of right foot: Secondary | ICD-10-CM | POA: Diagnosis not present

## 2018-03-02 DIAGNOSIS — E1140 Type 2 diabetes mellitus with diabetic neuropathy, unspecified: Secondary | ICD-10-CM | POA: Diagnosis not present

## 2018-03-07 DIAGNOSIS — M9905 Segmental and somatic dysfunction of pelvic region: Secondary | ICD-10-CM | POA: Diagnosis not present

## 2018-03-07 DIAGNOSIS — M545 Low back pain: Secondary | ICD-10-CM | POA: Diagnosis not present

## 2018-03-14 DIAGNOSIS — M9905 Segmental and somatic dysfunction of pelvic region: Secondary | ICD-10-CM | POA: Diagnosis not present

## 2018-03-14 DIAGNOSIS — M545 Low back pain: Secondary | ICD-10-CM | POA: Diagnosis not present

## 2018-03-21 DIAGNOSIS — M9905 Segmental and somatic dysfunction of pelvic region: Secondary | ICD-10-CM | POA: Diagnosis not present

## 2018-03-21 DIAGNOSIS — M545 Low back pain: Secondary | ICD-10-CM | POA: Diagnosis not present

## 2018-03-23 DIAGNOSIS — M810 Age-related osteoporosis without current pathological fracture: Secondary | ICD-10-CM | POA: Diagnosis not present

## 2018-03-23 DIAGNOSIS — E785 Hyperlipidemia, unspecified: Secondary | ICD-10-CM | POA: Diagnosis not present

## 2018-03-23 DIAGNOSIS — G608 Other hereditary and idiopathic neuropathies: Secondary | ICD-10-CM | POA: Diagnosis not present

## 2018-03-23 DIAGNOSIS — E119 Type 2 diabetes mellitus without complications: Secondary | ICD-10-CM | POA: Diagnosis not present

## 2018-03-23 DIAGNOSIS — M059 Rheumatoid arthritis with rheumatoid factor, unspecified: Secondary | ICD-10-CM | POA: Diagnosis not present

## 2018-03-23 DIAGNOSIS — R5383 Other fatigue: Secondary | ICD-10-CM | POA: Diagnosis not present

## 2018-03-23 DIAGNOSIS — E559 Vitamin D deficiency, unspecified: Secondary | ICD-10-CM | POA: Diagnosis not present

## 2018-03-24 DIAGNOSIS — L97512 Non-pressure chronic ulcer of other part of right foot with fat layer exposed: Secondary | ICD-10-CM | POA: Diagnosis not present

## 2018-03-24 DIAGNOSIS — M21611 Bunion of right foot: Secondary | ICD-10-CM | POA: Diagnosis not present

## 2018-03-24 DIAGNOSIS — E1140 Type 2 diabetes mellitus with diabetic neuropathy, unspecified: Secondary | ICD-10-CM | POA: Diagnosis not present

## 2018-03-24 DIAGNOSIS — M21961 Unspecified acquired deformity of right lower leg: Secondary | ICD-10-CM | POA: Diagnosis not present

## 2018-03-28 ENCOUNTER — Ambulatory Visit (HOSPITAL_COMMUNITY)
Admission: RE | Admit: 2018-03-28 | Discharge: 2018-03-28 | Disposition: A | Discharge status: Home / Self Care | Payer: Medicare Other | Source: Ambulatory Visit | Attending: Cardiology | Admitting: Cardiology

## 2018-03-28 ENCOUNTER — Other Ambulatory Visit (HOSPITAL_COMMUNITY): Payer: Self-pay | Admitting: Podiatry

## 2018-03-28 DIAGNOSIS — E1140 Type 2 diabetes mellitus with diabetic neuropathy, unspecified: Secondary | ICD-10-CM | POA: Diagnosis not present

## 2018-03-28 DIAGNOSIS — L97919 Non-pressure chronic ulcer of unspecified part of right lower leg with unspecified severity: Secondary | ICD-10-CM | POA: Insufficient documentation

## 2018-03-28 DIAGNOSIS — L97512 Non-pressure chronic ulcer of other part of right foot with fat layer exposed: Secondary | ICD-10-CM | POA: Diagnosis not present

## 2018-03-28 DIAGNOSIS — M21961 Unspecified acquired deformity of right lower leg: Secondary | ICD-10-CM | POA: Diagnosis not present

## 2018-03-28 DIAGNOSIS — M21611 Bunion of right foot: Secondary | ICD-10-CM | POA: Diagnosis not present

## 2018-04-02 ENCOUNTER — Emergency Department (HOSPITAL_COMMUNITY)
Admission: EM | Admit: 2018-04-02 | Discharge: 2018-04-02 | Disposition: A | Discharge status: Home / Self Care | Payer: Medicare Other | Attending: Emergency Medicine | Admitting: Emergency Medicine

## 2018-04-02 ENCOUNTER — Emergency Department (HOSPITAL_COMMUNITY): Payer: Medicare Other

## 2018-04-02 ENCOUNTER — Other Ambulatory Visit: Payer: Self-pay

## 2018-04-02 DIAGNOSIS — M25511 Pain in right shoulder: Secondary | ICD-10-CM

## 2018-04-02 DIAGNOSIS — Z79899 Other long term (current) drug therapy: Secondary | ICD-10-CM | POA: Diagnosis not present

## 2018-04-02 DIAGNOSIS — Z7984 Long term (current) use of oral hypoglycemic drugs: Secondary | ICD-10-CM | POA: Insufficient documentation

## 2018-04-02 DIAGNOSIS — Z87891 Personal history of nicotine dependence: Secondary | ICD-10-CM | POA: Insufficient documentation

## 2018-04-02 DIAGNOSIS — Y939 Activity, unspecified: Secondary | ICD-10-CM | POA: Insufficient documentation

## 2018-04-02 DIAGNOSIS — E785 Hyperlipidemia, unspecified: Secondary | ICD-10-CM | POA: Insufficient documentation

## 2018-04-02 DIAGNOSIS — Z853 Personal history of malignant neoplasm of breast: Secondary | ICD-10-CM | POA: Diagnosis not present

## 2018-04-02 DIAGNOSIS — S4991XA Unspecified injury of right shoulder and upper arm, initial encounter: Secondary | ICD-10-CM | POA: Diagnosis not present

## 2018-04-02 DIAGNOSIS — M70841 Other soft tissue disorders related to use, overuse and pressure, right hand: Secondary | ICD-10-CM | POA: Insufficient documentation

## 2018-04-02 DIAGNOSIS — E1121 Type 2 diabetes mellitus with diabetic nephropathy: Secondary | ICD-10-CM | POA: Insufficient documentation

## 2018-04-02 DIAGNOSIS — S6991XA Unspecified injury of right wrist, hand and finger(s), initial encounter: Secondary | ICD-10-CM | POA: Diagnosis not present

## 2018-04-02 DIAGNOSIS — M778 Other enthesopathies, not elsewhere classified: Secondary | ICD-10-CM | POA: Diagnosis not present

## 2018-04-02 LAB — CBC WITH DIFFERENTIAL/PLATELET
Abs Immature Granulocytes: 0.02 10*3/uL (ref 0.00–0.07)
BASOS ABS: 0.1 10*3/uL (ref 0.0–0.1)
Basophils Relative: 1 %
EOS ABS: 0.3 10*3/uL (ref 0.0–0.5)
EOS PCT: 5 %
HCT: 44 % (ref 36.0–46.0)
Hemoglobin: 13.3 g/dL (ref 12.0–15.0)
Immature Granulocytes: 0 %
LYMPHS ABS: 1.1 10*3/uL (ref 0.7–4.0)
LYMPHS PCT: 17 %
MCH: 26.8 pg (ref 26.0–34.0)
MCHC: 30.2 g/dL (ref 30.0–36.0)
MCV: 88.7 fL (ref 80.0–100.0)
Monocytes Absolute: 0.5 10*3/uL (ref 0.1–1.0)
Monocytes Relative: 8 %
NEUTROS PCT: 69 %
NRBC: 0 % (ref 0.0–0.2)
Neutro Abs: 4.2 10*3/uL (ref 1.7–7.7)
Platelets: 215 10*3/uL (ref 150–400)
RBC: 4.96 MIL/uL (ref 3.87–5.11)
RDW: 14.7 % (ref 11.5–15.5)
WBC: 6.2 10*3/uL (ref 4.0–10.5)

## 2018-04-02 LAB — BASIC METABOLIC PANEL
Anion gap: 10 (ref 5–15)
BUN: 14 mg/dL (ref 8–23)
CALCIUM: 9.2 mg/dL (ref 8.9–10.3)
CHLORIDE: 108 mmol/L (ref 98–111)
CO2: 21 mmol/L — AB (ref 22–32)
CREATININE: 0.74 mg/dL (ref 0.44–1.00)
GFR calc non Af Amer: >60 mL/min (ref >60)
Glucose, Bld: 123 mg/dL — ABNORMAL HIGH (ref 70–99)
Potassium: 4.2 mmol/L (ref 3.5–5.1)
SODIUM: 139 mmol/L (ref 135–145)

## 2018-04-02 LAB — SEDIMENTATION RATE: SED RATE: 27 mm/h — AB (ref 0–22)

## 2018-04-02 NOTE — ED Triage Notes (Signed)
Pt reports right shoulder pain x 2-3 months, now having pain in lower arm. Swelling to right wrist and hand x 1 month. Currently on amox 500mg  from foot doctor. Not taking anything for pain. States pain is worse with movement, improves with rest.

## 2018-04-02 NOTE — Discharge Instructions (Addendum)
You were evaluated in the emergency department for right shoulder pain and right wrist pain.  Your x-rays did not show any obvious fracture or dislocation.  You should take ibuprofen 3 times a day with food on your stomach.  Please contact Dr. Aline Brochure for orthopedic follow-up.  You can use the wrist splint as needed for comfort.  Return if any concerns.

## 2018-04-02 NOTE — ED Provider Notes (Signed)
[facility] EMERGENCY DEPARTMENT Provider Note   CSN: [id number] Arrival date & time: 04/02/18  3810     History   Chief Complaint Chief Complaint  Patient presents with  . Arm Pain    HPI Jessica Russell is a 84 y.o. female.  She is presenting to the emergency department complaining of right arm pain.  She says her right shoulder is been hurting her for about 2 or 3 months increased with any kind of internal/external rotation or lifting.  She does not recall any specific trauma.  She said for about a week she has had increased pain at her right wrist into her forearm.  Worse with any kind of movements.  It also appears swollen.  No trauma no fevers no chills.  She is being treated for a wound infection on her foot on amoxicillin.  She supposed to be getting surgery on that.  She will try some Tylenol once in a while for her pain.  She saw a new primary care doctor who did some blood work a few weeks ago when she has a follow-up appointment with them next month.  The history is provided by the patient.  Arm Pain  This is a new problem. The current episode started more than 1 week ago. The problem occurs constantly. The problem has not changed since onset.Pertinent negatives include no chest pain, no abdominal pain, no headaches and no shortness of breath. The symptoms are aggravated by twisting and bending. Nothing relieves the symptoms. She has tried acetaminophen for the symptoms. The treatment provided mild relief.    Past Medical History:  Diagnosis Date  . Allergy   . Arthritis   . Cancer ([place]) 1997, 2000   breast  . Cataract   . Diabetes mellitus without complication ([place])   . Diabetic nephropathy ([place])   . HLD (hyperlipidemia) 03/24/2017  . Neuropathy   . Osteoporosis 04/14/2017    Patient Active Problem List   Diagnosis Date Noted  . Abscess of right foot   . Diabetes mellitus type II, non insulin dependent ([place]) 05/03/2017  . Diabetic foot ulcer ([place]) 05/03/2017    . Cellulitis 05/03/2017  . Diabetic foot infection ([place])   . Chronic osteoarthritis 04/14/2017  . Episodic recurrent vertigo 04/14/2017  . Osteoporosis 04/14/2017  . Diabetic nephropathy ([place]) 03/24/2017  . Diabetic neuropathy associated with type 2 diabetes mellitus ([place]) 03/24/2017  . Cataracts, bilateral 03/24/2017  . History of bilateral breast cancer 03/24/2017  . Recurrent UTI 03/24/2017  . HLD (hyperlipidemia) 03/24/2017  . Diverticulosis 03/24/2017    Past Surgical History:  Procedure Laterality Date  . APPENDECTOMY    . BILATERAL CARPAL TUNNEL RELEASE Right   . BREAST SURGERY    . BUNIONECTOMY Bilateral 1968  . CATARACT EXTRACTION W/PHACO Right 08/30/2017   Procedure: CATARACT EXTRACTION PHACO AND INTRAOCULAR LENS PLACEMENT RIGHT EYE;  Surgeon: [name], MD;  Location: [facility];  Service: Ophthalmology;  Laterality: Right;  CDE: 13.63  . CATARACT EXTRACTION W/PHACO Left 09/20/2017   Procedure: CATARACT EXTRACTION PHACO AND INTRAOCULAR LENS PLACEMENT LEFT EYE;  Surgeon: [name], MD;  Location: [facility];  Service: Ophthalmology;  Laterality: Left;  CDE: 11.25  . 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 FOOT SURGERY Right   . FRACTURE SURGERY Left    knee fracture  . HERNIA REPAIR     ventral  . NASAL SEPTOPLASTY W/ TURBINOPLASTY Bilateral 01/04/2018   Procedure: NASAL SEPTOPLASTY WITH BILATERAL TURBINATE REDUCTION;  Surgeon: [name], MD;  Location: [facility];  Service: ENT;  Laterality: Bilateral;     OB History    Gravida  1   Para      Term      Preterm      AB      Living  1     SAB      TAB      Ectopic      Multiple      Live Births               Home Medications    Prior to Admission medications   Medication Sig Start Date End Date Taking? Authorizing Provider  DULoxetine (CYMBALTA) 30 MG capsule ONE TABLET DAILY FOR 2 WEEKS, THEN TAKE ONE TWICE A DAY 02/14/18   Kathrynn Ducking, MD  gabapentin  (NEURONTIN) 400 MG capsule Take 2 capsules (800 mg total) by mouth 4 (four) times daily. BREAKFAST, LUNCH, DINNER, & BEDTIME. 01/26/18   Caren Macadam, MD  metFORMIN (GLUCOPHAGE) 500 MG tablet Take 1 tablet (500 mg total) by mouth daily after supper. 01/26/18   Caren Macadam, MD  Multiple Vitamin (MULTIVITAMIN WITH MINERALS) TABS tablet Take 1 tablet by mouth daily.    [provider]  Turmeric, Lear Ng, (CURCUMIN) POWD Take 1 capsule by mouth daily.     [provider]    Family History Family History  Problem Relation Age of Onset  . Diabetes Mother   . Heart disease Mother   . Arthritis Mother   . Hypertension Mother   . Miscarriages / [place] Mother   . Early death Father   . Stroke Father   . Alcohol abuse Father     Social History Social History   Tobacco Use  . Smoking status: Former Smoker    Packs/day: 1.00    Years: 12.00    Pack years: 12.00    Types: Cigarettes    Start date: 05/11/1950    Last attempt to quit: 05/12/1963    Years since quitting: 54.9  . Smokeless tobacco: Never Used  Substance Use Topics  . Alcohol use: No    Frequency: Never  . Drug use: No     Allergies   Phenergan [promethazine hcl]   Review of Systems Review of Systems  Constitutional: Negative for fever.  HENT: Negative for sore throat.   Eyes: Negative for visual disturbance.  Respiratory: Negative for shortness of breath.   Cardiovascular: Negative for chest pain.  Gastrointestinal: Negative for abdominal pain.  Genitourinary: Negative for dysuria.  Musculoskeletal: Positive for back pain. Negative for neck pain.  Skin: Positive for wound. Negative for rash.  Neurological: Negative for headaches.     Physical Exam Updated Vital Signs Ht 5\' 3"  (1.6 m)   Wt 82.1 kg   BMI 32.06 kg/m   Physical Exam  Constitutional: She appears well-developed and well-nourished.  HENT:  Head: Normocephalic and atraumatic.  Eyes: Conjunctivae are normal.    Neck: Neck supple.  Cardiovascular: Normal rate, regular rhythm and normal heart sounds.  Pulmonary/Chest: Effort normal. No stridor. She has no wheezes.  Abdominal: Soft. There is no tenderness. There is no guarding.  Musculoskeletal: She exhibits tenderness. She exhibits no deformity.  She has diffuse tenderness on the right anterior posterior shoulder and pain with any kind of range of motion of the joint.  Normal landmarks.  Elbow full range of motion nontender.  Right wrist tender over the snuffbox and on the  lateral part of the forearm.  Positive Finkelstein test.  No overlying erythema.  Left upper extremity full range of motion without any pain.  Neurological: She is alert. GCS eye subscore is 4. GCS verbal subscore is 5. GCS motor subscore is 6.  Skin: Skin is warm and dry.  Psychiatric: She has a normal mood and affect.  Nursing note and vitals reviewed.    ED Treatments / Results  Labs (all labs ordered are listed, but only abnormal results are displayed) Labs Reviewed  BASIC METABOLIC PANEL - Abnormal; Notable for the following components:      Result Value   CO2 21 (*)    Glucose, Bld 123 (*)    All other components within normal limits  SEDIMENTATION RATE - Abnormal; Notable for the following components:   Sed Rate 27 (*)    All other components within normal limits  CBC WITH DIFFERENTIAL/PLATELET    EKG None  Radiology Dg Shoulder Right  Result Date: 04/02/2018 CLINICAL DATA:  Right shoulder injury.  Initial encounter. EXAM: RIGHT SHOULDER - 2+ VIEW COMPARISON:  None. FINDINGS: No acute fracture or dislocation identified. Bones are osteopenic. Mild degenerative disease of the glenohumeral joint. Clips are noted related to prior axillary node dissection. No bony lesions identified. IMPRESSION: No acute findings. Electronically Signed   By: Aletta Edouard M.D.   On: 04/02/2018 09:51   Dg Wrist Complete Right  Result Date: 04/02/2018 CLINICAL DATA:  Right wrist  injury.  Initial encounter. EXAM: RIGHT WRIST - COMPLETE 3+ VIEW COMPARISON:  None. FINDINGS: No acute fracture or dislocation identified. Mild soft tissue swelling overlying the distal ulna. Mild degenerative disease of the radiocarpal joint and carpal metacarpal joints. No bony lesions or destruction. IMPRESSION: No acute fracture identified. Electronically Signed   By: Aletta Edouard M.D.   On: 04/02/2018 09:52    Procedures Procedures (including critical care time)  Medications Ordered in ED Medications - No data to display   Initial Impression / Assessment and Plan / ED Course  I have reviewed the triage vital signs and the nursing notes.  Pertinent labs & imaging results that were available during my care of the patient were reviewed by me and considered in my medical decision making (see chart for details).      Final Clinical Impressions(s) / ED Diagnoses   Final diagnoses:  Acute pain of right shoulder  Right wrist tendonitis    ED Discharge Orders    None       Hayden Rasmussen, MD 04/02/18 [phone]

## 2018-04-04 DIAGNOSIS — M9905 Segmental and somatic dysfunction of pelvic region: Secondary | ICD-10-CM | POA: Diagnosis not present

## 2018-04-04 DIAGNOSIS — M545 Low back pain: Secondary | ICD-10-CM | POA: Diagnosis not present

## 2018-04-05 ENCOUNTER — Encounter: Payer: Self-pay | Admitting: Cardiovascular Disease

## 2018-04-05 ENCOUNTER — Ambulatory Visit (INDEPENDENT_AMBULATORY_CARE_PROVIDER_SITE_OTHER): Payer: Medicare Other | Admitting: Cardiovascular Disease

## 2018-04-05 VITALS — BP 118/68 | HR 70 | Ht 63.0 in | Wt 183.6 lb

## 2018-04-05 DIAGNOSIS — Z01812 Encounter for preprocedural laboratory examination: Secondary | ICD-10-CM

## 2018-04-05 DIAGNOSIS — E785 Hyperlipidemia, unspecified: Secondary | ICD-10-CM | POA: Diagnosis not present

## 2018-04-05 DIAGNOSIS — L02611 Cutaneous abscess of right foot: Secondary | ICD-10-CM | POA: Diagnosis not present

## 2018-04-05 DIAGNOSIS — I739 Peripheral vascular disease, unspecified: Secondary | ICD-10-CM | POA: Diagnosis not present

## 2018-04-05 DIAGNOSIS — S91109A Unspecified open wound of unspecified toe(s) without damage to nail, initial encounter: Secondary | ICD-10-CM

## 2018-04-05 MED ORDER — ASPIRIN EC 81 MG PO TBEC: 81.0000 mg | DELAYED_RELEASE_TABLET | Freq: Every day | ORAL | 3 refills | Status: DC

## 2018-04-05 NOTE — Patient Instructions (Signed)
Medication Instructions:  Your physician has recommended you make the following change in your medication:  1) START Aspirin 81 mg tablet by mouth ONCE daily  If you need a refill on your cardiac medications before your next appointment, please call your pharmacy.   Lab work: Your physician recommends that you return for lab work in: TODAY  If you have labs (blood work) drawn today and your tests are completely normal, you will receive your results only by: [name] MyChart Message (if you have MyChart) OR . A paper copy in the mail If you have any lab test that is abnormal or we need to change your treatment, we will call you to review the results.  Testing/Procedures: none  Follow-Up: At [facility], you and your health needs are our priority.  As part of our continuing mission to provide you with exceptional heart care, we have created designated Provider Care Teams.  These Care Teams include your primary Cardiologist (physician) and Advanced Practice Providers (APPs -  Physician Assistants and Nurse Practitioners) who all work together to provide you with the care you need, when you need it. You will need a follow up appointment in 1 months from 04/13/2018.  Please call our office 2 months in advance to schedule this appointment.  You may see Dr. Fletcher Anon - PV or one of the following Advanced Practice Providers on your designated Care Team:   Kerin Ransom, PA-C Roby Lofts, [place] . Sande Rives, PA-C  Any Other Special Instructions Will Be Listed Below (If Applicable).

## 2018-04-05 NOTE — Progress Notes (Signed)
Cardiology Office Note   Date:  04/07/2018   ID:  Jessica Russell, DOB [birth year], MRN [id number]  PCP:  Doree Albee, MD  Cardiologist:   Kathlyn Sacramento, MD   Chief Complaint  Patient presents with  . Follow-up    pt denied chest pain      History of Present Illness: Jessica Russell is a 84 y.o. female who was referred by Dr. Fritzi Mandes for evaluation management of peripheral arterial disease. She has known history of diabetes mellitus with peripheral neuropathy, rheumatoid arthritis and hyperlipidemia. She was referred for vascular studies recently due to ulceration on the right foot.  ABI was 1.10 on the right and 1.06 on the left.  Toe brachial index was abnormal on the right at 0.46 and normal on the left.  Duplex showed occluded right posterior tibial artery with no other obstructive disease.  ABI was not reliable due to calcified vessels. She reports possible injury to the bottom of the right big toe about 1 year ago which resulted in a big ulceration.  She had wound care for a while in addition to oxygen therapy and 2 grafts done.  The wound has improved significantly but has not healed as there is still some drainage.  She is scheduled to have surgery done on December 9 but was referred for vascular studies for this reason.  She has no claudication.  She is not a smoker.  Past Medical History:  Diagnosis Date  . Allergy   . Arthritis   . Cancer ([place]) 1997, 2000   breast  . Cataract   . Diabetes mellitus without complication ([place])   . Diabetic nephropathy ([place])   . HLD (hyperlipidemia) 03/24/2017  . Neuropathy   . Osteoporosis 04/14/2017    Past Surgical History:  Procedure Laterality Date  . APPENDECTOMY    . BILATERAL CARPAL TUNNEL RELEASE Right   . BREAST SURGERY    . BUNIONECTOMY Bilateral 1968  . CATARACT EXTRACTION W/PHACO Right 08/30/2017   Procedure: CATARACT EXTRACTION PHACO AND INTRAOCULAR LENS PLACEMENT RIGHT EYE;  Surgeon: [name], MD;   Location: [facility];  Service: Ophthalmology;  Laterality: Right;  CDE: 13.63  . CATARACT EXTRACTION W/PHACO Left 09/20/2017   Procedure: CATARACT EXTRACTION PHACO AND INTRAOCULAR LENS PLACEMENT LEFT EYE;  Surgeon: [name], MD;  Location: [facility];  Service: Ophthalmology;  Laterality: Left;  CDE: 11.25  . 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 FOOT SURGERY Right   . FRACTURE SURGERY Left    knee fracture  . HERNIA REPAIR     ventral  . NASAL SEPTOPLASTY W/ TURBINOPLASTY Bilateral 01/04/2018   Procedure: NASAL SEPTOPLASTY WITH BILATERAL TURBINATE REDUCTION;  Surgeon: [name], MD;  Location: [facility];  Service: ENT;  Laterality: Bilateral;     Current Outpatient Medications  Medication Sig Dispense Refill  . amoxicillin (AMOXIL) 500 MG capsule Take 500 mg by mouth 2 (two) times daily.     . fluocinonide gel (LIDEX) 5.27 % Apply 1 application topically daily as needed (mouth sores).   0  . gabapentin (NEURONTIN) 400 MG capsule Take 2 capsules (800 mg total) by mouth 4 (four) times daily. BREAKFAST, LUNCH, DINNER, & BEDTIME. 720 capsule 0  . metFORMIN (GLUCOPHAGE) 500 MG tablet Take 1 tablet (500 mg total) by mouth daily after supper. 90 tablet 0  . Multiple Vitamin (MULTIVITAMIN WITH MINERALS) TABS tablet Take 1 tablet by mouth daily.    [name]  Turmeric, Curcuma [place], (CURCUMIN) POWD Take 1 capsule by mouth daily.     [name] aspirin EC 81 MG tablet Take 1 tablet (81 mg total) by mouth daily. 90 tablet 3  . ibuprofen (ADVIL,MOTRIN) 200 MG tablet Take 600 mg by mouth 3 (three) times daily with meals.    . lidocaine (LMX) 4 % cream Apply 1 application topically as needed (pain).    . Methylsulfonylmethane (MSM PO) Take 1,000 mg by mouth 3 (three) times daily.    . Naphazoline HCl (CLEAR EYES OP) Place 1 drop into both eyes daily.    [name] OVER THE COUNTER MEDICATION Take 2 tablets by mouth daily. [employer] otc supplement    . OVER THE COUNTER MEDICATION Take 1 tablet  by mouth daily. Colon Support otc supplement    . Pramoxine-Dimethicone (GOLD BOND INTENSIVE HEALING EX) Apply 1 application topically daily.    . Skin Protectants, Misc. (AMERIGEL BARRIER EX) Apply 1 application topically daily. Apply to wound on foot     No current facility-administered medications for this visit.     Allergies:   Phenergan [promethazine hcl]; Ciprofloxacin; and Levofloxacin    Social History:  The patient  reports that she quit smoking about 54 years ago. Her smoking use included cigarettes. She started smoking about 67 years ago. She has a 12.00 pack-year smoking history. She has never used smokeless tobacco. She reports that she does not drink alcohol or use drugs.   Family History:  The patient's family history includes Alcohol abuse in her father; Arthritis in her mother; Diabetes in her mother; Early death in her father; Heart disease in her mother; Hypertension in her mother; Miscarriages / Stillbirths in her mother; Stroke in her father.    ROS:  Please see the history of present illness.   Otherwise, review of systems are positive for none.   All other systems are reviewed and negative.    PHYSICAL EXAM: VS:  BP 118/68   Pulse 70   Ht 5\' 3"  (1.6 m)   Wt 183 lb 9.6 oz (83.3 kg)   BMI 32.52 kg/m  , BMI Body mass index is 32.52 kg/m. GEN: Well nourished, well developed, in no acute distress  HEENT: normal  Neck: no JVD, carotid bruits, or masses Cardiac: RRR; no  rubs, or gallops,no edema .  2 out of 6 systolic murmur in the aortic area which is early peaking. Respiratory:  clear to auscultation bilaterally, normal work of breathing GI: soft, nontender, nondistended, + BS MS: no deformity or atrophy  Skin: warm and dry, no rash Neuro:  Strength and sensation are intact Psych: euthymic mood, full affect Vascular: Femoral pulses normal bilaterally.  Dorsalis pedis is very faint on the right side and posterior tibial is not palpable.   EKG:  EKG is not  ordered today.   Recent Labs: 05/03/2017: ALT 18 04/05/2018: BUN 15; Creatinine, Ser 0.65; Hemoglobin 11.8; Platelets 214; Potassium 4.7; Sodium 142    Lipid Panel No results found for: CHOL, TRIG, HDL, CHOLHDL, VLDL, LDLCALC, LDLDIRECT    Wt Readings from Last 3 Encounters:  04/05/18 183 lb 9.6 oz (83.3 kg)  04/02/18 181 lb (82.1 kg)  01/19/18 181 lb 1.3 oz (82.1 kg)       No flowsheet data found.    ASSESSMENT AND PLAN:  1.  Peripheral arterial disease with nonhealing ulceration on the bottom of the right big toe: This has been present for more than 1 year in spite of aggressive wound care.  She does have peripheral neuropathy and also has deformity in that foot which is the likely culprit.  There are plans for further surgery on December 9.  Based on her noninvasive vascular studies, I explained to her that her circulation is not severely abnormal but at the same time it might not be optimal enough to allow wound healing.  Her toe pressure is significantly reduced with monophasic waveforms and she has an occluded posterior tibial artery.  ABI was falsely elevated due to calcified vessels.  The best way to evaluate her circulation is to proceed with angiography with possible endovascular intervention.  I discussed the procedure in details as well as risks and benefits. I also discussed the case with Dr. Fritzi Mandes.  2.  Hyperlipidemia: I do not have any previous lipid profile but even if she has normal lipids, we should consider treatment with a statin given that she is diabetic with peripheral arterial disease.   Disposition:   FU with me in 1 month  Signed,  Kathlyn Sacramento, MD  04/07/2018 10:41 AM    [place]

## 2018-04-05 NOTE — H&P (View-Only) (Signed)
Cardiology Office Note   Date:  04/07/2018   ID:  Jessica Russell, DOB 1927-05-27, MRN [id number]  PCP:  Doree Albee, MD  Cardiologist:   Kathlyn Sacramento, MD   Chief Complaint  Patient presents with  . Follow-up    pt denied chest pain      History of Present Illness: Jessica Russell is a 84 y.o. female who was referred by Dr. Fritzi Mandes for evaluation management of peripheral arterial disease. She has known history of diabetes mellitus with peripheral neuropathy, rheumatoid arthritis and hyperlipidemia. She was referred for vascular studies recently due to ulceration on the right foot.  ABI was 1.10 on the right and 1.06 on the left.  Toe brachial index was abnormal on the right at 0.46 and normal on the left.  Duplex showed occluded right posterior tibial artery with no other obstructive disease.  ABI was not reliable due to calcified vessels. She reports possible injury to the bottom of the right big toe about 1 year ago which resulted in a big ulceration.  She had wound care for a while in addition to oxygen therapy and 2 grafts done.  The wound has improved significantly but has not healed as there is still some drainage.  She is scheduled to have surgery done on December 9 but was referred for vascular studies for this reason.  She has no claudication.  She is not a smoker.  Past Medical History:  Diagnosis Date  . Allergy   . Arthritis   . Cancer ([place]) 1997, 2000   breast  . Cataract   . Diabetes mellitus without complication ([place])   . Diabetic nephropathy ([place])   . HLD (hyperlipidemia) 03/24/2017  . Neuropathy   . Osteoporosis 04/14/2017    Past Surgical History:  Procedure Laterality Date  . APPENDECTOMY    . BILATERAL CARPAL TUNNEL RELEASE Right   . BREAST SURGERY    . BUNIONECTOMY Bilateral 1968  . CATARACT EXTRACTION W/PHACO Right 08/30/2017   Procedure: CATARACT EXTRACTION PHACO AND INTRAOCULAR LENS PLACEMENT RIGHT EYE;  Surgeon: [name], MD;   Location: [facility];  Service: Ophthalmology;  Laterality: Right;  CDE: 13.63  . CATARACT EXTRACTION W/PHACO Left 09/20/2017   Procedure: CATARACT EXTRACTION PHACO AND INTRAOCULAR LENS PLACEMENT LEFT EYE;  Surgeon: [name], MD;  Location: [facility];  Service: Ophthalmology;  Laterality: Left;  CDE: 11.25  . 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 FOOT SURGERY Right   . FRACTURE SURGERY Left    knee fracture  . HERNIA REPAIR     ventral  . NASAL SEPTOPLASTY W/ TURBINOPLASTY Bilateral 01/04/2018   Procedure: NASAL SEPTOPLASTY WITH BILATERAL TURBINATE REDUCTION;  Surgeon: [name], MD;  Location: [facility];  Service: ENT;  Laterality: Bilateral;     Current Outpatient Medications  Medication Sig Dispense Refill  . amoxicillin (AMOXIL) 500 MG capsule Take 500 mg by mouth 2 (two) times daily.     . fluocinonide gel (LIDEX) 8.24 % Apply 1 application topically daily as needed (mouth sores).   0  . gabapentin (NEURONTIN) 400 MG capsule Take 2 capsules (800 mg total) by mouth 4 (four) times daily. BREAKFAST, LUNCH, DINNER, & BEDTIME. 720 capsule 0  . metFORMIN (GLUCOPHAGE) 500 MG tablet Take 1 tablet (500 mg total) by mouth daily after supper. 90 tablet 0  . Multiple Vitamin (MULTIVITAMIN WITH MINERALS) TABS tablet Take 1 tablet by mouth daily.    [name]  Turmeric, Curcuma Longa, (CURCUMIN) POWD Take 1 capsule by mouth daily.     [name] aspirin EC 81 MG tablet Take 1 tablet (81 mg total) by mouth daily. 90 tablet 3  . ibuprofen (ADVIL,MOTRIN) 200 MG tablet Take 600 mg by mouth 3 (three) times daily with meals.    . lidocaine (LMX) 4 % cream Apply 1 application topically as needed (pain).    . Methylsulfonylmethane (MSM PO) Take 1,000 mg by mouth 3 (three) times daily.    . Naphazoline HCl (CLEAR EYES OP) Place 1 drop into both eyes daily.    [name] OVER THE COUNTER MEDICATION Take 2 tablets by mouth daily. [employer] otc supplement    . OVER THE COUNTER MEDICATION Take 1 tablet  by mouth daily. Colon Support otc supplement    . Pramoxine-Dimethicone (GOLD BOND INTENSIVE HEALING EX) Apply 1 application topically daily.    . Skin Protectants, Misc. (AMERIGEL BARRIER EX) Apply 1 application topically daily. Apply to wound on foot     No current facility-administered medications for this visit.     Allergies:   Phenergan [promethazine hcl]; Ciprofloxacin; and Levofloxacin    Social History:  The patient  reports that she quit smoking about 54 years ago. Her smoking use included cigarettes. She started smoking about 67 years ago. She has a 12.00 pack-year smoking history. She has never used smokeless tobacco. She reports that she does not drink alcohol or use drugs.   Family History:  The patient's family history includes Alcohol abuse in her father; Arthritis in her mother; Diabetes in her mother; Early death in her father; Heart disease in her mother; Hypertension in her mother; Miscarriages / Stillbirths in her mother; Stroke in her father.    ROS:  Please see the history of present illness.   Otherwise, review of systems are positive for none.   All other systems are reviewed and negative.    PHYSICAL EXAM: VS:  BP 118/68   Pulse 70   Ht 5\' 3"  (1.6 m)   Wt 183 lb 9.6 oz (83.3 kg)   BMI 32.52 kg/m  , BMI Body mass index is 32.52 kg/m. GEN: Well nourished, well developed, in no acute distress  HEENT: normal  Neck: no JVD, carotid bruits, or masses Cardiac: RRR; no  rubs, or gallops,no edema .  2 out of 6 systolic murmur in the aortic area which is early peaking. Respiratory:  clear to auscultation bilaterally, normal work of breathing GI: soft, nontender, nondistended, + BS MS: no deformity or atrophy  Skin: warm and dry, no rash Neuro:  Strength and sensation are intact Psych: euthymic mood, full affect Vascular: Femoral pulses normal bilaterally.  Dorsalis pedis is very faint on the right side and posterior tibial is not palpable.   EKG:  EKG is not  ordered today.   Recent Labs: 05/03/2017: ALT 18 04/05/2018: BUN 15; Creatinine, Ser 0.65; Hemoglobin 11.8; Platelets 214; Potassium 4.7; Sodium 142    Lipid Panel No results found for: CHOL, TRIG, HDL, CHOLHDL, VLDL, LDLCALC, LDLDIRECT    Wt Readings from Last 3 Encounters:  04/05/18 183 lb 9.6 oz (83.3 kg)  04/02/18 181 lb (82.1 kg)  01/19/18 181 lb 1.3 oz (82.1 kg)       No flowsheet data found.    ASSESSMENT AND PLAN:  1.  Peripheral arterial disease with nonhealing ulceration on the bottom of the right big toe: This has been present for more than 1 year in spite of aggressive wound care.  She does have peripheral neuropathy and also has deformity in that foot which is the likely culprit.  There are plans for further surgery on December 9.  Based on her noninvasive vascular studies, I explained to her that her circulation is not severely abnormal but at the same time it might not be optimal enough to allow wound healing.  Her toe pressure is significantly reduced with monophasic waveforms and she has an occluded posterior tibial artery.  ABI was falsely elevated due to calcified vessels.  The best way to evaluate her circulation is to proceed with angiography with possible endovascular intervention.  I discussed the procedure in details as well as risks and benefits. I also discussed the case with Dr. Fritzi Mandes.  2.  Hyperlipidemia: I do not have any previous lipid profile but even if she has normal lipids, we should consider treatment with a statin given that she is diabetic with peripheral arterial disease.   Disposition:   FU with me in 1 month  Signed,  Kathlyn Sacramento, MD  04/07/2018 10:41 AM    [place]

## 2018-04-06 LAB — CBC WITH DIFFERENTIAL/PLATELET
BASOS: 1 %
Basophils Absolute: 0.1 10*3/uL (ref 0.0–0.2)
EOS (ABSOLUTE): 0.2 10*3/uL (ref 0.0–0.4)
EOS: 3 %
HEMATOCRIT: 37.5 % (ref 34.0–46.6)
HEMOGLOBIN: 11.8 g/dL (ref 11.1–15.9)
IMMATURE GRANS (ABS): 0 10*3/uL (ref 0.0–0.1)
IMMATURE GRANULOCYTES: 1 %
LYMPHS: 15 %
Lymphocytes Absolute: 0.9 10*3/uL (ref 0.7–3.1)
MCH: 26.3 pg — ABNORMAL LOW (ref 26.6–33.0)
MCHC: 31.5 g/dL (ref 31.5–35.7)
MCV: 84 fL (ref 79–97)
MONOCYTES: 7 %
Monocytes Absolute: 0.4 10*3/uL (ref 0.1–0.9)
NEUTROS PCT: 73 %
Neutrophils Absolute: 4.3 10*3/uL (ref 1.4–7.0)
Platelets: 214 10*3/uL (ref 150–450)
RBC: 4.49 x10E6/uL (ref 3.77–5.28)
RDW: 14.3 % (ref 12.3–15.4)
WBC: 5.9 10*3/uL (ref 3.4–10.8)

## 2018-04-06 LAB — BASIC METABOLIC PANEL
BUN/Creatinine Ratio: 23 (ref 12–28)
BUN: 15 mg/dL (ref 8–27)
CALCIUM: 9.6 mg/dL (ref 8.7–10.3)
CO2: 21 mmol/L (ref 20–29)
CREATININE: 0.65 mg/dL (ref 0.57–1.00)
Chloride: 105 mmol/L (ref 96–106)
GFR calc Af Amer: 92 mL/min/{1.73_m2} (ref >59)
GFR, EST NON AFRICAN AMERICAN: 80 mL/min/{1.73_m2} (ref >59)
Glucose: 105 mg/dL — ABNORMAL HIGH (ref 65–99)
Potassium: 4.7 mmol/L (ref 3.5–5.2)
Sodium: 142 mmol/L (ref 134–144)

## 2018-04-11 ENCOUNTER — Telehealth: Payer: Self-pay | Admitting: *Deleted

## 2018-04-11 NOTE — Telephone Encounter (Signed)
Pt contacted pre-PV procedure  scheduled at [facility] for: Wednesday April 13, 2018 12:30 PM Verified arrival time and place: [place] Entrance A at: 10:30 AM  No solid food after midnight prior to cath, clear liquids until 5 AM day of procedure. Contrast allergy: no  Hold: Metformin-day of procedure and 48 hours post procedure.  Except hold medication AM meds can be  taken pre-cath with sip of water including: ASA 81 mg  Confirmed patient has responsible person to drive home post procedure and for 24 hours after you arrive home: yes

## 2018-04-12 DIAGNOSIS — M9905 Segmental and somatic dysfunction of pelvic region: Secondary | ICD-10-CM | POA: Diagnosis not present

## 2018-04-12 DIAGNOSIS — M545 Low back pain: Secondary | ICD-10-CM | POA: Diagnosis not present

## 2018-04-13 ENCOUNTER — Encounter (HOSPITAL_COMMUNITY)
Admission: RE | Disposition: A | Discharge status: Home / Self Care | Payer: Self-pay | Source: Ambulatory Visit | Attending: Cardiovascular Disease

## 2018-04-13 ENCOUNTER — Other Ambulatory Visit: Payer: Self-pay

## 2018-04-13 ENCOUNTER — Ambulatory Visit (HOSPITAL_COMMUNITY)
Admission: RE | Admit: 2018-04-13 | Discharge: 2018-04-13 | Disposition: A | Discharge status: Home / Self Care | Payer: Medicare Other | Source: Ambulatory Visit | Attending: Cardiovascular Disease | Admitting: Cardiovascular Disease

## 2018-04-13 DIAGNOSIS — E11621 Type 2 diabetes mellitus with foot ulcer: Secondary | ICD-10-CM | POA: Diagnosis not present

## 2018-04-13 DIAGNOSIS — Z79899 Other long term (current) drug therapy: Secondary | ICD-10-CM | POA: Diagnosis not present

## 2018-04-13 DIAGNOSIS — I739 Peripheral vascular disease, unspecified: Secondary | ICD-10-CM

## 2018-04-13 DIAGNOSIS — E1142 Type 2 diabetes mellitus with diabetic polyneuropathy: Secondary | ICD-10-CM | POA: Insufficient documentation

## 2018-04-13 DIAGNOSIS — Z7984 Long term (current) use of oral hypoglycemic drugs: Secondary | ICD-10-CM | POA: Insufficient documentation

## 2018-04-13 DIAGNOSIS — Z9049 Acquired absence of other specified parts of digestive tract: Secondary | ICD-10-CM | POA: Insufficient documentation

## 2018-04-13 DIAGNOSIS — Z8249 Family history of ischemic heart disease and other diseases of the circulatory system: Secondary | ICD-10-CM | POA: Insufficient documentation

## 2018-04-13 DIAGNOSIS — L97511 Non-pressure chronic ulcer of other part of right foot limited to breakdown of skin: Secondary | ICD-10-CM | POA: Diagnosis not present

## 2018-04-13 DIAGNOSIS — L97519 Non-pressure chronic ulcer of other part of right foot with unspecified severity: Secondary | ICD-10-CM | POA: Diagnosis not present

## 2018-04-13 DIAGNOSIS — Z9841 Cataract extraction status, right eye: Secondary | ICD-10-CM | POA: Diagnosis not present

## 2018-04-13 DIAGNOSIS — Z9842 Cataract extraction status, left eye: Secondary | ICD-10-CM | POA: Insufficient documentation

## 2018-04-13 DIAGNOSIS — Z9889 Other specified postprocedural states: Secondary | ICD-10-CM | POA: Insufficient documentation

## 2018-04-13 DIAGNOSIS — Z791 Long term (current) use of non-steroidal anti-inflammatories (NSAID): Secondary | ICD-10-CM | POA: Insufficient documentation

## 2018-04-13 DIAGNOSIS — Z881 Allergy status to other antibiotic agents status: Secondary | ICD-10-CM | POA: Insufficient documentation

## 2018-04-13 DIAGNOSIS — Z853 Personal history of malignant neoplasm of breast: Secondary | ICD-10-CM | POA: Insufficient documentation

## 2018-04-13 DIAGNOSIS — Z833 Family history of diabetes mellitus: Secondary | ICD-10-CM | POA: Insufficient documentation

## 2018-04-13 DIAGNOSIS — E785 Hyperlipidemia, unspecified: Secondary | ICD-10-CM | POA: Insufficient documentation

## 2018-04-13 DIAGNOSIS — Z823 Family history of stroke: Secondary | ICD-10-CM | POA: Diagnosis not present

## 2018-04-13 DIAGNOSIS — E1121 Type 2 diabetes mellitus with diabetic nephropathy: Secondary | ICD-10-CM | POA: Diagnosis not present

## 2018-04-13 DIAGNOSIS — I70235 Atherosclerosis of native arteries of right leg with ulceration of other part of foot: Secondary | ICD-10-CM | POA: Insufficient documentation

## 2018-04-13 DIAGNOSIS — M1990 Unspecified osteoarthritis, unspecified site: Secondary | ICD-10-CM | POA: Insufficient documentation

## 2018-04-13 DIAGNOSIS — E1151 Type 2 diabetes mellitus with diabetic peripheral angiopathy without gangrene: Secondary | ICD-10-CM | POA: Insufficient documentation

## 2018-04-13 DIAGNOSIS — I7092 Chronic total occlusion of artery of the extremities: Secondary | ICD-10-CM | POA: Diagnosis not present

## 2018-04-13 DIAGNOSIS — Z7982 Long term (current) use of aspirin: Secondary | ICD-10-CM | POA: Diagnosis not present

## 2018-04-13 DIAGNOSIS — I998 Other disorder of circulatory system: Secondary | ICD-10-CM | POA: Diagnosis not present

## 2018-04-13 DIAGNOSIS — M069 Rheumatoid arthritis, unspecified: Secondary | ICD-10-CM | POA: Diagnosis not present

## 2018-04-13 DIAGNOSIS — Z888 Allergy status to other drugs, medicaments and biological substances status: Secondary | ICD-10-CM | POA: Diagnosis not present

## 2018-04-13 DIAGNOSIS — Z87891 Personal history of nicotine dependence: Secondary | ICD-10-CM | POA: Insufficient documentation

## 2018-04-13 HISTORY — PX: PERIPHERAL VASCULAR BALLOON ANGIOPLASTY: CATH118281

## 2018-04-13 HISTORY — PX: ABDOMINAL AORTOGRAM W/LOWER EXTREMITY: CATH118223

## 2018-04-13 LAB — GLUCOSE, CAPILLARY
GLUCOSE-CAPILLARY: 105 mg/dL — AB (ref 70–99)
GLUCOSE-CAPILLARY: 86 mg/dL (ref 70–99)

## 2018-04-13 LAB — POCT ACTIVATED CLOTTING TIME: Activated Clotting Time: 224 seconds

## 2018-04-13 SURGERY — ABDOMINAL AORTOGRAM W/LOWER EXTREMITY
Anesthesia: LOCAL

## 2018-04-13 MED ORDER — ONDANSETRON HCL 4 MG/2ML IJ SOLN: 4.0000 mg | Freq: Four times a day (QID) | INTRAMUSCULAR | Status: DC | PRN

## 2018-04-13 MED ORDER — ACETAMINOPHEN 325 MG PO TABS: 650.0000 mg | ORAL_TABLET | ORAL | Status: DC | PRN

## 2018-04-13 MED ORDER — HYDRALAZINE HCL 20 MG/ML IJ SOLN
INTRAMUSCULAR | Status: DC | PRN
  Administered 2018-04-13: 10 mg via INTRAVENOUS

## 2018-04-13 MED ORDER — HEPARIN SODIUM (PORCINE) 1000 UNIT/ML IJ SOLN
INTRAMUSCULAR | Status: DC | PRN
  Administered 2018-04-13: 6000 [IU] via INTRAVENOUS
  Administered 2018-04-13 (×2): 2000 [IU] via INTRAVENOUS

## 2018-04-13 MED ORDER — HYDRALAZINE HCL 20 MG/ML IJ SOLN: 5.0000 mg | INTRAMUSCULAR | Status: DC | PRN

## 2018-04-13 MED ORDER — HEPARIN SODIUM (PORCINE) 1000 UNIT/ML IJ SOLN
INTRAMUSCULAR | Status: AC
  Filled 2018-04-13: qty 1

## 2018-04-13 MED ORDER — ASPIRIN 81 MG PO CHEW: 81.0000 mg | CHEWABLE_TABLET | ORAL | Status: DC

## 2018-04-13 MED ORDER — MIDAZOLAM HCL 2 MG/2ML IJ SOLN
INTRAMUSCULAR | Status: DC | PRN
  Administered 2018-04-13: 1 mg via INTRAVENOUS

## 2018-04-13 MED ORDER — HEPARIN (PORCINE) IN NACL 1000-0.9 UT/500ML-% IV SOLN
INTRAVENOUS | Status: DC | PRN
  Administered 2018-04-13 (×2): 500 mL

## 2018-04-13 MED ORDER — HEPARIN (PORCINE) IN NACL 1000-0.9 UT/500ML-% IV SOLN
INTRAVENOUS | Status: AC
  Filled 2018-04-13: qty 1000

## 2018-04-13 MED ORDER — CLOPIDOGREL BISULFATE 300 MG PO TABS
ORAL_TABLET | ORAL | Status: DC | PRN
  Administered 2018-04-13: 300 mg via ORAL

## 2018-04-13 MED ORDER — SODIUM CHLORIDE 0.9 % IV SOLN: 250.0000 mL | INTRAVENOUS | Status: DC | PRN

## 2018-04-13 MED ORDER — MIDAZOLAM HCL 2 MG/2ML IJ SOLN
INTRAMUSCULAR | Status: AC
  Filled 2018-04-13: qty 2

## 2018-04-13 MED ORDER — SODIUM CHLORIDE 0.9% FLUSH: 3.0000 mL | Freq: Two times a day (BID) | INTRAVENOUS | Status: DC

## 2018-04-13 MED ORDER — SODIUM CHLORIDE 0.9 % WEIGHT BASED INFUSION
3.0000 mL/kg/h | INTRAVENOUS | Status: AC
  Administered 2018-04-13: 3 mL/kg/h via INTRAVENOUS

## 2018-04-13 MED ORDER — SODIUM CHLORIDE 0.9% FLUSH: 3.0000 mL | INTRAVENOUS | Status: DC | PRN

## 2018-04-13 MED ORDER — CLOPIDOGREL BISULFATE 75 MG PO TABS: 75.0000 mg | ORAL_TABLET | Freq: Every day | ORAL | 3 refills | Status: DC

## 2018-04-13 MED ORDER — LIDOCAINE HCL (PF) 1 % IJ SOLN
INTRAMUSCULAR | Status: DC | PRN
  Administered 2018-04-13: 15 mL via INTRADERMAL

## 2018-04-13 MED ORDER — ATORVASTATIN CALCIUM 10 MG PO TABS: 10.0000 mg | ORAL_TABLET | Freq: Every day | ORAL | 6 refills | Status: DC

## 2018-04-13 MED ORDER — LIDOCAINE HCL (PF) 1 % IJ SOLN
INTRAMUSCULAR | Status: AC
  Filled 2018-04-13: qty 30

## 2018-04-13 MED ORDER — IODIXANOL 320 MG/ML IV SOLN
INTRAVENOUS | Status: DC | PRN
  Administered 2018-04-13: 160 mL via INTRA_ARTERIAL

## 2018-04-13 MED ORDER — SODIUM CHLORIDE 0.9 % WEIGHT BASED INFUSION: 1.0000 mL/kg/h | INTRAVENOUS | Status: DC

## 2018-04-13 MED ORDER — HYDRALAZINE HCL 20 MG/ML IJ SOLN
INTRAMUSCULAR | Status: AC
  Filled 2018-04-13: qty 1

## 2018-04-13 MED ORDER — SODIUM CHLORIDE 0.9 % IV SOLN: INTRAVENOUS | Status: DC

## 2018-04-13 SURGICAL SUPPLY — 24 items
BALLN COYOTE OTW 3X60X150 (BALLOONS) ×3
BALLOON COYOTE OTW 3X60X150 (BALLOONS) ×2 IMPLANT
CATH ANGIO 5F PIGTAIL 65CM (CATHETERS) ×3 IMPLANT
CATH CROSS OVER TEMPO 5F (CATHETERS) ×6 IMPLANT
CATH CXI 2.3F 135 ANG 2 (CATHETERS) ×3 IMPLANT
CATH STRAIGHT 5FR 65CM (CATHETERS) ×3 IMPLANT
CLOSURE MYNX CONTROL 6F/7F (Vascular Products) ×3 IMPLANT
KIT ENCORE 26 ADVANTAGE (KITS) ×3 IMPLANT
KIT MICROPUNCTURE NIT STIFF (SHEATH) ×3 IMPLANT
KIT PV (KITS) ×3 IMPLANT
SHEATH PINNACLE 5F 10CM (SHEATH) ×6 IMPLANT
SHEATH PINNACLE 6F 10CM (SHEATH) ×3 IMPLANT
SHEATH PINNACLE ST 6F 45CM (SHEATH) ×3 IMPLANT
SHEATH PROBE COVER 6X72 (BAG) ×3 IMPLANT
SHIELD RADPAD SCOOP 12X17 (MISCELLANEOUS) ×3 IMPLANT
STOPCOCK MORSE 400PSI 3WAY (MISCELLANEOUS) ×3 IMPLANT
SYR MEDRAD MARK 7 150ML (SYRINGE) ×3 IMPLANT
TAPE VIPERTRACK RADIOPAQ (MISCELLANEOUS) ×2 IMPLANT
TAPE VIPERTRACK RADIOPAQUE (MISCELLANEOUS) ×1
TRANSDUCER W/STOPCOCK (MISCELLANEOUS) ×3 IMPLANT
TRAY PV CATH (CUSTOM PROCEDURE TRAY) ×3 IMPLANT
TUBING CIL FLEX 10 FLL-RA (TUBING) ×3 IMPLANT
WIRE HITORQ VERSACORE ST 145CM (WIRE) ×3 IMPLANT
WIRE RUNTHROUGH .014X300CM (WIRE) ×3 IMPLANT

## 2018-04-13 NOTE — Discharge Instructions (Signed)
Femoral Site Care Refer to this sheet in the next few weeks. These instructions provide you with information about caring for yourself after your procedure. Your health care provider may also give you more specific instructions. Your treatment has been planned according to current medical practices, but problems sometimes occur. Call your health care provider if you have any problems or questions after your procedure. What can I expect after the procedure? After your procedure, it is typical to have the following:  Bruising at the site that usually fades within 1-2 weeks.  Blood collecting in the tissue (hematoma) that may be painful to the touch. It should usually decrease in size and tenderness within 1-2 weeks.  Follow these instructions at home:  Take medicines only as directed by your health care provider.  You may shower 24-48 hours after the procedure or as directed by your health care provider. Remove the bandage (dressing) and gently wash the site with plain soap and water. Pat the area dry with a clean towel. Do not rub the site, because this may cause bleeding.  Do not take baths, swim, or use a hot tub until your health care provider approves.  Check your insertion site every day for redness, swelling, or drainage.  Do not apply powder or lotion to the site.  Limit use of stairs to twice a day for the first 2-3 days or as directed by your health care provider.  Do not squat for the first 2-3 days or as directed by your health care provider.  Do not lift over 10 lb (4.5 kg) for 5 days after your procedure or as directed by your health care provider.  Ask your health care provider when it is okay to: ? Return to work or school. ? Resume usual physical activities or sports. ? Resume sexual activity.  Do not drive home if you are discharged the same day as the procedure. Have someone else drive you.  You may drive 24 hours after the procedure unless otherwise instructed by  your health care provider.  Do not operate machinery or power tools for 24 hours after the procedure or as directed by your health care provider.  If your procedure was done as an outpatient procedure, which means that you went home the same day as your procedure, a responsible adult should be with you for the first 24 hours after you arrive home.  Keep all follow-up visits as directed by your health care provider. This is important. Contact a health care provider if:  You have a fever.  You have chills.  You have increased bleeding from the site. Hold pressure on the site. Get help right away if:  You have unusual pain at the site.  You have redness, warmth, or swelling at the site.  You have drainage (other than a small amount of blood on the dressing) from the site.  The site is bleeding, and the bleeding does not stop after 30 minutes of holding steady pressure on the site.  Your leg or foot becomes pale, cool, tingly, or numb. This information is not intended to replace advice given to you by your health care provider. Make sure you discuss any questions you have with your health care provider. Document Released: 12/29/2013 Document Revised: 10/03/2015 Document Reviewed: 11/14/2013 Elsevier Interactive Patient Education  2018 [place]     Moderate Conscious Sedation, Adult, Care After These instructions provide you with information about caring for yourself after your procedure. Your health care provider may  also give you more specific instructions. Your treatment has been planned according to current medical practices, but problems sometimes occur. Call your health care provider if you have any problems or questions after your procedure. What can I expect after the procedure? After your procedure, it is common:  To feel sleepy for several hours.  To feel clumsy and have poor balance for several hours.  To have poor judgment for several hours.  To vomit if you eat  too soon.  Follow these instructions at home: For at least 24 hours after the procedure:   Do not: ? Participate in activities where you could fall or become injured. ? Drive. ? Use heavy machinery. ? Drink alcohol. ? Take sleeping pills or medicines that cause drowsiness. ? Make important decisions or sign legal documents. ? Take care of children on your own.  Rest. Eating and drinking  Follow the diet recommended by your health care provider.  If you vomit: ? Drink water, juice, or soup when you can drink without vomiting. ? Make sure you have little or no nausea before eating solid foods. General instructions  Have a responsible adult stay with you until you are awake and alert.  Take over-the-counter and prescription medicines only as told by your health care provider.  If you smoke, do not smoke without supervision.  Keep all follow-up visits as told by your health care provider. This is important. Contact a health care provider if:  You keep feeling nauseous or you keep vomiting.  You feel light-headed.  You develop a rash.  You have a fever. Get help right away if:  You have trouble breathing. This information is not intended to replace advice given to you by your health care provider. Make sure you discuss any questions you have with your health care provider. Document Released: 02/15/2013 Document Revised: 09/30/2015 Document Reviewed: 08/17/2015 Elsevier Interactive Patient Education  2018 [place] taking clopidogrel (Plavix 75 mg) once daily. Start taking atorvastatin 10 mg once daily.  Prescriptions for both were sent to your [facility] pharmacy.

## 2018-04-13 NOTE — Interval H&P Note (Signed)
History and Physical Interval Note:  04/13/2018 1:31 PM  Jessica Russell  has presented today for surgery, with the diagnosis of ulceration - pad  The various methods of treatment have been discussed with the patient and family. After consideration of risks, benefits and other options for treatment, the patient has consented to  Procedure(s): ABDOMINAL AORTOGRAM W/LOWER EXTREMITY (N/A) as a surgical intervention .  The patient's history has been reviewed, patient examined, no change in status, stable for surgery.  I have reviewed the patient's chart and labs.  Questions were answered to the patient's satisfaction.     Kathlyn Sacramento

## 2018-04-13 NOTE — Progress Notes (Signed)
Client up and walked to bathroom and tolerated well; left groin stable, no bleeding or hematoma; client voiced understanding to hold metformin until Saturday

## 2018-04-14 ENCOUNTER — Other Ambulatory Visit: Payer: Self-pay

## 2018-04-14 ENCOUNTER — Encounter (HOSPITAL_COMMUNITY): Payer: Self-pay | Admitting: Cardiovascular Disease

## 2018-04-14 NOTE — Progress Notes (Addendum)
Spoke w/ pt via phone for pre-op interview.  Npo after mn w/ exception clear liquids until 0700 (no cream/ milk products).  Arrive at 1100.  Current lab results dated 04-05-2018 (CBCdiff, BMP) in chart and epic.  Current ekg in chart and epic.  Will take gabapentin am dos w/ sips of water.  Received pt pcp H&P dated 03-28-2018 via fax from dr Fritzi Mandes office, placed with chart.

## 2018-04-18 ENCOUNTER — Encounter (HOSPITAL_BASED_OUTPATIENT_CLINIC_OR_DEPARTMENT_OTHER)
Admission: RE | Disposition: A | Discharge status: Home / Self Care | Payer: Self-pay | Source: Ambulatory Visit | Attending: Podiatry

## 2018-04-18 ENCOUNTER — Ambulatory Visit (HOSPITAL_BASED_OUTPATIENT_CLINIC_OR_DEPARTMENT_OTHER)
Admission: RE | Admit: 2018-04-18 | Discharge: 2018-04-18 | Disposition: A | Discharge status: Home / Self Care | Payer: Medicare Other | Source: Ambulatory Visit | Attending: Podiatry | Admitting: Podiatry

## 2018-04-18 ENCOUNTER — Encounter (HOSPITAL_BASED_OUTPATIENT_CLINIC_OR_DEPARTMENT_OTHER): Payer: Self-pay | Admitting: *Deleted

## 2018-04-18 ENCOUNTER — Ambulatory Visit (HOSPITAL_BASED_OUTPATIENT_CLINIC_OR_DEPARTMENT_OTHER): Payer: Medicare Other | Admitting: Anesthesiology

## 2018-04-18 ENCOUNTER — Other Ambulatory Visit: Payer: Self-pay | Admitting: *Deleted

## 2018-04-18 DIAGNOSIS — Z7902 Long term (current) use of antithrombotics/antiplatelets: Secondary | ICD-10-CM | POA: Diagnosis not present

## 2018-04-18 DIAGNOSIS — Z923 Personal history of irradiation: Secondary | ICD-10-CM | POA: Insufficient documentation

## 2018-04-18 DIAGNOSIS — L97519 Non-pressure chronic ulcer of other part of right foot with unspecified severity: Secondary | ICD-10-CM | POA: Insufficient documentation

## 2018-04-18 DIAGNOSIS — E11621 Type 2 diabetes mellitus with foot ulcer: Secondary | ICD-10-CM | POA: Diagnosis not present

## 2018-04-18 DIAGNOSIS — Z6831 Body mass index (BMI) 31.0-31.9, adult: Secondary | ICD-10-CM | POA: Insufficient documentation

## 2018-04-18 DIAGNOSIS — Z853 Personal history of malignant neoplasm of breast: Secondary | ICD-10-CM | POA: Diagnosis not present

## 2018-04-18 DIAGNOSIS — E669 Obesity, unspecified: Secondary | ICD-10-CM | POA: Diagnosis not present

## 2018-04-18 DIAGNOSIS — Z79899 Other long term (current) drug therapy: Secondary | ICD-10-CM | POA: Diagnosis not present

## 2018-04-18 DIAGNOSIS — Z7984 Long term (current) use of oral hypoglycemic drugs: Secondary | ICD-10-CM | POA: Insufficient documentation

## 2018-04-18 DIAGNOSIS — E1142 Type 2 diabetes mellitus with diabetic polyneuropathy: Secondary | ICD-10-CM | POA: Insufficient documentation

## 2018-04-18 DIAGNOSIS — E1151 Type 2 diabetes mellitus with diabetic peripheral angiopathy without gangrene: Secondary | ICD-10-CM | POA: Diagnosis not present

## 2018-04-18 DIAGNOSIS — M216X1 Other acquired deformities of right foot: Secondary | ICD-10-CM | POA: Diagnosis not present

## 2018-04-18 DIAGNOSIS — E785 Hyperlipidemia, unspecified: Secondary | ICD-10-CM | POA: Diagnosis not present

## 2018-04-18 DIAGNOSIS — I739 Peripheral vascular disease, unspecified: Secondary | ICD-10-CM

## 2018-04-18 DIAGNOSIS — M810 Age-related osteoporosis without current pathological fracture: Secondary | ICD-10-CM | POA: Insufficient documentation

## 2018-04-18 DIAGNOSIS — M21961 Unspecified acquired deformity of right lower leg: Secondary | ICD-10-CM | POA: Insufficient documentation

## 2018-04-18 DIAGNOSIS — Z87891 Personal history of nicotine dependence: Secondary | ICD-10-CM | POA: Insufficient documentation

## 2018-04-18 DIAGNOSIS — L97512 Non-pressure chronic ulcer of other part of right foot with fat layer exposed: Secondary | ICD-10-CM | POA: Diagnosis not present

## 2018-04-18 HISTORY — PX: GRAFT APPLICATION: SHX6696

## 2018-04-18 HISTORY — PX: METATARSAL HEAD EXCISION: SHX5027

## 2018-04-18 HISTORY — DX: Type 2 diabetes mellitus without complications: E11.9

## 2018-04-18 HISTORY — DX: Disorder of arteries and arterioles, unspecified: I77.9

## 2018-04-18 HISTORY — DX: Personal history of irradiation: Z92.3

## 2018-04-18 HISTORY — DX: Personal history of malignant neoplasm of breast: Z85.3

## 2018-04-18 HISTORY — DX: Personal history of other diseases of the digestive system: Z87.19

## 2018-04-18 HISTORY — DX: Unspecified osteoarthritis, unspecified site: M19.90

## 2018-04-18 HISTORY — DX: Non-pressure chronic ulcer of other part of right foot with fat layer exposed: L97.512

## 2018-04-18 LAB — GLUCOSE, CAPILLARY
Glucose-Capillary: 112 mg/dL — ABNORMAL HIGH (ref 70–99)
Glucose-Capillary: 105 mg/dL — ABNORMAL HIGH (ref 70–99)

## 2018-04-18 SURGERY — EXCISION, METATARSAL BONE, HEAD
Anesthesia: General

## 2018-04-18 MED ORDER — LIDOCAINE 2% (20 MG/ML) 5 ML SYRINGE
INTRAMUSCULAR | Status: DC | PRN
  Administered 2018-04-18: 60 mg via INTRAVENOUS

## 2018-04-18 MED ORDER — OXYCODONE-ACETAMINOPHEN 5-325 MG PO TABS
1.0000 | ORAL_TABLET | ORAL | Status: DC | PRN
  Filled 2018-04-18: qty 2

## 2018-04-18 MED ORDER — OXYCODONE HCL 5 MG PO TABS
5.0000 mg | ORAL_TABLET | Freq: Once | ORAL | Status: DC | PRN
  Filled 2018-04-18: qty 1

## 2018-04-18 MED ORDER — OXYCODONE HCL 5 MG/5ML PO SOLN
5.0000 mg | Freq: Once | ORAL | Status: DC | PRN
  Filled 2018-04-18: qty 5

## 2018-04-18 MED ORDER — PHENYLEPHRINE 40 MCG/ML (10ML) SYRINGE FOR IV PUSH (FOR BLOOD PRESSURE SUPPORT)
PREFILLED_SYRINGE | INTRAVENOUS | Status: AC
  Filled 2018-04-18: qty 10

## 2018-04-18 MED ORDER — BUPIVACAINE HCL 0.5 % IJ SOLN
INTRAMUSCULAR | Status: DC | PRN
  Administered 2018-04-18: 10 mL

## 2018-04-18 MED ORDER — FENTANYL CITRATE (PF) 100 MCG/2ML IJ SOLN
25.0000 ug | INTRAMUSCULAR | Status: DC | PRN
  Filled 2018-04-18: qty 1

## 2018-04-18 MED ORDER — EPHEDRINE 5 MG/ML INJ
INTRAVENOUS | Status: AC
  Filled 2018-04-18: qty 10

## 2018-04-18 MED ORDER — CHLORHEXIDINE GLUCONATE CLOTH 2 % EX PADS
6.0000 | MEDICATED_PAD | Freq: Once | CUTANEOUS | Status: DC
  Filled 2018-04-18: qty 6

## 2018-04-18 MED ORDER — CEFAZOLIN SODIUM-DEXTROSE 2-4 GM/100ML-% IV SOLN
2.0000 g | INTRAVENOUS | Status: AC
  Administered 2018-04-18: 2 g via INTRAVENOUS
  Filled 2018-04-18: qty 100

## 2018-04-18 MED ORDER — LACTATED RINGERS IV SOLN
INTRAVENOUS | Status: DC
  Administered 2018-04-18: 12:00:00 via INTRAVENOUS
  Filled 2018-04-18: qty 1000

## 2018-04-18 MED ORDER — ONDANSETRON HCL 4 MG/2ML IJ SOLN
INTRAMUSCULAR | Status: DC | PRN
  Administered 2018-04-18: 4 mg via INTRAVENOUS

## 2018-04-18 MED ORDER — EPHEDRINE SULFATE-NACL 50-0.9 MG/10ML-% IV SOSY
PREFILLED_SYRINGE | INTRAVENOUS | Status: DC | PRN
  Administered 2018-04-18 (×2): 5 mg via INTRAVENOUS

## 2018-04-18 MED ORDER — SODIUM CHLORIDE 0.9% FLUSH
3.0000 mL | Freq: Two times a day (BID) | INTRAVENOUS | Status: DC
  Filled 2018-04-18: qty 3

## 2018-04-18 MED ORDER — PROPOFOL 10 MG/ML IV BOLUS
INTRAVENOUS | Status: AC
  Filled 2018-04-18: qty 20

## 2018-04-18 MED ORDER — LIDOCAINE HCL 2 % IJ SOLN
INTRAMUSCULAR | Status: DC | PRN
  Administered 2018-04-18: 10 mL

## 2018-04-18 MED ORDER — CEFAZOLIN SODIUM-DEXTROSE 2-4 GM/100ML-% IV SOLN
INTRAVENOUS | Status: AC
  Filled 2018-04-18: qty 100

## 2018-04-18 MED ORDER — SODIUM CHLORIDE 0.9 % IV SOLN
250.0000 mL | INTRAVENOUS | Status: DC | PRN
  Filled 2018-04-18: qty 250

## 2018-04-18 MED ORDER — PROPOFOL 10 MG/ML IV BOLUS
INTRAVENOUS | Status: DC | PRN
  Administered 2018-04-18: 100 mg via INTRAVENOUS

## 2018-04-18 MED ORDER — FENTANYL CITRATE (PF) 100 MCG/2ML IJ SOLN
INTRAMUSCULAR | Status: DC | PRN
  Administered 2018-04-18: 25 ug via INTRAVENOUS

## 2018-04-18 MED ORDER — PHENYLEPHRINE 40 MCG/ML (10ML) SYRINGE FOR IV PUSH (FOR BLOOD PRESSURE SUPPORT)
PREFILLED_SYRINGE | INTRAVENOUS | Status: DC | PRN
  Administered 2018-04-18 (×4): 40 ug via INTRAVENOUS
  Administered 2018-04-18: 80 ug via INTRAVENOUS
  Administered 2018-04-18 (×11): 40 ug via INTRAVENOUS
  Administered 2018-04-18: 80 ug via INTRAVENOUS
  Administered 2018-04-18: 40 ug via INTRAVENOUS

## 2018-04-18 MED ORDER — LIDOCAINE 2% (20 MG/ML) 5 ML SYRINGE
INTRAMUSCULAR | Status: AC
  Filled 2018-04-18: qty 5

## 2018-04-18 MED ORDER — FENTANYL CITRATE (PF) 100 MCG/2ML IJ SOLN
INTRAMUSCULAR | Status: AC
  Filled 2018-04-18: qty 2

## 2018-04-18 MED ORDER — ONDANSETRON HCL 4 MG/2ML IJ SOLN
4.0000 mg | Freq: Once | INTRAMUSCULAR | Status: DC | PRN
  Filled 2018-04-18: qty 2

## 2018-04-18 MED ORDER — SODIUM CHLORIDE 0.9% FLUSH
3.0000 mL | INTRAVENOUS | Status: DC | PRN
  Filled 2018-04-18: qty 3

## 2018-04-18 SURGICAL SUPPLY — 73 items
BANDAGE COBAN STERILE 2 (GAUZE/BANDAGES/DRESSINGS) ×2 IMPLANT
BENZOIN TINCTURE PRP APPL 2/3 (GAUZE/BANDAGES/DRESSINGS) ×2 IMPLANT
BLADE CCA MICRO SAG (BLADE) ×2 IMPLANT
BLADE OSC/SAG .038X5.5 CUT EDG (BLADE) IMPLANT
BLADE SAW SAGI 13.0X70X1.19 (BLADE) IMPLANT
BLADE SURG 10 STRL SS (BLADE) ×2 IMPLANT
BLADE SURG 15 STRL LF DISP TIS (BLADE) ×3 IMPLANT
BLADE SURG 15 STRL SS (BLADE) ×3
BNDG COHESIVE 4X5 TAN STRL (GAUZE/BANDAGES/DRESSINGS) ×2 IMPLANT
BNDG CONFORM 2 STRL LF (GAUZE/BANDAGES/DRESSINGS) ×2 IMPLANT
BNDG ESMARK 4X9 LF (GAUZE/BANDAGES/DRESSINGS) ×2 IMPLANT
BNDG GAUZE ELAST 4 BULKY (GAUZE/BANDAGES/DRESSINGS) ×2 IMPLANT
BUR SIDE CUT 44.8 STRL (BURR) IMPLANT
BURR SIDE CUT 44.8 STRL (BURR)
COVER BACK TABLE 60X90IN (DRAPES) ×2 IMPLANT
COVER MAYO STAND STRL (DRAPES) ×2 IMPLANT
COVER WAND RF STERILE (DRAPES) ×4 IMPLANT
CUFF TOURNIQUET SINGLE 18IN (TOURNIQUET CUFF) ×2 IMPLANT
DRAPE EXTREMITY T 121X128X90 (DRAPE) ×2 IMPLANT
DRAPE OEC MINIVIEW 54X84 (DRAPES) ×2 IMPLANT
DRAPE SURG 17X23 STRL (DRAPES) ×2 IMPLANT
DRSG EMULSION OIL 3X3 NADH (GAUZE/BANDAGES/DRESSINGS) ×2 IMPLANT
DURAPREP 26ML APPLICATOR (WOUND CARE) ×2 IMPLANT
ELECT REM PT RETURN 9FT ADLT (ELECTROSURGICAL) ×2
ELECTRODE REM PT RTRN 9FT ADLT (ELECTROSURGICAL) ×1 IMPLANT
GAUZE SPONGE 4X4 12PLY STRL (GAUZE/BANDAGES/DRESSINGS) ×2 IMPLANT
GAUZE XEROFORM 1X8 LF (GAUZE/BANDAGES/DRESSINGS) IMPLANT
GLOVE BIO SURGEON STRL SZ7.5 (GLOVE) ×8 IMPLANT
GLOVE BIOGEL PI IND STRL 7.5 (GLOVE) ×1 IMPLANT
GLOVE BIOGEL PI INDICATOR 7.5 (GLOVE) ×1
GOWN STRL REUS W/ TWL XL LVL3 (GOWN DISPOSABLE) ×2 IMPLANT
GOWN STRL REUS W/TWL XL LVL3 (GOWN DISPOSABLE) ×2
IMPL STRAVIX 3X6 (Tissue) ×1 IMPLANT
IMPLANT STRAVIX 3X6 (Tissue) ×2 IMPLANT
KIT TURNOVER CYSTO (KITS) ×2 IMPLANT
KWIRE 4.0 X .045IN (WIRE) IMPLANT
MANIFOLD NEPTUNE II (INSTRUMENTS) IMPLANT
MICRO SAGITAL BLADE ×2 IMPLANT
NDL SAFETY ECLIPSE 18X1.5 (NEEDLE) ×1 IMPLANT
NEEDLE 27GAX1X1/2 (NEEDLE) ×4 IMPLANT
NEEDLE HYPO 18GX1.5 SHARP (NEEDLE) ×1
NEEDLE HYPO 22GX1.5 SAFETY (NEEDLE) IMPLANT
PACK BASIN DAY SURGERY FS (CUSTOM PROCEDURE TRAY) ×2 IMPLANT
PADDING CAST ABS 4INX4YD NS (CAST SUPPLIES)
PADDING CAST ABS COTTON 4X4 ST (CAST SUPPLIES) IMPLANT
PADDING CAST SYNTHETIC 2 (CAST SUPPLIES)
PADDING CAST SYNTHETIC 2X4 NS (CAST SUPPLIES) IMPLANT
PADDING CAST SYNTHETIC 3 NS LF (CAST SUPPLIES)
PADDING CAST SYNTHETIC 3X4 NS (CAST SUPPLIES) IMPLANT
PASSER SUT SWANSON 36MM LOOP (INSTRUMENTS) IMPLANT
PENCIL BUTTON HOLSTER BLD 10FT (ELECTRODE) ×2 IMPLANT
SCOTCHCAST PLUS 2X4 WHITE (CAST SUPPLIES) IMPLANT
SCOTCHCAST PLUS 3X4 WHITE (CAST SUPPLIES) IMPLANT
SCOTCHCAST PLUS 4X4 WHITE (CAST SUPPLIES) IMPLANT
SMALL CROSSCUT RASP ×2 IMPLANT
STOCKINETTE 4X48 STRL (DRAPES) IMPLANT
STOCKINETTE 6  STRL (DRAPES) ×1
STOCKINETTE 6 STRL (DRAPES) ×1 IMPLANT
STOCKINETTE SYNTHETIC 4 NONSTR (MISCELLANEOUS) ×2 IMPLANT
STRIP CLOSURE SKIN 1/2X4 (GAUZE/BANDAGES/DRESSINGS) ×2 IMPLANT
SUT ETHILON 4 0 PS 2 18 (SUTURE) IMPLANT
SUT ETHILON 5 0 PS 2 18 (SUTURE) IMPLANT
SUT PROLENE 4 0 RB 1 (SUTURE) ×1
SUT PROLENE 4-0 RB1 .5 CRCL 36 (SUTURE) ×1 IMPLANT
SUT VIC AB 3-0 FS2 27 (SUTURE) ×2 IMPLANT
SUT VIC AB 4-0 PS2 27 (SUTURE) IMPLANT
SUT VIC AB 5-0 PS2 18 (SUTURE) IMPLANT
SUT VICRYL 4-0 PS2 18IN ABS (SUTURE) ×2 IMPLANT
SYR 3ML 18GX1 1/2 (SYRINGE) ×2 IMPLANT
SYR BULB 3OZ (MISCELLANEOUS) ×2 IMPLANT
SYR CONTROL 10ML LL (SYRINGE) ×4 IMPLANT
TUBE CONNECTING 12X1/4 (SUCTIONS) ×2 IMPLANT
UNDERPAD 30X30 (UNDERPADS AND DIAPERS) ×2 IMPLANT

## 2018-04-18 NOTE — Anesthesia Procedure Notes (Signed)
Procedure Name: LMA Insertion Date/Time: 04/18/2018 1:23 PM Performed by: [name], CRNA Pre-anesthesia Checklist: Patient identified, Emergency Drugs available, Suction available and Patient being monitored Patient Re-evaluated:Patient Re-evaluated prior to induction Oxygen Delivery Method: Circle system utilized Preoxygenation: Pre-oxygenation with 100% oxygen Induction Type: IV induction Ventilation: Mask ventilation without difficulty LMA: LMA inserted LMA Size: 4.0 Number of attempts: 1 Placement Confirmation: positive ETCO2 and breath sounds checked- equal and bilateral Tube secured with: Tape Dental Injury: Teeth and Oropharynx as per pre-operative assessment

## 2018-04-18 NOTE — Anesthesia Postprocedure Evaluation (Signed)
Anesthesia Post Note  Patient: Jessica Russell  Procedure(s) Performed: FIRST METATARSAL HEAD RECESSION, WOUND DEBRIEDMENT (N/A ) APPLICATION OF STRAVIX GRAFT (N/A )     Patient location during evaluation: PACU Anesthesia Type: General Level of consciousness: awake and alert Pain management: pain level controlled Vital Signs Assessment: post-procedure vital signs reviewed and stable Respiratory status: spontaneous breathing, nonlabored ventilation and respiratory function stable Cardiovascular status: blood pressure returned to baseline and stable Postop Assessment: no apparent nausea or vomiting Anesthetic complications: no    Last Vitals:  Vitals:   04/18/18 1545 04/18/18 1550  BP: (!) 152/87   Pulse: 73 69  Resp: 16 11  Temp:    SpO2: 100% 99%    Last Pain:  Vitals:   04/18/18 1550  TempSrc:   PainSc: 0-No pain        RLE Motor Response: Responds to commands (04/18/18 1600) RLE Sensation: Numbness (04/18/18 1600)      Audry Pili

## 2018-04-18 NOTE — Discharge Instructions (Signed)

## 2018-04-18 NOTE — Transfer of Care (Signed)
Immediate Anesthesia Transfer of Care Note  Patient: Jessica Russell  Procedure(s) Performed: FIRST METATARSAL HEAD RECESSION, WOUND DEBRIEDMENT (N/A ) APPLICATION OF STRAVIX GRAFT (N/A )  Patient Location: PACU  Anesthesia Type:General  Level of Consciousness: drowsy and patient cooperative  Airway & Oxygen Therapy: Patient Spontanous Breathing and Patient connected to face mask oxygen  Post-op Assessment: Report given to RN and Post -op Vital signs reviewed and stable  Post vital signs: Reviewed and stable  Last Vitals:  Vitals Value Taken Time  BP 165/83 04/18/2018  2:57 PM  Temp    Pulse 76 04/18/2018  2:58 PM  Resp    SpO2 100 % 04/18/2018  2:58 PM  Vitals shown include unvalidated device data.  Last Pain:  Vitals:   04/18/18 1108  TempSrc: Oral  PainSc: 5       Patients Stated Pain Goal: 5 (49/82/64 1583)  Complications: No apparent anesthesia complications

## 2018-04-18 NOTE — Anesthesia Preprocedure Evaluation (Addendum)
Anesthesia Evaluation  Patient identified by MRN, date of birth, ID band Patient awake    Reviewed: Allergy & Precautions, NPO status , Patient's Chart, lab work & pertinent test results  History of Anesthesia Complications Negative for: history of anesthetic complications  Airway Mallampati: II  TM Distance: >3 FB Neck ROM: Full    Dental  (+) Dental Advisory Given, Teeth Intact   Pulmonary former smoker,    breath sounds clear to auscultation       Cardiovascular + Peripheral Vascular Disease   Rhythm:Regular Rate:Normal     Neuro/Psych  Neuromuscular disease (neuropathy) negative psych ROS   GI/Hepatic negative GI ROS, Neg liver ROS,   Endo/Other  diabetes, Type 2, Oral Hypoglycemic Agents Obesity   Renal/GU negative Renal ROS     Musculoskeletal  (+) Arthritis ,   Abdominal   Peds  Hematology negative hematology ROS (+)   Anesthesia Other Findings   Reproductive/Obstetrics  Breast cancer s/p lumpectomy, radiation                             Anesthesia Physical Anesthesia Plan  ASA: III  Anesthesia Plan: General   Post-op Pain Management:    Induction: Intravenous  PONV Risk Score and Plan: 4 or greater and Treatment may vary due to age or medical condition, Ondansetron and Propofol infusion  Airway Management Planned: LMA  Additional Equipment: None  Intra-op Plan:   Post-operative Plan: Extubation in OR  Informed Consent: I have reviewed the patients History and Physical, chart, labs and discussed the procedure including the risks, benefits and alternatives for the proposed anesthesia with the patient or authorized representative who has indicated his/her understanding and acceptance.   Dental advisory given  Plan Discussed with: CRNA and Anesthesiologist  Anesthesia Plan Comments:        Anesthesia Quick Evaluation

## 2018-04-19 ENCOUNTER — Encounter (HOSPITAL_BASED_OUTPATIENT_CLINIC_OR_DEPARTMENT_OTHER): Payer: Self-pay | Admitting: Podiatry

## 2018-04-19 ENCOUNTER — Telehealth: Payer: Self-pay | Admitting: Cardiovascular Disease

## 2018-04-19 NOTE — Op Note (Signed)
NAMEHALCYON, HECK MEDICAL RECORD DU:[id number] ACCOUNT [id number] DATE OF BIRTH:1928/03/23 FACILITY: [facility] LOCATION: [facility] PHYSICIAN:Cayci Mcnabb, DPM  OPERATIVE REPORT  DATE OF PROCEDURE:  04/18/2018  SURGEON:  Twanna Hy. Fritzi Mandes, DPM  ASSISTANT:  None.  PREOPERATIVE DIAGNOSIS:  First metatarsal deformity with underlying first metatarsal head chronic ulceration, right foot.  POSTOPERATIVE DIAGNOSIS:  First metatarsal deformity with underlying first metatarsal head chronic ulceration, right foot.  PROCEDURE: 1.  First metatarsal head bone resection, right foot. 2.  Debridement and preparation of graft bed first metatarsal head and application of Stravix.  ANESTHESIA:  General.  COMPLICATIONS:  None.  DESCRIPTION OF PROCEDURE:  The patient was brought to the OR and placed in the supine position, at which time general anesthesia was administered.  The foot was prepped and draped in the usual aseptic manner.  The previously applied tourniquet was then inflated to 250 mmHg.  Attention was directed to the first ray, where a dorsal linear incision was made.  The incision was deepened via sharp and blunt modalities, taking care to clamp and cauterize all bleeding vessels and ensuring retraction of neurovascular structures encountered.  The deep and superficial fascia was separated medially and laterally the length of the incision.  Incision was deepened to the level of the first metatarsal head where a linear incision was made.  Soft tissue  structures and capsule were freed both dorsally, medially and laterally exposing the head of the first metatarsal and base of the proximal phalanx.  Once exposed, a sagittal saw was used to resect the head of the first metatarsal, taking care to do so with a slight oblique angle to not leave a prominence of bone plantarly.  The head of the first metatarsal was then resected in its entirety.  Then, attention was directed to the base of the  proximal phalanx, where the patient had an enlarged medial  condyle and this was excised using a sagittal saw.  These pieces of bone were sent separately for both pathology and bone cultures to assess.  The area was irrigated with copious amounts of sterile saline and antibiotic solution.  Deep closure was  accomplished after a piece of Stravix was applied in the first metatarsal head space.  Then the capsule was reapproximated with 3-0 Vicryl and deep closure accomplished with 4-0 Vicryl and skin closure with 4-0 nylon.  Next, attention was directed to the plantar aspect of the first metatarsal head where the patient has a chronic full thickness ulceration.  This was debrided to prepare for application of a Stravix graft.  The Stravix graft was fenestrated with a sharp  blade.  After the debridement, the wound bed was irrigated with copious amounts of sterile saline.  Then, the Stravix was carefully applied and anchored using 4-0 Prolene and 4-0 Vicryl.  Then, Adaptic was applied as well as Steri-Strips and a small  piece of Stravix was applied and the dorsal linear incision as well.  The foot was dressed then with 4 x 4, Kerlix and Coban.  The tourniquet was deflated and vascular status returned to all digits.    The patient was sent to the recovery room with vital signs stable and capillary refill time at presurgical levels.    Both written and oral postoperative instructions were given to the patient.  No guarantees were given.  All questions answered.  AN/NUANCE  D:04/19/2018 T:04/19/2018 JOB:[id number]

## 2018-04-19 NOTE — Telephone Encounter (Signed)
  Patient had stint procedure on 04/13/18 and she started taking the blood thinner on 04/18/18. This morning the patient started bleeding and has continued to have some bleeding throughout the day. Should she continue on the blood thinner as directed.

## 2018-04-19 NOTE — Telephone Encounter (Signed)
Returned call to patient of Dr. Fletcher Anon who had PV procedure 12/4 and 12/9 surgery (amputation). She states plavix yesterday. She reports bright red blood during urination, blood on her panty liner, but none most recently. She reports she did not have a urinary catheter during surgery yesterday, thus no trauma to GU system. She does not have a GU doctor she sees. Advised would notify MD for recommendations.

## 2018-04-19 NOTE — Telephone Encounter (Signed)
She can hold Plavix for 3 days and resume after that as long as the bleeding has stopped.

## 2018-04-20 NOTE — Telephone Encounter (Signed)
The patient has been made aware of Dr. Tyrell Antonio recommendation and verbalized her understanding. She will call on Monday if the bleeding has not stopped.

## 2018-04-21 DIAGNOSIS — M21961 Unspecified acquired deformity of right lower leg: Secondary | ICD-10-CM | POA: Diagnosis not present

## 2018-04-23 LAB — AEROBIC/ANAEROBIC CULTURE W GRAM STAIN (SURGICAL/DEEP WOUND)
Culture: NORMAL
Gram Stain: NONE SEEN

## 2018-04-26 LAB — AEROBIC/ANAEROBIC CULTURE W GRAM STAIN (SURGICAL/DEEP WOUND)
Culture: NORMAL
Gram Stain: NONE SEEN

## 2018-04-26 LAB — AEROBIC/ANAEROBIC CULTURE (SURGICAL/DEEP WOUND)

## 2018-04-28 ENCOUNTER — Ambulatory Visit (HOSPITAL_COMMUNITY)
Admission: RE | Admit: 2018-04-28 | Discharge: 2018-04-28 | Disposition: A | Discharge status: Home / Self Care | Payer: Medicare Other | Source: Ambulatory Visit | Attending: Cardiology | Admitting: Cardiology

## 2018-04-28 DIAGNOSIS — I739 Peripheral vascular disease, unspecified: Secondary | ICD-10-CM | POA: Diagnosis not present

## 2018-04-29 ENCOUNTER — Telehealth: Payer: Self-pay | Admitting: *Deleted

## 2018-04-29 DIAGNOSIS — I739 Peripheral vascular disease, unspecified: Secondary | ICD-10-CM

## 2018-04-29 NOTE — Telephone Encounter (Signed)
Patient made aware of results and verbalized understanding. Repeat orders placed.    

## 2018-04-29 NOTE — Telephone Encounter (Signed)
-----   Message from [name], MD sent at 04/29/2018  7:44 AM EST ----- Normal ABI with improved toe pressure after recent intervention.  Repeat studies in 6 months.  Keep follow-up with me in January.

## 2018-05-10 DIAGNOSIS — E559 Vitamin D deficiency, unspecified: Secondary | ICD-10-CM | POA: Diagnosis not present

## 2018-05-10 DIAGNOSIS — E119 Type 2 diabetes mellitus without complications: Secondary | ICD-10-CM | POA: Diagnosis not present

## 2018-05-18 ENCOUNTER — Encounter: Payer: Self-pay | Admitting: Neurology

## 2018-05-18 ENCOUNTER — Ambulatory Visit (INDEPENDENT_AMBULATORY_CARE_PROVIDER_SITE_OTHER): Payer: Medicare Other | Admitting: Neurology

## 2018-05-18 VITALS — BP 142/78 | HR 70 | Ht 63.0 in | Wt 181.0 lb

## 2018-05-18 DIAGNOSIS — E1142 Type 2 diabetes mellitus with diabetic polyneuropathy: Secondary | ICD-10-CM | POA: Diagnosis not present

## 2018-05-18 DIAGNOSIS — M069 Rheumatoid arthritis, unspecified: Secondary | ICD-10-CM

## 2018-05-18 LAB — FUNGUS CULTURE WITH STAIN

## 2018-05-18 LAB — FUNGAL ORGANISM REFLEX

## 2018-05-18 LAB — FUNGUS CULTURE RESULT

## 2018-05-18 MED ORDER — DULOXETINE HCL 30 MG PO CPEP: ORAL_CAPSULE | ORAL | 3 refills | Status: DC

## 2018-05-18 NOTE — Progress Notes (Signed)
Progress Notes by Suzzanne Cloud, NP at 05/18/2018 2:00 PM   Author: Suzzanne Cloud, NP Author Type: Nurse Practitioner Filed: 05/18/2018 3:09 PM  Note Status: Sign when Signing Visit Cosign: Cosign Not Required Encounter Date: 05/18/2018  Editor: Suzzanne Cloud, NP (Nurse Practitioner)     Show:Clear all [x] Manual[x] Template[] Copied  Added by: [x] Suzzanne Cloud, NP  [] Hover for details    Reason for visit: Peripheral neuropathy  KRYSTEENA STALKER is an 86 y.o. female  History of present illness:  Ms. Tinkle is an 85 year old right handed female with history of diabetes and neuropathy pain for 30 years. The pain involves bilateral hands and feet. She is currently taking gabapentin. At the last visit we checked blood work and found an elevated rheumatoid factor (468), cyclic citrullin peptide (>250), sed rate (47). On 12/9 she had a right first metatarsal bone resection and debridement for a chronic foot ulcer related to peripheral artery disease.   Her neuropathy is no better. She continues to have pain, burning, numbness to all 4 extremities. The pain is worse to her legs, and most severely in her right leg. She has been in a wheelchair for the past month post-op. She is now using a cane to ambulate and wearing a boot to her right leg. Fortunately, she has not had any falls. She only took the Cymbalta for 2 weeks, and stopped after she felt it provided no benefit. She has not followed-up with rheumatology at this point. She is accompanied by her husband for today's appointment.        Past Medical History:  Diagnosis Date  . Diabetic nephropathy ([place])   . Foot ulcer, right, with fat layer exposed ([place])    non-healing  . History of bilateral breast cancer 04-14-2018 per pt no recurrence   dx 2000 w/ right breast cancer, s/p  lumpectomy with node dissection completed radiation/  dx 2002 w/ left breast cancer, s/p lumpectomy with node dissection,  completed radiation  .  History of diverticulitis of colon 2009   s/p  partial colectomy for perforated diverticulitis  . History of external beam radiation therapy    2000 for right breast cancer;   2002  for left breast cancer  (per pt 36 treatment for each breast)  . HLD (hyperlipidemia) 03/24/2017  . Neuropathy   . OA (osteoarthritis)   . Osteoporosis 04/14/2017  . Peripheral arterial occlusive disease ([place])    04-13-2018   s/p  balloon angioplasty to the anterior tibial artery for severe stenosis right lower extremity (dr Fletcher Anon)  . Type 2 diabetes mellitus ([place])          Past Surgical History:  Procedure Laterality Date  . ABDOMINAL AORTOGRAM W/LOWER EXTREMITY N/A 04/13/2018   Procedure: ABDOMINAL AORTOGRAM W/LOWER EXTREMITY;  Surgeon: [name], MD;  Location: [facility];  Service: Cardiovascular;  Laterality: N/A;  . APPENDECTOMY  age 59  . BREAST LUMPECTOMY WITH AXILLARY LYMPH NODE DISSECTION Bilateral right 2000;   left 2002  . BUNIONECTOMY Bilateral 1968  . CARPAL TUNNEL RELEASE Right 2017  . CATARACT EXTRACTION W/PHACO Right 08/30/2017   Procedure: CATARACT EXTRACTION PHACO AND INTRAOCULAR LENS PLACEMENT RIGHT EYE;  Surgeon: [name], MD;  Location: [facility];  Service: Ophthalmology;  Laterality: Right;  CDE: 13.63  . CATARACT EXTRACTION W/PHACO Left 09/20/2017   Procedure: CATARACT EXTRACTION PHACO AND INTRAOCULAR LENS PLACEMENT LEFT EYE;  Surgeon: [name], MD;  Location: [facility];  Service: Ophthalmology;  Laterality: Left;  CDE: 11.25  . D & C HYSTEROSCOPY W/ RESECTION POLYP  2017   per pt benign  . FRACTURE SURGERY Left 05/12/2016   patella fracture,   per pt no retatined hardware  . GRAFT APPLICATION N/A [id number]/6960   Procedure: APPLICATION OF STRAVIX GRAFT;  Surgeon: [name], DPM;  Location: [facility];  Service: Podiatry;  Laterality: N/A;  . HEMICOLECTOMY  2009   diverticulitis w/ perforation  . METATARSAL HEAD EXCISION N/A  04/18/2018   Procedure: FIRST METATARSAL HEAD RECESSION, WOUND DEBRIEDMENT;  Surgeon: [name], DPM;  Location: [facility];  Service: Podiatry;  Laterality: N/A;  . NASAL SEPTOPLASTY W/ TURBINOPLASTY Bilateral 01/04/2018   Procedure: NASAL SEPTOPLASTY WITH BILATERAL TURBINATE REDUCTION;  Surgeon: [name], MD;  Location: [facility];  Service: ENT;  Laterality: Bilateral;  . PERIPHERAL VASCULAR BALLOON ANGIOPLASTY  04/13/2018   Procedure: PERIPHERAL VASCULAR BALLOON ANGIOPLASTY;  Surgeon: [name], MD;  Location: [facility];  Service: Cardiovascular;;  Anterior Tibial  . VENTRAL HERNIA REPAIR  01-07-2016    [name],    w/ mesh         Family History  Problem Relation Age of Onset  . Diabetes Mother   . Heart disease Mother   . Arthritis Mother   . Hypertension Mother   . Miscarriages / [place] Mother   . Early death Father   . Stroke Father   . Alcohol abuse Father     Social history:  reports that she quit smoking about 55 years ago. Her smoking use included cigarettes. She started smoking about 68 years ago. She has a 12.00 pack-year smoking history. She has never used smokeless tobacco. She reports that she does not drink alcohol or use drugs.         Allergies  Allergen Reactions  . Phenergan [Promethazine Hcl] Other (See Comments)    DELIRIUM (HALLUCINATIONS)  . Ciprofloxacin Swelling  . Levofloxacin Swelling    Medications:         Prior to Admission medications   Medication Sig Start Date End Date Taking? Authorizing Provider  atorvastatin (LIPITOR) 10 MG tablet Take 1 tablet (10 mg total) by mouth daily. 04/13/18 04/13/19  [name], MD  clopidogrel (PLAVIX) 75 MG tablet Take 1 tablet (75 mg total) by mouth daily. 04/13/18 04/13/19  [name], MD  fluocinonide gel (LIDEX) 9.52 % Apply 1 application topically daily as needed (mouth sores).  03/14/18   [provider]  gabapentin (NEURONTIN) 400 MG capsule Take 2 capsules (800 mg total) by mouth 4 (four) times daily. BREAKFAST, LUNCH, DINNER, & BEDTIME. 01/26/18   Caren Macadam, MD  lidocaine (LMX) 4 % cream Apply 1 application topically as needed (pain).    [provider]  metFORMIN (GLUCOPHAGE) 500 MG tablet Take 1 tablet (500 mg total) by mouth daily after supper. 01/26/18   Caren Macadam, MD  Methylsulfonylmethane (MSM PO) Take 1,000 mg by mouth 3 (three) times daily.    [provider]  Multiple Vitamin (MULTIVITAMIN WITH MINERALS) TABS tablet Take 1 tablet by mouth daily.    [provider]  Naphazoline HCl (CLEAR EYES OP) Place 1 drop into both eyes daily.    [provider]  OVER THE COUNTER MEDICATION Take 2 tablets by mouth daily. [employer] otc supplement    [provider]  OVER THE COUNTER MEDICATION Take 1 tablet by mouth daily. Colon Support otc supplement    [provider]  Pramoxine-Dimethicone (GOLD BOND INTENSIVE HEALING EX) Apply 1 application topically daily.    [provider]  Skin Protectants, Misc. (AMERIGEL BARRIER EX) Apply 1 application topically daily. Apply to wound on foot    [provider]  Turmeric, Curcuma Longa, (CURCUMIN) POWD Take 1 capsule by mouth daily.     [provider]    ROS:  Out of a complete 14 system review of symptoms, the patient complains only of the following symptoms, and all other reviewed systems are negative.  Hearing loss, excessive thirst, frequent waking, daytime sleepiness, joint pain, aching muscles, muscle cramps, numbness, weakness.    Physical Exam  General: The patient is alert and cooperative at the time of the examination.  Skin: No significant peripheral edema is noted. She has a healing post-op wound to her posterior right great toe.   Neurologic Exam  Mental status: The patient is alert and oriented x 3  at the time of the examination. The patient has apparent normal recent and remote memory, with an apparently normal attention span and concentration ability.  Cranial nerves: Facial symmetry is present. Speech is normal, no aphasia or dysarthria is noted. Extraocular movements are full. Visual fields are full.  Motor: The patient has good strength in all 4 extremities.  Sensory examination: Sensory testing intact to pinpoint, soft touch, vibration sensation, and position sense to upper extremities. Lower extremities show a stocking pattern pinprick sensory deficit to knees bilaterally, significant impairment of vibration and position sense seen in both feet (R > L).   Coordination: The patient has good finger-nose-finger and heel-to-shin bilaterally.  Gait and station: Gait is slightly wide-based. She is using a cane, and wearing a boot to right leg.   Reflexes: Deep tendon reflexes are symmetric, but are depressed.   Assessment/Plan:  1. Peripheral Neuropathy  The pain and discomfort of neuropathy remains significant to her hands and feet. The patient was again prescribed Cymbalta, working up the dose. They have been instructed to communicate with our office and let us know if any dose adjustments are needed. The patient expressed interest in possibly cutting back on gabapentin if Cymbalta proves beneficial. We discussed the possibility that this pain could be of multiple sources including, peripheral vascular disease, rheumatoid arthritis, and neuropathy. She has been taken off metformin and has good control of her diabetes at this time. We will refer her to rheumatology and I verified her phone number for accuracy. She will follow-up in 6 months or sooner if needed.   Kathrynn Ducking

## 2018-05-18 NOTE — Patient Instructions (Signed)
Start the Cymbalta 30 mg twice a day for 2 weeks, then start 60 mg twice a day. Call me in 3 weeks if no better or if you are having side effects.

## 2018-05-23 ENCOUNTER — Telehealth: Payer: Self-pay | Admitting: Neurology

## 2018-05-23 NOTE — Telephone Encounter (Signed)
[facility] sent referral back and said that they could not accept referral because of there high referral volume .  I have sent referral to Dr. Estanislado Pandy to see   If she could see patient.

## 2018-05-24 ENCOUNTER — Ambulatory Visit: Payer: Medicare Other | Admitting: Cardiovascular Disease

## 2018-05-27 DIAGNOSIS — E1151 Type 2 diabetes mellitus with diabetic peripheral angiopathy without gangrene: Secondary | ICD-10-CM | POA: Diagnosis not present

## 2018-05-27 DIAGNOSIS — B351 Tinea unguium: Secondary | ICD-10-CM | POA: Diagnosis not present

## 2018-05-27 DIAGNOSIS — L84 Corns and callosities: Secondary | ICD-10-CM | POA: Diagnosis not present

## 2018-05-27 DIAGNOSIS — M79672 Pain in left foot: Secondary | ICD-10-CM | POA: Diagnosis not present

## 2018-05-27 DIAGNOSIS — M79671 Pain in right foot: Secondary | ICD-10-CM | POA: Diagnosis not present

## 2018-06-02 ENCOUNTER — Ambulatory Visit (INDEPENDENT_AMBULATORY_CARE_PROVIDER_SITE_OTHER): Payer: Medicare Other | Admitting: Medical

## 2018-06-02 ENCOUNTER — Encounter: Payer: Self-pay | Admitting: Medical

## 2018-06-02 VITALS — BP 152/76 | HR 61 | Ht 63.0 in | Wt 180.0 lb

## 2018-06-02 DIAGNOSIS — R030 Elevated blood-pressure reading, without diagnosis of hypertension: Secondary | ICD-10-CM

## 2018-06-02 DIAGNOSIS — E118 Type 2 diabetes mellitus with unspecified complications: Secondary | ICD-10-CM | POA: Diagnosis not present

## 2018-06-02 DIAGNOSIS — E785 Hyperlipidemia, unspecified: Secondary | ICD-10-CM | POA: Diagnosis not present

## 2018-06-02 DIAGNOSIS — I739 Peripheral vascular disease, unspecified: Secondary | ICD-10-CM

## 2018-06-02 NOTE — Patient Instructions (Signed)
Medication Instructions:  Your physician recommends that you continue on your current medications as directed. Please refer to the Current Medication list given to you today. If you need a refill on your cardiac medications before your next appointment, please call your pharmacy.   Lab work: NONE If you have labs (blood work) drawn today and your tests are completely normal, you will receive your results only by: [name] MyChart Message (if you have MyChart) OR . A paper copy in the mail If you have any lab test that is abnormal or we need to change your treatment, we will call you to review the results.  Testing/Procedures: Your physician has requested that you have an ankle brachial index (ABI). During this test an ultrasound and blood pressure cuff are used to evaluate the arteries that supply the arms and legs with blood. Allow thirty minutes for this exam. There are no restrictions or special instructions.  Your physician has requested that you have a lower extremity arterial exercise duplex. During this test, exercise and ultrasound are used to evaluate arterial blood flow in the legs. Allow one hour for this exam. There are no restrictions or special instructions. SCHEDULE TEST IN 6 MONTHS   Follow-Up: At [facility], you and your health needs are our priority.  As part of our continuing mission to provide you with exceptional heart care, we have created designated Provider Care Teams.  These Care Teams include your primary Cardiologist (physician) and Advanced Practice Providers (APPs -  Physician Assistants and Nurse Practitioners) who all work together to provide you with the care you need, when you need it. You will need a follow up appointment in 6 months.  Please call our office 2 months (MAY 2020) in advance to schedule this appointment.  You may see Kathlyn Sacramento, MD or one of the following Advanced Practice Providers on your designated Care Team:   Kerin Ransom, PA-C Roby Lofts,  [place] . Sande Rives, PA-C  Any Other Special Instructions Will Be Listed Below (If Applicable).    DASH Eating Plan DASH stands for "Dietary Approaches to Stop Hypertension." The DASH eating plan is a healthy eating plan that has been shown to reduce high blood pressure (hypertension). It may also reduce your risk for type 2 diabetes, heart disease, and stroke. The DASH eating plan may also help with weight loss. What are tips for following this plan?  General guidelines  Avoid eating more than 2,300 mg (milligrams) of salt (sodium) a day. If you have hypertension, you may need to reduce your sodium intake to 1,500 mg a day.  Limit alcohol intake to no more than 1 drink a day for nonpregnant women and 2 drinks a day for men. One drink equals 12 oz of beer, 5 oz of wine, or 1 oz of hard liquor.  Work with your health care provider to maintain a healthy body weight or to lose weight. Ask what an ideal weight is for you.  Get at least 30 minutes of exercise that causes your heart to beat faster (aerobic exercise) most days of the week. Activities may include walking, swimming, or biking.  Work with your health care provider or diet and nutrition specialist (dietitian) to adjust your eating plan to your individual calorie needs. Reading food labels   Check food labels for the amount of sodium per serving. Choose foods with less than 5 percent of the Daily Value of sodium. Generally, foods with less than 300 mg of sodium per serving fit  into this eating plan.  To find whole grains, look for the word "whole" as the first word in the ingredient list. Shopping  Buy products labeled as "low-sodium" or "no salt added."  Buy fresh foods. Avoid canned foods and premade or frozen meals. Cooking  Avoid adding salt when cooking. Use salt-free seasonings or herbs instead of table salt or sea salt. Check with your health care provider or pharmacist before using salt substitutes.  Do not fry  foods. Cook foods using healthy methods such as baking, boiling, grilling, and broiling instead.  Cook with heart-healthy oils, such as olive, canola, soybean, or sunflower oil. Meal planning  Eat a balanced diet that includes: ? 5 or more servings of fruits and vegetables each day. At each meal, try to fill half of your plate with fruits and vegetables. ? Up to 6-8 servings of whole grains each day. ? Less than 6 oz of lean meat, poultry, or fish each day. A 3-oz serving of meat is about the same size as a deck of cards. One egg equals 1 oz. ? 2 servings of low-fat dairy each day. ? A serving of nuts, seeds, or beans 5 times each week. ? Heart-healthy fats. Healthy fats called Omega-3 fatty acids are found in foods such as flaxseeds and coldwater fish, like sardines, salmon, and mackerel.  Limit how much you eat of the following: ? Canned or prepackaged foods. ? Food that is high in trans fat, such as fried foods. ? Food that is high in saturated fat, such as fatty meat. ? Sweets, desserts, sugary drinks, and other foods with added sugar. ? Full-fat dairy products.  Do not salt foods before eating.  Try to eat at least 2 vegetarian meals each week.  Eat more home-cooked food and less restaurant, buffet, and fast food.  When eating at a restaurant, ask that your food be prepared with less salt or no salt, if possible. What foods are recommended? The items listed may not be a complete list. Talk with your dietitian about what dietary choices are best for you. Grains Whole-grain or whole-wheat bread. Whole-grain or whole-wheat pasta. Brown rice. [place]. Bulgur. Whole-grain and low-sodium cereals. Pita bread. Low-fat, low-sodium crackers. Whole-wheat flour tortillas. Vegetables Fresh or frozen vegetables (raw, steamed, roasted, or grilled). Low-sodium or reduced-sodium tomato and vegetable juice. Low-sodium or reduced-sodium tomato sauce and tomato paste. Low-sodium or  reduced-sodium canned vegetables. Fruits All fresh, dried, or frozen fruit. Canned fruit in natural juice (without added sugar). Meat and other protein foods Skinless chicken or [place]. Ground chicken or [place]. Pork with fat trimmed off. Fish and seafood. Egg whites. Dried beans, peas, or lentils. Unsalted nuts, nut butters, and seeds. Unsalted canned beans. Lean cuts of beef with fat trimmed off. Low-sodium, lean deli meat. Dairy Low-fat (1%) or fat-free (skim) milk. Fat-free, low-fat, or reduced-fat cheeses. Nonfat, low-sodium ricotta or cottage cheese. Low-fat or nonfat yogurt. Low-fat, low-sodium cheese. Fats and oils Soft margarine without trans fats. Vegetable oil. Low-fat, reduced-fat, or light mayonnaise and salad dressings (reduced-sodium). Canola, safflower, olive, soybean, and sunflower oils. Avocado. Seasoning and other foods Herbs. Spices. Seasoning mixes without salt. Unsalted popcorn and pretzels. Fat-free sweets. What foods are not recommended? The items listed may not be a complete list. Talk with your dietitian about what dietary choices are best for you. Grains Baked goods made with fat, such as croissants, muffins, or some breads. Dry pasta or rice meal packs. Vegetables Creamed or fried vegetables. Vegetables in a cheese  sauce. Regular canned vegetables (not low-sodium or reduced-sodium). Regular canned tomato sauce and paste (not low-sodium or reduced-sodium). Regular tomato and vegetable juice (not low-sodium or reduced-sodium). Angie Fava. Olives. Fruits Canned fruit in a light or heavy syrup. Fried fruit. Fruit in cream or butter sauce. Meat and other protein foods Fatty cuts of meat. Ribs. Fried meat. Berniece Salines. Sausage. Bologna and other processed lunch meats. Salami. Fatback. Hotdogs. Bratwurst. Salted nuts and seeds. Canned beans with added salt. Canned or smoked fish. Whole eggs or egg yolks. Chicken or [place] with skin. Dairy Whole or 2% milk, cream, and half-and-half.  Whole or full-fat cream cheese. Whole-fat or sweetened yogurt. Full-fat cheese. Nondairy creamers. Whipped toppings. Processed cheese and cheese spreads. Fats and oils Butter. Stick margarine. Lard. Shortening. Ghee. Bacon fat. Tropical oils, such as coconut, palm kernel, or palm oil. Seasoning and other foods Salted popcorn and pretzels. Onion salt, garlic salt, seasoned salt, table salt, and sea salt. Worcestershire sauce. Tartar sauce. Barbecue sauce. Teriyaki sauce. Soy sauce, including reduced-sodium. Steak sauce. Canned and packaged gravies. Fish sauce. Oyster sauce. Cocktail sauce. Horseradish that you find on the shelf. Ketchup. Mustard. Meat flavorings and tenderizers. Bouillon cubes. Hot sauce and Tabasco sauce. Premade or packaged marinades. Premade or packaged taco seasonings. Relishes. Regular salad dressings. Where to find more information:  National Heart, Lung, and [place]: https://wilson-eaton.com/  American Heart Association: www.heart.org Summary  The DASH eating plan is a healthy eating plan that has been shown to reduce high blood pressure (hypertension). It may also reduce your risk for type 2 diabetes, heart disease, and stroke.  With the DASH eating plan, you should limit salt (sodium) intake to 2,300 mg a day. If you have hypertension, you may need to reduce your sodium intake to 1,500 mg a day.  When on the DASH eating plan, aim to eat more fresh fruits and vegetables, whole grains, lean proteins, low-fat dairy, and heart-healthy fats.  Work with your health care provider or diet and nutrition specialist (dietitian) to adjust your eating plan to your individual calorie needs. This information is not intended to replace advice given to you by your health care provider. Make sure you discuss any questions you have with your health care provider. Document Released: 04/16/2011 Document Revised: 04/20/2016 Document Reviewed: 04/20/2016 Elsevier Interactive Patient Education   2019 [employer].

## 2018-06-02 NOTE — Progress Notes (Signed)
Cardiology Office Note   Date:  06/02/2018   ID:  Jessica Russell, DOB 04/15/1928, MRN [id number]  PCP:  Doree Albee, MD  Cardiologist:  Kathlyn Sacramento, MD EP: None  Chief Complaint  Patient presents with  . Follow-up    PAD      History of Present Illness: Jessica Russell is a 85 y.o. female with PMH of PAD s/p recent right first metatarsal head bone resection, HLD, DM type 2 with peripheral neuropathy, and RA, who presents for follow-up of her PAD.  She was last seen outpatient by Dr. Fletcher Anon 04/05/18, in consultation for a non-healing ulcer under her right big toe despite aggressive wound care. She was planned for surgery, however vascular studies were requested prior to amputation. It was felt that her circulation was not optimal for proper wound healing and she was recommended to undergo LE angiography with possible endovascular intervention. This occurred 04/13/18 and she was found to have severe stenosis in the mid anterior tibial artery with occluded dorsalis pedis distally managed with balloon angioplasty. She underwent successful removal of the right first metatarsal head bone. She was started on plavix and did experience some hematuria briefly which has since resolved.  She returns today with her husband for follow-up of her PAD.She reports doing well since her surgery. She is pleased with how well her foot is healing. She has been cleared for ambulation after spending a month wheelchair bound. No complaints of claudication. She reports that her blood pressures have been intermittently elevated recently. She and her husband have been eating pre-packaged foods/ frozen meals more frequently likely contributing to increased salt intake. She has never been on blood pressure medications in the past. She reports being off of her plavix since having hematuria. No complaints of chest pain, SOB, DOE, LE edema, dizziness, lightheadedness, or syncope.       Past Medical  History:  Diagnosis Date  . Diabetic nephropathy ([place])   . Foot ulcer, right, with fat layer exposed ([place])    non-healing  . History of bilateral breast cancer 04-14-2018 per pt no recurrence   dx 2000 w/ right breast cancer, s/p  lumpectomy with node dissection completed radiation/  dx 2002 w/ left breast cancer, s/p lumpectomy with node dissection,  completed radiation  . History of diverticulitis of colon 2009   s/p  partial colectomy for perforated diverticulitis  . History of external beam radiation therapy    2000 for right breast cancer;   2002  for left breast cancer  (per pt 36 treatment for each breast)  . HLD (hyperlipidemia) 03/24/2017  . Neuropathy   . OA (osteoarthritis)   . Osteoporosis 04/14/2017  . Peripheral arterial occlusive disease ([place])    04-13-2018   s/p  balloon angioplasty to the anterior tibial artery for severe stenosis right lower extremity (dr Fletcher Anon)  . Type 2 diabetes mellitus ([place])     Past Surgical History:  Procedure Laterality Date  . ABDOMINAL AORTOGRAM W/LOWER EXTREMITY N/A 04/13/2018   Procedure: ABDOMINAL AORTOGRAM W/LOWER EXTREMITY;  Surgeon: [name], MD;  Location: [facility];  Service: Cardiovascular;  Laterality: N/A;  . APPENDECTOMY  age 77  . BREAST LUMPECTOMY WITH AXILLARY LYMPH NODE DISSECTION Bilateral right 2000;   left 2002  . BUNIONECTOMY Bilateral 1968  . CARPAL TUNNEL RELEASE Right 2017  . CATARACT EXTRACTION W/PHACO Right 08/30/2017   Procedure: CATARACT EXTRACTION PHACO AND INTRAOCULAR LENS PLACEMENT RIGHT EYE;  Surgeon: [name], MD;  Location: [facility];  Service: Ophthalmology;  Laterality: Right;  CDE: 13.63  . CATARACT EXTRACTION W/PHACO Left 09/20/2017   Procedure: CATARACT EXTRACTION PHACO AND INTRAOCULAR LENS PLACEMENT LEFT EYE;  Surgeon: [name], MD;  Location: [facility];  Service: Ophthalmology;  Laterality: Left;  CDE: 11.25  . D & C HYSTEROSCOPY W/ RESECTION POLYP  2017   per pt benign  . FOOT SURGERY      removal of two bones  . FRACTURE SURGERY Left 05/12/2016   patella fracture,   per pt no retatined hardware  . GRAFT APPLICATION N/A 16/07/8451   Procedure: APPLICATION OF STRAVIX GRAFT;  Surgeon: [name], DPM;  Location: [facility];  Service: Podiatry;  Laterality: N/A;  . HEMICOLECTOMY  2009   diverticulitis w/ perforation  . METATARSAL HEAD EXCISION N/A 04/18/2018   Procedure: FIRST METATARSAL HEAD RECESSION, WOUND DEBRIEDMENT;  Surgeon: [name], DPM;  Location: [facility];  Service: Podiatry;  Laterality: N/A;  . NASAL SEPTOPLASTY W/ TURBINOPLASTY Bilateral 01/04/2018   Procedure: NASAL SEPTOPLASTY WITH BILATERAL TURBINATE REDUCTION;  Surgeon: [name], MD;  Location: [facility];  Service: ENT;  Laterality: Bilateral;  . PERIPHERAL VASCULAR BALLOON ANGIOPLASTY  04/13/2018   Procedure: PERIPHERAL VASCULAR BALLOON ANGIOPLASTY;  Surgeon: [name], MD;  Location: [facility];  Service: Cardiovascular;;  Anterior Tibial  . SKIN GRAFT     left foot   . VENTRAL HERNIA REPAIR  01-07-2016    [name], [place]   w/ mesh     Current Outpatient Medications  Medication Sig Dispense Refill  . atorvastatin (LIPITOR) 10 MG tablet Take 1 tablet (10 mg total) by mouth daily. 30 tablet 6  . clopidogrel (PLAVIX) 75 MG tablet Take 1 tablet (75 mg total) by mouth daily. 30 tablet 3  . DULoxetine (CYMBALTA) 30 MG capsule One twice a day for 2 weeks, then take 2 twice a day 120 capsule 3  . fluocinonide gel (LIDEX) 6.46 % Apply 1 application topically as needed (mouth sores).   0  . gabapentin (NEURONTIN) 400 MG capsule Take 2 capsules (800 mg total) by mouth 4 (four) times daily. BREAKFAST, LUNCH, DINNER, & BEDTIME. (Patient taking differently: Take 800 mg by mouth 4 (four) times daily. 2 BREAKFAST, 1 LUNCH, 2 DINNER, & 2 BEDTIME.) 720 capsule 0  . lidocaine (LMX) 4 % cream Apply 1 application topically as needed (pain).    .  Methylsulfonylmethane (MSM PO) Take 1,000 mg by mouth 3 (three) times daily.    . Naphazoline HCl (CLEAR EYES OP) Place 1 drop into both eyes daily.    [name] OVER THE COUNTER MEDICATION Take 2 tablets by mouth daily. [employer] otc supplement    . OVER THE COUNTER MEDICATION Take 1 tablet by mouth daily. Colon Support otc supplement    . Pramoxine-Dimethicone (GOLD BOND INTENSIVE HEALING EX) Apply 1 application topically daily.    . Skin Protectants, Misc. (AMERIGEL BARRIER EX) Apply 1 application topically daily. Apply to wound on foot    . Vitamin D, Ergocalciferol, (DRISDOL) 1.25 MG (50000 UT) CAPS capsule Take 50,000 Units by mouth daily.     No current facility-administered medications for this visit.     Allergies:   Phenergan [promethazine hcl]; Ciprofloxacin; and Levofloxacin    Social History:  The patient  reports that she quit smoking about 55 years ago. Her smoking use included cigarettes. She started smoking about 68 years ago. She has a 12.00 pack-year smoking history.  She has never used smokeless tobacco. She reports that she does not drink alcohol or use drugs.   Family History:  The patient's family history includes Alcohol abuse in her father; Arthritis in her mother; Diabetes in her mother; Early death in her father; Heart disease in her mother; Hypertension in her mother; Miscarriages / Stillbirths in her mother; Stroke in her father.    ROS:  Please see the history of present illness.   Otherwise, review of systems are positive for none.   All other systems are reviewed and negative.    PHYSICAL EXAM: VS:  BP (!) 152/76   Pulse 61   Ht 5\' 3"  (1.6 m)   Wt 180 lb (81.6 kg)   BMI 31.89 kg/m  , BMI Body mass index is 31.89 kg/m. GEN: Well nourished, well developed, in no acute distress HEENT: sclera anicteric Neck: no JVD, carotid bruits, or masses Cardiac: RRR; no murmurs, rubs, or gallops,no edema  Respiratory:  clear to auscultation bilaterally, normal work of  breathing GI: soft, nontender, nondistended, + BS MS: no deformity or atrophy; Right foot wound healing well Skin: warm and dry, no rash Neuro:  Strength and sensation are intact Psych: euthymic mood, full affect   EKG:  EKG is not ordered today.   Recent Labs: 04/05/2018: BUN 15; Creatinine, Ser 0.65; Hemoglobin 11.8; Platelets 214; Potassium 4.7; Sodium 142    Lipid Panel No results found for: CHOL, TRIG, HDL, CHOLHDL, VLDL, LDLCALC, LDLDIRECT    Wt Readings from Last 3 Encounters:  06/02/18 180 lb (81.6 kg)  05/18/18 181 lb (82.1 kg)  04/18/18 180 lb 7 oz (81.8 kg)      Other studies Reviewed: Additional studies/ records that were reviewed today include:   Abdominal aortogram with LE 04/13/2018: 1.  No significant aortoiliac disease. 2.  Right lower extremity: No significant SFA disease.  Severe stenosis in the mid anterior tibial artery with occluded dorsalis pedis distally.  Occluded TP trunk with reconstitution of the peroneal artery proximally via extensive collaterals from the anterior tibial artery and reconstitution of the distal posterior tibial artery via collaterals from the peroneal artery. 3.  Successful balloon angioplasty to the anterior tibial artery with excellent results.  Recommendations: The patient should have adequate flow now to heal anticipated foot surgery.  If additional blood flow is needed, she will require retrograde access via the peroneal artery to revascularize the TP trunk. Recommend dual antiplatelet therapy for few months but if needed, Plavix can be held 5 days before surgery.    ASSESSMENT AND PLAN:  1. PAD s/p balloon angioplasty of her anterior tibial artery and removal of right 1st metatarsal head bone: wound site is healing well. No complaints of claudication. She is tolerating atorvastatin well. She was recommended for DAPT for a few months, however she reports stopping her plavix last month after experiencing some hematuria and  has not restarted. Does not appear she was started on aspirin - Continue atorvastatin - Will discuss with Dr. Fletcher Anon whether patient should restart plavix +/- ASA.   2. Elevated blood pressure: she reports intermittently elevated blood pressure readings over the past month - 152/76 today but she reports having SBP in the 110s as well. Likely increased sodium intake is playing a role.  - Recommend low sodium diet - If continues to be persistently elevated, could consider initiating very low dose ARB for BP control.   3. HLD: low dose atorvastatin initiated after initial PAD assessment. She is tolerating this well -  Continue atorvastatin  4. DM type 2: A1C 6.1 08/2017 - Continue management per PCP   Current medicines are reviewed at length with the patient today.  The patient does not have concerns regarding medicines.  The following changes have been made:  no change  Labs/ tests ordered today include:  No orders of the defined types were placed in this encounter.    Disposition:   FU with Dr. Fletcher Anon in 6 months after completion of ABI studies.   Signed, Abigail Butts, PA-C  06/02/2018 10:08 AM

## 2018-06-03 ENCOUNTER — Telehealth: Payer: Self-pay

## 2018-06-03 MED ORDER — ASPIRIN EC 81 MG PO TBEC: 81.0000 mg | DELAYED_RELEASE_TABLET | Freq: Every day | ORAL | 3 refills | Status: DC

## 2018-06-03 NOTE — Telephone Encounter (Signed)
Patient notified directly and voiced understanding . Advised patient that her bracelet would be placed upfront when ever she was available to come pick it up.

## 2018-06-03 NOTE — Telephone Encounter (Signed)
-----   Message from Abigail Butts, PA-C sent at 06/03/2018 12:39 PM EST ----- Regarding: Please call patient Nigel Sloop,   Can you please call Mrs. Veith and instruct her to start taking a baby aspirin (81mg ) once daily at this time. This is to limit further narrowing of the vessels in her legs. Thank you!  ----- Message ----- From: [name], MD Sent: 06/02/2018  11:56 PM EST To: Abigail Butts, PA-C  We should just leave her on Aspirin 81 mg daily . No need for DAPT . Thanks.   ----- Message ----- From: Abigail Butts., PA-C Sent: 06/02/2018  10:09 AM EST To: [name], MD  Hey Dr. Fletcher Anon,  I saw Mrs. Pfund in clinic today for follow-up of her PAD. She has not been taking plavix since experiencing hematuria shortly after starting this medication. Also does not appear she was started on aspirin. Just wanted to touch base with you regarding DAPT recommendations - should she restart plavix AND aspirin, just plavix, or just aspirin?  Thank you!

## 2018-06-08 DIAGNOSIS — M205X2 Other deformities of toe(s) (acquired), left foot: Secondary | ICD-10-CM | POA: Diagnosis not present

## 2018-06-08 DIAGNOSIS — L97512 Non-pressure chronic ulcer of other part of right foot with fat layer exposed: Secondary | ICD-10-CM | POA: Diagnosis not present

## 2018-06-15 DIAGNOSIS — M9904 Segmental and somatic dysfunction of sacral region: Secondary | ICD-10-CM | POA: Diagnosis not present

## 2018-06-15 DIAGNOSIS — M545 Low back pain: Secondary | ICD-10-CM | POA: Diagnosis not present

## 2018-06-27 DIAGNOSIS — M9904 Segmental and somatic dysfunction of sacral region: Secondary | ICD-10-CM | POA: Diagnosis not present

## 2018-06-27 DIAGNOSIS — M545 Low back pain: Secondary | ICD-10-CM | POA: Diagnosis not present

## 2018-06-30 ENCOUNTER — Ambulatory Visit: Payer: Medicare Other | Admitting: Obstetrics and Gynecology

## 2018-07-04 ENCOUNTER — Ambulatory Visit (INDEPENDENT_AMBULATORY_CARE_PROVIDER_SITE_OTHER): Payer: Medicare Other | Admitting: Obstetrics and Gynecology

## 2018-07-04 ENCOUNTER — Encounter (INDEPENDENT_AMBULATORY_CARE_PROVIDER_SITE_OTHER): Payer: Self-pay

## 2018-07-04 ENCOUNTER — Encounter: Payer: Self-pay | Admitting: Obstetrics and Gynecology

## 2018-07-04 VITALS — BP 138/78 | HR 74 | Ht 63.0 in | Wt 179.2 lb

## 2018-07-04 DIAGNOSIS — Z4689 Encounter for fitting and adjustment of other specified devices: Secondary | ICD-10-CM | POA: Diagnosis not present

## 2018-07-04 DIAGNOSIS — N812 Incomplete uterovaginal prolapse: Secondary | ICD-10-CM | POA: Diagnosis not present

## 2018-07-04 NOTE — Progress Notes (Signed)
Patient ID: Jessica Russell, female   DOB: 1928/05/05, 85 y.o.   MRN: [id number] GYNECOLOGY CLINIC PROGRESS NOTE, recheck of pessary at 6 months after initial insertion S:  Couldn't hold her bladder this past Saturday 07/02/2018 but Sunday 07/03/2018 was perfecrtly fine. Says she didn't have the urge to pee but kept leaking. Most of the time doesn't notice pessary in place.     Pelvic: The patient presented today for a pessary check. She reports no vaginal bleeding or discharge. She denies pelvic discomfort and difficulty urinating or moving her bowels. ?The patient's number 4 [place] 2.50 pessary was removed, cleaned and replaced without complications. Cervix appears normal with minimal discharge and no vaginal irritation. Speculum examination revealed normal vaginal mucosa with no lesions or lacerations.  P: The patient should return in 1 year for a pessary check and continue to use vaginal estrogen cream weekly as prescribed.  Currently the patient is not using it  By signing my name below, I, Samul Dada, attest that this documentation has been prepared under the direction and in the presence of Jonnie Kind, MD. Electronically Signed: [place]. 07/04/18. 1:47 PM.  I personally performed the services described in this documentation, which was SCRIBED in my presence. The recorded information has been reviewed and considered accurate. It has been edited as necessary during review. Jonnie Kind, MD

## 2018-07-06 DIAGNOSIS — M205X2 Other deformities of toe(s) (acquired), left foot: Secondary | ICD-10-CM | POA: Diagnosis not present

## 2018-07-06 DIAGNOSIS — E1151 Type 2 diabetes mellitus with diabetic peripheral angiopathy without gangrene: Secondary | ICD-10-CM | POA: Diagnosis not present

## 2018-07-07 ENCOUNTER — Other Ambulatory Visit: Payer: Self-pay | Admitting: Cardiovascular Disease

## 2018-07-07 MED ORDER — CLOPIDOGREL BISULFATE 75 MG PO TABS: 75.0000 mg | ORAL_TABLET | Freq: Every day | ORAL | 3 refills | Status: DC

## 2018-07-13 DIAGNOSIS — M9904 Segmental and somatic dysfunction of sacral region: Secondary | ICD-10-CM | POA: Diagnosis not present

## 2018-07-13 DIAGNOSIS — M545 Low back pain: Secondary | ICD-10-CM | POA: Diagnosis not present

## 2018-07-18 DIAGNOSIS — Z1283 Encounter for screening for malignant neoplasm of skin: Secondary | ICD-10-CM | POA: Diagnosis not present

## 2018-07-18 DIAGNOSIS — D225 Melanocytic nevi of trunk: Secondary | ICD-10-CM | POA: Diagnosis not present

## 2018-07-18 DIAGNOSIS — L218 Other seborrheic dermatitis: Secondary | ICD-10-CM | POA: Diagnosis not present

## 2018-07-18 DIAGNOSIS — D485 Neoplasm of uncertain behavior of skin: Secondary | ICD-10-CM | POA: Diagnosis not present

## 2018-07-20 ENCOUNTER — Encounter (INDEPENDENT_AMBULATORY_CARE_PROVIDER_SITE_OTHER): Payer: Self-pay | Admitting: Internal Medicine

## 2018-07-21 ENCOUNTER — Ambulatory Visit (INDEPENDENT_AMBULATORY_CARE_PROVIDER_SITE_OTHER): Payer: Medicare Other | Admitting: Otolaryngology

## 2018-07-21 DIAGNOSIS — H9041 Sensorineural hearing loss, unilateral, right ear, with unrestricted hearing on the contralateral side: Secondary | ICD-10-CM

## 2018-07-21 DIAGNOSIS — H6123 Impacted cerumen, bilateral: Secondary | ICD-10-CM

## 2018-07-25 ENCOUNTER — Other Ambulatory Visit: Payer: Self-pay | Admitting: Otolaryngology

## 2018-07-25 ENCOUNTER — Other Ambulatory Visit (HOSPITAL_COMMUNITY): Payer: Self-pay | Admitting: Otolaryngology

## 2018-07-25 DIAGNOSIS — IMO0001 Reserved for inherently not codable concepts without codable children: Secondary | ICD-10-CM

## 2018-07-25 DIAGNOSIS — Z6831 Body mass index (BMI) 31.0-31.9, adult: Secondary | ICD-10-CM | POA: Diagnosis not present

## 2018-07-25 DIAGNOSIS — E669 Obesity, unspecified: Secondary | ICD-10-CM | POA: Diagnosis not present

## 2018-07-25 DIAGNOSIS — H9042 Sensorineural hearing loss, unilateral, left ear, with unrestricted hearing on the contralateral side: Secondary | ICD-10-CM

## 2018-07-25 DIAGNOSIS — M2550 Pain in unspecified joint: Secondary | ICD-10-CM | POA: Diagnosis not present

## 2018-07-25 DIAGNOSIS — R768 Other specified abnormal immunological findings in serum: Secondary | ICD-10-CM | POA: Diagnosis not present

## 2018-07-27 ENCOUNTER — Other Ambulatory Visit: Payer: Self-pay

## 2018-07-27 ENCOUNTER — Ambulatory Visit (INDEPENDENT_AMBULATORY_CARE_PROVIDER_SITE_OTHER): Payer: Medicare Other | Admitting: Orthopaedic Surgery

## 2018-07-27 ENCOUNTER — Ambulatory Visit (INDEPENDENT_AMBULATORY_CARE_PROVIDER_SITE_OTHER): Payer: Medicare Other

## 2018-07-27 ENCOUNTER — Encounter: Payer: Self-pay | Admitting: Orthopaedic Surgery

## 2018-07-27 VITALS — BP 136/78 | HR 68 | Ht 61.75 in | Wt 178.8 lb

## 2018-07-27 DIAGNOSIS — M25511 Pain in right shoulder: Secondary | ICD-10-CM

## 2018-07-27 DIAGNOSIS — M25531 Pain in right wrist: Secondary | ICD-10-CM

## 2018-07-27 NOTE — Progress Notes (Signed)
Subjective:    Patient ID: Jessica Russell, female    DOB: [birth year], 86 y.o.   MRN: [id number]  HPI She has had swelling of the volar right wrist for several months. She has seen her neurologist who is treating her for neuropathy and was referred to rheumatologist who said she did not have a rheumatoid problem and was asked to come here.  She has no trauma but she did do a lot of house cleaning before she developed her pain and swelling.  She has swelling of the wrist just below the wrist crease and more to radial side.  She has no redness or numbness.  She has tried Tylenol, a rub and heat with slight help.  She also has shoulder pain on the right that is getting worse and worse.  She says it bothers her more than the wrist today.  She has pain with overhead use and in sleeping on it.   She has no numbness, no swelling, no redness.  She says nothing seems to help.  She has no pain in the neck or left shoulder area.     Review of Systems  Constitutional: Positive for activity change.  Musculoskeletal: Positive for arthralgias, joint swelling and myalgias.  All other systems reviewed and are negative.  For Review of Systems, all other systems reviewed and are negative.  The following is a summary of the past history medically, past history surgically, known current medicines, social history and family history.  This information is gathered electronically by the computer from prior information and documentation.  I review this each visit and have found including this information at this point in the chart is beneficial and informative.   Past Medical History:  Diagnosis Date  . Diabetic nephropathy ([place])   . Foot ulcer, right, with fat layer exposed ([place])    non-healing  . History of bilateral breast cancer 04-14-2018 per pt no recurrence   dx 2000 w/ right breast cancer, s/p  lumpectomy with node dissection completed radiation/  dx 2002 w/ left breast cancer, s/p lumpectomy with node  dissection,  completed radiation  . History of diverticulitis of colon 2009   s/p  partial colectomy for perforated diverticulitis  . History of external beam radiation therapy    2000 for right breast cancer;   2002  for left breast cancer  (per pt 36 treatment for each breast)  . HLD (hyperlipidemia) 03/24/2017  . Neuropathy   . OA (osteoarthritis)   . Osteoporosis 04/14/2017  . Peripheral arterial occlusive disease ([place])    04-13-2018   s/p  balloon angioplasty to the anterior tibial artery for severe stenosis right lower extremity (dr Fletcher Anon)  . Type 2 diabetes mellitus ([place])     Past Surgical History:  Procedure Laterality Date  . ABDOMINAL AORTOGRAM W/LOWER EXTREMITY N/A 04/13/2018   Procedure: ABDOMINAL AORTOGRAM W/LOWER EXTREMITY;  Surgeon: [name], MD;  Location: [facility];  Service: Cardiovascular;  Laterality: N/A;  . APPENDECTOMY  age 59  . BREAST LUMPECTOMY WITH AXILLARY LYMPH NODE DISSECTION Bilateral right 2000;   left 2002  . BUNIONECTOMY Bilateral 1968  . CARPAL TUNNEL RELEASE Right 2017  . CATARACT EXTRACTION W/PHACO Right 08/30/2017   Procedure: CATARACT EXTRACTION PHACO AND INTRAOCULAR LENS PLACEMENT RIGHT EYE;  Surgeon: [name], MD;  Location: [facility];  Service: Ophthalmology;  Laterality: Right;  CDE: 13.63  . CATARACT EXTRACTION W/PHACO Left 09/20/2017   Procedure: CATARACT EXTRACTION PHACO AND INTRAOCULAR LENS PLACEMENT LEFT  EYE;  Surgeon: [name], MD;  Location: [facility];  Service: Ophthalmology;  Laterality: Left;  CDE: 11.25  . D & C HYSTEROSCOPY W/ RESECTION POLYP  2017   per pt benign  . FOOT SURGERY     removal of two bones  . FRACTURE SURGERY Left 05/12/2016   patella fracture,   per pt no retatined hardware  . GRAFT APPLICATION N/A 23/11/6281   Procedure: APPLICATION OF STRAVIX GRAFT;  Surgeon: [name], DPM;  Location: [facility];  Service: Podiatry;  Laterality: N/A;  . HEMICOLECTOMY  2009   diverticulitis  w/ perforation  . METATARSAL HEAD EXCISION N/A 04/18/2018   Procedure: FIRST METATARSAL HEAD RECESSION, WOUND DEBRIEDMENT;  Surgeon: [name], DPM;  Location: [facility];  Service: Podiatry;  Laterality: N/A;  . NASAL SEPTOPLASTY W/ TURBINOPLASTY Bilateral 01/04/2018   Procedure: NASAL SEPTOPLASTY WITH BILATERAL TURBINATE REDUCTION;  Surgeon: [name], MD;  Location: [facility];  Service: ENT;  Laterality: Bilateral;  . PERIPHERAL VASCULAR BALLOON ANGIOPLASTY  04/13/2018   Procedure: PERIPHERAL VASCULAR BALLOON ANGIOPLASTY;  Surgeon: [name], MD;  Location: [facility];  Service: Cardiovascular;;  Anterior Tibial  . SKIN GRAFT     left foot   . VENTRAL HERNIA REPAIR  01-07-2016    [name], [place]   w/ mesh    Current Outpatient Medications on File Prior to Visit  Medication Sig Dispense Refill  . aspirin EC 81 MG tablet Take 1 tablet (81 mg total) by mouth daily. 90 tablet 3  . atorvastatin (LIPITOR) 10 MG tablet Take 1 tablet (10 mg total) by mouth daily. (Patient not taking: Reported on 07/04/2018) 30 tablet 6  . clopidogrel (PLAVIX) 75 MG tablet Take 1 tablet (75 mg total) by mouth daily. 90 tablet 3  . DULoxetine (CYMBALTA) 30 MG capsule One twice a day for 2 weeks, then take 2 twice a day (Patient not taking: Reported on 07/04/2018) 120 capsule 3  . fluocinonide gel (LIDEX) 1.51 % Apply 1 application topically as needed (mouth sores).   0  . gabapentin (NEURONTIN) 300 MG capsule TAKE 1 CAPSULE 3 TIMES A DAY BY MOUTH    . gabapentin (NEURONTIN) 400 MG capsule Take 2 capsules (800 mg total) by mouth 4 (four) times daily. BREAKFAST, LUNCH, DINNER, & BEDTIME. (Patient taking differently: Take 800 mg by mouth 4 (four) times daily. 2 BREAKFAST, 1 LUNCH, 2 DINNER, & 2 BEDTIME.) 720 capsule 0  . lidocaine (LMX) 4 % cream Apply 1 application topically as needed (pain).    . Methylsulfonylmethane (MSM PO) Take 1,000 mg by mouth 3 (three) times daily.     . Naphazoline HCl (CLEAR EYES OP) Place 1 drop into both eyes daily.    [name] OVER THE COUNTER MEDICATION Take 2 tablets by mouth daily. [employer]health otc supplement    . OVER THE COUNTER MEDICATION Take 1 tablet by mouth daily. Colon Support otc supplement    . Pramoxine-Dimethicone (GOLD BOND INTENSIVE HEALING EX) Apply 1 application topically daily.    . Skin Protectants, Misc. (AMERIGEL BARRIER EX) Apply 1 application topically daily. Apply to wound on foot    . Vitamin D, Ergocalciferol, (DRISDOL) 1.25 MG (50000 UT) CAPS capsule Take 50,000 Units by mouth daily.     No current facility-administered medications on file prior to visit.     Social History   Socioeconomic History  . Marital status: Married    Spouse name: Not on file  .  Number of children: 1  . Years of education: 53  . Highest education level: Not on file  Occupational History  . Occupation: retired  Scientific laboratory technician  . Financial resource strain: Not on file  . Food insecurity:    Worry: Not on file    Inability: Not on file  . Transportation needs:    Medical: Not on file    Non-medical: Not on file  Tobacco Use  . Smoking status: Former Smoker    Packs/day: 1.00    Years: 12.00    Pack years: 12.00    Types: Cigarettes    Start date: 05/11/1950    Last attempt to quit: 05/12/1963    Years since quitting: 55.2  . Smokeless tobacco: Never Used  Substance and Sexual Activity  . Alcohol use: No    Frequency: Never  . Drug use: No  . Sexual activity: Not Currently    Birth control/protection: Post-menopausal  Lifestyle  . Physical activity:    Days per week: Not on file    Minutes per session: Not on file  . Stress: Not on file  Relationships  . Social connections:    Talks on phone: Not on file    Gets together: Not on file    Attends religious service: Not on file    Active member of club or organization: Not on file    Attends meetings of clubs or organizations: Not on file    Relationship status: Not on  file  . Intimate partner violence:    Fear of current or ex partner: Not on file    Emotionally abused: Not on file    Physically abused: Not on file    Forced sexual activity: Not on file  Other Topics Concern  . Not on file  Social History Narrative   Lives with husband [name]   One son [name] lives in [place]    Father was a [place] from [place], patient is bilingual   Married for over 48 years.    Caffeine use: 1 cup per day   Right handed    Family History  Problem Relation Age of Onset  . Diabetes Mother   . Heart disease Mother   . Arthritis Mother   . Hypertension Mother   . Miscarriages / [place] Mother   . Early death Father   . Stroke Father   . Alcohol abuse Father     BP 136/78   Pulse 68   Ht 5' 1.75" (1.568 m)   Wt 178 lb 12.8 oz (81.1 kg)   BMI 32.97 kg/m   Body mass index is 32.97 kg/m.     Objective:   Physical Exam Vitals signs reviewed.  Constitutional:      Appearance: She is well-developed.  HENT:     Head: Normocephalic and atraumatic.  Eyes:     Conjunctiva/sclera: Conjunctivae normal.     Pupils: Pupils are equal, round, and reactive to light.  Neck:     Musculoskeletal: Normal range of motion and neck supple.  Cardiovascular:     Rate and Rhythm: Normal rate and regular rhythm.  Pulmonary:     Effort: Pulmonary effort is normal.  Abdominal:     Palpations: Abdomen is soft.  Musculoskeletal:     Right shoulder: She exhibits decreased range of motion and tenderness.       Arms:       Hands:  Skin:    General: Skin is warm and dry.  Neurological:  Mental Status: She is alert and oriented to person, place, and time.     Cranial Nerves: No cranial nerve deficit.     Motor: No abnormal muscle tone.     Coordination: Coordination normal.     Deep Tendon Reflexes: Reflexes are normal and symmetric. Reflexes normal.  Psychiatric:        Behavior: Behavior normal.        Thought Content: Thought content normal.         Judgment: Judgment normal.      X-rays were done of the right shoulder and right wrist, reported separately.  Negative.     Assessment & Plan:   Encounter Diagnoses  Name Primary?  . Right wrist pain Yes  . Pain in joint of right shoulder    PROCEDURE NOTE:  The patient request injection, verbal consent was obtained.  The right shoulder was prepped appropriately after time out was performed.   Sterile technique was observed and injection of 1 cc of Depo-Medrol 40 mg with several cc's of plain xylocaine. Anesthesia was provided by ethyl chloride and a 20-gauge needle was used to inject the shoulder area. A posterior approach was used.  The injection was tolerated well.  A band aid dressing was applied.  The patient was advised to apply ice later today and tomorrow to the injection sight as needed.  I have given a cock-up splint for the right wrist.  She is to use the rub she has at home but tid instead of daily to the wrist area.  Return in two weeks.  Call if any problem.  Precautions discussed.   Electronically Signed Sanjuana Kava, MD 3/18/20202:59 PM

## 2018-08-01 ENCOUNTER — Encounter (HOSPITAL_COMMUNITY): Payer: Self-pay

## 2018-08-01 ENCOUNTER — Ambulatory Visit (HOSPITAL_COMMUNITY): Payer: Medicare Other

## 2018-08-09 ENCOUNTER — Ambulatory Visit: Payer: Medicare Other | Admitting: Orthopaedic Surgery

## 2018-08-11 DIAGNOSIS — M545 Low back pain: Secondary | ICD-10-CM | POA: Diagnosis not present

## 2018-08-11 DIAGNOSIS — M9904 Segmental and somatic dysfunction of sacral region: Secondary | ICD-10-CM | POA: Diagnosis not present

## 2018-08-22 ENCOUNTER — Ambulatory Visit (INDEPENDENT_AMBULATORY_CARE_PROVIDER_SITE_OTHER): Payer: Medicare Other | Admitting: Nurse Practitioner

## 2018-08-22 DIAGNOSIS — E559 Vitamin D deficiency, unspecified: Secondary | ICD-10-CM | POA: Diagnosis not present

## 2018-08-22 DIAGNOSIS — G608 Other hereditary and idiopathic neuropathies: Secondary | ICD-10-CM | POA: Diagnosis not present

## 2018-08-22 DIAGNOSIS — E119 Type 2 diabetes mellitus without complications: Secondary | ICD-10-CM | POA: Diagnosis not present

## 2018-08-22 DIAGNOSIS — R32 Unspecified urinary incontinence: Secondary | ICD-10-CM | POA: Diagnosis not present

## 2018-08-22 DIAGNOSIS — R5383 Other fatigue: Secondary | ICD-10-CM | POA: Diagnosis not present

## 2018-08-29 DIAGNOSIS — L218 Other seborrheic dermatitis: Secondary | ICD-10-CM | POA: Diagnosis not present

## 2018-09-06 ENCOUNTER — Ambulatory Visit: Payer: Medicare Other | Admitting: Orthopaedic Surgery

## 2018-09-07 ENCOUNTER — Ambulatory Visit (INDEPENDENT_AMBULATORY_CARE_PROVIDER_SITE_OTHER): Payer: Medicare Other | Admitting: Orthopaedic Surgery

## 2018-09-07 ENCOUNTER — Encounter: Payer: Self-pay | Admitting: Orthopaedic Surgery

## 2018-09-07 ENCOUNTER — Other Ambulatory Visit: Payer: Self-pay

## 2018-09-07 DIAGNOSIS — M25531 Pain in right wrist: Secondary | ICD-10-CM | POA: Diagnosis not present

## 2018-09-07 DIAGNOSIS — M25511 Pain in right shoulder: Secondary | ICD-10-CM

## 2018-09-07 NOTE — Progress Notes (Signed)
Virtual Visit via Telephone Note  I connected with Jessica Russell on 09/07/18 at  9:20 AM EDT by telephone and verified that I am speaking with the correct person using two identifiers.   I discussed the limitations, risks, security and privacy concerns of performing an evaluation and management service by telephone and the availability of in person appointments. I also discussed with the patient that there may be a patient responsible charge related to this service. The patient expressed understanding and agreed to proceed.   History of Present Illness: She has less pain of the right wrist. She has pain still in the right shoulder and upper arm but it is lessened after using a rub on it.  She has no new trauma, no paresthesias.  She does not feel she needs to come to office.  She will call if she gets worse.    Observations/Objective: Per above  Assessment and Plan: Encounter Diagnoses  Name Primary?  . Pain in joint of right shoulder Yes  . Right wrist pain      Follow Up Instructions: As needed   I discussed the assessment and treatment plan with the patient. The patient was provided an opportunity to ask questions and all were answered. The patient agreed with the plan and demonstrated an understanding of the instructions.   The patient was advised to call back or seek an in-person evaluation if the symptoms worsen or if the condition fails to improve as anticipated.  I provided 8 minutes of non-face-to-face time during this encounter.   Sanjuana Kava, MD

## 2018-09-08 DIAGNOSIS — M545 Low back pain: Secondary | ICD-10-CM | POA: Diagnosis not present

## 2018-09-08 DIAGNOSIS — M9904 Segmental and somatic dysfunction of sacral region: Secondary | ICD-10-CM | POA: Diagnosis not present

## 2018-09-13 ENCOUNTER — Ambulatory Visit: Payer: Medicare Other | Admitting: Orthopaedic Surgery

## 2018-09-22 ENCOUNTER — Encounter: Payer: Self-pay | Admitting: Orthopaedic Surgery

## 2018-09-22 ENCOUNTER — Other Ambulatory Visit: Payer: Self-pay

## 2018-09-22 ENCOUNTER — Ambulatory Visit (INDEPENDENT_AMBULATORY_CARE_PROVIDER_SITE_OTHER): Payer: Medicare Other | Admitting: Orthopaedic Surgery

## 2018-09-22 VITALS — Temp 97.1°F

## 2018-09-22 DIAGNOSIS — M25531 Pain in right wrist: Secondary | ICD-10-CM | POA: Diagnosis not present

## 2018-09-22 DIAGNOSIS — M25511 Pain in right shoulder: Secondary | ICD-10-CM

## 2018-09-22 NOTE — Progress Notes (Signed)
Patient Jessica Russell I Jessica Russell, female DOB:10/05/1927, 85 y.o. VQQ:[id number]  Chief Complaint  Patient presents with  . Shoulder Pain    right/ wants injection     HPI  Jessica Russell is a 85 y.o. female who has chronic pain of the right shoulder.  She has pain with overhead use and sleeping at night.  She has no swelling and no redness.  The right wrist is tender on the volar side over the area of the flexor radialis tendon.  She has no redness.  She says it hurts to dorsiflex her wrist or to lift heavy objects.  She has no numbness.  Nothing seems to help it. She wears a brace.   There is no height or weight on file to calculate BMI.  ROS  Review of Systems  Constitutional: Positive for activity change.  Musculoskeletal: Positive for arthralgias, joint swelling and myalgias.  All other systems reviewed and are negative.   All other systems reviewed and are negative.  The following is a summary of the past history medically, past history surgically, known current medicines, social history and family history.  This information is gathered electronically by the computer from prior information and documentation.  I review this each visit and have found including this information at this point in the chart is beneficial and informative.    Past Medical History:  Diagnosis Date  . Diabetic nephropathy (Jessica Russell)   . Foot ulcer, right, with fat layer exposed (Jessica Russell)    non-healing  . History of bilateral breast cancer 04-14-2018 per pt no recurrence   dx 2000 w/ right breast cancer, s/p  lumpectomy with node dissection completed radiation/  dx 2002 w/ left breast cancer, s/p lumpectomy with node dissection,  completed radiation  . History of diverticulitis of colon 2009   s/p  partial colectomy for perforated diverticulitis  . History of external beam radiation therapy    2000 for right breast cancer;   2002  for left breast cancer  (per pt 36 treatment for each breast)  . HLD  (hyperlipidemia) 03/24/2017  . Neuropathy   . OA (osteoarthritis)   . Osteoporosis 04/14/2017  . Peripheral arterial occlusive disease (Jessica Russell)    04-13-2018   s/p  balloon angioplasty to the anterior tibial artery for severe stenosis right lower extremity (dr Jessica Russell)  . Type 2 diabetes mellitus (Jessica Russell)     Past Surgical History:  Procedure Laterality Date  . ABDOMINAL AORTOGRAM W/LOWER EXTREMITY N/A 04/13/2018   Procedure: ABDOMINAL AORTOGRAM W/LOWER EXTREMITY;  Surgeon: Jessica Hampshire, MD;  Location: [facility];  Service: Cardiovascular;  Laterality: N/A;  . APPENDECTOMY  age 25  . BREAST LUMPECTOMY WITH AXILLARY LYMPH NODE DISSECTION Bilateral right 2000;   left 2002  . BUNIONECTOMY Bilateral 1968  . CARPAL TUNNEL RELEASE Right 2017  . CATARACT EXTRACTION W/PHACO Right 08/30/2017   Procedure: CATARACT EXTRACTION PHACO AND INTRAOCULAR LENS PLACEMENT RIGHT EYE;  Surgeon: Jessica Branch, MD;  Location: [facility];  Service: Ophthalmology;  Laterality: Right;  CDE: 13.63  . CATARACT EXTRACTION W/PHACO Left 09/20/2017   Procedure: CATARACT EXTRACTION PHACO AND INTRAOCULAR LENS PLACEMENT LEFT EYE;  Surgeon: Jessica Branch, MD;  Location: [facility];  Service: Ophthalmology;  Laterality: Left;  CDE: 11.25  . D & C HYSTEROSCOPY W/ RESECTION POLYP  2017   per pt benign  . FOOT SURGERY     removal of two bones  . FRACTURE SURGERY Left 05/12/2016   patella fracture,   per pt no  retatined hardware  . GRAFT APPLICATION N/A 28/07/1515   Procedure: APPLICATION OF STRAVIX GRAFT;  Surgeon: Jessica Russell, DPM;  Location: [facility];  Service: Podiatry;  Laterality: N/A;  . HEMICOLECTOMY  2009   diverticulitis w/ perforation  . METATARSAL HEAD EXCISION N/A 04/18/2018   Procedure: FIRST METATARSAL HEAD RECESSION, WOUND DEBRIEDMENT;  Surgeon: Jessica Russell, DPM;  Location: [facility];  Service: Podiatry;  Laterality: N/A;  . NASAL SEPTOPLASTY W/ TURBINOPLASTY Bilateral 01/04/2018    Procedure: NASAL SEPTOPLASTY WITH BILATERAL TURBINATE REDUCTION;  Surgeon: Jessica Baptist, MD;  Location: [facility];  Service: ENT;  Laterality: Bilateral;  . PERIPHERAL VASCULAR BALLOON ANGIOPLASTY  04/13/2018   Procedure: PERIPHERAL VASCULAR BALLOON ANGIOPLASTY;  Surgeon: Jessica Hampshire, MD;  [facility];  Service: Cardiovascular;;  Anterior Tibial  . SKIN GRAFT     left foot   . VENTRAL HERNIA REPAIR  01-07-2016    Jessica Russell, Jessica Russell   w/ mesh    Family History  Problem Relation Age of Onset  . Diabetes Mother   . Heart disease Mother   . Arthritis Mother   . Hypertension Mother   . Miscarriages / [place] Mother   . Early death Father   . Stroke Father   . Alcohol abuse Father     Social History Social History   Tobacco Use  . Smoking status: Former Smoker    Packs/day: 1.00    Years: 12.00    Pack years: 12.00    Types: Cigarettes    Start date: 05/11/1950    Last attempt to quit: 05/12/1963    Years since quitting: 55.4  . Smokeless tobacco: Never Used  Substance Use Topics  . Alcohol use: No    Frequency: Never  . Drug use: No    Allergies  Allergen Reactions  . Phenergan [Promethazine Hcl] Other (See Comments)    DELIRIUM (HALLUCINATIONS)  . Ciprofloxacin Swelling  . Levofloxacin Swelling    Current Outpatient Medications  Medication Sig Dispense Refill  . aspirin EC 81 MG tablet Take 1 tablet (81 mg total) by mouth daily. 90 tablet 3  . atorvastatin (LIPITOR) 10 MG tablet Take 1 tablet (10 mg total) by mouth daily. 30 tablet 6  . clopidogrel (PLAVIX) 75 MG tablet Take 1 tablet (75 mg total) by mouth daily. 90 tablet 3  . fluocinonide gel (LIDEX) 6.16 % Apply 1 application topically as needed (mouth sores).   0  . gabapentin (NEURONTIN) 300 MG capsule TAKE 1 CAPSULE 3 TIMES A DAY BY MOUTH    . gabapentin (NEURONTIN) 400 MG capsule Take 2 capsules (800 mg total) by mouth 4 (four) times daily. BREAKFAST, LUNCH, DINNER, & BEDTIME.  (Patient taking differently: Take 800 mg by mouth 4 (four) times daily. 2 BREAKFAST, 1 LUNCH, 2 DINNER, & 2 BEDTIME.) 720 capsule 0  . lidocaine (LMX) 4 % cream Apply 1 application topically as needed (pain).    . Methylsulfonylmethane (MSM PO) Take 1,000 mg by mouth 3 (three) times daily.    . Naphazoline HCl (CLEAR EYES OP) Place 1 drop into both eyes daily.    [name] OVER THE COUNTER MEDICATION Take 2 tablets by mouth daily. [employer] otc supplement    . OVER THE COUNTER MEDICATION Take 1 tablet by mouth daily. Colon Support otc supplement    . Pramoxine-Dimethicone (GOLD BOND INTENSIVE HEALING EX) Apply 1 application topically daily.    . Skin Protectants, Misc. (AMERIGEL BARRIER EX) Apply 1  application topically daily. Apply to wound on foot    . Vitamin D, Ergocalciferol, (DRISDOL) 1.25 MG (50000 UT) CAPS capsule Take 50,000 Units by mouth daily.    . DULoxetine (CYMBALTA) 30 MG capsule One twice a day for 2 weeks, then take 2 twice a day (Patient not taking: Reported on 07/04/2018) 120 capsule 3  . oxybutynin (DITROPAN) 5 MG tablet Take 5 mg by mouth daily.     No current facility-administered medications for this visit.      Physical Exam  Temperature (!) 97.1 F (36.2 C).  Constitutional: overall normal hygiene, normal nutrition, well developed, normal grooming, normal body habitus. Assistive device:cock-up splint right  Musculoskeletal: gait and station Limp none, muscle tone and strength are normal, no tremors or atrophy is present.  .  Neurological: coordination overall normal.  Deep tendon reflex/nerve stretch intact.  Sensation normal.  Cranial nerves II-XII intact.   Skin:   Normal overall no scars, lesions, ulcers or rashes. No psoriasis.  Psychiatric: Alert and oriented x 3.  Recent memory intact, remote memory unclear.  Normal mood and affect. Well groomed.  Good eye contact.  Cardiovascular: overall no swelling, no varicosities, no edema bilaterally, normal temperatures  of the legs and arms, no clubbing, cyanosis and good capillary refill.  Lymphatic: palpation is normal.  Examination of right Upper Extremity is done.  Inspection:   Overall:  Elbow non-tender without crepitus or defects, forearm non-tender without crepitus or defects, wrist non-tender without crepitus or defects, hand non-tender.    Shoulder: with glenohumeral joint tenderness, without effusion.   Upper arm:  without swelling and tenderness   Range of motion:   Overall:  Full range of motion of the elbow, full range of motion of wrist and full range of motion in fingers.   Shoulder:  right  165 degrees forward flexion; 150 degrees abduction; 35 degrees internal rotation, 35 degrees external rotation, 15 degrees extension, 40 degrees adduction.   Stability:   Overall:  Shoulder, elbow and wrist stable   Strength and Tone:   Overall full shoulder muscles strength, full upper arm strength and normal upper arm bulk and tone. All other systems reviewed and are negative   The patient has been educated about the nature of the problem(s) and counseled on treatment options.  The patient appeared to understand what I have discussed and is in agreement with it.  Encounter Diagnoses  Name Primary?  . Pain in joint of right shoulder Yes  . Right wrist pain     PLAN Call if any problems.  Precautions discussed.  Continue current medications.   Return to clinic 2 weeks   PROCEDURE NOTE:  The patient request injection, verbal consent was obtained.  The right shoulder was prepped appropriately after time out was performed.   Sterile technique was observed and injection of 1 cc of Depo-Medrol 40 mg with several cc's of plain xylocaine. Anesthesia was provided by ethyl chloride and a 20-gauge needle was used to inject the shoulder area. A posterior approach was used.  The injection was tolerated well.  A band aid dressing was applied.  The patient was advised to apply ice later today and  tomorrow to the injection sight as needed.  Procedure note: The wrist on the right volar side over the radial side was prepped after permission given by patient.  I injected 1 % Xylocaine and 1 cc DepoMedrol 40 into the sheath of the flexor radialis tolerated well by sterile technique.  Return in  two weeks. She may have telephone visit if she desires.  Call if any problem.  Precautions discussed.   Electronically Signed Sanjuana Kava, MD 5/14/20209:26 AM

## 2018-10-05 DIAGNOSIS — M545 Low back pain: Secondary | ICD-10-CM | POA: Diagnosis not present

## 2018-10-05 DIAGNOSIS — M9904 Segmental and somatic dysfunction of sacral region: Secondary | ICD-10-CM | POA: Diagnosis not present

## 2018-10-06 ENCOUNTER — Ambulatory Visit: Payer: Medicare Other | Admitting: Orthopaedic Surgery

## 2018-10-25 ENCOUNTER — Encounter: Payer: Self-pay | Admitting: Orthopaedic Surgery

## 2018-10-25 ENCOUNTER — Other Ambulatory Visit: Payer: Self-pay

## 2018-10-25 ENCOUNTER — Ambulatory Visit (INDEPENDENT_AMBULATORY_CARE_PROVIDER_SITE_OTHER): Payer: Medicare Other | Admitting: Orthopaedic Surgery

## 2018-10-25 VITALS — Temp 96.7°F

## 2018-10-25 DIAGNOSIS — M79672 Pain in left foot: Secondary | ICD-10-CM | POA: Diagnosis not present

## 2018-10-25 DIAGNOSIS — M79671 Pain in right foot: Secondary | ICD-10-CM | POA: Diagnosis not present

## 2018-10-25 DIAGNOSIS — M25511 Pain in right shoulder: Secondary | ICD-10-CM | POA: Diagnosis not present

## 2018-10-25 DIAGNOSIS — L84 Corns and callosities: Secondary | ICD-10-CM | POA: Diagnosis not present

## 2018-10-25 DIAGNOSIS — L602 Onychogryphosis: Secondary | ICD-10-CM | POA: Diagnosis not present

## 2018-10-25 DIAGNOSIS — E1151 Type 2 diabetes mellitus with diabetic peripheral angiopathy without gangrene: Secondary | ICD-10-CM | POA: Diagnosis not present

## 2018-10-25 NOTE — Progress Notes (Signed)
PROCEDURE NOTE:  The patient request injection, verbal consent was obtained.  The right shoulder was prepped appropriately after time out was performed.   Sterile technique was observed and injection of 1 cc of Depo-Medrol 40 mg with several cc's of plain xylocaine. Anesthesia was provided by ethyl chloride and a 20-gauge needle was used to inject the shoulder area. A posterior approach was used.  The injection was tolerated well.  A band aid dressing was applied.  The patient was advised to apply ice later today and tomorrow to the injection sight as needed.  I will see her as needed.  Electronically Signed Sanjuana Kava, MD 6/16/20209:54 AM

## 2018-11-03 DIAGNOSIS — M9901 Segmental and somatic dysfunction of cervical region: Secondary | ICD-10-CM | POA: Diagnosis not present

## 2018-11-03 DIAGNOSIS — M5412 Radiculopathy, cervical region: Secondary | ICD-10-CM | POA: Diagnosis not present

## 2018-11-07 DIAGNOSIS — M9901 Segmental and somatic dysfunction of cervical region: Secondary | ICD-10-CM | POA: Diagnosis not present

## 2018-11-07 DIAGNOSIS — M5412 Radiculopathy, cervical region: Secondary | ICD-10-CM | POA: Diagnosis not present

## 2018-11-08 DIAGNOSIS — H33031 Retinal detachment with giant retinal tear, right eye: Secondary | ICD-10-CM | POA: Diagnosis not present

## 2018-11-17 DIAGNOSIS — R208 Other disturbances of skin sensation: Secondary | ICD-10-CM | POA: Diagnosis not present

## 2018-11-17 DIAGNOSIS — D485 Neoplasm of uncertain behavior of skin: Secondary | ICD-10-CM | POA: Diagnosis not present

## 2018-11-21 DIAGNOSIS — M5412 Radiculopathy, cervical region: Secondary | ICD-10-CM | POA: Diagnosis not present

## 2018-11-21 DIAGNOSIS — M9901 Segmental and somatic dysfunction of cervical region: Secondary | ICD-10-CM | POA: Diagnosis not present

## 2018-11-23 DIAGNOSIS — M9901 Segmental and somatic dysfunction of cervical region: Secondary | ICD-10-CM | POA: Diagnosis not present

## 2018-11-23 DIAGNOSIS — M5412 Radiculopathy, cervical region: Secondary | ICD-10-CM | POA: Diagnosis not present

## 2018-11-28 DIAGNOSIS — M5412 Radiculopathy, cervical region: Secondary | ICD-10-CM | POA: Diagnosis not present

## 2018-11-28 DIAGNOSIS — M9901 Segmental and somatic dysfunction of cervical region: Secondary | ICD-10-CM | POA: Diagnosis not present

## 2018-12-01 ENCOUNTER — Encounter (HOSPITAL_COMMUNITY): Payer: Medicare Other

## 2018-12-01 ENCOUNTER — Inpatient Hospital Stay (HOSPITAL_COMMUNITY): Admission: RE | Admit: 2018-12-01 | Payer: Medicare Other | Source: Ambulatory Visit

## 2018-12-01 DIAGNOSIS — E559 Vitamin D deficiency, unspecified: Secondary | ICD-10-CM | POA: Diagnosis not present

## 2018-12-01 DIAGNOSIS — M059 Rheumatoid arthritis with rheumatoid factor, unspecified: Secondary | ICD-10-CM | POA: Diagnosis not present

## 2018-12-01 DIAGNOSIS — R5383 Other fatigue: Secondary | ICD-10-CM | POA: Diagnosis not present

## 2018-12-01 DIAGNOSIS — M130 Polyarthritis, unspecified: Secondary | ICD-10-CM | POA: Diagnosis not present

## 2018-12-01 DIAGNOSIS — E119 Type 2 diabetes mellitus without complications: Secondary | ICD-10-CM | POA: Diagnosis not present

## 2018-12-02 DIAGNOSIS — M9901 Segmental and somatic dysfunction of cervical region: Secondary | ICD-10-CM | POA: Diagnosis not present

## 2018-12-02 DIAGNOSIS — M5412 Radiculopathy, cervical region: Secondary | ICD-10-CM | POA: Diagnosis not present

## 2018-12-06 ENCOUNTER — Telehealth: Payer: Medicare Other | Admitting: Cardiovascular Disease

## 2018-12-09 ENCOUNTER — Telehealth: Payer: Self-pay | Admitting: Adult Health

## 2018-12-09 NOTE — Telephone Encounter (Signed)

## 2018-12-12 ENCOUNTER — Ambulatory Visit (INDEPENDENT_AMBULATORY_CARE_PROVIDER_SITE_OTHER): Payer: Medicare Other | Admitting: Adult Health

## 2018-12-12 ENCOUNTER — Other Ambulatory Visit: Payer: Self-pay

## 2018-12-12 ENCOUNTER — Encounter: Payer: Self-pay | Admitting: Adult Health

## 2018-12-12 VITALS — BP 120/71 | HR 68 | Ht 62.5 in | Wt 185.0 lb

## 2018-12-12 DIAGNOSIS — K625 Hemorrhage of anus and rectum: Secondary | ICD-10-CM | POA: Insufficient documentation

## 2018-12-12 DIAGNOSIS — N950 Postmenopausal bleeding: Secondary | ICD-10-CM | POA: Insufficient documentation

## 2018-12-12 DIAGNOSIS — K649 Unspecified hemorrhoids: Secondary | ICD-10-CM | POA: Diagnosis not present

## 2018-12-12 DIAGNOSIS — R195 Other fecal abnormalities: Secondary | ICD-10-CM

## 2018-12-12 DIAGNOSIS — N899 Noninflammatory disorder of vagina, unspecified: Secondary | ICD-10-CM

## 2018-12-12 DIAGNOSIS — T8389XA Other specified complication of genitourinary prosthetic devices, implants and grafts, initial encounter: Secondary | ICD-10-CM | POA: Insufficient documentation

## 2018-12-12 LAB — HEMOCCULT GUIAC POC 1CARD (OFFICE): Fecal Occult Blood, POC: POSITIVE — AB

## 2018-12-12 MED ORDER — METRONIDAZOLE 0.75 % VA GEL: 1.0000 | Freq: Every day | VAGINAL | 0 refills | Status: DC

## 2018-12-12 NOTE — Progress Notes (Signed)
Patient ID: Jessica Russell, female   DOB: [birth year], 85 y.o.   MRN: [id number] History of Present Illness: Jessica Russell is a 85 year old white female, married in complaining of vaginal bleeding on and off, saw blood yesterday, she had had polyps in the uterus before last on e in 2018.And she has had rectal bleeding. She had a #4 Gellhorn pessary and it was lasted removed and reinserted in February, by Dr Glo Herring. PCP is Dr Anastasio Champion    Current Medications, Allergies, Past Medical History, Past Surgical History, Family History and Social History were reviewed in [facility] record.     Review of Systems: +vaginal bleeding on and off, saw some yesterday +rectal bleeding, no pain    Physical Exam:BP 120/71 (BP Location: Left Arm, Patient Position: Sitting, Cuff Size: Normal)   Pulse 68   Ht 5' 2.5" (1.588 m)   Wt 185 lb (83.9 kg)   BMI 33.30 kg/m  General:  Well developed, well nourished, no acute distress Skin:  Warm and dry Pelvic:  External genitalia is normal in appearance, for age.  The vagina is atrophic, and has areas of irritation and blood, pessary was hard to remove and had Dr Johnnye Sima assistance.+odor. Urethra has no lesions or masses. The cervix is irritated  Uterus is felt to be normal size, shape, and contour.  No adnexal masses or tenderness noted.Bladder is non tender, no masses felt. Rectal: Good sphincter tone, no polyps, + hemorrhoids felt.  Hemoccult positive Psych:  No mood changes, alert and cooperative,seems happy Fall risk is low, she is using a cane Examination chaperoned by [employer] FNP student.  Face time 15 minutes, with 50 % explaining care plan.    Impression: 1. PMB (postmenopausal bleeding)   2. Vaginal irritation from pessary   3. Rectal bleeding   4. Hemorrhoids, unspecified hemorrhoid type   5. Positive fecal occult blood test       Plan: Will get GYN [place] to assess the uterus and then see Dr Glo Herring in next week or  so Will rx metrogel Meds ordered this encounter  Medications  . metroNIDAZOLE (METROGEL) 0.75 % vaginal gel    Sig: Place 1 Applicatorful vaginally at bedtime.    Dispense:  70 g    Refill:  0    Order Specific Question:   Supervising Provider    Answer:   Jessica Russell [[id number]]  Given 3 hemoccult cards to do and bring back Will leave pessary out for for, it was cleaned and placed in bag for her She is aware she can leave pessary out if does not want it reinserted at F/U

## 2018-12-15 ENCOUNTER — Other Ambulatory Visit: Payer: Self-pay

## 2018-12-15 DIAGNOSIS — Z20822 Contact with and (suspected) exposure to covid-19: Secondary | ICD-10-CM

## 2018-12-15 DIAGNOSIS — R6889 Other general symptoms and signs: Secondary | ICD-10-CM | POA: Diagnosis not present

## 2018-12-16 LAB — NOVEL CORONAVIRUS, NAA: SARS-CoV-2, NAA: NOT DETECTED

## 2018-12-27 ENCOUNTER — Other Ambulatory Visit: Payer: Self-pay | Admitting: Obstetrics and Gynecology

## 2018-12-27 DIAGNOSIS — N950 Postmenopausal bleeding: Secondary | ICD-10-CM

## 2018-12-28 ENCOUNTER — Ambulatory Visit (INDEPENDENT_AMBULATORY_CARE_PROVIDER_SITE_OTHER): Payer: Medicare Other | Admitting: Obstetrics and Gynecology

## 2018-12-28 ENCOUNTER — Other Ambulatory Visit: Payer: Self-pay

## 2018-12-28 ENCOUNTER — Ambulatory Visit (INDEPENDENT_AMBULATORY_CARE_PROVIDER_SITE_OTHER): Payer: Medicare Other

## 2018-12-28 ENCOUNTER — Encounter: Payer: Self-pay | Admitting: Obstetrics and Gynecology

## 2018-12-28 VITALS — BP 126/66 | HR 84 | Ht 62.5 in | Wt 184.8 lb

## 2018-12-28 DIAGNOSIS — K625 Hemorrhage of anus and rectum: Secondary | ICD-10-CM

## 2018-12-28 DIAGNOSIS — N950 Postmenopausal bleeding: Secondary | ICD-10-CM | POA: Diagnosis not present

## 2018-12-28 LAB — HEMOCCULT GUIAC POC 1CARD (OFFICE)
Card #2 Fecal Occult Blod, POC: NEGATIVE
Card #3 Fecal Occult Blood, POC: NEGATIVE
Fecal Occult Blood, POC: NEGATIVE

## 2018-12-28 NOTE — Progress Notes (Signed)
Patient ID: Jessica Russell, female   DOB: [birth year], 85 y.o.   MRN: [id number]   [facility] Visit  @DATE @            Patient name: Jessica Russell MRN [id number]  Date of birth: 02-10-1928  CC & HPI:  Jessica Russell is a 85 y.o. female presenting today for discussion of u/s. Positive hemoccult on 12/12/2018 in office with Anderson Malta NP, sent home with 3 hemoccult cards all negative. Is having some issues with hemorrhoids, they were tested for blood and came back negative. Denies any vaginal problems. Doesn't have her pessary in and is going well. She has a history of a pessary use and a question of endometrial polyp this 5 mm in diameter in an otherwise thin endometrial stripe Has had biopsy done in 2017 polyp was removed.  ROS:  ROS   Pertinent History Reviewed:   Reviewed: Significant for  Medical         Past Medical History:  Diagnosis Date  . Diabetic nephropathy ([place])   . Foot ulcer, right, with fat layer exposed ([place])    non-healing  . History of bilateral breast cancer 04-14-2018 per pt no recurrence   dx 2000 w/ right breast cancer, s/p  lumpectomy with node dissection completed radiation/  dx 2002 w/ left breast cancer, s/p lumpectomy with node dissection,  completed radiation  . History of diverticulitis of colon 2009   s/p  partial colectomy for perforated diverticulitis  . History of external beam radiation therapy    2000 for right breast cancer;   2002  for left breast cancer  (per pt 36 treatment for each breast)  . HLD (hyperlipidemia) 03/24/2017  . Neuropathy   . OA (osteoarthritis)   . Osteoporosis 04/14/2017  . Peripheral arterial occlusive disease ([place])    04-13-2018   s/p  balloon angioplasty to the anterior tibial artery for severe stenosis right lower extremity (dr Fletcher Anon)  . Type 2 diabetes mellitus ([place])                               Surgical Hx:    Past Surgical History:  Procedure Laterality Date  . ABDOMINAL AORTOGRAM W/LOWER  EXTREMITY N/A 04/13/2018   Procedure: ABDOMINAL AORTOGRAM W/LOWER EXTREMITY;  Surgeon: [name], MD;  Location: [facility];  Service: Cardiovascular;  Laterality: N/A;  . APPENDECTOMY  age 73  . BREAST LUMPECTOMY WITH AXILLARY LYMPH NODE DISSECTION Bilateral right 2000;   left 2002  . BUNIONECTOMY Bilateral 1968  . CARPAL TUNNEL RELEASE Right 2017  . CATARACT EXTRACTION W/PHACO Right 08/30/2017   Procedure: CATARACT EXTRACTION PHACO AND INTRAOCULAR LENS PLACEMENT RIGHT EYE;  Surgeon: [name], MD;  [facility];  Service: Ophthalmology;  Laterality: Right;  CDE: 13.63  . CATARACT EXTRACTION W/PHACO Left 09/20/2017   Procedure: CATARACT EXTRACTION PHACO AND INTRAOCULAR LENS PLACEMENT LEFT EYE;  Surgeon: [name], MD;  Location: [facility];  Service: Ophthalmology;  Laterality: Left;  CDE: 11.25  . D & C HYSTEROSCOPY W/ RESECTION POLYP  2017   per pt benign  . FOOT SURGERY     removal of two bones  . FRACTURE SURGERY Left 05/12/2016   patella fracture,   per pt no retatined hardware  . GRAFT APPLICATION N/A 15/05/7614   Procedure: APPLICATION OF STRAVIX GRAFT;  Surgeon: [name], DPM;  Location: [facility];  Service: Podiatry;  Laterality: N/A;  . HEMICOLECTOMY  2009   diverticulitis w/ perforation  . METATARSAL HEAD EXCISION N/A 04/18/2018   Procedure: FIRST METATARSAL HEAD RECESSION, WOUND DEBRIEDMENT;  Surgeon: [name], DPM;  Location: [facility];  Service: Podiatry;  Laterality: N/A;  . NASAL SEPTOPLASTY W/ TURBINOPLASTY Bilateral 01/04/2018   Procedure: NASAL SEPTOPLASTY WITH BILATERAL TURBINATE REDUCTION;  Surgeon: [name], MD;  Location: [facility];  Service: ENT;  Laterality: Bilateral;  . PERIPHERAL VASCULAR BALLOON ANGIOPLASTY  04/13/2018   Procedure: PERIPHERAL VASCULAR BALLOON ANGIOPLASTY;  Surgeon: [name], MD;  Location: [facility];  Service: Cardiovascular;;  Anterior Tibial  . SKIN  GRAFT     left foot   . VENTRAL HERNIA REPAIR  01-07-2016    [name], [place]   w/ mesh   Medications: Reviewed & Updated - see associated section                       Current Outpatient Medications:  .  aspirin EC 81 MG tablet, Take 1 tablet (81 mg total) by mouth daily., Disp: 90 tablet, Rfl: 3 .  fluocinonide gel (LIDEX) 6.54 %, Apply 1 application topically as needed (mouth sores). , Disp: , Rfl: 0 .  gabapentin (NEURONTIN) 400 MG capsule, Take 2 capsules (800 mg total) by mouth 4 (four) times daily. BREAKFAST, LUNCH, DINNER, & BEDTIME. (Patient taking differently: Take 800 mg by mouth 4 (four) times daily. 2 BREAKFAST, 2 LUNCH, 2 DINNER, & 2 BEDTIME.), Disp: 720 capsule, Rfl: 0 .  lidocaine (LMX) 4 % cream, Apply 1 application topically as needed (pain)., Disp: , Rfl:  .  metFORMIN (GLUCOPHAGE) 500 MG tablet, Take 500 mg by mouth daily., Disp: , Rfl:  .  Methylsulfonylmethane (MSM PO), Take 1,000 mg by mouth 3 (three) times daily., Disp: , Rfl:  .  Multiple Vitamins-Minerals (CENTRUM SILVER PO), Take by mouth daily., Disp: , Rfl:  .  Polyvinyl Alcohol-Povidone (REFRESH OP), Apply to eye 2 (two) times daily as needed., Disp: , Rfl:  .  Pramoxine-Dimethicone (GOLD BOND INTENSIVE HEALING EX), Apply 1 application topically daily., Disp: , Rfl:  .  Vitamin D, Ergocalciferol, (DRISDOL) 1.25 MG (50000 UT) CAPS capsule, Take 100,000 Units by mouth daily. , Disp: , Rfl:  .  metroNIDAZOLE (METROGEL) 0.75 % vaginal gel, Place 1 Applicatorful vaginally at bedtime., Disp: 70 g, Rfl: 0   Social History: Reviewed -  reports that she quit smoking about 55 years ago. Her smoking use included cigarettes. She started smoking about 68 years ago. She has a 12.00 pack-year smoking history. She has never used smokeless tobacco.  Objective Findings:  Vitals: Height 5' 2.5" (1.588 m).  PHYSICAL EXAMINATION General appearance - alert, well appearing, and in no distress Mental status - alert, oriented to person,  place, and time, normal mood, behavior, speech, dress, motor activity, and thought processes, affect appropriate to mood  PELVIC PELVIC US TA/TV: heterogeneous anteverted uterus with mult calcifications,EEC 4.2 mm, 7.3 x 3 x 6.3 mm echogenic mass within the endometrium,no color flow visualized (? endometrial polyp),normal ovaries bilat,ovaries appear mobile,no free fluid,no pain during ultrasound.  Pelvic exam deferred. Discussion only   Assessment & Plan:   A:  1.  endometrial polyp  P:  1.  Endo bx for 1 month.  Patient declines further evaluation today    By signing my name below, I, Samul Dada, attest that this documentation has been prepared under the direction and in  the presence of Jonnie Kind, MD. Electronically Signed: Samul Dada Medical Scribe. 12/28/18. 11:37 AM.  I personally performed the services described in this documentation, which was SCRIBED in my presence. The recorded information has been reviewed and considered accurate. It has been edited as necessary during review. Jonnie Kind, MD

## 2018-12-28 NOTE — Progress Notes (Signed)
PELVIC US TA/TV: heterogeneous anteverted uterus with mult calcifications,EEC 4.2 mm, 7.3 x 3 x 6.3 mm echogenic mass within the endometrium,no color flow visualized (? endometrial polyp),normal ovaries bilat,ovaries appear mobile,no free fluid,no pain during ultrasound

## 2018-12-29 DIAGNOSIS — M9901 Segmental and somatic dysfunction of cervical region: Secondary | ICD-10-CM | POA: Diagnosis not present

## 2018-12-29 DIAGNOSIS — M5412 Radiculopathy, cervical region: Secondary | ICD-10-CM | POA: Diagnosis not present

## 2019-01-02 ENCOUNTER — Encounter (HOSPITAL_COMMUNITY): Payer: Medicare Other

## 2019-01-03 DIAGNOSIS — L84 Corns and callosities: Secondary | ICD-10-CM | POA: Diagnosis not present

## 2019-01-03 DIAGNOSIS — E1151 Type 2 diabetes mellitus with diabetic peripheral angiopathy without gangrene: Secondary | ICD-10-CM | POA: Diagnosis not present

## 2019-01-03 DIAGNOSIS — L602 Onychogryphosis: Secondary | ICD-10-CM | POA: Diagnosis not present

## 2019-01-10 ENCOUNTER — Ambulatory Visit (HOSPITAL_COMMUNITY)
Admission: RE | Admit: 2019-01-10 | Discharge: 2019-01-10 | Disposition: A | Discharge status: Home / Self Care | Payer: Medicare Other | Source: Ambulatory Visit | Attending: Otolaryngology | Admitting: Otolaryngology

## 2019-01-10 ENCOUNTER — Other Ambulatory Visit: Payer: Self-pay

## 2019-01-10 DIAGNOSIS — H9042 Sensorineural hearing loss, unilateral, left ear, with unrestricted hearing on the contralateral side: Secondary | ICD-10-CM | POA: Insufficient documentation

## 2019-01-10 DIAGNOSIS — IMO0001 Reserved for inherently not codable concepts without codable children: Secondary | ICD-10-CM

## 2019-01-10 LAB — POCT I-STAT CREATININE: Creatinine, Ser: 0.8 mg/dL (ref 0.44–1.00)

## 2019-01-10 MED ORDER — GADOBUTROL 1 MMOL/ML IV SOLN
10.0000 mL | Freq: Once | INTRAVENOUS | Status: AC | PRN
  Administered 2019-01-10: 10 mL via INTRAVENOUS

## 2019-01-11 ENCOUNTER — Encounter: Payer: Self-pay | Admitting: Obstetrics and Gynecology

## 2019-01-11 ENCOUNTER — Ambulatory Visit (INDEPENDENT_AMBULATORY_CARE_PROVIDER_SITE_OTHER): Payer: Medicare Other | Admitting: Internal Medicine

## 2019-01-11 ENCOUNTER — Other Ambulatory Visit: Payer: Self-pay | Admitting: Obstetrics and Gynecology

## 2019-01-11 ENCOUNTER — Ambulatory Visit (INDEPENDENT_AMBULATORY_CARE_PROVIDER_SITE_OTHER): Payer: Medicare Other | Admitting: Obstetrics and Gynecology

## 2019-01-11 ENCOUNTER — Other Ambulatory Visit: Payer: Self-pay

## 2019-01-11 VITALS — BP 124/72 | HR 73 | Ht 62.5 in | Wt 184.0 lb

## 2019-01-11 DIAGNOSIS — Z8742 Personal history of other diseases of the female genital tract: Secondary | ICD-10-CM | POA: Diagnosis not present

## 2019-01-11 DIAGNOSIS — N950 Postmenopausal bleeding: Secondary | ICD-10-CM | POA: Diagnosis not present

## 2019-01-11 NOTE — Progress Notes (Signed)
Patient ID: Jessica Russell, female   DOB: Dec 23, 1927, 85 y.o.   MRN: [id number]  S: Hasn't noticed cystocele until today when seen by doctor. Hasn't had pessary in for quite a while, has no vaginal bleeding or issue and has remained cleaned. Would like to leave cystocele alone and go for as long as possible before trying another pessary.   Patient given informed consent, signed copy in the chart, time out was performed. Appropriate time out taken. . The patient was placed in the lithotomy position and the cervix brought into view with sterile speculum.  Portio of cervix cleansed x 2 with betadine swabs.  A tenaculum was placed in the anterior lip of the cervix.  The uterus was sounded for depth of 8 cm. A pipelle was introduced to into the uterus, suction created,  and an endometrial sample was obtained. All equipment was removed and accounted for.  The patient tolerated the procedure well.   A: Cystocele to the entrance 2nd degree uterine decensus   Patient given post procedure instructions. The patient will return in 2 weeks for results.  At this time [name] is not inclined to get another pessary   By signing my name below, I, Samul Dada, attest that this documentation has been prepared under the direction and in the presence of Jonnie Kind, MD. Electronically Signed: [place]. 01/11/19. 2:02 PM.  I personally performed the services described in this documentation, which was SCRIBED in my presence. The recorded information has been reviewed and considered accurate. It has been edited as necessary during review. Jonnie Kind, MD

## 2019-01-13 DIAGNOSIS — R5382 Chronic fatigue, unspecified: Secondary | ICD-10-CM | POA: Diagnosis not present

## 2019-01-13 DIAGNOSIS — R768 Other specified abnormal immunological findings in serum: Secondary | ICD-10-CM | POA: Diagnosis not present

## 2019-01-13 DIAGNOSIS — M0579 Rheumatoid arthritis with rheumatoid factor of multiple sites without organ or systems involvement: Secondary | ICD-10-CM | POA: Diagnosis not present

## 2019-01-13 DIAGNOSIS — M2550 Pain in unspecified joint: Secondary | ICD-10-CM | POA: Diagnosis not present

## 2019-01-13 DIAGNOSIS — Z6832 Body mass index (BMI) 32.0-32.9, adult: Secondary | ICD-10-CM | POA: Diagnosis not present

## 2019-01-13 DIAGNOSIS — E669 Obesity, unspecified: Secondary | ICD-10-CM | POA: Diagnosis not present

## 2019-01-18 DIAGNOSIS — M5412 Radiculopathy, cervical region: Secondary | ICD-10-CM | POA: Diagnosis not present

## 2019-01-18 DIAGNOSIS — M9901 Segmental and somatic dysfunction of cervical region: Secondary | ICD-10-CM | POA: Diagnosis not present

## 2019-01-19 ENCOUNTER — Encounter (HOSPITAL_COMMUNITY): Payer: Medicare Other

## 2019-01-20 ENCOUNTER — Other Ambulatory Visit: Payer: Self-pay

## 2019-01-20 ENCOUNTER — Other Ambulatory Visit (HOSPITAL_COMMUNITY): Payer: Self-pay | Admitting: Cardiovascular Disease

## 2019-01-20 ENCOUNTER — Ambulatory Visit (HOSPITAL_COMMUNITY)
Admission: RE | Admit: 2019-01-20 | Discharge: 2019-01-20 | Disposition: A | Discharge status: Home / Self Care | Payer: Medicare Other | Source: Ambulatory Visit | Attending: Cardiology | Admitting: Cardiology

## 2019-01-20 DIAGNOSIS — I739 Peripheral vascular disease, unspecified: Secondary | ICD-10-CM | POA: Insufficient documentation

## 2019-01-20 DIAGNOSIS — Z9862 Peripheral vascular angioplasty status: Secondary | ICD-10-CM

## 2019-01-23 ENCOUNTER — Ambulatory Visit (INDEPENDENT_AMBULATORY_CARE_PROVIDER_SITE_OTHER): Payer: Medicare Other | Admitting: Otolaryngology

## 2019-01-31 ENCOUNTER — Encounter: Payer: Self-pay | Admitting: Cardiovascular Disease

## 2019-01-31 ENCOUNTER — Telehealth (INDEPENDENT_AMBULATORY_CARE_PROVIDER_SITE_OTHER): Payer: Medicare Other | Admitting: Cardiovascular Disease

## 2019-01-31 VITALS — Ht 62.5 in | Wt 177.5 lb

## 2019-01-31 DIAGNOSIS — I739 Peripheral vascular disease, unspecified: Secondary | ICD-10-CM

## 2019-01-31 DIAGNOSIS — E785 Hyperlipidemia, unspecified: Secondary | ICD-10-CM

## 2019-01-31 NOTE — Progress Notes (Signed)
Virtual Visit via Telephone Note   This visit type was conducted due to national recommendations for restrictions regarding the COVID-19 Pandemic (e.g. social distancing) in an effort to limit this patient's exposure and mitigate transmission in our community.  Due to her co-morbid illnesses, this patient is at least at moderate risk for complications without adequate follow up.  This format is felt to be most appropriate for this patient at this time.  The patient did not have access to video technology/had technical difficulties with video requiring transitioning to audio format only (telephone).  All issues noted in this document were discussed and addressed.  No physical exam could be performed with this format.  Please refer to the patient's chart for her  consent to telehealth for [facility].   Date:  01/31/2019   ID:  [name], DOB 03/07/1928, MRN [id number]  Patient Location: Home Provider Location: Office  PCP:  Doree Albee, MD  Cardiologist:  Kathlyn Sacramento, MD  Electrophysiologist:  None   Evaluation Performed:  Follow-Up Visit  Chief Complaint: Doing well with no complaints  History of Present Illness:    Jessica Russell is a 85 y.o. female who was reached via phone for a follow-up visit regarding peripheral arterial disease. She has known history of diabetes mellitus with peripheral neuropathy, rheumatoid arthritis and hyperlipidemia.  She was referred last year by Dr. Fritzi Mandes for evaluation of peripheral arterial disease and nonhealing ulceration on the right foot.  ABI was 1.10 on the right and 1.06 on the left.  Toe brachial index was abnormal on the right at 0.46 and normal on the left.   I proceeded with angiography in December 2019 which showed no significant aortoiliac disease.  On the right side, there was severe stenosis in the mid anterior tibial artery with occluded dorsalis pedis distally, occluded TP trunk with reconstitution of the peroneal  artery proximally via extensive collaterals from the anterior tibial and reconstitution of the distal posterior tibial artery via collaterals from the peroneal artery.  I performed successful balloon angioplasty to the anterior tibial artery. She ultimately underwent resection of first transmetatarsal bone with complete healing.  Recent vascular studies showed normal ABI bilaterally with patent anterior tibial artery.  She is doing well with no foot pain or ulceration.  No chest pain or shortness of breath.   The patient does not have symptoms concerning for COVID-19 infection (fever, chills, cough, or new shortness of breath).    Past Medical History:  Diagnosis Date  . Diabetic nephropathy ([place])   . Foot ulcer, right, with fat layer exposed ([place])    non-healing  . History of bilateral breast cancer 04-14-2018 per pt no recurrence   dx 2000 w/ right breast cancer, s/p  lumpectomy with node dissection completed radiation/  dx 2002 w/ left breast cancer, s/p lumpectomy with node dissection,  completed radiation  . History of diverticulitis of colon 2009   s/p  partial colectomy for perforated diverticulitis  . History of external beam radiation therapy    2000 for right breast cancer;   2002  for left breast cancer  (per pt 36 treatment for each breast)  . HLD (hyperlipidemia) 03/24/2017  . Neuropathy   . OA (osteoarthritis)   . Osteoporosis 04/14/2017  . Peripheral arterial occlusive disease ([place])    04-13-2018   s/p  balloon angioplasty to the anterior tibial artery for severe stenosis right lower extremity (dr Fletcher Anon)  . Type 2 diabetes mellitus ([place])  Past Surgical History:  Procedure Laterality Date  . ABDOMINAL AORTOGRAM W/LOWER EXTREMITY N/A 04/13/2018   Procedure: ABDOMINAL AORTOGRAM W/LOWER EXTREMITY;  Surgeon: [name], MD;  Location: [facility];  Service: Cardiovascular;  Laterality: N/A;  . APPENDECTOMY  age 31  . BREAST LUMPECTOMY WITH AXILLARY LYMPH NODE  DISSECTION Bilateral right 2000;   left 2002  . BUNIONECTOMY Bilateral 1968  . CARPAL TUNNEL RELEASE Right 2017  . CATARACT EXTRACTION W/PHACO Right 08/30/2017   Procedure: CATARACT EXTRACTION PHACO AND INTRAOCULAR LENS PLACEMENT RIGHT EYE;  Surgeon: [name], MD;  [facility];  Service: Ophthalmology;  Laterality: Right;  CDE: 13.63  . CATARACT EXTRACTION W/PHACO Left 09/20/2017   Procedure: CATARACT EXTRACTION PHACO AND INTRAOCULAR LENS PLACEMENT LEFT EYE;  Surgeon: [name], MD;  [facility];  Service: Ophthalmology;  Laterality: Left;  CDE: 11.25  . D & C HYSTEROSCOPY W/ RESECTION POLYP  2017   per pt benign  . FOOT SURGERY     removal of two bones  . FRACTURE SURGERY Left 05/12/2016   patella fracture,   per pt no retatined hardware  . GRAFT APPLICATION N/A [id number]   Procedure: APPLICATION OF STRAVIX GRAFT;  Surgeon: [name], DPM;  Location: [facility];  Service: Podiatry;  Laterality: N/A;  . HEMICOLECTOMY  2009   diverticulitis w/ perforation  . METATARSAL HEAD EXCISION N/A 04/18/2018   Procedure: FIRST METATARSAL HEAD RECESSION, WOUND DEBRIEDMENT;  Surgeon: [name], DPM;  Location: [facility];  Service: Podiatry;  Laterality: N/A;  . NASAL SEPTOPLASTY W/ TURBINOPLASTY Bilateral 01/04/2018   Procedure: NASAL SEPTOPLASTY WITH BILATERAL TURBINATE REDUCTION;  Surgeon: [name], MD;  Location: [facility];  Service: ENT;  Laterality: Bilateral;  . PERIPHERAL VASCULAR BALLOON ANGIOPLASTY  04/13/2018   Procedure: PERIPHERAL VASCULAR BALLOON ANGIOPLASTY;  Surgeon: [name], MD;  Location: [facility];  Service: Cardiovascular;;  Anterior Tibial  . SKIN GRAFT     left foot   . VENTRAL HERNIA REPAIR  01-07-2016    [name], [place]   w/ mesh     Current Meds  Medication Sig  . aspirin EC 81 MG tablet Take 1 tablet (81 mg total) by mouth daily.  . fluocinonide gel (LIDEX) [id number] % Apply 1 application  topically as needed (mouth sores).   . folic acid (FOLVITE) 1 MG tablet Take 1 mg by mouth daily.  [name] gabapentin (NEURONTIN) 400 MG capsule Take 2 capsules (800 mg total) by mouth 4 (four) times daily. BREAKFAST, LUNCH, DINNER, & BEDTIME. (Patient taking differently: Take 800 mg by mouth 4 (four) times daily. 2 BREAKFAST, 2 LUNCH, 2 DINNER, & 2 BEDTIME.)  . lidocaine (LMX) 4 % cream Apply 1 application topically as needed (pain).  . metFORMIN (GLUCOPHAGE) 500 MG tablet Take 500 mg by mouth daily.  . methotrexate (RHEUMATREX) 2.5 MG tablet Take 15 mg by mouth once a week. Caution:Chemotherapy. Protect from light.  . Methylsulfonylmethane (MSM PO) Take 1,000 mg by mouth 3 (three) times daily.  . metroNIDAZOLE (METROGEL) 0.75 % vaginal gel Place 1 Applicatorful vaginally at bedtime.  . Multiple Vitamins-Minerals (CENTRUM SILVER PO) Take by mouth daily.  . Polyvinyl Alcohol-Povidone (REFRESH OP) Apply to eye 2 (two) times daily as needed.  . Pramoxine-Dimethicone (GOLD BOND INTENSIVE HEALING EX) Apply 1 application topically daily.  . Vitamin D, Ergocalciferol, (DRISDOL) 1.25 MG (50000 UT) CAPS capsule Take 100,000 Units by mouth daily.      Allergies:  Phenergan [promethazine hcl], Ciprofloxacin, and Levofloxacin   Social History   Tobacco Use  . Smoking status: Former Smoker    Packs/day: 1.00    Years: 12.00    Pack years: 12.00    Types: Cigarettes    Start date: 05/11/1950    Quit date: 05/12/1963    Years since quitting: 55.7  . Smokeless tobacco: Never Used  Substance Use Topics  . Alcohol use: No    Frequency: Never  . Drug use: No     Family Hx: The patient's family history includes Alcohol abuse in her father; Arthritis in her mother; Diabetes in her mother; Early death in her father; Heart disease in her mother; Hypertension in her mother; Miscarriages / Stillbirths in her mother; Stroke in her father.  ROS:   Please see the history of present illness.     All other systems  reviewed and are negative.   Prior CV studies:   The following studies were reviewed today:    Labs/Other Tests and Data Reviewed:    EKG:  No ECG reviewed.  Recent Labs: 04/05/2018: BUN 15; Hemoglobin 11.8; Platelets 214; Potassium 4.7; Sodium 142 01/10/2019: Creatinine, Ser 0.80   Recent Lipid Panel No results found for: CHOL, TRIG, HDL, CHOLHDL, LDLCALC, LDLDIRECT  Wt Readings from Last 3 Encounters:  01/31/19 177 lb 8 oz (80.5 kg)  01/11/19 184 lb (83.5 kg)  12/28/18 184 lb 12.8 oz (83.8 kg)     Objective:    Vital Signs:  Ht 5' 2.5" (1.588 m)   Wt 177 lb 8 oz (80.5 kg)   BMI 31.95 kg/m    VITAL SIGNS:  reviewed  ASSESSMENT & PLAN:    1.  Peripheral arterial disease with previous nonhealing ulceration on the bottom of the right big toe: Status post balloon angioplasty of the right anterior tibial artery.  The patient has healed completely with no open ulceration.  Currently with no claudication.  Recent duplex showed patent anterior tibial artery.  2.  Hyperlipidemia: I do not have results of her lipid profile but given peripheral arterial disease and diabetes, treatment with a statin should be considered to achieve a target LDL of less than 70.  COVID-19 Education: The signs and symptoms of COVID-19 were discussed with the patient and how to seek care for testing (follow up with PCP or arrange E-visit).  The importance of social distancing was discussed today.  Time:   Today, I have spent 6 minutes with the patient with telehealth technology discussing the above problems.     Medication Adjustments/Labs and Tests Ordered: Current medicines are reviewed at length with the patient today.  Concerns regarding medicines are outlined above.   Tests Ordered: No orders of the defined types were placed in this encounter.   Medication Changes: No orders of the defined types were placed in this encounter.   Follow Up:  In Person in 6 month(s)  Signed, Kathlyn Sacramento, MD  01/31/2019 9:10 AM    [place]

## 2019-01-31 NOTE — Patient Instructions (Signed)
Medication Instructions:  Your physician recommends that you continue on your current medications as directed. Please refer to the Current Medication list given to you today.  If you need a refill on your cardiac medications before your next appointment, please call your pharmacy.   Lab work: None ordered If you have labs (blood work) drawn today and your tests are completely normal, you will receive your results only by: MyChart Message (if you have MyChart) OR A paper copy in the mail If you have any lab test that is abnormal or we need to change your treatment, we will call you to review the results.  Testing/Procedures: None ordered  Follow-Up: At [facility], you and your health needs are our priority.  As part of our continuing mission to provide you with exceptional heart care, we have created designated Provider Care Teams.  These Care Teams include your primary Cardiologist (physician) and Advanced Practice Providers (APPs -  Physician Assistants and Nurse Practitioners) who all work together to provide you with the care you need, when you need it. You will need a follow up appointment in 6 months.  Please call our office 2 months in advance to schedule this appointment.  You may see Muhammad Arida, MD or one of the following Advanced Practice Providers on your designated Care Team:   Luke Kilroy, PA-C Krista Kroeger, PA-C Callie Goodrich, PA-C      

## 2019-02-06 ENCOUNTER — Telehealth: Payer: Self-pay | Admitting: *Deleted

## 2019-02-06 NOTE — Telephone Encounter (Signed)
Pt aware of biopsy results and voiced understanding. [place]

## 2019-02-06 NOTE — Telephone Encounter (Signed)
Call for results

## 2019-02-09 ENCOUNTER — Ambulatory Visit (INDEPENDENT_AMBULATORY_CARE_PROVIDER_SITE_OTHER): Payer: Medicare Other | Admitting: Otolaryngology

## 2019-02-09 ENCOUNTER — Other Ambulatory Visit: Payer: Self-pay

## 2019-02-09 DIAGNOSIS — D333 Benign neoplasm of cranial nerves: Secondary | ICD-10-CM | POA: Diagnosis not present

## 2019-02-09 DIAGNOSIS — H9041 Sensorineural hearing loss, unilateral, right ear, with unrestricted hearing on the contralateral side: Secondary | ICD-10-CM

## 2019-02-13 ENCOUNTER — Other Ambulatory Visit: Payer: Self-pay

## 2019-02-13 ENCOUNTER — Ambulatory Visit (INDEPENDENT_AMBULATORY_CARE_PROVIDER_SITE_OTHER): Payer: Medicare Other

## 2019-02-14 ENCOUNTER — Ambulatory Visit (INDEPENDENT_AMBULATORY_CARE_PROVIDER_SITE_OTHER): Payer: Medicare Other

## 2019-02-15 DIAGNOSIS — M9901 Segmental and somatic dysfunction of cervical region: Secondary | ICD-10-CM | POA: Diagnosis not present

## 2019-02-15 DIAGNOSIS — M5412 Radiculopathy, cervical region: Secondary | ICD-10-CM | POA: Diagnosis not present

## 2019-03-07 DIAGNOSIS — E669 Obesity, unspecified: Secondary | ICD-10-CM | POA: Diagnosis not present

## 2019-03-07 DIAGNOSIS — R5382 Chronic fatigue, unspecified: Secondary | ICD-10-CM | POA: Diagnosis not present

## 2019-03-07 DIAGNOSIS — M2550 Pain in unspecified joint: Secondary | ICD-10-CM | POA: Diagnosis not present

## 2019-03-07 DIAGNOSIS — Z6832 Body mass index (BMI) 32.0-32.9, adult: Secondary | ICD-10-CM | POA: Diagnosis not present

## 2019-03-07 DIAGNOSIS — M0579 Rheumatoid arthritis with rheumatoid factor of multiple sites without organ or systems involvement: Secondary | ICD-10-CM | POA: Diagnosis not present

## 2019-03-08 ENCOUNTER — Other Ambulatory Visit (INDEPENDENT_AMBULATORY_CARE_PROVIDER_SITE_OTHER): Payer: Self-pay | Admitting: Internal Medicine

## 2019-03-08 DIAGNOSIS — E1142 Type 2 diabetes mellitus with diabetic polyneuropathy: Secondary | ICD-10-CM

## 2019-03-13 ENCOUNTER — Encounter (INDEPENDENT_AMBULATORY_CARE_PROVIDER_SITE_OTHER): Payer: Self-pay | Admitting: Internal Medicine

## 2019-03-13 ENCOUNTER — Other Ambulatory Visit: Payer: Self-pay

## 2019-03-13 ENCOUNTER — Ambulatory Visit (INDEPENDENT_AMBULATORY_CARE_PROVIDER_SITE_OTHER): Payer: Medicare Other | Admitting: Internal Medicine

## 2019-03-13 VITALS — BP 120/60 | HR 72 | Ht 63.0 in | Wt 186.8 lb

## 2019-03-13 DIAGNOSIS — M069 Rheumatoid arthritis, unspecified: Secondary | ICD-10-CM

## 2019-03-13 DIAGNOSIS — E782 Mixed hyperlipidemia: Secondary | ICD-10-CM | POA: Diagnosis not present

## 2019-03-13 DIAGNOSIS — E559 Vitamin D deficiency, unspecified: Secondary | ICD-10-CM | POA: Diagnosis not present

## 2019-03-13 DIAGNOSIS — E1142 Type 2 diabetes mellitus with diabetic polyneuropathy: Secondary | ICD-10-CM

## 2019-03-13 DIAGNOSIS — M810 Age-related osteoporosis without current pathological fracture: Secondary | ICD-10-CM

## 2019-03-13 HISTORY — DX: Vitamin D deficiency, unspecified: E55.9

## 2019-03-13 HISTORY — DX: Rheumatoid arthritis, unspecified: M06.9

## 2019-03-13 NOTE — Progress Notes (Signed)
Metrics: Intervention Frequency ACO  Documented Smoking Status Yearly  Screened one or more times in 24 months  Cessation Counseling or  Active cessation medication Past 24 months  Past 24 months   Guideline developer: UpToDate (See UpToDate for funding source) Date Released: 2014       Wellness Office Visit  Subjective:  Patient ID: Jessica Russell, female    DOB: 07-02-1927  Age: 85 y.o. MRN: [id number]  CC: This lady comes in for follow-up of diabetes with neuropathy, rheumatoid arthritis, vitamin D deficiency and osteoporosis. HPI  She is doing reasonably well.  She suffers significantly with her rheumatoid arthritis and now is taking methotrexate. She continues with Metformin for diabetes and her hemoglobin A1c was not too bad less than 7% on the last visit.  She denies any chest pain, dyspnea, palpitations, polyuria or polydipsia. She continues on vitamin D3 supplementation for vitamin D deficiency. Past Medical History:  Diagnosis Date  . Diabetic nephropathy ([place])   . Foot ulcer, right, with fat layer exposed ([place])    non-healing  . History of bilateral breast cancer 04-14-2018 per pt no recurrence   dx 2000 w/ right breast cancer, s/p  lumpectomy with node dissection completed radiation/  dx 2002 w/ left breast cancer, s/p lumpectomy with node dissection,  completed radiation  . History of diverticulitis of colon 2009   s/p  partial colectomy for perforated diverticulitis  . History of external beam radiation therapy    2000 for right breast cancer;   2002  for left breast cancer  (per pt 36 treatment for each breast)  . HLD (hyperlipidemia) 03/24/2017  . Neuropathy   . OA (osteoarthritis)   . Osteoporosis 04/14/2017  . Peripheral arterial occlusive disease ([place])    04-13-2018   s/p  balloon angioplasty to the anterior tibial artery for severe stenosis right lower extremity (dr Fletcher Anon)  . Rheumatoid arthritis ([place]) 03/13/2019  . Type 2 diabetes mellitus ([place])   .  Vitamin D deficiency disease 03/13/2019      Family History  Problem Relation Age of Onset  . Diabetes Mother   . Heart disease Mother   . Arthritis Mother   . Hypertension Mother   . Miscarriages / [place] Mother   . Early death Father   . Stroke Father   . Alcohol abuse Father     Social History   Social History Narrative   Lives with husband [name]   One son [name] lives in [place]    Father was a [place] from [place], patient is bilingual   Married for over 29 years.    Caffeine use: 1 cup per day   Right handed   Social History   Tobacco Use  . Smoking status: Former Smoker    Packs/day: 1.00    Years: 12.00    Pack years: 12.00    Types: Cigarettes    Start date: 05/11/1950    Quit date: 05/12/1963    Years since quitting: 55.8  . Smokeless tobacco: Never Used  Substance Use Topics  . Alcohol use: No    Frequency: Never    Current Meds  Medication Sig  . aspirin EC 81 MG tablet Take 1 tablet (81 mg total) by mouth daily.  . fluocinonide gel (LIDEX) AB-123456789 % Apply 1 application topically as needed (mouth sores).   . folic acid (FOLVITE) 1 MG tablet Take 1 mg by mouth daily.  [name] gabapentin (NEURONTIN) 400 MG capsule Take 2 capsules (800 mg total) by  mouth 4 (four) times daily. 2 BREAKFAST, 2 LUNCH, 2 DINNER, & 2 BEDTIME.  [name] lidocaine (LMX) 4 % cream Apply 1 application topically as needed (pain).  . metFORMIN (GLUCOPHAGE) 500 MG tablet Take 500 mg by mouth daily.  . methotrexate (RHEUMATREX) 2.5 MG tablet Take 15 mg by mouth once a week. Caution:Chemotherapy. Protect from light.  . Multiple Vitamins-Minerals (CENTRUM SILVER PO) Take by mouth daily.  . Polyvinyl Alcohol-Povidone (REFRESH OP) Apply to eye 2 (two) times daily as needed.  . Vitamin D, Ergocalciferol, (DRISDOL) 1.25 MG (50000 UT) CAPS capsule Take 100,000 Units by mouth daily.       Objective:   Today's Vitals: BP 120/60   Pulse 72   Ht 5\' 3"  (1.6 m)   Wt 186 lb 12.8 oz (84.7 kg)   BMI  33.09 kg/m  Vitals with BMI 03/13/2019 01/31/2019 01/11/2019  Height 5\' 3"  5' 2.5" 5' 2.5"  Weight 186 lbs 13 oz 177 lbs 8 oz 184 lbs  BMI 33.1 [id number] [id number]  Systolic 123456 (No Data) A999333  Diastolic 60 (No Data) 72  Pulse 72 - 73     Physical Exam    She looks systemically well.  She remains obese.  Blood pressure is well controlled.  She is alert and orientated without any obvious focal neurological signs.   Assessment   1. Diabetic polyneuropathy associated with type 2 diabetes mellitus ([place])   2. Mixed hyperlipidemia   3. Vitamin D deficiency disease   4. Age-related osteoporosis without current pathological fracture   5. Rheumatoid arthritis involving both hands, unspecified whether rheumatoid factor present ([place])       Tests ordered Orders Placed This Encounter  Procedures  . COMPLETE METABOLIC PANEL WITH GFR  . Hemoglobin A1c  . VITAMIN D 25 Hydroxy (Vit-D Deficiency, Fractures)     Plan: 1. She will continue with Metformin for diabetes.  I think if her hemoglobin A1c is reasonable, this can be discontinued at her age. 2. She will continue with vitamin D3 supplementation. 3. For osteoporosis, she will continue to try to do weightbearing exercises. 4. Blood work is ordered as above. 5. Further recommendations will depend on blood results and I will have her follow-up with Sarah in about 3 months time for an annual Medicare wellness visit.   No orders of the defined types were placed in this encounter.   Doree Albee, MD

## 2019-03-14 LAB — COMPLETE METABOLIC PANEL WITH GFR
AG Ratio: 1.6 (calc) (ref 1.0–2.5)
ALT: 25 U/L (ref 6–29)
AST: 24 U/L (ref 10–35)
Albumin: 4 g/dL (ref 3.6–5.1)
Alkaline phosphatase (APISO): 72 U/L (ref 37–153)
BUN: 16 mg/dL (ref 7–25)
CO2: 24 mmol/L (ref 20–32)
Calcium: 9.9 mg/dL (ref 8.6–10.4)
Chloride: 106 mmol/L (ref 98–110)
Creat: 0.87 mg/dL (ref 0.60–0.88)
GFR, Est African American: 68 mL/min/{1.73_m2} (ref >=60)
GFR, Est Non African American: 59 mL/min/{1.73_m2} — ABNORMAL LOW (ref >=60)
Globulin: 2.5 g/dL (calc) (ref 1.9–3.7)
Glucose, Bld: 143 mg/dL — ABNORMAL HIGH (ref 65–99)
Potassium: 4.5 mmol/L (ref 3.5–5.3)
Sodium: 142 mmol/L (ref 135–146)
Total Bilirubin: 0.4 mg/dL (ref 0.2–1.2)
Total Protein: 6.5 g/dL (ref 6.1–8.1)

## 2019-03-14 LAB — HEMOGLOBIN A1C
Hgb A1c MFr Bld: 6.6 % of total Hgb — ABNORMAL HIGH (ref <5.7)
Mean Plasma Glucose: 143 (calc)
eAG (mmol/L): 7.9 (calc)

## 2019-03-14 LAB — VITAMIN D 25 HYDROXY (VIT D DEFICIENCY, FRACTURES): Vit D, 25-Hydroxy: 61 ng/mL (ref 30–100)

## 2019-03-29 DIAGNOSIS — M5412 Radiculopathy, cervical region: Secondary | ICD-10-CM | POA: Diagnosis not present

## 2019-03-29 DIAGNOSIS — M9901 Segmental and somatic dysfunction of cervical region: Secondary | ICD-10-CM | POA: Diagnosis not present

## 2019-05-18 DIAGNOSIS — L602 Onychogryphosis: Secondary | ICD-10-CM | POA: Diagnosis not present

## 2019-05-18 DIAGNOSIS — E1151 Type 2 diabetes mellitus with diabetic peripheral angiopathy without gangrene: Secondary | ICD-10-CM | POA: Diagnosis not present

## 2019-05-18 DIAGNOSIS — L84 Corns and callosities: Secondary | ICD-10-CM | POA: Diagnosis not present

## 2019-05-31 ENCOUNTER — Other Ambulatory Visit (INDEPENDENT_AMBULATORY_CARE_PROVIDER_SITE_OTHER): Payer: Self-pay | Admitting: Internal Medicine

## 2019-06-04 ENCOUNTER — Other Ambulatory Visit (INDEPENDENT_AMBULATORY_CARE_PROVIDER_SITE_OTHER): Payer: Self-pay | Admitting: Internal Medicine

## 2019-06-04 DIAGNOSIS — E1142 Type 2 diabetes mellitus with diabetic polyneuropathy: Secondary | ICD-10-CM

## 2019-06-27 ENCOUNTER — Ambulatory Visit (INDEPENDENT_AMBULATORY_CARE_PROVIDER_SITE_OTHER): Payer: Medicare Other | Admitting: Nurse Practitioner

## 2019-06-27 ENCOUNTER — Encounter (INDEPENDENT_AMBULATORY_CARE_PROVIDER_SITE_OTHER): Payer: Self-pay | Admitting: Nurse Practitioner

## 2019-06-27 ENCOUNTER — Other Ambulatory Visit: Payer: Self-pay

## 2019-06-27 VITALS — BP 135/65 | HR 72 | Temp 97.7°F | Ht 63.0 in | Wt 182.0 lb

## 2019-06-27 DIAGNOSIS — Z0000 Encounter for general adult medical examination without abnormal findings: Secondary | ICD-10-CM | POA: Diagnosis not present

## 2019-06-27 NOTE — Patient Instructions (Signed)
Thank you for choosing [facility] as your medical provider! If you have any questions or concerns regarding your health care, please do not hesitate to call our office.  We completed your annual Medicare wellness visit today.  Please see below for recommendations regarding timing for immunizations and screenings  Immunizations: 1.  Flu: You will be due for flu immunization again next flu season 2.  Pneumonia: You have completed both pneumonia vaccinations 3.  Tetanus: You will be due for tetanus vaccine again in 2029 4.  Covid: Please keep trying to get on a wait list for this vaccination  Screenings: 1.  Depression: You will be due for depression screening again next year 2.  Falls: You will be due for fall screening again next year 3.  Osteoporosis: If you like to be screened for osteoporosis please let me know and I can set this up for you.    Advanced care planning: Please bring in a copy of your living will and healthcare power of attorney paperwork so that we can get this added to your chart  Please follow-up as scheduled in 1 month. We look forward to seeing you again soon!   At [facility] we value your feedback. You may receive a survey about your visit today. Please share your experience as we strive to create trusting relationships with our patients to provide genuine, compassionate, quality care.  We appreciate your understanding and patience as we review any laboratory studies, imaging, and other diagnostic tests that are ordered as we care for you. We do our best to address any and all results in a timely manner. If you do not hear about test results within 1 week, please do not hesitate to contact us. If we referred you to a specialist during your visit or ordered imaging testing, contact the office if you have not been contacted to be scheduled within 1 weeks.  We also encourage the use of MyChart, which contains your medical information for your review  as well. If you are not enrolled in this feature, an access code is on this after visit summary for your convenience. Thank you for allowing [place] to be involved in your care.

## 2019-06-27 NOTE — [facility] Notes (Signed)
Subjective:   Jessica Russell is a 86 y.o. female who presents for Medicare Annual (Subsequent) preventive examination.  Review of Systems:   Cardiac Risk Factors include: advanced age (>34men, >37 women);diabetes mellitus;obesity (BMI >30kg/m2);sedentary lifestyle     Objective:     Vitals: BP 135/65 (BP Location: Left Arm, Patient Position: Sitting, Cuff Size: Normal)   Pulse 72   Temp 97.7 F (36.5 C) (Temporal)   Ht 5\' 3"  (1.6 m)   Wt 182 lb (82.6 kg)   SpO2 97%   BMI 32.24 kg/m   Body mass index is 32.24 kg/m.  Advanced Directives 06/27/2019 04/18/2018 04/13/2018 04/02/2018 01/04/2018 12/28/2017 09/20/2017  Does Patient Have a Medical Advance Directive? Yes Yes Yes No Yes Yes Yes  Type of Advance Directive Living will [place];Living will [place];Living will - Healthcare Power of [place]  Does patient want to make changes to medical advance directive? No - Patient declined - No - Patient declined - No - Patient declined - -  Copy of [place] in Chart? - No - copy requested Yes - validated most recent copy scanned in chart (See row information) - Yes No - copy requested No - copy requested  Would patient like information on creating a medical advance directive? - - No - Patient declined No - Patient declined - - -    Tobacco Social History   Tobacco Use  Smoking Status Former Smoker  . Packs/day: 1.00  . Years: 12.00  . Pack years: 12.00  . Types: Cigarettes  . Start date: 05/11/1950  . Quit date: 05/12/1963  . Years since quitting: 56.1  Smokeless Tobacco Never Used     Counseling given: Not Answered   Clinical Intake:  Pre-visit preparation completed: Yes  Pain : 0-10 Pain Score: 4  Pain Type: Neuropathic pain Pain Location: Foot Pain Orientation: Right Pain Descriptors / Indicators: Burning Pain Onset: More than a month ago Pain Frequency: Intermittent Pain  Relieving Factors: gabapentin Effect of Pain on Daily Activities: sitting for long periods of time, difficulty walking at times  Pain Relieving Factors: gabapentin  Nutritional Status: BMI > 30  Obese Nutritional Risks: None Diabetes: Yes CBG done?: No Did pt. bring in CBG monitor from home?: No  How often do you need to have someone help you when you read instructions, pamphlets, or other written materials from your doctor or pharmacy?: 1 - Never What is the last grade level you completed in school?: high school  Interpreter Needed?: No  Information entered by :: Awilda Bill, CMA  Past Medical History:  Diagnosis Date  . Diabetic nephropathy ([place])   . Foot ulcer, right, with fat layer exposed ([place])    non-healing  . History of bilateral breast cancer 04-14-2018 per pt no recurrence   dx 2000 w/ right breast cancer, s/p  lumpectomy with node dissection completed radiation/  dx 2002 w/ left breast cancer, s/p lumpectomy with node dissection,  completed radiation  . History of diverticulitis of colon 2009   s/p  partial colectomy for perforated diverticulitis  . History of external beam radiation therapy    2000 for right breast cancer;   2002  for left breast cancer  (per pt 36 treatment for each breast)  . HLD (hyperlipidemia) 03/24/2017  . Neuropathy   . OA (osteoarthritis)   . Osteoporosis 04/14/2017  . Peripheral arterial occlusive disease ([place])    04-13-2018   s/p  balloon angioplasty to the anterior tibial artery for severe stenosis right lower extremity (dr Fletcher Anon)  . Rheumatoid arthritis ([place]) 03/13/2019  . Type 2 diabetes mellitus ([place])   . Vitamin D deficiency disease 03/13/2019   Past Surgical History:  Procedure Laterality Date  . ABDOMINAL AORTOGRAM W/LOWER EXTREMITY N/A 04/13/2018   Procedure: ABDOMINAL AORTOGRAM W/LOWER EXTREMITY;  Surgeon: [name], MD;  Location: [facility];  Service: Cardiovascular;  Laterality: N/A;  . APPENDECTOMY  age  52  . BREAST LUMPECTOMY WITH AXILLARY LYMPH NODE DISSECTION Bilateral right 2000;   left 2002  . BUNIONECTOMY Bilateral 1968  . CARPAL TUNNEL RELEASE Right 2017  . CATARACT EXTRACTION W/PHACO Right 08/30/2017   Procedure: CATARACT EXTRACTION PHACO AND INTRAOCULAR LENS PLACEMENT RIGHT EYE;  Surgeon: [name], MD;  Location: [facility];  Service: Ophthalmology;  Laterality: Right;  CDE: 13.63  . CATARACT EXTRACTION W/PHACO Left 09/20/2017   Procedure: CATARACT EXTRACTION PHACO AND INTRAOCULAR LENS PLACEMENT LEFT EYE;  Surgeon: [name], MD;  Location: [facility];  Service: Ophthalmology;  Laterality: Left;  CDE: 11.25  . D & C HYSTEROSCOPY W/ RESECTION POLYP  2017   per pt benign  . FOOT SURGERY     removal of two bones  . FRACTURE SURGERY Left 05/12/2016   patella fracture,   per pt no retatined hardware  . GRAFT APPLICATION N/A [id number]   Procedure: APPLICATION OF STRAVIX GRAFT;  Surgeon: [name], DPM;  Location: [facility];  Service: Podiatry;  Laterality: N/A;  . HEMICOLECTOMY  2009   diverticulitis w/ perforation  . METATARSAL HEAD EXCISION N/A 04/18/2018   Procedure: FIRST METATARSAL HEAD RECESSION, WOUND DEBRIEDMENT;  Surgeon: [name], DPM;  Location: [facility];  Service: Podiatry;  Laterality: N/A;  . NASAL SEPTOPLASTY W/ TURBINOPLASTY Bilateral 01/04/2018   Procedure: NASAL SEPTOPLASTY WITH BILATERAL TURBINATE REDUCTION;  Surgeon: [name], MD;  Location: [facility];  Service: ENT;  Laterality: Bilateral;  . PERIPHERAL VASCULAR BALLOON ANGIOPLASTY  04/13/2018   Procedure: PERIPHERAL VASCULAR BALLOON ANGIOPLASTY;  Surgeon: [name], MD;  Location: [facility];  Service: Cardiovascular;;  Anterior Tibial  . SKIN GRAFT     left foot   . VENTRAL HERNIA REPAIR  01-07-2016    [name], [place]   w/ mesh   Family History  Problem Relation Age of Onset  . Diabetes Mother   . Heart disease Mother   . Arthritis Mother    . Hypertension Mother   . Miscarriages / [place] Mother   . Early death Father   . Stroke Father   . Alcohol abuse Father   . Diabetes Sister   . Stroke Sister   . Diabetes Brother   . Diverticulitis Son   . Diabetes Sister   . Stroke Sister    Social History   Socioeconomic History  . Marital status: Married    Spouse name: Not on file  . Number of children: 1  . Years of education: 44  . Highest education level: Not on file  Occupational History  . Occupation: retired  Tobacco Use  . Smoking status: Former Smoker    Packs/day: 1.00    Years: 12.00    Pack years: 12.00    Types: Cigarettes    Start date: 05/11/1950    Quit date: 05/12/1963    Years since quitting: 56.1  . Smokeless tobacco: Never Used  Substance and Sexual Activity  . Alcohol use: No  .  Drug use: No  . Sexual activity: Not Currently    Birth control/protection: Post-menopausal  Other Topics Concern  . Not on file  Social History Narrative   Lives with husband [name]   One son [name] lives in [place]    Father was a [place] from [place], patient is bilingual   Married for over 56 years.    Caffeine use: 1 cup per day   Right handed   Social Determinants of Health   Financial Resource Strain:   . Difficulty of Paying Living Expenses: Not on file  Food Insecurity:   . Worried About Charity fundraiser in the Last Year: Not on file  . Ran Out of Food in the Last Year: Not on file  Transportation Needs:   . Lack of Transportation (Medical): Not on file  . Lack of Transportation (Non-Medical): Not on file  Physical Activity:   . Days of Exercise per Week: Not on file  . Minutes of Exercise per Session: Not on file  Stress:   . Feeling of Stress : Not on file  Social Connections:   . Frequency of Communication with Friends and Family: Not on file  . Frequency of Social Gatherings with Friends and Family: Not on file  . Attends Religious Services: Not on file  . Active Member of Clubs or  Organizations: Not on file  . Attends Archivist Meetings: Not on file  . Marital Status: Not on file    Outpatient Encounter Medications as of 06/27/2019  Medication Sig  . aspirin EC 81 MG tablet Take 1 tablet (81 mg total) by mouth daily.  [name] co-enzyme Q-10 30 MG capsule Take 30 mg by mouth 3 (three) times daily.  . fluocinonide gel (LIDEX) [id number] % Apply 1 application topically as needed (mouth sores).   . folic acid (FOLVITE) 1 MG tablet Take 1 mg by mouth daily.  [name] gabapentin (NEURONTIN) 400 MG capsule TAKE 2 CAPSULES BY MOUTH 4 TIMES A DAY  . lidocaine (LMX) 4 % cream Apply 1 application topically as needed (pain).  . metFORMIN (GLUCOPHAGE) 500 MG tablet TAKE 1 TABLET BY MOUTH EVERY DAY  . methotrexate (RHEUMATREX) 2.5 MG tablet Take 15 mg by mouth once a week. Caution:Chemotherapy. Protect from light.  . Multiple Vitamins-Minerals (CENTRUM SILVER PO) Take by mouth daily.  . Polyvinyl Alcohol-Povidone (REFRESH OP) Apply to eye 2 (two) times daily as needed.  . Turmeric (QC TUMERIC COMPLEX PO) Take 1,000 mg by mouth daily.  . Vitamin D, Ergocalciferol, (DRISDOL) 1.25 MG (50000 UT) CAPS capsule Take 100,000 Units by mouth daily.   . [DISCONTINUED] metFORMIN (GLUCOPHAGE) 500 MG tablet Take 500 mg by mouth daily.   No facility-administered encounter medications on file as of 06/27/2019.    Activities of Daily Living In your present state of health, do you have any difficulty performing the following activities: 06/27/2019  Hearing? Y  Vision? N  Difficulty concentrating or making decisions? N  Walking or climbing stairs? Y  Dressing or bathing? N  Doing errands, shopping? Y  Comment cant drive  Preparing Food and eating ? N  Using the Toilet? N  In the past six months, have you accidently leaked urine? Y  Comment both  Do you have problems with loss of bowel control? Y  Managing your Medications? N  Managing your Finances? N  Housekeeping or managing your Housekeeping?  N  Some recent data might be hidden    Patient Care Team: Doree Albee,  MD as PCP - General (Internal Medicine) [name], MD as PCP - Cardiology (Cardiology)    Assessment:   This is a routine wellness examination for [place].  Exercise Activities and Dietary recommendations Current Exercise Habits: The patient does not participate in regular exercise at present, Exercise limited by: neurologic condition(s)  Goals    . HEMOGLOBIN A1C < 7.0       Fall Risk Fall Risk  06/27/2019 06/27/2019 12/12/2018 11/05/2017 10/12/2017  Falls in the past year? 0 0 0 No No  Number falls in past yr: 0 0 0 - -  Injury with Fall? 0 0 0 - -  Risk for fall due to : Impaired balance/gait - - - -  Follow up Falls prevention discussed;Falls evaluation completed - - - -   Is the patient's home free of loose throw rugs in walkways, pet beds, electrical cords, etc?   yes      Grab bars in the bathroom? yes      Handrails on the stairs?   no - N/A      Adequate lighting?   yes  Timed Get Up and Go performed: 25 seconds  Depression Screen PHQ 2/9 Scores 06/27/2019 06/27/2019 11/05/2017 10/12/2017  PHQ - 2 Score 1 0 0 0  Exception Documentation - Medical reason - -     Cognitive Function     6CIT Screen 06/27/2019  What Year? 0 points  What month? 0 points  What time? 0 points  Count back from 20 0 points  Months in reverse 0 points  Repeat phrase 0 points  Total Score 0    Immunization History  Administered Date(s) Administered  . Influenza,inj,Quad PF,6+ Mos 01/19/2018  . Influenza-Unspecified 03/11/2017  . Pneumococcal Conjugate-13 04/14/2013  . Pneumococcal Polysaccharide-23 04/20/2005, 01/19/2018  . Td 11/05/2017  . Zoster 07/17/2010  . Zoster Recombinat (Shingrix) 03/28/2019    Qualifies for Shingles Vaccine?  Already completed  Screening Tests Health Maintenance  Topic Date Due  . URINE MICROALBUMIN  10/14/2017  . FOOT EXAM  05/26/2018  . OPHTHALMOLOGY EXAM   05/22/2019  . HEMOGLOBIN A1C  09/10/2019  . TETANUS/TDAP  11/06/2027  . INFLUENZA VACCINE  Completed  . DEXA SCAN  Completed  . PNA vac Low Risk Adult  Completed    Cancer Screenings: Lung: Low Dose CT Chest recommended if Age 81-80 years, 30 pack-year currently smoking OR have quit w/in 15years. Patient does not qualify. Breast:  Up to date on Mammogram? No  -not applicable Up to date of Bone Density/Dexa? No -not applicable Colorectal: Not applicable  Additional Screenings:  Hepatitis C Screening: Not applicable     Plan:   Patient is up-to-date on pneumonia vaccines.  She will be due for flu shot again next flu season.  She will be due for tetanus vaccine again in 2029.  She has already had zoster vaccination.  She is due for Covid vaccine and is actively trying to get on the wait list for this.  She would be due for osteoporosis screening, but per shared decision making we have decided that she will forego this screening at this time.  She will be due for depression and fall screening again next year.  She has advanced care planning in place.  I have asked her to bring in copies of her healthcare power of attorney and living [place] we can add this to her chart here.  I have personally reviewed and noted the following in the patient's chart:   .  Medical and social history . Use of alcohol, tobacco or illicit drugs  . Current medications and supplements . Functional ability and status . Nutritional status . Physical activity . Advanced directives . List of other physicians . Hospitalizations, surgeries, and ER visits in previous 12 months . Vitals . Screenings to include cognitive, depression, and falls . Referrals and appointments  In addition, I have reviewed and discussed with patient certain preventive protocols, quality metrics, and best practice recommendations. A written personalized care plan for preventive services as well as general preventive health recommendations  were provided to patient.   She will follow-up in approximately 1 month.  She did mention that she has intermittent vaginal bleeding, that has been ongoing for many years.  She tells me she does have a history of a uterine polyp.  We will discuss this further at her next office visit in 1 month.  Rodney Langton, [place]  06/27/2019

## 2019-07-13 DIAGNOSIS — E669 Obesity, unspecified: Secondary | ICD-10-CM | POA: Diagnosis not present

## 2019-07-13 DIAGNOSIS — Z6831 Body mass index (BMI) 31.0-31.9, adult: Secondary | ICD-10-CM | POA: Diagnosis not present

## 2019-07-13 DIAGNOSIS — R5382 Chronic fatigue, unspecified: Secondary | ICD-10-CM | POA: Diagnosis not present

## 2019-07-13 DIAGNOSIS — M2550 Pain in unspecified joint: Secondary | ICD-10-CM | POA: Diagnosis not present

## 2019-07-13 DIAGNOSIS — M0579 Rheumatoid arthritis with rheumatoid factor of multiple sites without organ or systems involvement: Secondary | ICD-10-CM | POA: Diagnosis not present

## 2019-07-20 ENCOUNTER — Ambulatory Visit: Payer: Medicare Other | Attending: Internal Medicine

## 2019-07-20 DIAGNOSIS — Z23 Encounter for immunization: Secondary | ICD-10-CM

## 2019-07-20 NOTE — [facility] Notes (Signed)
   Covid-19 Vaccination Clinic  Name:  Jessica Russell    MRN: [id number] DOB: [birth year]  07/20/2019  [name] was observed post Covid-19 immunization for 15 minutes without incident. She was provided with Vaccine Information Sheet and instruction to access the V-Safe system.   [name] was instructed to call 911 with any severe reactions post vaccine: [name] Difficulty breathing  . Swelling of face and throat  . A fast heartbeat  . A bad rash all over body  . Dizziness and weakness   Immunizations Administered    Name Date Dose VIS Date Route   Moderna COVID-19 Vaccine 07/20/2019 11:28 AM 0.5 mL 04/11/2019 Intramuscular   Manufacturer: Moderna   Lot: [id number]   [id number]

## 2019-07-26 ENCOUNTER — Ambulatory Visit (INDEPENDENT_AMBULATORY_CARE_PROVIDER_SITE_OTHER): Payer: Medicare Other | Admitting: Nurse Practitioner

## 2019-07-26 ENCOUNTER — Other Ambulatory Visit: Payer: Self-pay

## 2019-07-26 ENCOUNTER — Encounter (INDEPENDENT_AMBULATORY_CARE_PROVIDER_SITE_OTHER): Payer: Self-pay | Admitting: Nurse Practitioner

## 2019-07-26 VITALS — BP 120/68 | HR 72 | Temp 97.2°F | Ht 62.0 in | Wt 183.0 lb

## 2019-07-26 DIAGNOSIS — E559 Vitamin D deficiency, unspecified: Secondary | ICD-10-CM | POA: Diagnosis not present

## 2019-07-26 DIAGNOSIS — M810 Age-related osteoporosis without current pathological fracture: Secondary | ICD-10-CM

## 2019-07-26 DIAGNOSIS — M1990 Unspecified osteoarthritis, unspecified site: Secondary | ICD-10-CM

## 2019-07-26 DIAGNOSIS — E119 Type 2 diabetes mellitus without complications: Secondary | ICD-10-CM | POA: Diagnosis not present

## 2019-07-26 DIAGNOSIS — N950 Postmenopausal bleeding: Secondary | ICD-10-CM

## 2019-07-26 NOTE — Progress Notes (Signed)
Subjective:  Patient ID: [name], female    DOB: Feb 23, 1928  Age: 86 y.o. MRN: [id number]  CC:  Chief Complaint  Patient presents with  . Diabetes  . Follow-up    Postmenopausal vaginal bleeding, osteoporosis, arthralgias      HPI  This patient comes in today for the above.  Diabetes: She has a history of diabetes.  She continues on Metformin 500 mg every evening.  Last A1c was 6.6 in November 2020.  She tells me at one point she stopped her metformin, but notes that her fasting blood sugars started to creep up, thus she restarted.  She does have polyneuropathy and continues on gabapentin 800 mg 4 times a day.  She is scheduled for eye doctor exam next month.  Arthralgias: She does mention to me she has multiple joints that are painful.  She does have a history of rheumatoid arthritis.  She did talk to her rheumatologist regarding this.  They recommended that in addition to her medication for her much arthritis that she take Tylenol as needed.  She tells me she has been doing this, and has noticed an improvement in her joint pain.  Postmenopausal vaginal bleeding: Per chart review I note that she was evaluated by OB/GYN in September 2020 (approximately 6 months ago).  At that time she underwent transvaginal ultrasound as well as endometrial biopsy.  A uterine polyp was noted, but endometrial biopsy was negative.  She tells me that she has had an episode of vaginal bleeding since these tests, but the bleeding stopped spontaneously and has not returned.  Osteoporosis/vitamin D deficiency: She continues on vitamin D3 supplementation, she is taking 10,000 IUs daily.  Last serum level was 61 back in November 2020.   Past Medical History:  Diagnosis Date  . Diabetic nephropathy ([place])   . Foot ulcer, right, with fat layer exposed ([place])    non-healing  . History of bilateral breast cancer 04-14-2018 per pt no recurrence   dx 2000 w/ right breast cancer, s/p  lumpectomy with  node dissection completed radiation/  dx 2002 w/ left breast cancer, s/p lumpectomy with node dissection,  completed radiation  . History of diverticulitis of colon 2009   s/p  partial colectomy for perforated diverticulitis  . History of external beam radiation therapy    2000 for right breast cancer;   2002  for left breast cancer  (per pt 36 treatment for each breast)  . HLD (hyperlipidemia) 03/24/2017  . Neuropathy   . OA (osteoarthritis)   . Osteoporosis 04/14/2017  . Peripheral arterial occlusive disease ([place])    04-13-2018   s/p  balloon angioplasty to the anterior tibial artery for severe stenosis right lower extremity (dr Fletcher Anon)  . Rheumatoid arthritis ([place]) 03/13/2019  . Type 2 diabetes mellitus ([place])   . Vitamin D deficiency disease 03/13/2019      Family History  Problem Relation Age of Onset  . Diabetes Mother   . Heart disease Mother   . Arthritis Mother   . Hypertension Mother   . Miscarriages / [place] Mother   . Early death Father   . Stroke Father   . Alcohol abuse Father   . Diabetes Sister   . Stroke Sister   . Diabetes Brother   . Diverticulitis Son   . Diabetes Sister   . Stroke Sister     Social History   Social History Narrative   Lives with husband [name]   One son [name]  lives in [place]    Father was a [place] from [place], patient is bilingual   Married for over 74 years.    Caffeine use: 1 cup per day   Right handed   Social History   Tobacco Use  . Smoking status: Former Smoker    Packs/day: 1.00    Years: 12.00    Pack years: 12.00    Types: Cigarettes    Start date: 05/11/1950    Quit date: 05/12/1963    Years since quitting: 56.2  . Smokeless tobacco: Never Used  Substance Use Topics  . Alcohol use: No     Current Meds  Medication Sig  . aspirin EC 81 MG tablet Take 1 tablet (81 mg total) by mouth daily.  . fluocinonide gel (LIDEX) 2.35 % Apply 1 application topically as needed (mouth sores).   . folic acid (FOLVITE) 1  MG tablet Take 1 mg by mouth daily.  [name] gabapentin (NEURONTIN) 400 MG capsule TAKE 2 CAPSULES BY MOUTH 4 TIMES A DAY  . lidocaine (LMX) 4 % cream Apply 1 application topically as needed (pain).  . metFORMIN (GLUCOPHAGE) 500 MG tablet TAKE 1 TABLET BY MOUTH EVERY DAY  . methotrexate (RHEUMATREX) 2.5 MG tablet Take 15 mg by mouth once a week. Caution:Chemotherapy. Protect from light.  . Multiple Vitamins-Minerals (CENTRUM SILVER PO) Take by mouth daily.  . Polyvinyl Alcohol-Povidone (REFRESH OP) Apply to eye 2 (two) times daily as needed.  . vitamin B-12 (CYANOCOBALAMIN) 500 MCG tablet Take 500 mcg by mouth daily.  . Vitamin D, Ergocalciferol, (DRISDOL) 1.25 MG (50000 UT) CAPS capsule Take 100,000 Units by mouth daily.     ROS:  Review of Systems  Constitutional: Negative for fever, malaise/fatigue and weight loss.  Eyes: Negative for blurred vision and double vision.  Respiratory: Negative for cough, shortness of breath and wheezing.   Cardiovascular: Negative for chest pain and palpitations.  Genitourinary: Positive for urgency (intermittent incontinence).  Musculoskeletal: Positive for joint pain and myalgias.  Neurological: Negative for dizziness and headaches.     Objective:   Today's Vitals: BP 120/68 (BP Location: Left Arm, Patient Position: Sitting, Cuff Size: Normal)   Pulse 72   Temp (!) 97.2 F (36.2 C) (Temporal)   Ht '5\' 2"'  (1.575 m)   Wt 183 lb (83 kg)   SpO2 97%   BMI 33.47 kg/m  Vitals with BMI 07/26/2019 06/27/2019 03/13/2019  Height '5\' 2"'  '5\' 3"'  '5\' 3"'   Weight 183 lbs 182 lbs 186 lbs 13 oz  BMI 33.46 36.14 43.1  Systolic 540 086 761  Diastolic 68 65 60  Pulse 72 72 72     Physical Exam Vitals reviewed.  Constitutional:      General: She is not in acute distress.    Appearance: Normal appearance.  HENT:     Head: Normocephalic and atraumatic.  Neck:     Vascular: No carotid bruit.  Cardiovascular:     Rate and Rhythm: Normal rate and regular rhythm.      Pulses: Normal pulses.     Heart sounds: Normal heart sounds.  Pulmonary:     Effort: Pulmonary effort is normal.     Breath sounds: Normal breath sounds.  Skin:    General: Skin is warm and dry.  Neurological:     General: No focal deficit present.     Mental Status: She is alert and oriented to person, place, and time.  Psychiatric:        Mood and Affect:  Mood normal.        Behavior: Behavior normal.        Judgment: Judgment normal.          Assessment and Plan   1. Vitamin D deficiency disease   2. Diabetes mellitus type II, non insulin dependent ([place])   3. Chronic osteoarthritis   4. Age-related osteoporosis without current pathological fracture   5. PMB (postmenopausal bleeding)      Plan: 1., 4.  We will collect serum level today for further evaluation.  2.  I will collect A1c today for further evaluation.  We will also see if we can get a urine sample to screen for albuminuria.  She will continue on her current medication regimen as prescribed.  We also discussed signs and symptoms of hypoglycemia and what to do if this were to occur.  He tells me she understands.  3.  I encouraged her to use Tylenol as needed as well.  I recommend that she does not exceed 3000 mg in 24-hour.  I also recommended that for any breakthrough pain she could take either an ibuprofen or Aleve as needed.  I did discuss that chronic use of ibuprofen, Aleve, or other NSAIDs can lead to increased risk of cardiovascular disease and renal disease.  She tells me she understands.  5.  As her symptoms have subsided and she has undergone evaluation within the last 6 months, I recommended that no further evaluation be completed at this time unless her symptoms return.  I especially told her if her symptoms return and appear to be different in any way such as heavier bleeding or bleeding that lasts longer than it has in the past, but she should notify her OB/GYN.  I also recommended that she should  discuss this with her OB/GYN at the 1 year mark of evaluation.  This would be this upcoming September, in case further evaluation is recommended.  She tells me she understands.   Tests ordered Orders Placed This Encounter  Procedures  . Hemoglobin A1c  . CMP with eGFR([facility])  . Vitamin D, 25-hydroxy  . Microalbumin/Creatinine Ratio, Urine      No orders of the defined types were placed in this encounter.   Patient to follow-up in 3 months  Ailene Ards, NP

## 2019-07-27 LAB — MICROALBUMIN / CREATININE URINE RATIO
Creatinine, Urine: 67 mg/dL (ref 20–275)
Microalb Creat Ratio: 27 mcg/mg creat (ref <30)
Microalb, Ur: 1.8 mg/dL

## 2019-07-27 LAB — COMPLETE METABOLIC PANEL WITH GFR
AG Ratio: 1.7 (calc) (ref 1.0–2.5)
ALT: 23 U/L (ref 6–29)
AST: 22 U/L (ref 10–35)
Albumin: 4.1 g/dL (ref 3.6–5.1)
Alkaline phosphatase (APISO): 64 U/L (ref 37–153)
BUN: 17 mg/dL (ref 7–25)
CO2: 24 mmol/L (ref 20–32)
Calcium: 10 mg/dL (ref 8.6–10.4)
Chloride: 106 mmol/L (ref 98–110)
Creat: 0.73 mg/dL (ref 0.60–0.88)
GFR, Est African American: 85 mL/min/{1.73_m2} (ref >=60)
GFR, Est Non African American: 73 mL/min/{1.73_m2} (ref >=60)
Globulin: 2.4 g/dL (calc) (ref 1.9–3.7)
Glucose, Bld: 108 mg/dL — ABNORMAL HIGH (ref 65–99)
Potassium: 4.4 mmol/L (ref 3.5–5.3)
Sodium: 142 mmol/L (ref 135–146)
Total Bilirubin: 0.3 mg/dL (ref 0.2–1.2)
Total Protein: 6.5 g/dL (ref 6.1–8.1)

## 2019-07-27 LAB — HEMOGLOBIN A1C
Hgb A1c MFr Bld: 6.5 % of total Hgb — ABNORMAL HIGH (ref <5.7)
Mean Plasma Glucose: 140 (calc)
eAG (mmol/L): 7.7 (calc)

## 2019-07-27 LAB — VITAMIN D 25 HYDROXY (VIT D DEFICIENCY, FRACTURES): Vit D, 25-Hydroxy: 71 ng/mL (ref 30–100)

## 2019-08-17 DIAGNOSIS — H33302 Unspecified retinal break, left eye: Secondary | ICD-10-CM | POA: Diagnosis not present

## 2019-08-23 ENCOUNTER — Ambulatory Visit: Payer: Medicare Other | Attending: Internal Medicine

## 2019-08-23 DIAGNOSIS — Z23 Encounter for immunization: Secondary | ICD-10-CM

## 2019-08-23 NOTE — Progress Notes (Signed)
   Covid-19 Vaccination Clinic  Name:  Jessica Russell    MRN: [id number] DOB: 02/22/1928  08/23/2019  Ms. [name] was observed post Covid-19 immunization for 15 minutes without incident. She was provided with Vaccine Information Sheet and instruction to access the V-Safe system.   Ms. [name] was instructed to call 911 with any severe reactions post vaccine: [name] Difficulty breathing  . Swelling of face and throat  . A fast heartbeat  . A bad rash all over body  . Dizziness and weakness   Immunizations Administered    Name Date Dose VIS Date Route   Moderna COVID-19 Vaccine 08/23/2019 11:27 AM 0.5 mL 04/11/2019 Intramuscular   Manufacturer: Moderna   Lot: [id number]   [id number]

## 2019-08-24 DIAGNOSIS — E1151 Type 2 diabetes mellitus with diabetic peripheral angiopathy without gangrene: Secondary | ICD-10-CM | POA: Diagnosis not present

## 2019-08-24 DIAGNOSIS — L602 Onychogryphosis: Secondary | ICD-10-CM | POA: Diagnosis not present

## 2019-08-24 DIAGNOSIS — L84 Corns and callosities: Secondary | ICD-10-CM | POA: Diagnosis not present

## 2019-09-02 ENCOUNTER — Other Ambulatory Visit (INDEPENDENT_AMBULATORY_CARE_PROVIDER_SITE_OTHER): Payer: Self-pay | Admitting: Internal Medicine

## 2019-09-02 DIAGNOSIS — E1142 Type 2 diabetes mellitus with diabetic polyneuropathy: Secondary | ICD-10-CM

## 2019-09-19 ENCOUNTER — Ambulatory Visit (INDEPENDENT_AMBULATORY_CARE_PROVIDER_SITE_OTHER): Payer: Medicare Other | Admitting: Cardiovascular Disease

## 2019-09-19 ENCOUNTER — Other Ambulatory Visit: Payer: Self-pay

## 2019-09-19 VITALS — BP 124/72 | HR 68 | Ht 62.5 in | Wt 179.0 lb

## 2019-09-19 DIAGNOSIS — E785 Hyperlipidemia, unspecified: Secondary | ICD-10-CM

## 2019-09-19 DIAGNOSIS — I739 Peripheral vascular disease, unspecified: Secondary | ICD-10-CM

## 2019-09-19 NOTE — Patient Instructions (Signed)
Medication Instructions:  No changes *If you need a refill on your cardiac medications before your next appointment, please call your pharmacy*   Lab Work: None ordered If you have labs (blood work) drawn today and your tests are completely normal, you will receive your results only by: [name] MyChart Message (if you have MyChart) OR . A paper copy in the mail If you have any lab test that is abnormal or we need to change your treatment, we will call you to review the results.   Testing/Procedures: Your physician has requested that you have a lower extremity arterial duplex in September. During this test, ultrasound is used to evaluate arterial blood flow in the legs. Allow one hour for this exam. There are no restrictions or special instructions. This will take place at [place], [address].  Your physician has requested that you have an ankle brachial index (ABI) in September. During this test an ultrasound and blood pressure cuff are used to evaluate the arteries that supply the arms and legs with blood. Allow thirty minutes for this exam. There are no restrictions or special instructions. This will take place at [place], [address].    Follow-Up: At [facility], you and your health needs are our priority.  As part of our continuing mission to provide you with exceptional heart care, we have created designated Provider Care Teams.  These Care Teams include your primary Cardiologist (physician) and Advanced Practice Providers (APPs -  Physician Assistants and Nurse Practitioners) who all work together to provide you with the care you need, when you need it.  We recommend signing up for the patient portal called "MyChart".  Sign up information is provided on this After Visit Summary.  MyChart is used to connect with patients for Virtual Visits (Telemedicine).  Patients are able to view lab/test results, encounter notes, upcoming appointments, etc.  Non-urgent messages can be  sent to your provider as well.   To learn more about what you can do with MyChart, go to [email or web address].    Your next appointment:   6 month(s)  The format for your next appointment:   In Person  Provider:   Kathlyn Sacramento, MD

## 2019-09-19 NOTE — Progress Notes (Signed)
Cardiology Office Note   Date:  09/19/2019   ID:  Jessica Russell, DOB [birth year], MRN [id number]  PCP:  Doree Albee, MD  Cardiologist:   Kathlyn Sacramento, MD   No chief complaint on file.     History of Present Illness: Jessica Russell is a 86 y.o. female who is here today for follow-up visit regarding peripheral arterial disease. She has known history of diabetes mellitus with peripheral neuropathy, rheumatoid arthritis and hyperlipidemia.  She was referred in 2019 by Dr. Fritzi Mandes for evaluation of peripheral arterial disease and nonhealing ulceration on the right foot.  ABI was 1.10 on the right and 1.06 on the left.  Toe brachial index was abnormal on the right at 0.46 and normal on the left.   I proceeded with angiography in December 2019 which showed no significant aortoiliac disease.  On the right side, there was severe stenosis in the mid anterior tibial artery with occluded dorsalis pedis distally, occluded TP trunk with reconstitution of the peroneal artery proximally via extensive collaterals from the anterior tibial and reconstitution of the distal posterior tibial artery via collaterals from the peroneal artery.  I performed successful balloon angioplasty to the anterior tibial artery. She ultimately underwent resection of first transmetatarsal bone with complete healing.    She has been doing well with no recent chest pain or shortness of breath.  No leg claudication or ulceration.  She does complain of significant numbness thought to be due to neuropathy.  She informs me that she was diagnosed with a caustic neuroma which is being observed for now.  Past Medical History:  Diagnosis Date  . Diabetic nephropathy (Jessica Russell)   . Foot ulcer, right, with fat layer exposed (Jessica Russell)    non-healing  . History of bilateral breast cancer 04-14-2018 per pt no recurrence   dx 2000 w/ right breast cancer, s/p  lumpectomy with node dissection completed radiation/  dx 2002 w/ left  breast cancer, s/p lumpectomy with node dissection,  completed radiation  . History of diverticulitis of colon 2009   s/p  partial colectomy for perforated diverticulitis  . History of external beam radiation therapy    2000 for right breast cancer;   2002  for left breast cancer  (per pt 36 treatment for each breast)  . HLD (hyperlipidemia) 03/24/2017  . Neuropathy   . OA (osteoarthritis)   . Osteoporosis 04/14/2017  . Peripheral arterial occlusive disease (Jessica Russell)    04-13-2018   s/p  balloon angioplasty to the anterior tibial artery for severe stenosis right lower extremity (dr Fletcher Anon)  . Rheumatoid arthritis ([place]) 03/13/2019  . Type 2 diabetes mellitus (Jessica Russell)   . Vitamin D deficiency disease 03/13/2019    Past Surgical History:  Procedure Laterality Date  . ABDOMINAL AORTOGRAM W/LOWER EXTREMITY N/A 04/13/2018   Procedure: ABDOMINAL AORTOGRAM W/LOWER EXTREMITY;  Surgeon: [name], MD;  Location: [facility];  Service: Cardiovascular;  Laterality: N/A;  . APPENDECTOMY  age 70  . BREAST LUMPECTOMY WITH AXILLARY LYMPH NODE DISSECTION Bilateral right 2000;   left 2002  . BUNIONECTOMY Bilateral 1968  . CARPAL TUNNEL RELEASE Right 2017  . CATARACT EXTRACTION W/PHACO Right 08/30/2017   Procedure: CATARACT EXTRACTION PHACO AND INTRAOCULAR LENS PLACEMENT RIGHT EYE;  Surgeon: [name], MD;  Location: [facility];  Service: Ophthalmology;  Laterality: Right;  CDE: 13.63  . CATARACT EXTRACTION W/PHACO Left 09/20/2017   Procedure: CATARACT EXTRACTION PHACO AND INTRAOCULAR LENS PLACEMENT LEFT EYE;  Surgeon: Geoffry Paradise,  Levada Dy, MD;  Location: [facility];  Service: Ophthalmology;  Laterality: Left;  CDE: 11.25  . D & C HYSTEROSCOPY W/ RESECTION POLYP  2017   per pt benign  . FOOT SURGERY     removal of two bones  . FRACTURE SURGERY Left 05/12/2016   patella fracture,   per pt no retatined hardware  . GRAFT APPLICATION N/A [id number]   Procedure: APPLICATION OF STRAVIX GRAFT;  Surgeon: [name],  DPM;  Location: [facility];  Service: Podiatry;  Laterality: N/A;  . HEMICOLECTOMY  2009   diverticulitis w/ perforation  . METATARSAL HEAD EXCISION N/A 04/18/2018   Procedure: FIRST METATARSAL HEAD RECESSION, WOUND DEBRIEDMENT;  Surgeon: [name], DPM;  Location: [facility];  Service: Podiatry;  Laterality: N/A;  . NASAL SEPTOPLASTY W/ TURBINOPLASTY Bilateral 01/04/2018   Procedure: NASAL SEPTOPLASTY WITH BILATERAL TURBINATE REDUCTION;  Surgeon: [name], MD;  Location: [facility];  Service: ENT;  Laterality: Bilateral;  . PERIPHERAL VASCULAR BALLOON ANGIOPLASTY  04/13/2018   Procedure: PERIPHERAL VASCULAR BALLOON ANGIOPLASTY;  Surgeon: [name], MD;  Location: [facility];  Service: Cardiovascular;;  Anterior Tibial  . SKIN GRAFT     left foot   . VENTRAL HERNIA REPAIR  01-07-2016    Jessica Russell, [place]   w/ mesh     Current Outpatient Medications  Medication Sig Dispense Refill  . aspirin EC 81 MG tablet Take 1 tablet (81 mg total) by mouth daily. 90 tablet 3  . fluocinonide gel (LIDEX) [id number] % Apply 1 application topically as needed (mouth sores).   0  . folic acid (FOLVITE) 1 MG tablet Take 1 mg by mouth daily.    [name] gabapentin (NEURONTIN) 400 MG capsule TAKE 2 CAPSULES BY MOUTH 4 TIMES A DAY 720 capsule 0  . lidocaine (LMX) 4 % cream Apply 1 application topically as needed (pain).    . metFORMIN (GLUCOPHAGE) 500 MG tablet TAKE 1 TABLET BY MOUTH EVERY DAY 90 tablet 1  . methotrexate (RHEUMATREX) 2.5 MG tablet Take 15 mg by mouth once a week. Caution:Chemotherapy. Protect from light.    . Multiple Vitamins-Minerals (CENTRUM SILVER PO) Take by mouth daily.    . Polyvinyl Alcohol-Povidone (REFRESH OP) Apply to eye 2 (two) times daily as needed.    . vitamin B-12 (CYANOCOBALAMIN) 500 MCG tablet Take 500 mcg by mouth daily.    . Vitamin D, Ergocalciferol, (DRISDOL) 1.25 MG (50000 UT) CAPS capsule Take 100,000 Units by mouth daily.       No current facility-administered medications for this visit.    Allergies:   Phenergan [promethazine hcl], Ciprofloxacin, and Levofloxacin    Social History:  The patient  reports that she quit smoking about 56 years ago. Her smoking use included cigarettes. She started smoking about 69 years ago. She has a 12.00 pack-year smoking history. She has never used smokeless tobacco. She reports that she does not drink alcohol or use drugs.   Family History:  The patient's family history includes Alcohol abuse in her father; Arthritis in her mother; Diabetes in her brother, mother, sister, and sister; Diverticulitis in her son; Early death in her father; Heart disease in her mother; Hypertension in her mother; Miscarriages / Stillbirths in her mother; Stroke in her father, sister, and sister.    ROS:  Please see the history of present illness.   Otherwise, review of systems are positive for none.   All other systems are reviewed and negative.  PHYSICAL EXAM: VS:  BP 124/72   Pulse 68   Ht 5' 2.5" (1.588 m)   Wt 179 lb (81.2 kg)   SpO2 98%   BMI 32.22 kg/m  , BMI Body mass index is 32.22 kg/m. GEN: Well nourished, well developed, in no acute distress  HEENT: normal  Neck: no JVD, carotid bruits, or masses Cardiac: RRR; no  rubs, or gallops,no edema .  2 out of 6 systolic murmur in the aortic area which is early peaking. Respiratory:  clear to auscultation bilaterally, normal work of breathing GI: soft, nontender, nondistended, + BS MS: no deformity or atrophy  Skin: warm and dry, no rash Neuro:  Strength and sensation are intact Psych: euthymic mood, full affect Vascular: Femoral pulses normal bilaterally.  Dorsalis pedis is palpable on the right side and absent on the left side.  Posterior tibial is absent on the right side but palpable on the left.     EKG:  EKG is ordered today. EKG showed normal sinus rhythm with low voltage.   Recent Labs: 07/26/2019: ALT 23; BUN 17;  Creat 0.73; Potassium 4.4; Sodium 142    Lipid Panel No results found for: CHOL, TRIG, HDL, CHOLHDL, VLDL, LDLCALC, LDLDIRECT    Wt Readings from Last 3 Encounters:  09/19/19 179 lb (81.2 kg)  07/26/19 183 lb (83 kg)  06/27/19 182 lb (82.6 kg)       No flowsheet data found.    ASSESSMENT AND PLAN:  1.  Peripheral arterial disease with previous nonhealing ulceration on the bottom of the right big toe: Status post balloon angioplasty of the right anterior tibial artery.  No recurrent ulceration or claudication.  She has a palpable dorsalis pedis on the right side.  Repeat vascular studies in September.   2.  Hyperlipidemia: I do not have results of her lipid profile but given peripheral arterial disease and diabetes, treatment with a statin should be considered to achieve a target LDL of less than 70.  Disposition:   FU with me in 6 months  Signed,  Kathlyn Sacramento, MD  09/19/2019 10:02 AM    [place]

## 2019-09-20 DIAGNOSIS — S134XXA Sprain of ligaments of cervical spine, initial encounter: Secondary | ICD-10-CM | POA: Diagnosis not present

## 2019-09-20 DIAGNOSIS — S338XXA Sprain of other parts of lumbar spine and pelvis, initial encounter: Secondary | ICD-10-CM | POA: Diagnosis not present

## 2019-09-20 DIAGNOSIS — M9901 Segmental and somatic dysfunction of cervical region: Secondary | ICD-10-CM | POA: Diagnosis not present

## 2019-09-20 DIAGNOSIS — M9902 Segmental and somatic dysfunction of thoracic region: Secondary | ICD-10-CM | POA: Diagnosis not present

## 2019-09-20 DIAGNOSIS — S233XXA Sprain of ligaments of thoracic spine, initial encounter: Secondary | ICD-10-CM | POA: Diagnosis not present

## 2019-09-20 DIAGNOSIS — M9903 Segmental and somatic dysfunction of lumbar region: Secondary | ICD-10-CM | POA: Diagnosis not present

## 2019-09-27 DIAGNOSIS — S233XXA Sprain of ligaments of thoracic spine, initial encounter: Secondary | ICD-10-CM | POA: Diagnosis not present

## 2019-09-27 DIAGNOSIS — S338XXA Sprain of other parts of lumbar spine and pelvis, initial encounter: Secondary | ICD-10-CM | POA: Diagnosis not present

## 2019-09-27 DIAGNOSIS — S134XXA Sprain of ligaments of cervical spine, initial encounter: Secondary | ICD-10-CM | POA: Diagnosis not present

## 2019-09-27 DIAGNOSIS — M9901 Segmental and somatic dysfunction of cervical region: Secondary | ICD-10-CM | POA: Diagnosis not present

## 2019-09-27 DIAGNOSIS — M9902 Segmental and somatic dysfunction of thoracic region: Secondary | ICD-10-CM | POA: Diagnosis not present

## 2019-09-27 DIAGNOSIS — M9903 Segmental and somatic dysfunction of lumbar region: Secondary | ICD-10-CM | POA: Diagnosis not present

## 2019-10-12 ENCOUNTER — Telehealth (INDEPENDENT_AMBULATORY_CARE_PROVIDER_SITE_OTHER): Payer: Self-pay

## 2019-10-12 ENCOUNTER — Other Ambulatory Visit (INDEPENDENT_AMBULATORY_CARE_PROVIDER_SITE_OTHER): Payer: Self-pay | Admitting: Internal Medicine

## 2019-10-12 DIAGNOSIS — N814 Uterovaginal prolapse, unspecified: Secondary | ICD-10-CM

## 2019-10-12 NOTE — Telephone Encounter (Signed)
I have signed the order.  Thanks.

## 2019-10-12 NOTE — Telephone Encounter (Signed)
As per Dr. Jeffie Pollock request from urology, I will refer the patient to urology in [place].

## 2019-10-13 DIAGNOSIS — S338XXA Sprain of other parts of lumbar spine and pelvis, initial encounter: Secondary | ICD-10-CM | POA: Diagnosis not present

## 2019-10-13 DIAGNOSIS — M9902 Segmental and somatic dysfunction of thoracic region: Secondary | ICD-10-CM | POA: Diagnosis not present

## 2019-10-13 DIAGNOSIS — M9901 Segmental and somatic dysfunction of cervical region: Secondary | ICD-10-CM | POA: Diagnosis not present

## 2019-10-13 DIAGNOSIS — S233XXA Sprain of ligaments of thoracic spine, initial encounter: Secondary | ICD-10-CM | POA: Diagnosis not present

## 2019-10-13 DIAGNOSIS — S134XXA Sprain of ligaments of cervical spine, initial encounter: Secondary | ICD-10-CM | POA: Diagnosis not present

## 2019-10-13 DIAGNOSIS — M9903 Segmental and somatic dysfunction of lumbar region: Secondary | ICD-10-CM | POA: Diagnosis not present

## 2019-10-18 DIAGNOSIS — S233XXA Sprain of ligaments of thoracic spine, initial encounter: Secondary | ICD-10-CM | POA: Diagnosis not present

## 2019-10-18 DIAGNOSIS — M9903 Segmental and somatic dysfunction of lumbar region: Secondary | ICD-10-CM | POA: Diagnosis not present

## 2019-10-18 DIAGNOSIS — S338XXA Sprain of other parts of lumbar spine and pelvis, initial encounter: Secondary | ICD-10-CM | POA: Diagnosis not present

## 2019-10-18 DIAGNOSIS — M9902 Segmental and somatic dysfunction of thoracic region: Secondary | ICD-10-CM | POA: Diagnosis not present

## 2019-10-18 DIAGNOSIS — M9901 Segmental and somatic dysfunction of cervical region: Secondary | ICD-10-CM | POA: Diagnosis not present

## 2019-10-18 DIAGNOSIS — S134XXA Sprain of ligaments of cervical spine, initial encounter: Secondary | ICD-10-CM | POA: Diagnosis not present

## 2019-10-19 DIAGNOSIS — M2550 Pain in unspecified joint: Secondary | ICD-10-CM | POA: Diagnosis not present

## 2019-10-19 DIAGNOSIS — E669 Obesity, unspecified: Secondary | ICD-10-CM | POA: Diagnosis not present

## 2019-10-19 DIAGNOSIS — R5382 Chronic fatigue, unspecified: Secondary | ICD-10-CM | POA: Diagnosis not present

## 2019-10-19 DIAGNOSIS — Z6832 Body mass index (BMI) 32.0-32.9, adult: Secondary | ICD-10-CM | POA: Diagnosis not present

## 2019-10-19 DIAGNOSIS — M0579 Rheumatoid arthritis with rheumatoid factor of multiple sites without organ or systems involvement: Secondary | ICD-10-CM | POA: Diagnosis not present

## 2019-10-24 ENCOUNTER — Other Ambulatory Visit: Payer: Self-pay

## 2019-10-24 ENCOUNTER — Ambulatory Visit (INDEPENDENT_AMBULATORY_CARE_PROVIDER_SITE_OTHER): Payer: Medicare Other | Admitting: Internal Medicine

## 2019-10-24 ENCOUNTER — Encounter (INDEPENDENT_AMBULATORY_CARE_PROVIDER_SITE_OTHER): Payer: Self-pay | Admitting: Internal Medicine

## 2019-10-24 VITALS — BP 130/70 | HR 76 | Temp 97.6°F | Resp 18 | Ht 60.0 in | Wt 178.4 lb

## 2019-10-24 DIAGNOSIS — N814 Uterovaginal prolapse, unspecified: Secondary | ICD-10-CM

## 2019-10-24 DIAGNOSIS — N950 Postmenopausal bleeding: Secondary | ICD-10-CM

## 2019-10-24 DIAGNOSIS — E782 Mixed hyperlipidemia: Secondary | ICD-10-CM | POA: Diagnosis not present

## 2019-10-24 DIAGNOSIS — E119 Type 2 diabetes mellitus without complications: Secondary | ICD-10-CM

## 2019-10-24 NOTE — Progress Notes (Signed)
Metrics: Intervention Frequency ACO  Documented Smoking Status Yearly  Screened one or more times in 24 months  Cessation Counseling or  Active cessation medication Past 24 months  Past 24 months   Guideline developer: UpToDate (See UpToDate for funding source) Date Released: 2014       Wellness Office Visit  Subjective:  Patient ID: Jessica Russell, female    DOB: 29-Dec-1927  Age: 86 y.o. MRN: [id number]  CC: This lady comes in for follow-up of diabetes, hyperlipidemia and recent postmenopausal bleeding. HPI  She has an appointment to see urologist because of urinary incontinence, likely resulting from prolapse uterus. She continues with Metformin for her diabetes.  Her last hemoglobin A1c was in a very good range.  She tries to eat as healthy as she can although she likes her chocolates. She did go and see an eye doctor and there was no evidence of diabetic retinopathy. Past Medical History:  Diagnosis Date  . Diabetic nephropathy ([place])   . Foot ulcer, right, with fat layer exposed ([place])    non-healing  . History of bilateral breast cancer 04-14-2018 per pt no recurrence   dx 2000 w/ right breast cancer, s/p  lumpectomy with node dissection completed radiation/  dx 2002 w/ left breast cancer, s/p lumpectomy with node dissection,  completed radiation  . History of diverticulitis of colon 2009   s/p  partial colectomy for perforated diverticulitis  . History of external beam radiation therapy    2000 for right breast cancer;   2002  for left breast cancer  (per pt 36 treatment for each breast)  . HLD (hyperlipidemia) 03/24/2017  . Neuropathy   . OA (osteoarthritis)   . Osteoporosis 04/14/2017  . Peripheral arterial occlusive disease ([place])    04-13-2018   s/p  balloon angioplasty to the anterior tibial artery for severe stenosis right lower extremity (dr Fletcher Anon)  . Rheumatoid arthritis ([place]) 03/13/2019  . Type 2 diabetes mellitus ([place])   . Vitamin D deficiency disease 03/13/2019     Past Surgical History:  Procedure Laterality Date  . ABDOMINAL AORTOGRAM W/LOWER EXTREMITY N/A 04/13/2018   Procedure: ABDOMINAL AORTOGRAM W/LOWER EXTREMITY;  Surgeon: [name], MD;  Location: [facility];  Service: Cardiovascular;  Laterality: N/A;  . APPENDECTOMY  age 21  . BREAST LUMPECTOMY WITH AXILLARY LYMPH NODE DISSECTION Bilateral right 2000;   left 2002  . BUNIONECTOMY Bilateral 1968  . CARPAL TUNNEL RELEASE Right 2017  . CATARACT EXTRACTION W/PHACO Right 08/30/2017   Procedure: CATARACT EXTRACTION PHACO AND INTRAOCULAR LENS PLACEMENT RIGHT EYE;  Surgeon: [name], MD;  [facility]: [facility];  Service: Ophthalmology;  Laterality: Right;  CDE: 13.63  . CATARACT EXTRACTION W/PHACO Left 09/20/2017   Procedure: CATARACT EXTRACTION PHACO AND INTRAOCULAR LENS PLACEMENT LEFT EYE;  Surgeon: [name], MD;  Location: [facility];  Service: Ophthalmology;  Laterality: Left;  CDE: 11.25  . D & C HYSTEROSCOPY W/ RESECTION POLYP  2017   per pt benign  . FOOT SURGERY     removal of two bones  . FRACTURE SURGERY Left 05/12/2016   patella fracture,   per pt no retatined hardware  . GRAFT APPLICATION N/A 56/07/8754   Procedure: APPLICATION OF STRAVIX GRAFT;  Surgeon: [name], DPM;  Location: [facility];  Service: Podiatry;  Laterality: N/A;  . HEMICOLECTOMY  2009   diverticulitis w/ perforation  . METATARSAL HEAD EXCISION N/A 04/18/2018   Procedure: FIRST METATARSAL HEAD RECESSION, WOUND DEBRIEDMENT;  Surgeon: Fritzi Mandes,  Jana Half, DPM;  [facility];  Service: Podiatry;  Laterality: N/A;  . NASAL SEPTOPLASTY W/ TURBINOPLASTY Bilateral 01/04/2018   Procedure: NASAL SEPTOPLASTY WITH BILATERAL TURBINATE REDUCTION;  Surgeon: [name], MD;  Location: [facility];  Service: ENT;  Laterality: Bilateral;  . PERIPHERAL VASCULAR BALLOON ANGIOPLASTY  04/13/2018   Procedure: PERIPHERAL VASCULAR BALLOON ANGIOPLASTY;  Surgeon: [name],  MD;  Location: [facility];  Service: Cardiovascular;;  Anterior Tibial  . SKIN GRAFT     left foot   . VENTRAL HERNIA REPAIR  01-07-2016    [name], [place]   w/ mesh     Family History  Problem Relation Age of Onset  . Diabetes Mother   . Heart disease Mother   . Arthritis Mother   . Hypertension Mother   . Miscarriages / [place] Mother   . Early death Father   . Stroke Father   . Alcohol abuse Father   . Diabetes Sister   . Stroke Sister   . Diabetes Brother   . Diverticulitis Son   . Diabetes Sister   . Stroke Sister     Social History   Social History Narrative   Lives with husband [name]   One son [name] lives in [place]    Father was a [place] from [place], patient is bilingual   Married for over 66 years.    Caffeine use: 1 cup per day   Right handed   Social History   Tobacco Use  . Smoking status: Former Smoker    Packs/day: 1.00    Years: 12.00    Pack years: 12.00    Types: Cigarettes    Start date: 05/11/1950    Quit date: 05/12/1963    Years since quitting: 56.4  . Smokeless tobacco: Never Used  Substance Use Topics  . Alcohol use: No    Current Meds  Medication Sig  . aspirin EC 81 MG tablet Take 1 tablet (81 mg total) by mouth daily.  . fluocinonide gel (LIDEX) 9.67 % Apply 1 application topically as needed (mouth sores).   . folic acid (FOLVITE) 1 MG tablet Take 1 mg by mouth daily.  [name] gabapentin (NEURONTIN) 400 MG capsule TAKE 2 CAPSULES BY MOUTH 4 TIMES A DAY  . lidocaine (LMX) 4 % cream Apply 1 application topically as needed (pain).  . metFORMIN (GLUCOPHAGE) 500 MG tablet TAKE 1 TABLET BY MOUTH EVERY DAY  . methotrexate (RHEUMATREX) 2.5 MG tablet Take 15 mg by mouth once a week. Caution:Chemotherapy. Protect from light.  . Multiple Vitamins-Minerals (CENTRUM SILVER PO) Take by mouth daily.  . Polyvinyl Alcohol-Povidone (REFRESH OP) Apply to eye 2 (two) times daily as needed.  . vitamin B-12 (CYANOCOBALAMIN) 500 MCG tablet Take  500 mcg by mouth daily.  . Vitamin D, Ergocalciferol, (DRISDOL) 1.25 MG (50000 UT) CAPS capsule Take 100,000 Units by mouth daily.       Depression screen [facility] 2/9 07/26/2019 06/27/2019 06/27/2019 11/05/2017 10/12/2017  Decreased Interest 0 0 0 0 0  Down, Depressed, Hopeless 0 1 0 0 0  PHQ - 2 Score 0 1 0 0 0     Objective:   Today's Vitals: BP 130/70 (BP Location: Left Arm, Patient Position: Sitting, Cuff Size: Small)   Pulse 76   Temp 97.6 F (36.4 C) (Temporal)   Resp 18   Ht 5' (1.524 m)   Wt 178 lb 6.4 oz (80.9 kg)   SpO2 98%   BMI 34.84  kg/m  Vitals with BMI 10/24/2019 09/19/2019 07/26/2019  Height 5\' 0"  5' 2.5" 5\' 2"   Weight 178 lbs 6 oz 179 lbs 183 lbs  BMI 34.84 20.0 37.94  Systolic 446 190 122  Diastolic 70 72 68  Pulse 76 68 72     Physical Exam   She looks systemically well.  She has lost 5 pounds since the last time she was seen in this practice.  Blood pressure is excellent.  Alert and orientated without any obvious focal neurological signs.    Assessment   1. Diabetes mellitus type II, non insulin dependent ([place])   2. Prolapsed uterus   3. PMB (postmenopausal bleeding)   4. Mixed hyperlipidemia       Tests ordered No orders of the defined types were placed in this encounter.    Plan: 1. She will continue with Metformin and her diabetes appears to be very stable. 2. As far as the postmenopausal bleeding and prolapse uterus extended, she will be seeing urology soon. 3. She does have a history of hyperlipidemia but does not require statin therapy. 4. Follow-up in 3 months.   No orders of the defined types were placed in this encounter.   Doree Albee, MD

## 2019-10-26 ENCOUNTER — Ambulatory Visit (INDEPENDENT_AMBULATORY_CARE_PROVIDER_SITE_OTHER): Payer: Medicare Other | Admitting: Internal Medicine

## 2019-11-17 ENCOUNTER — Ambulatory Visit: Payer: Medicare Other | Admitting: Urology

## 2019-11-22 ENCOUNTER — Other Ambulatory Visit (INDEPENDENT_AMBULATORY_CARE_PROVIDER_SITE_OTHER): Payer: Self-pay | Admitting: Internal Medicine

## 2019-12-01 ENCOUNTER — Other Ambulatory Visit (INDEPENDENT_AMBULATORY_CARE_PROVIDER_SITE_OTHER): Payer: Self-pay | Admitting: Internal Medicine

## 2019-12-01 DIAGNOSIS — E1142 Type 2 diabetes mellitus with diabetic polyneuropathy: Secondary | ICD-10-CM

## 2019-12-06 DIAGNOSIS — N8111 Cystocele, midline: Secondary | ICD-10-CM | POA: Diagnosis not present

## 2019-12-06 DIAGNOSIS — N3946 Mixed incontinence: Secondary | ICD-10-CM | POA: Diagnosis not present

## 2019-12-06 DIAGNOSIS — R351 Nocturia: Secondary | ICD-10-CM | POA: Diagnosis not present

## 2019-12-06 DIAGNOSIS — R350 Frequency of micturition: Secondary | ICD-10-CM | POA: Diagnosis not present

## 2019-12-25 DIAGNOSIS — E1151 Type 2 diabetes mellitus with diabetic peripheral angiopathy without gangrene: Secondary | ICD-10-CM | POA: Diagnosis not present

## 2019-12-25 DIAGNOSIS — L84 Corns and callosities: Secondary | ICD-10-CM | POA: Diagnosis not present

## 2019-12-25 DIAGNOSIS — L602 Onychogryphosis: Secondary | ICD-10-CM | POA: Diagnosis not present

## 2020-01-23 ENCOUNTER — Other Ambulatory Visit: Payer: Self-pay | Admitting: Otolaryngology

## 2020-01-23 ENCOUNTER — Other Ambulatory Visit (HOSPITAL_COMMUNITY): Payer: Self-pay | Admitting: Otolaryngology

## 2020-01-23 DIAGNOSIS — E1140 Type 2 diabetes mellitus with diabetic neuropathy, unspecified: Secondary | ICD-10-CM | POA: Diagnosis not present

## 2020-01-23 DIAGNOSIS — D333 Benign neoplasm of cranial nerves: Secondary | ICD-10-CM

## 2020-01-23 DIAGNOSIS — M79671 Pain in right foot: Secondary | ICD-10-CM | POA: Diagnosis not present

## 2020-01-23 DIAGNOSIS — M79672 Pain in left foot: Secondary | ICD-10-CM | POA: Diagnosis not present

## 2020-01-24 ENCOUNTER — Other Ambulatory Visit (HOSPITAL_COMMUNITY): Payer: Self-pay | Admitting: Cardiovascular Disease

## 2020-01-24 ENCOUNTER — Other Ambulatory Visit: Payer: Self-pay

## 2020-01-24 ENCOUNTER — Ambulatory Visit (HOSPITAL_COMMUNITY)
Admission: RE | Admit: 2020-01-24 | Discharge: 2020-01-24 | Disposition: A | Discharge status: Home / Self Care | Payer: Medicare Other | Source: Ambulatory Visit | Attending: Cardiology | Admitting: Cardiology

## 2020-01-24 DIAGNOSIS — Z9862 Peripheral vascular angioplasty status: Secondary | ICD-10-CM | POA: Insufficient documentation

## 2020-01-24 DIAGNOSIS — I739 Peripheral vascular disease, unspecified: Secondary | ICD-10-CM

## 2020-01-24 DIAGNOSIS — E669 Obesity, unspecified: Secondary | ICD-10-CM | POA: Diagnosis not present

## 2020-01-24 DIAGNOSIS — M0579 Rheumatoid arthritis with rheumatoid factor of multiple sites without organ or systems involvement: Secondary | ICD-10-CM | POA: Diagnosis not present

## 2020-01-24 DIAGNOSIS — Z6831 Body mass index (BMI) 31.0-31.9, adult: Secondary | ICD-10-CM | POA: Diagnosis not present

## 2020-01-24 DIAGNOSIS — R5382 Chronic fatigue, unspecified: Secondary | ICD-10-CM | POA: Diagnosis not present

## 2020-01-24 DIAGNOSIS — M2550 Pain in unspecified joint: Secondary | ICD-10-CM | POA: Diagnosis not present

## 2020-01-29 ENCOUNTER — Ambulatory Visit (INDEPENDENT_AMBULATORY_CARE_PROVIDER_SITE_OTHER): Payer: Medicare Other | Admitting: Internal Medicine

## 2020-01-29 DIAGNOSIS — H838X3 Other specified diseases of inner ear, bilateral: Secondary | ICD-10-CM | POA: Diagnosis not present

## 2020-01-29 DIAGNOSIS — D333 Benign neoplasm of cranial nerves: Secondary | ICD-10-CM | POA: Diagnosis not present

## 2020-01-29 DIAGNOSIS — H903 Sensorineural hearing loss, bilateral: Secondary | ICD-10-CM | POA: Diagnosis not present

## 2020-01-29 DIAGNOSIS — H6123 Impacted cerumen, bilateral: Secondary | ICD-10-CM | POA: Diagnosis not present

## 2020-01-29 DIAGNOSIS — R1312 Dysphagia, oropharyngeal phase: Secondary | ICD-10-CM | POA: Diagnosis not present

## 2020-01-30 ENCOUNTER — Other Ambulatory Visit (HOSPITAL_COMMUNITY): Payer: Self-pay | Admitting: Otolaryngology

## 2020-01-30 ENCOUNTER — Other Ambulatory Visit: Payer: Self-pay | Admitting: Otolaryngology

## 2020-01-30 DIAGNOSIS — R1312 Dysphagia, oropharyngeal phase: Secondary | ICD-10-CM

## 2020-02-01 ENCOUNTER — Other Ambulatory Visit: Payer: Self-pay

## 2020-02-01 ENCOUNTER — Ambulatory Visit (HOSPITAL_COMMUNITY)
Admission: RE | Admit: 2020-02-01 | Discharge: 2020-02-01 | Disposition: A | Discharge status: Home / Self Care | Payer: Medicare Other | Source: Ambulatory Visit | Attending: Otolaryngology | Admitting: Otolaryngology

## 2020-02-01 DIAGNOSIS — R1312 Dysphagia, oropharyngeal phase: Secondary | ICD-10-CM

## 2020-02-01 DIAGNOSIS — K224 Dyskinesia of esophagus: Secondary | ICD-10-CM | POA: Diagnosis not present

## 2020-02-05 ENCOUNTER — Ambulatory Visit (INDEPENDENT_AMBULATORY_CARE_PROVIDER_SITE_OTHER): Payer: Medicare Other | Admitting: Nurse Practitioner

## 2020-02-06 ENCOUNTER — Ambulatory Visit (INDEPENDENT_AMBULATORY_CARE_PROVIDER_SITE_OTHER): Payer: Medicare Other | Admitting: Nurse Practitioner

## 2020-02-06 ENCOUNTER — Other Ambulatory Visit: Payer: Self-pay

## 2020-02-06 ENCOUNTER — Encounter (INDEPENDENT_AMBULATORY_CARE_PROVIDER_SITE_OTHER): Payer: Self-pay | Admitting: Nurse Practitioner

## 2020-02-06 VITALS — BP 122/64 | HR 72 | Temp 97.5°F | Resp 12 | Ht 62.0 in | Wt 175.4 lb

## 2020-02-06 DIAGNOSIS — E782 Mixed hyperlipidemia: Secondary | ICD-10-CM | POA: Diagnosis not present

## 2020-02-06 DIAGNOSIS — E559 Vitamin D deficiency, unspecified: Secondary | ICD-10-CM

## 2020-02-06 DIAGNOSIS — E119 Type 2 diabetes mellitus without complications: Secondary | ICD-10-CM

## 2020-02-06 DIAGNOSIS — R1310 Dysphagia, unspecified: Secondary | ICD-10-CM

## 2020-02-06 NOTE — Progress Notes (Signed)
Subjective:  Patient ID: Jessica Russell, female    DOB: 1928-04-07  Age: 86 y.o. MRN: [id number]  CC:  Chief Complaint  Patient presents with  . Diabetes  . Other    Dysphagia, hyperlipidemia  . Hyperlipidemia      HPI  This patient arrives today for the above.  She has a history of type 2 diabetes, last A1c was 6.5.  She continues on Metformin.  She tells me she is been experiencing some dysphagia, she tells me she underwent barium swallow testing when evaluated by ENT.  They told her that there is less related to age-related changes.  This is quite bothersome for her.  She does have a history of hyperlipidemia she is due for lipid panel check.  She continues taking vitamin D3 supplement for history of vitamin D deficiency.  She is due for serum check today.  Of note, she does follow with rheumatologist for treatment of her rheumatoid arthritis.  She tells me she is also scheduled to see a urology-OB/GYN specialist to evaluate her urinary incontinence and vaginal bleeding.  Past Medical History:  Diagnosis Date  . Diabetic nephropathy ([place])   . Foot ulcer, right, with fat layer exposed ([place])    non-healing  . History of bilateral breast cancer 04-14-2018 per pt no recurrence   dx 2000 w/ right breast cancer, s/p  lumpectomy with node dissection completed radiation/  dx 2002 w/ left breast cancer, s/p lumpectomy with node dissection,  completed radiation  . History of diverticulitis of colon 2009   s/p  partial colectomy for perforated diverticulitis  . History of external beam radiation therapy    2000 for right breast cancer;   2002  for left breast cancer  (per pt 36 treatment for each breast)  . HLD (hyperlipidemia) 03/24/2017  . Neuropathy   . OA (osteoarthritis)   . Osteoporosis 04/14/2017  . Peripheral arterial occlusive disease ([place])    04-13-2018   s/p  balloon angioplasty to the anterior tibial artery for severe stenosis right lower extremity (dr Fletcher Anon)   . Rheumatoid arthritis ([place]) 03/13/2019  . Type 2 diabetes mellitus ([place])   . Vitamin D deficiency disease 03/13/2019      Family History  Problem Relation Age of Onset  . Diabetes Mother   . Heart disease Mother   . Arthritis Mother   . Hypertension Mother   . Miscarriages / [place] Mother   . Early death Father   . Stroke Father   . Alcohol abuse Father   . Diabetes Sister   . Stroke Sister   . Diabetes Brother   . Diverticulitis Son   . Diabetes Sister   . Stroke Sister     Social History   Social History Narrative   Lives with husband [name]   One son [name] lives in [place]    Father was a [place] from [place], patient is bilingual   Married for over 35 years.    Caffeine use: 1 cup per day   Right handed   Social History   Tobacco Use  . Smoking status: Former Smoker    Packs/day: 1.00    Years: 12.00    Pack years: 12.00    Types: Cigarettes    Start date: 05/11/1950    Quit date: 05/12/1963    Years since quitting: 56.7  . Smokeless tobacco: Never Used  Substance Use Topics  . Alcohol use: No     Current Meds  Medication Sig  .  aspirin EC 81 MG tablet Take 1 tablet (81 mg total) by mouth daily.  . Cholecalciferol 1.25 MG (50000 UT) TABS Take 2 tablets by mouth.  . fluocinonide gel (LIDEX) 0.56 % Apply 1 application topically as needed (mouth sores).   . folic acid (FOLVITE) 1 MG tablet Take 1 mg by mouth daily.  [name] gabapentin (NEURONTIN) 400 MG capsule TAKE 2 CAPSULES BY MOUTH 4 TIMES A DAY  . lidocaine (LMX) 4 % cream Apply 1 application topically as needed (pain).  . metFORMIN (GLUCOPHAGE) 500 MG tablet TAKE 1 TABLET BY MOUTH EVERY DAY  . methotrexate (RHEUMATREX) 2.5 MG tablet Take 15 mg by mouth once a week. Caution:Chemotherapy. Protect from light.  . Multiple Vitamins-Minerals (CENTRUM SILVER PO) Take by mouth daily.  . Polyvinyl Alcohol-Povidone (REFRESH OP) Apply to eye 2 (two) times daily as needed.  . Turmeric 1053 MG TABS Take 600 mg  by mouth. With black pepper extract  . vitamin B-12 (CYANOCOBALAMIN) 500 MCG tablet Take 500 mcg by mouth daily.    ROS:  ROS   Objective:   Today's Vitals: BP 122/64   Pulse 72   Temp (!) 97.5 F (36.4 C)   Resp 12   Ht 5' 2" (1.575 m)   Wt 175 lb 6.4 oz (79.6 kg)   SpO2 97%   BMI 32.08 kg/m  Vitals with BMI 02/06/2020 10/24/2019 09/19/2019  Height 5' 2" 5' 0" 5' 2.5"  Weight 175 lbs 6 oz 178 lbs 6 oz 179 lbs  BMI 32.07 97.94 80.1  Systolic 655 374 827  Diastolic 64 70 72  Pulse 72 76 68     Physical Exam Vitals reviewed.  Constitutional:      General: She is not in acute distress.    Appearance: Normal appearance.  HENT:     Head: Normocephalic and atraumatic.  Neck:     Vascular: No carotid bruit.  Cardiovascular:     Rate and Rhythm: Normal rate and regular rhythm.     Pulses: Normal pulses.     Heart sounds: Normal heart sounds.  Pulmonary:     Effort: Pulmonary effort is normal.     Breath sounds: Normal breath sounds.  Skin:    General: Skin is warm and dry.  Neurological:     General: No focal deficit present.     Mental Status: She is alert and oriented to person, place, and time.  Psychiatric:        Mood and Affect: Mood normal.        Behavior: Behavior normal.        Judgment: Judgment normal.          Assessment and Plan   1. Mixed hyperlipidemia   2. Vitamin D deficiency disease   3. Diabetes mellitus type II, non insulin dependent ([place])   4. Dysphagia, unspecified type      Plan: 1.  We will check lipid panel today for further evaluation. 2.  We will check serum vitamin D level today for further evaluation. 3.  We will check A1c today.  Last microalbuminuria level was checked earlier this year was negative.  She is encouraged to follow-up with podiatry as scheduled. 4.  We will refer to speech therapy to see if there is anything we can do to help her with managing her dysphagia.   She was encouraged to follow-up with  rheumatology and OB/GYN-urology specialist as scheduled.  Tests ordered Orders Placed This Encounter  Procedures  . Lipid Panel  .  CMP with eGFR([facility])  . Vitamin D, 25-hydroxy  . Hemoglobin A1c  . Ambulatory referral to Speech Therapy      No orders of the defined types were placed in this encounter.   Patient to follow-up in 1 month or sooner as needed.  Ailene Ards, NP

## 2020-02-07 ENCOUNTER — Ambulatory Visit (HOSPITAL_COMMUNITY): Payer: Medicare Other

## 2020-02-07 ENCOUNTER — Ambulatory Visit (INDEPENDENT_AMBULATORY_CARE_PROVIDER_SITE_OTHER): Payer: Medicare Other

## 2020-02-07 DIAGNOSIS — E119 Type 2 diabetes mellitus without complications: Secondary | ICD-10-CM | POA: Diagnosis not present

## 2020-02-07 DIAGNOSIS — E559 Vitamin D deficiency, unspecified: Secondary | ICD-10-CM | POA: Diagnosis not present

## 2020-02-07 DIAGNOSIS — Z23 Encounter for immunization: Secondary | ICD-10-CM

## 2020-02-07 DIAGNOSIS — E782 Mixed hyperlipidemia: Secondary | ICD-10-CM | POA: Diagnosis not present

## 2020-02-07 NOTE — Progress Notes (Signed)
Pt given flu high dose to left arm. Pt tolerated well. No complaints.

## 2020-02-08 LAB — LIPID PANEL
Cholesterol: 197 mg/dL (ref <200)
HDL: 40 mg/dL — ABNORMAL LOW (ref >=50)
Non-HDL Cholesterol (Calc): 157 mg/dL (calc) — ABNORMAL HIGH (ref <130)
Total CHOL/HDL Ratio: 4.9 (calc) (ref <5.0)
Triglycerides: 508 mg/dL — ABNORMAL HIGH (ref <150)

## 2020-02-08 LAB — COMPLETE METABOLIC PANEL WITH GFR
AG Ratio: 1.8 (calc) (ref 1.0–2.5)
ALT: 17 U/L (ref 6–29)
AST: 23 U/L (ref 10–35)
Albumin: 4.3 g/dL (ref 3.6–5.1)
Alkaline phosphatase (APISO): 65 U/L (ref 37–153)
BUN: 19 mg/dL (ref 7–25)
CO2: 26 mmol/L (ref 20–32)
Calcium: 10.1 mg/dL (ref 8.6–10.4)
Chloride: 102 mmol/L (ref 98–110)
Creat: 0.72 mg/dL (ref 0.60–0.88)
GFR, Est African American: 85 mL/min/{1.73_m2} (ref >=60)
GFR, Est Non African American: 74 mL/min/{1.73_m2} (ref >=60)
Globulin: 2.4 g/dL (calc) (ref 1.9–3.7)
Glucose, Bld: 80 mg/dL (ref 65–99)
Potassium: 4.5 mmol/L (ref 3.5–5.3)
Sodium: 139 mmol/L (ref 135–146)
Total Bilirubin: 0.3 mg/dL (ref 0.2–1.2)
Total Protein: 6.7 g/dL (ref 6.1–8.1)

## 2020-02-08 LAB — HEMOGLOBIN A1C
Hgb A1c MFr Bld: 6.4 % of total Hgb — ABNORMAL HIGH (ref <5.7)
Mean Plasma Glucose: 137 (calc)
eAG (mmol/L): 7.6 (calc)

## 2020-02-08 LAB — VITAMIN D 25 HYDROXY (VIT D DEFICIENCY, FRACTURES): Vit D, 25-Hydroxy: 89 ng/mL (ref 30–100)

## 2020-02-12 ENCOUNTER — Ambulatory Visit (HOSPITAL_COMMUNITY)
Admission: RE | Admit: 2020-02-12 | Discharge: 2020-02-12 | Disposition: A | Discharge status: Home / Self Care | Payer: Medicare Other | Source: Ambulatory Visit | Attending: Otolaryngology | Admitting: Otolaryngology

## 2020-02-12 ENCOUNTER — Other Ambulatory Visit: Payer: Self-pay

## 2020-02-12 DIAGNOSIS — D333 Benign neoplasm of cranial nerves: Secondary | ICD-10-CM | POA: Diagnosis not present

## 2020-02-12 MED ORDER — GADOBUTROL 1 MMOL/ML IV SOLN
7.0000 mL | Freq: Once | INTRAVENOUS | Status: AC | PRN
  Administered 2020-02-12: 7 mL via INTRAVENOUS

## 2020-02-21 DIAGNOSIS — H401131 Primary open-angle glaucoma, bilateral, mild stage: Secondary | ICD-10-CM | POA: Diagnosis not present

## 2020-02-28 DIAGNOSIS — N3941 Urge incontinence: Secondary | ICD-10-CM | POA: Diagnosis not present

## 2020-02-28 DIAGNOSIS — Z4689 Encounter for fitting and adjustment of other specified devices: Secondary | ICD-10-CM | POA: Diagnosis not present

## 2020-02-28 DIAGNOSIS — N813 Complete uterovaginal prolapse: Secondary | ICD-10-CM | POA: Diagnosis not present

## 2020-02-28 DIAGNOSIS — R339 Retention of urine, unspecified: Secondary | ICD-10-CM | POA: Diagnosis not present

## 2020-02-28 DIAGNOSIS — N814 Uterovaginal prolapse, unspecified: Secondary | ICD-10-CM | POA: Diagnosis not present

## 2020-02-28 DIAGNOSIS — G629 Polyneuropathy, unspecified: Secondary | ICD-10-CM | POA: Insufficient documentation

## 2020-02-28 DIAGNOSIS — R82998 Other abnormal findings in urine: Secondary | ICD-10-CM | POA: Diagnosis not present

## 2020-02-28 DIAGNOSIS — E1140 Type 2 diabetes mellitus with diabetic neuropathy, unspecified: Secondary | ICD-10-CM | POA: Diagnosis not present

## 2020-02-28 DIAGNOSIS — B9620 Unspecified Escherichia coli [E. coli] as the cause of diseases classified elsewhere: Secondary | ICD-10-CM | POA: Diagnosis not present

## 2020-02-28 DIAGNOSIS — R350 Frequency of micturition: Secondary | ICD-10-CM | POA: Diagnosis not present

## 2020-03-03 ENCOUNTER — Other Ambulatory Visit (INDEPENDENT_AMBULATORY_CARE_PROVIDER_SITE_OTHER): Payer: Self-pay | Admitting: Internal Medicine

## 2020-03-03 DIAGNOSIS — E1142 Type 2 diabetes mellitus with diabetic polyneuropathy: Secondary | ICD-10-CM

## 2020-03-05 ENCOUNTER — Other Ambulatory Visit: Payer: Self-pay

## 2020-03-05 ENCOUNTER — Ambulatory Visit (INDEPENDENT_AMBULATORY_CARE_PROVIDER_SITE_OTHER): Payer: Medicare Other | Admitting: Internal Medicine

## 2020-03-05 ENCOUNTER — Encounter (INDEPENDENT_AMBULATORY_CARE_PROVIDER_SITE_OTHER): Payer: Self-pay | Admitting: Internal Medicine

## 2020-03-05 VITALS — BP 131/70 | HR 69 | Temp 97.1°F | Resp 18 | Ht 65.0 in | Wt 171.0 lb

## 2020-03-05 DIAGNOSIS — M79605 Pain in left leg: Secondary | ICD-10-CM | POA: Diagnosis not present

## 2020-03-05 DIAGNOSIS — E119 Type 2 diabetes mellitus without complications: Secondary | ICD-10-CM

## 2020-03-05 MED ORDER — PREDNISONE 20 MG PO TABS: 40.0000 mg | ORAL_TABLET | Freq: Every day | ORAL | 1 refills | Status: DC

## 2020-03-05 NOTE — Progress Notes (Signed)
Metrics: Intervention Frequency ACO  Documented Smoking Status Yearly  Screened one or more times in 24 months  Cessation Counseling or  Active cessation medication Past 24 months  Past 24 months   Guideline developer: UpToDate (See UpToDate for funding source) Date Released: 2014       Wellness Office Visit  Subjective:  Patient ID: Jessica Russell, female    DOB: 1927-05-27  Age: 86 y.o. MRN: [id number]  CC: Pain in left lower leg/shin area. HPI  She comes in as an acute visit with the above symptoms for the last 1 month.  She denies any swelling of the left lower leg.  She wonders if there is a varicose vein that has been inflamed. Past Medical History:  Diagnosis Date  . Diabetic nephropathy ([place])   . Foot ulcer, right, with fat layer exposed ([place])    non-healing  . History of bilateral breast cancer 04-14-2018 per pt no recurrence   dx 2000 w/ right breast cancer, s/p  lumpectomy with node dissection completed radiation/  dx 2002 w/ left breast cancer, s/p lumpectomy with node dissection,  completed radiation  . History of diverticulitis of colon 2009   s/p  partial colectomy for perforated diverticulitis  . History of external beam radiation therapy    2000 for right breast cancer;   2002  for left breast cancer  (per pt 36 treatment for each breast)  . HLD (hyperlipidemia) 03/24/2017  . Neuropathy   . OA (osteoarthritis)   . Osteoporosis 04/14/2017  . Peripheral arterial occlusive disease ([place])    04-13-2018   s/p  balloon angioplasty to the anterior tibial artery for severe stenosis right lower extremity (dr Fletcher Anon)  . Rheumatoid arthritis ([place]) 03/13/2019  . Type 2 diabetes mellitus ([place])   . Vitamin D deficiency disease 03/13/2019   Past Surgical History:  Procedure Laterality Date  . ABDOMINAL AORTOGRAM W/LOWER EXTREMITY N/A 04/13/2018   Procedure: ABDOMINAL AORTOGRAM W/LOWER EXTREMITY;  Surgeon: [name], MD;  Location: [facility];  Service:  Cardiovascular;  Laterality: N/A;  . APPENDECTOMY  age 62  . BREAST LUMPECTOMY WITH AXILLARY LYMPH NODE DISSECTION Bilateral right 2000;   left 2002  . BUNIONECTOMY Bilateral 1968  . CARPAL TUNNEL RELEASE Right 2017  . CATARACT EXTRACTION W/PHACO Right 08/30/2017   Procedure: CATARACT EXTRACTION PHACO AND INTRAOCULAR LENS PLACEMENT RIGHT EYE;  Surgeon: [name], MD;  [facility];  Service: Ophthalmology;  Laterality: Right;  CDE: 13.63  . CATARACT EXTRACTION W/PHACO Left 09/20/2017   Procedure: CATARACT EXTRACTION PHACO AND INTRAOCULAR LENS PLACEMENT LEFT EYE;  Surgeon: [name], MD;  Location[facility];  Service: Ophthalmology;  Laterality: Left;  CDE: 11.25  . D & C HYSTEROSCOPY W/ RESECTION POLYP  2017   per pt benign  . FOOT SURGERY     removal of two bones  . FRACTURE SURGERY Left 05/12/2016   patella fracture,   per pt no retatined hardware  . GRAFT APPLICATION N/A 42/07/5359   Procedure: APPLICATION OF STRAVIX GRAFT;  Surgeon: [name], DPM;  Location: [facility];  Service: Podiatry;  Laterality: N/A;  . HEMICOLECTOMY  2009   diverticulitis w/ perforation  . METATARSAL HEAD EXCISION N/A 04/18/2018   Procedure: FIRST METATARSAL HEAD RECESSION, WOUND DEBRIEDMENT;  Surgeon: [name], DPM;  Location: [facility];  Service: Podiatry;  Laterality: N/A;  . NASAL SEPTOPLASTY W/ TURBINOPLASTY Bilateral 01/04/2018   Procedure: NASAL SEPTOPLASTY WITH BILATERAL TURBINATE REDUCTION;  Surgeon: Benjamine Mola,  Su, MD;  Location: [facility];  Service: ENT;  Laterality: Bilateral;  . PERIPHERAL VASCULAR BALLOON ANGIOPLASTY  04/13/2018   Procedure: PERIPHERAL VASCULAR BALLOON ANGIOPLASTY;  Surgeon: [name], MD;  Location: [facility];  Service: Cardiovascular;;  Anterior Tibial  . SKIN GRAFT     left foot   . VENTRAL HERNIA REPAIR  01-07-2016    [name], [place]   w/ mesh     Family History  Problem Relation Age of Onset  .  Diabetes Mother   . Heart disease Mother   . Arthritis Mother   . Hypertension Mother   . Miscarriages / [place] Mother   . Early death Father   . Stroke Father   . Alcohol abuse Father   . Diabetes Sister   . Stroke Sister   . Diabetes Brother   . Diverticulitis Son   . Diabetes Sister   . Stroke Sister     Social History   Social History Narrative   Lives with husband [name]   One son [name] lives in [place]    Father was a [place] from [place], patient is bilingual   Married for over 63 years.    Caffeine use: 1 cup per day   Right handed   Social History   Tobacco Use  . Smoking status: Former Smoker    Packs/day: 1.00    Years: 12.00    Pack years: 12.00    Types: Cigarettes    Start date: 05/11/1950    Quit date: 05/12/1963    Years since quitting: 56.8  . Smokeless tobacco: Never Used  Substance Use Topics  . Alcohol use: No    Current Meds  Medication Sig  . aspirin EC 81 MG tablet Take 1 tablet (81 mg total) by mouth daily.  . Cholecalciferol 1.25 MG (50000 UT) TABS Take 2 tablets by mouth.  . fluocinonide gel (LIDEX) 4.09 % Apply 1 application topically as needed (mouth sores).   . folic acid (FOLVITE) 1 MG tablet Take 1 mg by mouth daily.  [name] gabapentin (NEURONTIN) 400 MG capsule TAKE 2 CAPSULES BY MOUTH 4 TIMES A DAY  . lidocaine (LMX) 4 % cream Apply 1 application topically as needed (pain).  . metFORMIN (GLUCOPHAGE) 500 MG tablet TAKE 1 TABLET BY MOUTH EVERY DAY  . methotrexate (RHEUMATREX) 2.5 MG tablet Take 15 mg by mouth once a week. Caution:Chemotherapy. Protect from light.  . Multiple Vitamins-Minerals (CENTRUM SILVER PO) Take by mouth daily.  . Polyvinyl Alcohol-Povidone (REFRESH OP) Apply to eye 2 (two) times daily as needed.  . Turmeric 1053 MG TABS Take 600 mg by mouth. With black pepper extract  . vitamin B-12 (CYANOCOBALAMIN) 500 MCG tablet Take 500 mcg by mouth daily.      Depression screen [facility] 2/9 07/26/2019 06/27/2019 06/27/2019  11/05/2017 10/12/2017  Decreased Interest 0 0 0 0 0  Down, Depressed, Hopeless 0 1 0 0 0  PHQ - 2 Score 0 1 0 0 0     Objective:   Today's Vitals: BP 131/70 (BP Location: Right Arm, Patient Position: Sitting, Cuff Size: Normal)   Pulse 69   Temp (!) 97.1 F (36.2 C) (Temporal)   Resp 18   Ht 5\' 5"  (1.651 m)   Wt 171 lb (77.6 kg)   SpO2 98%   BMI 28.46 kg/m  Vitals with BMI 03/05/2020 02/06/2020 10/24/2019  Height 5\' 5"  5\' 2"  5\' 0"   Weight 171 lbs 175 lbs 6 oz 178  lbs 6 oz  BMI 28.46 76.15 18.34  Systolic 373 578 978  Diastolic 70 64 70  Pulse 69 72 76     Physical Exam  She does not appear to be in acute pain.  There may be a varicosity in the left calf area but I am not quite sure.  Clinically, there is no evidence of DVT.     Assessment   1. Pain in left leg   2. Diabetes mellitus type II, non insulin dependent ([place])       Tests ordered Orders Placed This Encounter  Procedures  . Ambulatory referral to Vascular Surgery     Plan: 1.  I am going to treat her empirically with a short course of prednisone for 5 days and I have explained possible side effects. 2.  I will refer to vascular surgery for further evaluation.  Meds ordered this encounter  Medications  . predniSONE (DELTASONE) 20 MG tablet    Sig: Take 2 tablets (40 mg total) by mouth daily with breakfast.    Dispense:  10 tablet    Refill:  1    Garrie Woodin Luther Parody, MD

## 2020-03-18 ENCOUNTER — Encounter (INDEPENDENT_AMBULATORY_CARE_PROVIDER_SITE_OTHER): Payer: Self-pay | Admitting: Internal Medicine

## 2020-03-18 ENCOUNTER — Other Ambulatory Visit: Payer: Self-pay

## 2020-03-18 ENCOUNTER — Ambulatory Visit (INDEPENDENT_AMBULATORY_CARE_PROVIDER_SITE_OTHER): Payer: Medicare Other | Admitting: Internal Medicine

## 2020-03-18 VITALS — BP 126/78 | HR 64 | Temp 97.3°F | Resp 18 | Ht 65.0 in | Wt 174.0 lb

## 2020-03-18 DIAGNOSIS — E782 Mixed hyperlipidemia: Secondary | ICD-10-CM

## 2020-03-18 DIAGNOSIS — E119 Type 2 diabetes mellitus without complications: Secondary | ICD-10-CM

## 2020-03-18 DIAGNOSIS — M810 Age-related osteoporosis without current pathological fracture: Secondary | ICD-10-CM

## 2020-03-18 DIAGNOSIS — E559 Vitamin D deficiency, unspecified: Secondary | ICD-10-CM | POA: Diagnosis not present

## 2020-03-18 NOTE — Progress Notes (Signed)
Metrics: Intervention Frequency ACO  Documented Smoking Status Yearly  Screened one or more times in 24 months  Cessation Counseling or  Active cessation medication Past 24 months  Past 24 months   Guideline developer: UpToDate (See UpToDate for funding source) Date Released: 2014       Wellness Office Visit  Subjective:  Patient ID: Jessica Russell, female    DOB: [birth year]  Age: 86 y.o. MRN: [id number]  CC: This lady comes in for follow-up of diabetes, hyperlipidemia, osteoporosis and vitamin D deficiency. HPI  She is doing rather well at 86 years old. Her hemoglobin A1c measured about a couple of months ago was very acceptable at 6.4%. Triglycerides were elevated but I am not convinced this was a fasting level and also she does describe eating sugary foods throughout the week. She continues on Metformin for her diabetes. The left leg pain that she had previously is completely resolved with steroids. She continues on vitamin D 3 supplementation for vitamin D deficiency.  Past Medical History:  Diagnosis Date  . Diabetic nephropathy ([place])   . Foot ulcer, right, with fat layer exposed ([place])    non-healing  . History of bilateral breast cancer 04-14-2018 per pt no recurrence   dx 2000 w/ right breast cancer, s/p  lumpectomy with node dissection completed radiation/  dx 2002 w/ left breast cancer, s/p lumpectomy with node dissection,  completed radiation  . History of diverticulitis of colon 2009   s/p  partial colectomy for perforated diverticulitis  . History of external beam radiation therapy    2000 for right breast cancer;   2002  for left breast cancer  (per pt 36 treatment for each breast)  . HLD (hyperlipidemia) 03/24/2017  . Neuropathy   . OA (osteoarthritis)   . Osteoporosis 04/14/2017  . Peripheral arterial occlusive disease ([place])    04-13-2018   s/p  balloon angioplasty to the anterior tibial artery for severe stenosis right lower extremity (dr Fletcher Anon)  .  Rheumatoid arthritis ([place]) 03/13/2019  . Type 2 diabetes mellitus ([place])   . Vitamin D deficiency disease 03/13/2019   Past Surgical History:  Procedure Laterality Date  . ABDOMINAL AORTOGRAM W/LOWER EXTREMITY N/A 04/13/2018   Procedure: ABDOMINAL AORTOGRAM W/LOWER EXTREMITY;  Surgeon: [name], MD;  Location: [facility];  Service: Cardiovascular;  Laterality: N/A;  . APPENDECTOMY  age 37  . BREAST LUMPECTOMY WITH AXILLARY LYMPH NODE DISSECTION Bilateral right 2000;   left 2002  . BUNIONECTOMY Bilateral 1968  . CARPAL TUNNEL RELEASE Right 2017  . CATARACT EXTRACTION W/PHACO Right 08/30/2017   Procedure: CATARACT EXTRACTION PHACO AND INTRAOCULAR LENS PLACEMENT RIGHT EYE;  Surgeon: [name], MD;  Location: [facility];  Service: Ophthalmology;  Laterality: Right;  CDE: 13.63  . CATARACT EXTRACTION W/PHACO Left 09/20/2017   Procedure: CATARACT EXTRACTION PHACO AND INTRAOCULAR LENS PLACEMENT LEFT EYE;  Surgeon: [name], MD;  Location: [facility];  Service: Ophthalmology;  Laterality: Left;  CDE: 11.25  . D & C HYSTEROSCOPY W/ RESECTION POLYP  2017   per pt benign  . FOOT SURGERY     removal of two bones  . FRACTURE SURGERY Left 05/12/2016   patella fracture,   per pt no retatined hardware  . GRAFT APPLICATION N/A 27/0/3500   Procedure: APPLICATION OF STRAVIX GRAFT;  Surgeon: [name], DPM;  Location: [facility];  Service: Podiatry;  Laterality: N/A;  . HEMICOLECTOMY  2009   diverticulitis w/ perforation  . METATARSAL HEAD  EXCISION N/A 04/18/2018   Procedure: FIRST METATARSAL HEAD RECESSION, WOUND DEBRIEDMENT;  Surgeon: [name], DPM;  Location: [facility];  Service: Podiatry;  Laterality: N/A;  . NASAL SEPTOPLASTY W/ TURBINOPLASTY Bilateral 01/04/2018   Procedure: NASAL SEPTOPLASTY WITH BILATERAL TURBINATE REDUCTION;  Surgeon: [name], MD;  Location: [facility];  Service: ENT;  Laterality: Bilateral;  . PERIPHERAL VASCULAR  BALLOON ANGIOPLASTY  04/13/2018   Procedure: PERIPHERAL VASCULAR BALLOON ANGIOPLASTY;  Surgeon: [name], MD;  Location: [facility];  Service: Cardiovascular;;  Anterior Tibial  . SKIN GRAFT     left foot   . VENTRAL HERNIA REPAIR  01-07-2016    [name], [place]   w/ mesh     Family History  Problem Relation Age of Onset  . Diabetes Mother   . Heart disease Mother   . Arthritis Mother   . Hypertension Mother   . Miscarriages / [place] Mother   . Early death Father   . Stroke Father   . Alcohol abuse Father   . Diabetes Sister   . Stroke Sister   . Diabetes Brother   . Diverticulitis Son   . Diabetes Sister   . Stroke Sister     Social History   Social History Narrative   Lives with husband Jessica Russell   One son Jessica Russell lives in [place]    Father was a [place] from [place], patient is bilingual   Married for over 24 years.    Caffeine use: 1 cup per day   Right handed   Social History   Tobacco Use  . Smoking status: Former Smoker    Packs/day: 1.00    Years: 12.00    Pack years: 12.00    Types: Cigarettes    Start date: 05/11/1950    Quit date: 05/12/1963    Years since quitting: 56.8  . Smokeless tobacco: Never Used  Substance Use Topics  . Alcohol use: No    Current Meds  Medication Sig  . aspirin EC 81 MG tablet Take 1 tablet (81 mg total) by mouth daily.  . Cholecalciferol 1.25 MG (50000 UT) TABS Take 2 tablets by mouth.  . fluocinonide gel (LIDEX) 7.16 % Apply 1 application topically as needed (mouth sores).   . folic acid (FOLVITE) 1 MG tablet Take 1 mg by mouth daily.  [name] gabapentin (NEURONTIN) 400 MG capsule TAKE 2 CAPSULES BY MOUTH 4 TIMES A DAY  . lidocaine (LMX) 4 % cream Apply 1 application topically as needed (pain).  . metFORMIN (GLUCOPHAGE) 500 MG tablet TAKE 1 TABLET BY MOUTH EVERY DAY  . methotrexate (RHEUMATREX) 2.5 MG tablet Take 15 mg by mouth once a week. Caution:Chemotherapy. Protect from light.  . Multiple Vitamins-Minerals  (CENTRUM SILVER PO) Take by mouth daily.  . Polyvinyl Alcohol-Povidone (REFRESH OP) Apply to eye 2 (two) times daily as needed.  . Turmeric 1053 MG TABS Take 600 mg by mouth. With black pepper extract  . vitamin B-12 (CYANOCOBALAMIN) 500 MCG tablet Take 500 mcg by mouth daily.      Depression screen [facility] 2/9 07/26/2019 06/27/2019 06/27/2019 11/05/2017 10/12/2017  Decreased Interest 0 0 0 0 0  Down, Depressed, Hopeless 0 1 0 0 0  PHQ - 2 Score 0 1 0 0 0     Objective:   Today's Vitals: BP 126/78 (BP Location: Left Arm, Patient Position: Sitting, Cuff Size: Normal)   Pulse 64   Temp (!) 97.3 F (36.3 C) (Temporal)  Resp 18   Ht 5\' 5"  (1.651 m)   Wt 174 lb (78.9 kg)   SpO2 97%   BMI 28.96 kg/m  Vitals with BMI 03/18/2020 03/05/2020 02/06/2020  Height 5\' 5"  5\' 5"  5\' 2"   Weight 174 lbs 171 lbs 175 lbs 6 oz  BMI 28.96 40.37 54.36  Systolic 067 703 403  Diastolic 78 70 64  Pulse 64 69 72     Physical Exam  She looks systemically well.  Weight is stable.  Blood pressure is excellent for age.  No other new physical signs.  I note that she is able to stand up from a sitting position unaided and without using her arms.     Assessment   1. Diabetes mellitus type II, non insulin dependent ([place])   2. Mixed hyperlipidemia   3. Age-related osteoporosis without current pathological fracture   4. Vitamin D deficiency disease       Tests ordered No orders of the defined types were placed in this encounter.    Plan: 1. Continue with Metformin for diabetes. 2. Continue to moderate with diet and at her age I do not think we need to be extremely restrictive. 3. Continue with weightbearing exercises and I showed her squats that she can do with the use of support at home. 4. Continue with vitamin D3 supplementation as before. 5. Follow-up with Sarah scheduled in March.   No orders of the defined types were placed in this encounter.   Doree Albee, MD

## 2020-03-21 ENCOUNTER — Encounter (INDEPENDENT_AMBULATORY_CARE_PROVIDER_SITE_OTHER): Payer: Self-pay | Admitting: Nurse Practitioner

## 2020-03-21 ENCOUNTER — Other Ambulatory Visit: Payer: Self-pay

## 2020-03-21 ENCOUNTER — Telehealth (INDEPENDENT_AMBULATORY_CARE_PROVIDER_SITE_OTHER): Payer: Self-pay

## 2020-03-21 ENCOUNTER — Ambulatory Visit (INDEPENDENT_AMBULATORY_CARE_PROVIDER_SITE_OTHER): Payer: Medicare Other | Admitting: Nurse Practitioner

## 2020-03-21 VITALS — Temp 97.1°F | Ht 65.0 in | Wt 174.0 lb

## 2020-03-21 DIAGNOSIS — J329 Chronic sinusitis, unspecified: Secondary | ICD-10-CM

## 2020-03-21 DIAGNOSIS — R42 Dizziness and giddiness: Secondary | ICD-10-CM | POA: Diagnosis not present

## 2020-03-21 MED ORDER — AMOXICILLIN-POT CLAVULANATE 875-125 MG PO TABS: 1.0000 | ORAL_TABLET | Freq: Two times a day (BID) | ORAL | 0 refills | Status: DC

## 2020-03-21 MED ORDER — MECLIZINE HCL 25 MG PO TABS: 25.0000 mg | ORAL_TABLET | Freq: Three times a day (TID) | ORAL | 1 refills | Status: DC | PRN

## 2020-03-21 NOTE — Progress Notes (Signed)
Subjective:  Patient ID: Jessica Russell, female    DOB: 01/31/1928  Age: 86 y.o. MRN: [id number]  CC:  Chief Complaint  Patient presents with  . Dizziness    started about 4am this morning, left rib is sore      HPI  This patient arrives today for an acute visit for the above.  She tells me she started experiencing vertigo-like symptoms about 1 month ago that would occur right before she was lying down to go to bed but then would spontaneously resolve.  She tells me then this morning at 4 AM the symptoms got a bit worse when she is getting up to go the bathroom and she want to be evaluated.  She has had the symptoms in the past and has learned how to do Epley's maneuver, but tells me she did not feel well enough to complete this on her own at home.  Right now, her symptoms are a little less severe than they were this morning.  She does see ear nose and throat provider and does have a current acoustic neuroma.  She did undergo MRI last month which did show probable sinus infection which she has not had treated.  She tells me she has had some mild nausea but no vomiting.    Past Medical History:  Diagnosis Date  . Diabetic nephropathy ([place])   . Foot ulcer, right, with fat layer exposed ([place])    non-healing  . History of bilateral breast cancer 04-14-2018 per pt no recurrence   dx 2000 w/ right breast cancer, s/p  lumpectomy with node dissection completed radiation/  dx 2002 w/ left breast cancer, s/p lumpectomy with node dissection,  completed radiation  . History of diverticulitis of colon 2009   s/p  partial colectomy for perforated diverticulitis  . History of external beam radiation therapy    2000 for right breast cancer;   2002  for left breast cancer  (per pt 36 treatment for each breast)  . HLD (hyperlipidemia) 03/24/2017  . Neuropathy   . OA (osteoarthritis)   . Osteoporosis 04/14/2017  . Peripheral arterial occlusive disease ([place])    04-13-2018   s/p  balloon  angioplasty to the anterior tibial artery for severe stenosis right lower extremity (dr Fletcher Anon)  . Rheumatoid arthritis ([place]) 03/13/2019  . Type 2 diabetes mellitus ([place])   . Vitamin D deficiency disease 03/13/2019      Family History  Problem Relation Age of Onset  . Diabetes Mother   . Heart disease Mother   . Arthritis Mother   . Hypertension Mother   . Miscarriages / [place] Mother   . Early death Father   . Stroke Father   . Alcohol abuse Father   . Diabetes Sister   . Stroke Sister   . Diabetes Brother   . Diverticulitis Son   . Diabetes Sister   . Stroke Sister     Social History   Social History Narrative   Lives with husband [name]   One son [name] lives in [place]    Father was a [place] from [place], patient is bilingual   Married for over 61 years.    Caffeine use: 1 cup per day   Right handed   Social History   Tobacco Use  . Smoking status: Former Smoker    Packs/day: 1.00    Years: 12.00    Pack years: 12.00    Types: Cigarettes    Start date: 05/11/1950  Quit date: 05/12/1963    Years since quitting: 56.8  . Smokeless tobacco: Never Used  Substance Use Topics  . Alcohol use: No     Current Meds  Medication Sig  . aspirin EC 81 MG tablet Take 1 tablet (81 mg total) by mouth daily.  . carboxymethylcellul-glycerin (OPTIVE) 0.5-0.9 % ophthalmic solution Apply to eye.  . Cholecalciferol 1.25 MG (50000 UT) TABS Take 2 tablets by mouth.  . fluocinonide gel (LIDEX) 6.06 % Apply 1 application topically as needed (mouth sores).   . folic acid (FOLVITE) 1 MG tablet Take 1 mg by mouth daily.  [name] gabapentin (NEURONTIN) 400 MG capsule TAKE 2 CAPSULES BY MOUTH 4 TIMES A DAY  . ipratropium (ATROVENT) 0.06 % nasal spray Place 2 sprays into both nostrils 2 (two) times daily.  [name] latanoprost (XALATAN) 0.005 % ophthalmic solution   . lidocaine (LMX) 4 % cream Apply 1 application topically as needed (pain).  . melatonin 1 MG TABS tablet Take 3 mg by mouth at  bedtime.  . metFORMIN (GLUCOPHAGE) 500 MG tablet TAKE 1 TABLET BY MOUTH EVERY DAY  . methotrexate (RHEUMATREX) 2.5 MG tablet Take 15 mg by mouth once a week. Caution:Chemotherapy. Protect from light.  . Multiple Vitamins-Minerals (CENTRUM SILVER PO) Take by mouth daily.  . Polyvinyl Alcohol-Povidone (REFRESH OP) Apply to eye 2 (two) times daily as needed.  . Turmeric 1053 MG TABS Take 600 mg by mouth. With black pepper extract  . vitamin B-12 (CYANOCOBALAMIN) 500 MCG tablet Take 500 mcg by mouth daily.    ROS:  Review of Systems  Constitutional: Negative for fever.  HENT: Positive for tinnitus. Negative for congestion, ear pain, hearing loss and sinus pain.   Eyes: Negative for blurred vision.  Respiratory: Negative for shortness of breath.   Cardiovascular: Negative for chest pain and palpitations.  Gastrointestinal: Positive for nausea. Negative for vomiting.  Neurological: Positive for dizziness and headaches.     Objective:   Today's Vitals: Temp (!) 97.1 F (36.2 C) (Temporal)   Ht 5\' 5"  (1.651 m)   Wt 174 lb (78.9 kg)   SpO2 99%   BMI 28.96 kg/m  Vitals with BMI 03/21/2020 03/18/2020 03/05/2020  Height 5\' 5"  5\' 5"  5\' 5"   Weight 174 lbs 174 lbs 171 lbs  BMI 28.96 30.16 01.09  Systolic - 323 557  Diastolic - 78 70  Pulse - 64 69     Physical Exam Vitals reviewed.  Constitutional:      General: She is not in acute distress.    Appearance: Normal appearance.  HENT:     Head: Normocephalic and atraumatic.     Right Ear: Tympanic membrane, ear canal and external ear normal. Decreased hearing noted.     Left Ear: Hearing, ear canal and external ear normal. There is impacted cerumen.  Neck:     Vascular: No carotid bruit.  Cardiovascular:     Rate and Rhythm: Normal rate and regular rhythm.     Pulses: Normal pulses.     Heart sounds: Normal heart sounds.  Pulmonary:     Effort: Pulmonary effort is normal.     Breath sounds: Normal breath sounds.  Skin:     General: Skin is warm and dry.  Neurological:     General: No focal deficit present.     Mental Status: She is alert and oriented to person, place, and time.  Psychiatric:        Mood and Affect: Mood normal.  Behavior: Behavior normal.        Judgment: Judgment normal.          Assessment and Plan   1. Sinusitis, unspecified chronicity, unspecified location   2. Vertigo      Plan: 1., 2..  We will treat her for sinusitis and vertigo.  We will start her on a course of Augmentin.  Also encouraged her to start taking a daily over-the-counter allergy pill such as Claritin.  We will also prescribe as needed meclizine to see if this helps improve her symptoms.  She was warned that this medication can make her a bit sleepy so she should stay at home when she takes her first dose to see how it makes her feel.  She was told that if her symptoms persist or worsen next week that she should call either our office or her ear nose and throat doctor for further recommendations.  We did discuss risk of diarrhea with Augmentin and I recommended she try eating a daily probiotic yogurt to hopefully help keep this at [place].  She tells me she understands.   Tests ordered No orders of the defined types were placed in this encounter.     Meds ordered this encounter  Medications  . meclizine (ANTIVERT) 25 MG tablet    Sig: Take 1 tablet (25 mg total) by mouth 3 (three) times daily as needed for dizziness.    Dispense:  30 tablet    Refill:  1    Order Specific Question:   Supervising Provider    Answer:   Hurshel Party C [[id number]]  . amoxicillin-clavulanate (AUGMENTIN) 875-125 MG tablet    Sig: Take 1 tablet by mouth 2 (two) times daily.    Dispense:  10 tablet    Refill:  0    Order Specific Question:   Supervising Provider    Answer:   Doree Albee [[id number]]    Patient to follow-up as scheduled early next year, or sooner as needed.  Ailene Ards, NP

## 2020-03-21 NOTE — Telephone Encounter (Signed)
Please call this patient and let her know that I got her message. If she is having dizziness I would like to see her in the office today for acute visit. Verify that she is not having additional covid19 symptoms and has not been exposed. Of course if she has symptoms or has been exposed she needs to be seen by an urgent care that can address her dizziness and test her for covid. See if I have a 30-45 minute slot available this afternoon for acute visit.

## 2020-03-21 NOTE — Patient Instructions (Signed)
Claritin - take 1 tablet by mouth daily for around 2 weeks

## 2020-04-02 DIAGNOSIS — R338 Other retention of urine: Secondary | ICD-10-CM | POA: Diagnosis not present

## 2020-04-02 DIAGNOSIS — R82998 Other abnormal findings in urine: Secondary | ICD-10-CM | POA: Diagnosis not present

## 2020-04-02 DIAGNOSIS — Z4689 Encounter for fitting and adjustment of other specified devices: Secondary | ICD-10-CM | POA: Diagnosis not present

## 2020-04-02 DIAGNOSIS — N814 Uterovaginal prolapse, unspecified: Secondary | ICD-10-CM | POA: Diagnosis not present

## 2020-04-09 ENCOUNTER — Encounter: Payer: Self-pay | Admitting: Cardiovascular Disease

## 2020-04-09 ENCOUNTER — Other Ambulatory Visit: Payer: Self-pay

## 2020-04-09 ENCOUNTER — Ambulatory Visit (INDEPENDENT_AMBULATORY_CARE_PROVIDER_SITE_OTHER): Payer: Medicare Other | Admitting: Cardiovascular Disease

## 2020-04-09 VITALS — BP 140/70 | HR 67 | Ht 62.0 in | Wt 179.0 lb

## 2020-04-09 DIAGNOSIS — E785 Hyperlipidemia, unspecified: Secondary | ICD-10-CM | POA: Diagnosis not present

## 2020-04-09 DIAGNOSIS — I739 Peripheral vascular disease, unspecified: Secondary | ICD-10-CM | POA: Diagnosis not present

## 2020-04-09 NOTE — Progress Notes (Signed)
Cardiology Office Note   Date:  04/09/2020   ID:  Jessica Russell, DOB 26-Jan-1928, MRN [id number]  PCP:  Doree Albee, MD  Cardiologist:   Kathlyn Sacramento, MD   No chief complaint on file.     History of Present Illness: Jessica Russell is a 86 y.o. female who is here today for follow-up visit regarding peripheral arterial disease. She has known history of diabetes mellitus with peripheral neuropathy, rheumatoid arthritis and hyperlipidemia.  She was referred in 2019 by Dr. Fritzi Mandes for evaluation of peripheral arterial disease and nonhealing ulceration on the right foot.  ABI was 1.10 on the right and 1.06 on the left.  Toe brachial index was abnormal on the right at 0.46 and normal on the left.   I proceeded with angiography in December 2019 which showed no significant aortoiliac disease.  On the right side, there was severe stenosis in the mid anterior tibial artery with occluded dorsalis pedis distally, occluded TP trunk with reconstitution of the peroneal artery proximally via extensive collaterals from the anterior tibial and reconstitution of the distal posterior tibial artery via collaterals from the peroneal artery.  I performed successful balloon angioplasty to the anterior tibial artery. She ultimately underwent resection of first transmetatarsal bone with complete healing.    She has been doing well with no recent chest pain or shortness of breath.  No leg claudication or ulceration.  She does complain of significant numbness thought to be due to neuropathy.  She has acoustic neuroma which is being observed.   Most recent vascular studies in September showed normal ABI bilaterally and patent right anterior tibial artery.  Past Medical History:  Diagnosis Date  . Diabetic nephropathy ([place])   . Foot ulcer, right, with fat layer exposed ([name])    non-healing  . History of bilateral breast cancer 04-14-2018 per pt no recurrence   dx 2000 w/ right breast cancer, s/p   lumpectomy with node dissection completed radiation/  dx 2002 w/ left breast cancer, s/p lumpectomy with node dissection,  completed radiation  . History of diverticulitis of colon 2009   s/p  partial colectomy for perforated diverticulitis  . History of external beam radiation therapy    2000 for right breast cancer;   2002  for left breast cancer  (per pt 36 treatment for each breast)  . HLD (hyperlipidemia) 03/24/2017  . Neuropathy   . OA (osteoarthritis)   . Osteoporosis 04/14/2017  . Peripheral arterial occlusive disease ([name])    04-13-2018   s/p  balloon angioplasty to the anterior tibial artery for severe stenosis right lower extremity (dr Fletcher Anon)  . Rheumatoid arthritis ([place]) 03/13/2019  . Type 2 diabetes mellitus ([name])   . Vitamin D deficiency disease 03/13/2019    Past Surgical History:  Procedure Laterality Date  . ABDOMINAL AORTOGRAM W/LOWER EXTREMITY N/A 04/13/2018   Procedure: ABDOMINAL AORTOGRAM W/LOWER EXTREMITY;  Surgeon: [name], MD;  Location: [facility];  Service: Cardiovascular;  Laterality: N/A;  . APPENDECTOMY  age 87  . BREAST LUMPECTOMY WITH AXILLARY LYMPH NODE DISSECTION Bilateral right 2000;   left 2002  . BUNIONECTOMY Bilateral 1968  . CARPAL TUNNEL RELEASE Right 2017  . CATARACT EXTRACTION W/PHACO Right 08/30/2017   Procedure: CATARACT EXTRACTION PHACO AND INTRAOCULAR LENS PLACEMENT RIGHT EYE;  Surgeon: [name], MD;  Location: [facility];  Service: Ophthalmology;  Laterality: Right;  CDE: 13.63  . CATARACT EXTRACTION W/PHACO Left 09/20/2017   Procedure: CATARACT EXTRACTION PHACO  AND INTRAOCULAR LENS PLACEMENT LEFT EYE;  Surgeon: [name], MD;  Location: [facility];  Service: Ophthalmology;  Laterality: Left;  CDE: 11.25  . D & C HYSTEROSCOPY W/ RESECTION POLYP  2017   per pt benign  . FOOT SURGERY     removal of two bones  . FRACTURE SURGERY Left 05/12/2016   patella fracture,   per pt no retatined hardware  . GRAFT APPLICATION N/A 42/10/8339    Procedure: APPLICATION OF STRAVIX GRAFT;  Surgeon: [name], DPM;  Location: [facility];  Service: Podiatry;  Laterality: N/A;  . HEMICOLECTOMY  2009   diverticulitis w/ perforation  . METATARSAL HEAD EXCISION N/A 04/18/2018   Procedure: FIRST METATARSAL HEAD RECESSION, WOUND DEBRIEDMENT;  Surgeon: [name], DPM;  Location: [facility];  Service: Podiatry;  Laterality: N/A;  . NASAL SEPTOPLASTY W/ TURBINOPLASTY Bilateral 01/04/2018   Procedure: NASAL SEPTOPLASTY WITH BILATERAL TURBINATE REDUCTION;  Surgeon: [name], MD;  Location: [facility];  Service: ENT;  Laterality: Bilateral;  . PERIPHERAL VASCULAR BALLOON ANGIOPLASTY  04/13/2018   Procedure: PERIPHERAL VASCULAR BALLOON ANGIOPLASTY;  Surgeon: [name], MD;  Location: [facility];  Service: Cardiovascular;;  Anterior Tibial  . SKIN GRAFT     left foot   . VENTRAL HERNIA REPAIR  01-07-2016    [name], [place]   w/ mesh     Current Outpatient Medications  Medication Sig Dispense Refill  . aspirin EC 81 MG tablet Take 1 tablet (81 mg total) by mouth daily. 90 tablet 3  . carboxymethylcellul-glycerin (OPTIVE) 0.5-0.9 % ophthalmic solution Apply to eye.    . Cholecalciferol 1.25 MG (50000 UT) TABS Take 2 tablets by mouth.    . fluocinonide gel (LIDEX) 9.62 % Apply 1 application topically as needed (mouth sores).   0  . folic acid (FOLVITE) 1 MG tablet Take 1 mg by mouth daily.    [name] gabapentin (NEURONTIN) 400 MG capsule TAKE 2 CAPSULES BY MOUTH 4 TIMES A DAY 720 capsule 0  . ipratropium (ATROVENT) 0.06 % nasal spray Place 2 sprays into both nostrils 2 (two) times daily.    [name] latanoprost (XALATAN) 0.005 % ophthalmic solution     . lidocaine (LMX) 4 % cream Apply 1 application topically as needed (pain).    . meclizine (ANTIVERT) 25 MG tablet Take 1 tablet (25 mg total) by mouth 3 (three) times daily as needed for dizziness. 30 tablet 1  . melatonin 1 MG TABS tablet Take 3  mg by mouth at bedtime.    . metFORMIN (GLUCOPHAGE) 500 MG tablet TAKE 1 TABLET BY MOUTH EVERY DAY 90 tablet 1  . methotrexate (RHEUMATREX) 2.5 MG tablet Take 15 mg by mouth once a week. Caution:Chemotherapy. Protect from light.    . Multiple Vitamins-Minerals (CENTRUM SILVER PO) Take by mouth daily.    . Polyvinyl Alcohol-Povidone (REFRESH OP) Apply to eye 2 (two) times daily as needed.    . Turmeric 1053 MG TABS Take 600 mg by mouth. With black pepper extract    . vitamin B-12 (CYANOCOBALAMIN) 500 MCG tablet Take 500 mcg by mouth daily.     No current facility-administered medications for this visit.    Allergies:   Phenergan [promethazine hcl], Ciprofloxacin, and Levofloxacin    Social History:  The patient  reports that she quit smoking about 56 years ago. Her smoking use included cigarettes. She started smoking about 69 years ago. She has a 12.00 pack-year smoking history. She  has never used smokeless tobacco. She reports that she does not drink alcohol and does not use drugs.   Family History:  The patient's family history includes Alcohol abuse in her father; Arthritis in her mother; Diabetes in her brother, mother, sister, and sister; Diverticulitis in her son; Early death in her father; Heart disease in her mother; Hypertension in her mother; Miscarriages / Stillbirths in her mother; Stroke in her father, sister, and sister.    ROS:  Please see the history of present illness.   Otherwise, review of systems are positive for none.   All other systems are reviewed and negative.    PHYSICAL EXAM: VS:  BP 140/70   Pulse 67   Ht 5\' 2"  (1.575 m)   Wt 179 lb (81.2 kg)   SpO2 97%   BMI 32.74 kg/m  , BMI Body mass index is 32.74 kg/m. GEN: Well nourished, well developed, in no acute distress  HEENT: normal  Neck: no JVD, carotid bruits, or masses Cardiac: RRR; no  rubs, or gallops,no edema .  2 out of 6 systolic murmur in the aortic area which is early peaking. Respiratory:  clear  to auscultation bilaterally, normal work of breathing GI: soft, nontender, nondistended, + BS MS: no deformity or atrophy  Skin: warm and dry, no rash Neuro:  Strength and sensation are intact Psych: euthymic mood, full affect Vascular: Femoral pulses normal bilaterally.  Dorsalis pedis is palpable on the right side and absent on the left side.  Posterior tibial is absent on the right side but palpable on the left.     EKG:  EKG is not ordered today.    Recent Labs: 02/07/2020: ALT 17; BUN 19; Creat 0.72; Potassium 4.5; Sodium 139    Lipid Panel    Component Value Date/Time   CHOL 197 02/07/2020 1410   TRIG 508 (H) 02/07/2020 1410   HDL 40 (L) 02/07/2020 1410   CHOLHDL 4.9 02/07/2020 1410   LDLCALC  02/07/2020 1410     Comment:     . LDL cholesterol not calculated. Triglyceride levels greater than 400 mg/dL invalidate calculated LDL results. . Reference range: <100 . Desirable range <100 mg/dL for primary prevention;   <70 mg/dL for patients with CHD or diabetic patients  with > or = 2 CHD risk factors. [name] LDL-C is now calculated using the Martin-Hopkins  calculation, which is a validated novel method providing  better accuracy than the Friedewald equation in the  estimation of LDL-C.  Cresenciano Genre et al. Annamaria Helling. 2836;629(47): 2061-2068  (http://education.[employer].com/faq/FAQ164)       Wt Readings from Last 3 Encounters:  04/09/20 179 lb (81.2 kg)  03/21/20 174 lb (78.9 kg)  03/18/20 174 lb (78.9 kg)       No flowsheet data found.    ASSESSMENT AND PLAN:  1.  Peripheral arterial disease with previous nonhealing ulceration on the bottom of the right big toe: Status post balloon angioplasty of the right anterior tibial artery.  No recurrent ulceration or claudication.  She has a palpable dorsalis pedis on the right side.  Most recent Doppler showed normal ABI and patent right anterior tibial artery  2.  Hyperlipidemia: Most recent lipid profile showed  significant elevation in triglyceride which was 548.  This is being managed by primary care physician.  Disposition:   FU with me in 12 months  Signed,  Kathlyn Sacramento, MD  04/09/2020 12:46 PM    [place]

## 2020-04-09 NOTE — Patient Instructions (Signed)

## 2020-04-16 ENCOUNTER — Other Ambulatory Visit: Payer: Self-pay

## 2020-04-16 DIAGNOSIS — I83892 Varicose veins of left lower extremities with other complications: Secondary | ICD-10-CM

## 2020-04-24 DIAGNOSIS — M2550 Pain in unspecified joint: Secondary | ICD-10-CM | POA: Diagnosis not present

## 2020-04-24 DIAGNOSIS — Z6830 Body mass index (BMI) 30.0-30.9, adult: Secondary | ICD-10-CM | POA: Diagnosis not present

## 2020-04-24 DIAGNOSIS — M0579 Rheumatoid arthritis with rheumatoid factor of multiple sites without organ or systems involvement: Secondary | ICD-10-CM | POA: Diagnosis not present

## 2020-04-24 DIAGNOSIS — R5382 Chronic fatigue, unspecified: Secondary | ICD-10-CM | POA: Diagnosis not present

## 2020-04-24 DIAGNOSIS — M150 Primary generalized (osteo)arthritis: Secondary | ICD-10-CM | POA: Diagnosis not present

## 2020-04-24 DIAGNOSIS — E669 Obesity, unspecified: Secondary | ICD-10-CM | POA: Diagnosis not present

## 2020-04-25 ENCOUNTER — Ambulatory Visit (INDEPENDENT_AMBULATORY_CARE_PROVIDER_SITE_OTHER): Payer: Medicare Other | Admitting: Physician Assistant

## 2020-04-25 ENCOUNTER — Ambulatory Visit (HOSPITAL_COMMUNITY)
Admission: RE | Admit: 2020-04-25 | Discharge: 2020-04-25 | Disposition: A | Discharge status: Home / Self Care | Payer: Medicare Other | Source: Ambulatory Visit | Attending: Physician Assistant | Admitting: Physician Assistant

## 2020-04-25 ENCOUNTER — Other Ambulatory Visit: Payer: Self-pay

## 2020-04-25 VITALS — BP 132/66 | HR 65 | Temp 98.0°F | Resp 20 | Ht 62.0 in | Wt 171.1 lb

## 2020-04-25 DIAGNOSIS — I872 Venous insufficiency (chronic) (peripheral): Secondary | ICD-10-CM

## 2020-04-25 DIAGNOSIS — I83892 Varicose veins of left lower extremities with other complications: Secondary | ICD-10-CM | POA: Insufficient documentation

## 2020-04-25 NOTE — Progress Notes (Signed)
new

## 2020-04-25 NOTE — Progress Notes (Signed)
Office Note     CC:  follow up Requesting Provider:  Doree Albee, MD  HPI: Jessica Russell is a 86 y.o. (November 26, 1927) female who presents for evaluation of varicose veins of left lower extremity.  Patient states she has occasional swelling of left foot and ankle however this is not bothersome.  She has occasional episodes of pain and is able to feel lumps in her medial left lower leg.  This however last for only several days and then goes away.  There is no recognizable pattern.  She denies any history of DVT, venous ulcerations, trauma, or prior vascular procedures on the left leg.  She does not elevate her legs during the day and does not wear compression stockings and has no interest in doing so.  Past medical history also significant for PAD.  This is followed by Dr. Fletcher Russell who in the past performed right anterior tibial artery angioplasty due to a wound on her right foot.  This wound has since healed with revascularization and wound care.  She denies tobacco use.  Past Medical History:  Diagnosis Date  . Diabetic nephropathy ([place])   . Foot ulcer, right, with fat layer exposed ([place])    non-healing  . History of bilateral breast cancer 04-14-2018 per pt no recurrence   dx 2000 w/ right breast cancer, s/p  lumpectomy with node dissection completed radiation/  dx 2002 w/ left breast cancer, s/p lumpectomy with node dissection,  completed radiation  . History of diverticulitis of colon 2009   s/p  partial colectomy for perforated diverticulitis  . History of external beam radiation therapy    2000 for right breast cancer;   2002  for left breast cancer  (per pt 36 treatment for each breast)  . HLD (hyperlipidemia) 03/24/2017  . Neuropathy   . OA (osteoarthritis)   . Osteoporosis 04/14/2017  . Peripheral arterial occlusive disease ([place])    04-13-2018   s/p  balloon angioplasty to the anterior tibial artery for severe stenosis right lower extremity (dr Jessica Russell)  . Rheumatoid arthritis  ([place]) 03/13/2019  . Type 2 diabetes mellitus ([place])   . Vitamin D deficiency disease 03/13/2019    Past Surgical History:  Procedure Laterality Date  . ABDOMINAL AORTOGRAM W/LOWER EXTREMITY N/A 04/13/2018   Procedure: ABDOMINAL AORTOGRAM W/LOWER EXTREMITY;  Surgeon: Jessica Hampshire, MD;  Location: [facility];  Service: Cardiovascular;  Laterality: N/A;  . APPENDECTOMY  age 11  . BREAST LUMPECTOMY WITH AXILLARY LYMPH NODE DISSECTION Bilateral right 2000;   left 2002  . BUNIONECTOMY Bilateral 1968  . CARPAL TUNNEL RELEASE Right 2017  . CATARACT EXTRACTION W/PHACO Right 08/30/2017   Procedure: CATARACT EXTRACTION PHACO AND INTRAOCULAR LENS PLACEMENT RIGHT EYE;  Surgeon: [name], MD;  [facility];  Service: Ophthalmology;  Laterality: Right;  CDE: 13.63  . CATARACT EXTRACTION W/PHACO Left 09/20/2017   Procedure: CATARACT EXTRACTION PHACO AND INTRAOCULAR LENS PLACEMENT LEFT EYE;  Surgeon: [name], MD;  Location[facility];  Service: Ophthalmology;  Laterality: Left;  CDE: 11.25  . D & C HYSTEROSCOPY W/ RESECTION POLYP  2017   per pt benign  . FOOT SURGERY     removal of two bones  . FRACTURE SURGERY Left 05/12/2016   patella fracture,   per pt no retatined hardware  . GRAFT APPLICATION N/A 27/11/4126   Procedure: APPLICATION OF STRAVIX GRAFT;  Surgeon: Jessica Russell, DPM;  Location: [facility];  Service: Podiatry;  Laterality: N/A;  .  HEMICOLECTOMY  2009   diverticulitis w/ perforation  . METATARSAL HEAD EXCISION N/A 04/18/2018   Procedure: FIRST METATARSAL HEAD RECESSION, WOUND DEBRIEDMENT;  Surgeon: Jessica Russell, DPM;  Location: [facility];  Service: Podiatry;  Laterality: N/A;  . NASAL SEPTOPLASTY W/ TURBINOPLASTY Bilateral 01/04/2018   Procedure: NASAL SEPTOPLASTY WITH BILATERAL TURBINATE REDUCTION;  Surgeon: [name], MD;  Location: [facility];  Service: ENT;  Laterality: Bilateral;  . PERIPHERAL VASCULAR BALLOON  ANGIOPLASTY  04/13/2018   Procedure: PERIPHERAL VASCULAR BALLOON ANGIOPLASTY;  Surgeon: Jessica Hampshire, MD;  Location: [facility];  Service: Cardiovascular;;  Anterior Tibial  . SKIN GRAFT     left foot   . VENTRAL HERNIA REPAIR  01-07-2016    Jessica Russell, [place]   w/ mesh    Social History   Socioeconomic History  . Marital status: Married    Spouse name: Not on file  . Number of children: 1  . Years of education: 35  . Highest education level: Not on file  Occupational History  . Occupation: retired  Tobacco Use  . Smoking status: Former Smoker    Packs/day: 1.00    Years: 12.00    Pack years: 12.00    Types: Cigarettes    Start date: 05/11/1950    Quit date: 05/12/1963    Years since quitting: 56.9  . Smokeless tobacco: Never Used  Vaping Use  . Vaping Use: Never used  Substance and Sexual Activity  . Alcohol use: No  . Drug use: No  . Sexual activity: Not Currently    Birth control/protection: Post-menopausal  Other Topics Concern  . Not on file  Social History Narrative   Lives with husband Jessica Russell   One son Jessica Russell lives in [place]    Father was a [place] from [place], patient is bilingual   Married for over 62 years.    Caffeine use: 1 cup per day   Right handed   Social Determinants of Health   Financial Resource Strain: Not on file  Food Insecurity: Not on file  Transportation Needs: Not on file  Physical Activity: Not on file  Stress: Not on file  Social Connections: Not on file  Intimate Partner Violence: Not on file    Family History  Problem Relation Age of Onset  . Diabetes Mother   . Heart disease Mother   . Arthritis Mother   . Hypertension Mother   . Miscarriages / [place] Mother   . Early death Father   . Stroke Father   . Alcohol abuse Father   . Diabetes Sister   . Stroke Sister   . Diabetes Brother   . Diverticulitis Son   . Diabetes Sister   . Stroke Sister     Current Outpatient Medications  Medication Sig Dispense  Refill  . aspirin EC 81 MG tablet Take 1 tablet (81 mg total) by mouth daily. 90 tablet 3  . carboxymethylcellul-glycerin (OPTIVE) 0.5-0.9 % ophthalmic solution Apply to eye.    . Cholecalciferol 1.25 MG (50000 UT) TABS Take 2 tablets by mouth.    . fluocinonide gel (LIDEX) 9.39 % Apply 1 application topically as needed (mouth sores).   0  . folic acid (FOLVITE) 1 MG tablet Take 1 mg by mouth daily.    [name] gabapentin (NEURONTIN) 400 MG capsule TAKE 2 CAPSULES BY MOUTH 4 TIMES A DAY 720 capsule 0  . ipratropium (ATROVENT) 0.06 % nasal spray Place 2 sprays into both nostrils 2 (two)  times daily.    [name] latanoprost (XALATAN) 0.005 % ophthalmic solution     . lidocaine (LMX) 4 % cream Apply 1 application topically as needed (pain).    . meclizine (ANTIVERT) 25 MG tablet Take 1 tablet (25 mg total) by mouth 3 (three) times daily as needed for dizziness. 30 tablet 1  . metFORMIN (GLUCOPHAGE) 500 MG tablet TAKE 1 TABLET BY MOUTH EVERY DAY 90 tablet 1  . methotrexate (RHEUMATREX) 2.5 MG tablet Take 15 mg by mouth once a week. Caution:Chemotherapy. Protect from light.    . Multiple Vitamins-Minerals (CENTRUM SILVER PO) Take by mouth daily.    . Polyvinyl Alcohol-Povidone (REFRESH OP) Apply to eye 2 (two) times daily as needed.    . Turmeric 1053 MG TABS Take 600 mg by mouth. With black pepper extract    . vitamin B-12 (CYANOCOBALAMIN) 500 MCG tablet Take 500 mcg by mouth daily.     No current facility-administered medications for this visit.    Allergies  Allergen Reactions  . Phenergan [Promethazine Hcl] Other (See Comments)    DELIRIUM (HALLUCINATIONS)  . Ciprofloxacin Swelling  . Levofloxacin Swelling     REVIEW OF SYSTEMS:   [X]  denotes positive finding, [ ]  denotes negative finding Cardiac  Comments:  Chest pain or chest pressure:    Shortness of breath upon exertion:    Short of breath when lying flat:    Irregular heart rhythm:        Vascular    Pain in calf, thigh, or hip brought  on by ambulation:    Pain in feet at night that wakes you up from your sleep:     Blood clot in your veins:    Leg swelling:         Pulmonary    Oxygen at home:    Productive cough:     Wheezing:         Neurologic    Sudden weakness in arms or legs:     Sudden numbness in arms or legs:     Sudden onset of difficulty speaking or slurred speech:    Temporary loss of vision in one eye:     Problems with dizziness:         Gastrointestinal    Blood in stool:     Vomited blood:         Genitourinary    Burning when urinating:     Blood in urine:        Psychiatric    Major depression:         Hematologic    Bleeding problems:    Problems with blood clotting too easily:        Skin    Rashes or ulcers:        Constitutional    Fever or chills:      PHYSICAL EXAMINATION:  Vitals:   04/25/20 1346  BP: 132/66  Pulse: 65  Resp: 20  Temp: 98 F (36.7 C)  TempSrc: Temporal  SpO2: 97%  Weight: 171 lb 1.6 oz (77.6 kg)  Height: 5\' 2"  (1.575 m)    General:  WDWN in NAD; vital signs documented above Gait: Not observed HENT: WNL, normocephalic Pulmonary: normal non-labored breathing , without Rales, rhonchi,  wheezing Cardiac: regular HR Abdomen: soft, NT, no masses Skin: without rashes Vascular Exam/Pulses:  Right Left  Radial 2+ (normal) 2+ (normal)  DP 1+ (weak) absent  PT absent 2+ (normal)   Extremities: without ischemic changes, without  Gangrene , without cellulitis; without open wounds; palpable varicose veins of left medial lower leg; some stasis pigmentation changes of left lower extremity; no venous ulcerations or weeping Musculoskeletal: no muscle wasting or atrophy  Neurologic: A&O X 3;  No focal weakness or paresthesias are detected Psychiatric:  The pt has Normal affect.   Non-Invasive Vascular Imaging:   Left lower extremity reflux study negative for DVT Left common femoral vein insufficiency Incompetent left greater saphenous vein from the  saphenofemoral junction down to the knee however diameter is less than 4 mm    ASSESSMENT/PLAN:: 86 y.o. female here for evaluation of varicose veins of left lower extremity  -Left lower extremity venous reflux study is negative for DVT however demonstrates deep venous reflux in the common femoral vein and superficial venous reflux in the greater saphenous vein from the saphenofemoral junction down to the knee.  Despite superficial reflux noted the diameter of the greater saphenous vein is smaller than 4 mm -Based on patient's history it sounds like she is having periodic episodes of superficial thrombophlebitis of the varicose veins in her left medial lower leg.  These episodes do not cause significant discomfort for her however she is concerned as this may be related to an underlying "serious" problem. -Recommendations included knee-high 15 to 20 mmHg compression stockings and periodic elevation of her legs during the day.  I also educated the patient to use warm compress and take NSAIDs when she experiences an episode of superficial thrombophlebitis.  The patient is not interested in wearing compression or making an effort to elevate her legs during the day.  She is however satisfied knowing that there is not a worrisome underlying problem with her veins. -She may follow-up on an as-needed basis   Dagoberto Ligas, PA-C [facility] [phone]  Clinic MD:   Scot Dock

## 2020-05-15 DIAGNOSIS — N398 Other specified disorders of urinary system: Secondary | ICD-10-CM | POA: Insufficient documentation

## 2020-05-30 ENCOUNTER — Other Ambulatory Visit (INDEPENDENT_AMBULATORY_CARE_PROVIDER_SITE_OTHER): Payer: Self-pay | Admitting: Internal Medicine

## 2020-05-30 DIAGNOSIS — E1142 Type 2 diabetes mellitus with diabetic polyneuropathy: Secondary | ICD-10-CM

## 2020-06-03 ENCOUNTER — Other Ambulatory Visit (INDEPENDENT_AMBULATORY_CARE_PROVIDER_SITE_OTHER): Payer: Self-pay | Admitting: Internal Medicine

## 2020-06-03 DIAGNOSIS — E1142 Type 2 diabetes mellitus with diabetic polyneuropathy: Secondary | ICD-10-CM

## 2020-06-24 ENCOUNTER — Emergency Department (HOSPITAL_COMMUNITY)
Admission: EM | Admit: 2020-06-24 | Discharge: 2020-06-24 | Disposition: A | Discharge status: Home / Self Care | Payer: Medicare HMO | Attending: Emergency Medicine | Admitting: Emergency Medicine

## 2020-06-24 ENCOUNTER — Other Ambulatory Visit: Payer: Self-pay

## 2020-06-24 ENCOUNTER — Encounter (HOSPITAL_COMMUNITY): Payer: Self-pay

## 2020-06-24 ENCOUNTER — Emergency Department (HOSPITAL_COMMUNITY): Payer: Medicare HMO

## 2020-06-24 DIAGNOSIS — K6289 Other specified diseases of anus and rectum: Secondary | ICD-10-CM | POA: Diagnosis not present

## 2020-06-24 DIAGNOSIS — R1084 Generalized abdominal pain: Secondary | ICD-10-CM | POA: Insufficient documentation

## 2020-06-24 DIAGNOSIS — Z95820 Peripheral vascular angioplasty status with implants and grafts: Secondary | ICD-10-CM | POA: Diagnosis not present

## 2020-06-24 DIAGNOSIS — Z951 Presence of aortocoronary bypass graft: Secondary | ICD-10-CM | POA: Diagnosis not present

## 2020-06-24 DIAGNOSIS — Z79899 Other long term (current) drug therapy: Secondary | ICD-10-CM | POA: Insufficient documentation

## 2020-06-24 DIAGNOSIS — Z87891 Personal history of nicotine dependence: Secondary | ICD-10-CM | POA: Insufficient documentation

## 2020-06-24 DIAGNOSIS — Z7982 Long term (current) use of aspirin: Secondary | ICD-10-CM | POA: Insufficient documentation

## 2020-06-24 DIAGNOSIS — Z7984 Long term (current) use of oral hypoglycemic drugs: Secondary | ICD-10-CM | POA: Insufficient documentation

## 2020-06-24 DIAGNOSIS — E1140 Type 2 diabetes mellitus with diabetic neuropathy, unspecified: Secondary | ICD-10-CM | POA: Insufficient documentation

## 2020-06-24 DIAGNOSIS — Z923 Personal history of irradiation: Secondary | ICD-10-CM | POA: Diagnosis not present

## 2020-06-24 DIAGNOSIS — K5900 Constipation, unspecified: Secondary | ICD-10-CM | POA: Diagnosis not present

## 2020-06-24 DIAGNOSIS — G8918 Other acute postprocedural pain: Secondary | ICD-10-CM

## 2020-06-24 DIAGNOSIS — Z853 Personal history of malignant neoplasm of breast: Secondary | ICD-10-CM | POA: Insufficient documentation

## 2020-06-24 DIAGNOSIS — Z8522 Personal history of malignant neoplasm of nasal cavities, middle ear, and accessory sinuses: Secondary | ICD-10-CM | POA: Insufficient documentation

## 2020-06-24 LAB — COMPREHENSIVE METABOLIC PANEL
ALT: 21 U/L (ref 0–44)
AST: 26 U/L (ref 15–41)
Albumin: 4.1 g/dL (ref 3.5–5.0)
Alkaline Phosphatase: 60 U/L (ref 38–126)
Anion gap: 13 (ref 5–15)
BUN: 23 mg/dL (ref 8–23)
CO2: 24 mmol/L (ref 22–32)
Calcium: 9.4 mg/dL (ref 8.9–10.3)
Chloride: 99 mmol/L (ref 98–111)
Creatinine, Ser: 0.72 mg/dL (ref 0.44–1.00)
GFR, Estimated: >60 mL/min (ref >60)
Glucose, Bld: 161 mg/dL — ABNORMAL HIGH (ref 70–99)
Potassium: 3.9 mmol/L (ref 3.5–5.1)
Sodium: 136 mmol/L (ref 135–145)
Total Bilirubin: 0.8 mg/dL (ref 0.3–1.2)
Total Protein: 7.1 g/dL (ref 6.5–8.1)

## 2020-06-24 LAB — URINALYSIS, ROUTINE W REFLEX MICROSCOPIC
Bacteria, UA: NONE SEEN
Bilirubin Urine: NEGATIVE
Glucose, UA: NEGATIVE mg/dL
Ketones, ur: NEGATIVE mg/dL
Leukocytes,Ua: NEGATIVE
Nitrite: NEGATIVE
Protein, ur: NEGATIVE mg/dL
Specific Gravity, Urine: 1.029 (ref 1.005–1.030)
pH: 8 (ref 5.0–8.0)

## 2020-06-24 LAB — CBC WITH DIFFERENTIAL/PLATELET
Abs Immature Granulocytes: 0.03 10*3/uL (ref 0.00–0.07)
Basophils Absolute: 0.1 10*3/uL (ref 0.0–0.1)
Basophils Relative: 1 %
Eosinophils Absolute: 0.1 10*3/uL (ref 0.0–0.5)
Eosinophils Relative: 1 %
HCT: 38.3 % (ref 36.0–46.0)
Hemoglobin: 12.4 g/dL (ref 12.0–15.0)
Immature Granulocytes: 0 %
Lymphocytes Relative: 8 %
Lymphs Abs: 0.6 10*3/uL — ABNORMAL LOW (ref 0.7–4.0)
MCH: 30.9 pg (ref 26.0–34.0)
MCHC: 32.4 g/dL (ref 30.0–36.0)
MCV: 95.5 fL (ref 80.0–100.0)
Monocytes Absolute: 0.5 10*3/uL (ref 0.1–1.0)
Monocytes Relative: 6 %
Neutro Abs: 6.2 10*3/uL (ref 1.7–7.7)
Neutrophils Relative %: 84 %
Platelets: 198 10*3/uL (ref 150–400)
RBC: 4.01 MIL/uL (ref 3.87–5.11)
RDW: 14.5 % (ref 11.5–15.5)
WBC: 7.4 10*3/uL (ref 4.0–10.5)
nRBC: 0 % (ref 0.0–0.2)

## 2020-06-24 LAB — LACTIC ACID, PLASMA: Lactic Acid, Venous: 3.2 mmol/L (ref 0.5–1.9)

## 2020-06-24 LAB — LIPASE, BLOOD: Lipase: 23 U/L (ref 11–51)

## 2020-06-24 MED ORDER — HYDROMORPHONE HCL 1 MG/ML IJ SOLN
0.5000 mg | Freq: Once | INTRAMUSCULAR | Status: AC
  Administered 2020-06-24: 0.5 mg via INTRAVENOUS
  Filled 2020-06-24: qty 1

## 2020-06-24 MED ORDER — DOCUSATE SODIUM 100 MG PO CAPS: 100.0000 mg | ORAL_CAPSULE | Freq: Two times a day (BID) | ORAL | 0 refills | Status: DC | PRN

## 2020-06-24 MED ORDER — ONDANSETRON HCL 4 MG/2ML IJ SOLN
4.0000 mg | Freq: Once | INTRAMUSCULAR | Status: AC
  Administered 2020-06-24: 4 mg via INTRAVENOUS
  Filled 2020-06-24: qty 2

## 2020-06-24 MED ORDER — SODIUM CHLORIDE 0.9 % IV BOLUS
1000.0000 mL | Freq: Once | INTRAVENOUS | Status: AC
  Administered 2020-06-24: 1000 mL via INTRAVENOUS

## 2020-06-24 MED ORDER — ONDANSETRON 4 MG PO TBDP: 4.0000 mg | ORAL_TABLET | Freq: Three times a day (TID) | ORAL | 0 refills | Status: DC | PRN

## 2020-06-24 MED ORDER — MAGNESIUM CITRATE PO SOLN
1.0000 | Freq: Once | ORAL | Status: AC
  Administered 2020-06-24: 1 via ORAL
  Filled 2020-06-24: qty 296

## 2020-06-24 MED ORDER — IOHEXOL 300 MG/ML  SOLN
100.0000 mL | Freq: Once | INTRAMUSCULAR | Status: AC | PRN
  Administered 2020-06-24: 100 mL via INTRAVENOUS

## 2020-06-24 MED ORDER — MORPHINE SULFATE (PF) 2 MG/ML IV SOLN
2.0000 mg | Freq: Once | INTRAVENOUS | Status: AC
Start: 2020-06-24 — End: 2020-06-24
  Administered 2020-06-24: 2 mg via INTRAVENOUS
  Filled 2020-06-24: qty 1

## 2020-06-24 MED ORDER — OXYCODONE-ACETAMINOPHEN 5-325 MG PO TABS: 1.0000 | ORAL_TABLET | Freq: Four times a day (QID) | ORAL | 0 refills | Status: DC | PRN

## 2020-06-24 MED ORDER — MORPHINE SULFATE (PF) 2 MG/ML IV SOLN
2.0000 mg | Freq: Once | INTRAVENOUS | Status: AC
  Administered 2020-06-24: 2 mg via INTRAVENOUS
  Filled 2020-06-24: qty 1

## 2020-06-24 NOTE — ED Notes (Signed)
Date and time results received: 06/24/20 0814 (use smartphrase ".now" to insert current time)  Test: Lactic Acid Critical Value: 3.2  Name of Provider Notified:Dr Melina Copa  Orders Received? Or Actions Taken?: NA

## 2020-06-24 NOTE — Discharge Instructions (Addendum)
You were seen in the emergency department for worsening rectal pain after surgery.  You had blood work and a CAT scan that did not show any significant abnormalities.  You were given enemas with improvement in your constipation.  Please take your pain medicine on schedule for the next few days.  Drink plenty of fluids and use a laxative.  We are also prescribing you some nausea medication.  Please contact your gynecologist for close follow-up.  Return to the emergency department if any worsening or concerning symptoms

## 2020-06-24 NOTE — ED Provider Notes (Signed)
[facility] EMERGENCY DEPARTMENT Provider Note   CSN: [id number] Arrival date & time: 06/24/20  0457     History Chief Complaint  Patient presents with  . Constipation    Jessica Russell is a 87 y.o. female.  Patient is 4 days status post anterior colporrhaphy at [facility].  Patient experiencing progressively worsening rectal pain, urge to defecate and inability to have a bowel movement.  No nausea or vomiting.  No fever.  Patient with mild and diffuse abdominal discomfort as well.  Has been using OTC stool softeners.        Past Medical History:  Diagnosis Date  . Diabetic nephropathy ([place])   . Foot ulcer, right, with fat layer exposed ([place])    non-healing  . History of bilateral breast cancer 04-14-2018 per pt no recurrence   dx 2000 w/ right breast cancer, s/p  lumpectomy with node dissection completed radiation/  dx 2002 w/ left breast cancer, s/p lumpectomy with node dissection,  completed radiation  . History of diverticulitis of colon 2009   s/p  partial colectomy for perforated diverticulitis  . History of external beam radiation therapy    2000 for right breast cancer;   2002  for left breast cancer  (per pt 36 treatment for each breast)  . HLD (hyperlipidemia) 03/24/2017  . Neuropathy   . OA (osteoarthritis)   . Osteoporosis 04/14/2017  . Peripheral arterial occlusive disease ([place])    04-13-2018   s/p  balloon angioplasty to the anterior tibial artery for severe stenosis right lower extremity (dr Fletcher Anon)  . Rheumatoid arthritis ([place]) 03/13/2019  . Type 2 diabetes mellitus ([place])   . Vitamin D deficiency disease 03/13/2019    Patient Active Problem List   Diagnosis Date Noted  . Incomplete emptying of bladder 02/28/2020  . Neuropathy 02/28/2020  . Urge incontinence of urine 02/28/2020  . Vitamin D deficiency disease 03/13/2019  . Rheumatoid arthritis ([place]) 03/13/2019  . Positive fecal occult blood test 12/12/2018  . Hemorrhoids 12/12/2018  . Rectal  bleeding 12/12/2018  . Vaginal irritation from pessary ([place]) 12/12/2018  . PMB (postmenopausal bleeding) 12/12/2018  . Uterovaginal prolapse, incomplete 07/04/2018  . Pessary maintenance 07/04/2018  . Abscess of right foot   . Diabetes mellitus type II, non insulin dependent ([place]) 05/03/2017  . Diabetic foot ulcer ([place]) 05/03/2017  . Cellulitis 05/03/2017  . Diabetic foot infection ([place])   . Chronic osteoarthritis 04/14/2017  . Episodic recurrent vertigo 04/14/2017  . Osteoporosis 04/14/2017  . Diabetic nephropathy ([place]) 03/24/2017  . Diabetic neuropathy associated with type 2 diabetes mellitus ([place]) 03/24/2017  . Cataracts, bilateral 03/24/2017  . History of bilateral breast cancer 03/24/2017  . Recurrent UTI 03/24/2017  . HLD (hyperlipidemia) 03/24/2017  . Diverticulosis 03/24/2017    Past Surgical History:  Procedure Laterality Date  . ABDOMINAL AORTOGRAM W/LOWER EXTREMITY N/A 04/13/2018   Procedure: ABDOMINAL AORTOGRAM W/LOWER EXTREMITY;  Surgeon: [name], MD;  Location: [facility];  Service: Cardiovascular;  Laterality: N/A;  . APPENDECTOMY  age 68  . BREAST LUMPECTOMY WITH AXILLARY LYMPH NODE DISSECTION Bilateral right 2000;   left 2002  . BUNIONECTOMY Bilateral 1968  . CARPAL TUNNEL RELEASE Right 2017  . CATARACT EXTRACTION W/PHACO Right 08/30/2017   Procedure: CATARACT EXTRACTION PHACO AND INTRAOCULAR LENS PLACEMENT RIGHT EYE;  Surgeon: [name], MD;  Location: [facility];  Service: Ophthalmology;  Laterality: Right;  CDE: 13.63  . CATARACT EXTRACTION W/PHACO Left 09/20/2017   Procedure: CATARACT EXTRACTION PHACO AND  INTRAOCULAR LENS PLACEMENT LEFT EYE;  Surgeon: [name], MD;  Location: [facility];  Service: Ophthalmology;  Laterality: Left;  CDE: 11.25  . D & C HYSTEROSCOPY W/ RESECTION POLYP  2017   per pt benign  . FOOT SURGERY     removal of two bones  . FRACTURE SURGERY Left 05/12/2016   patella fracture,   per pt no retatined hardware  . GRAFT  APPLICATION N/A 06/15/4268   Procedure: APPLICATION OF STRAVIX GRAFT;  Surgeon: [name], DPM;  Location: [place];  Service: Podiatry;  Laterality: N/A;  . HEMICOLECTOMY  2009   diverticulitis w/ perforation  . METATARSAL HEAD EXCISION N/A 04/18/2018   Procedure: FIRST METATARSAL HEAD RECESSION, WOUND DEBRIEDMENT;  Surgeon: [name], DPM;  Location: [place];  Service: Podiatry;  Laterality: N/A;  . NASAL SEPTOPLASTY W/ TURBINOPLASTY Bilateral 01/04/2018   Procedure: NASAL SEPTOPLASTY WITH BILATERAL TURBINATE REDUCTION;  Surgeon: [name], MD;  Location: [place];  Service: ENT;  Laterality: Bilateral;  . PERIPHERAL VASCULAR BALLOON ANGIOPLASTY  04/13/2018   Procedure: PERIPHERAL VASCULAR BALLOON ANGIOPLASTY;  Surgeon: [name], MD;  Location: [facility];  Service: Cardiovascular;;  Anterior Tibial  . SKIN GRAFT     left foot   . VENTRAL HERNIA REPAIR  01-07-2016    [place]   w/ mesh     OB History    Gravida  1   Para  1   Term  1   Preterm      AB      Living  1     SAB      IAB      Ectopic      Multiple      Live Births  1           Family History  Problem Relation Age of Onset  . Diabetes Mother   . Heart disease Mother   . Arthritis Mother   . Hypertension Mother   . Miscarriages / [place] Mother   . Early death Father   . Stroke Father   . Alcohol abuse Father   . Diabetes Sister   . Stroke Sister   . Diabetes Brother   . Diverticulitis Son   . Diabetes Sister   . Stroke Sister     Social History   Tobacco Use  . Smoking status: Former Smoker    Packs/day: 1.00    Years: 12.00    Pack years: 12.00    Types: Cigarettes    Start date: 05/11/1950    Quit date: 05/12/1963    Years since quitting: 57.1  . Smokeless tobacco: Never Used  Vaping Use  . Vaping Use: Never used  Substance Use Topics  . Alcohol use: No  . Drug use: No    Home  Medications Prior to Admission medications   Medication Sig Start Date End Date Taking? Authorizing Provider  aspirin EC 81 MG tablet Take 1 tablet (81 mg total) by mouth daily. 06/03/18   Kroeger, Lorelee Cover., PA-C  carboxymethylcellul-glycerin (OPTIVE) 0.5-0.9 % ophthalmic solution Apply to eye.    [provider]  Cholecalciferol 1.25 MG (50000 UT) TABS Take 2 tablets by mouth.    [provider]  fluocinonide gel (LIDEX) 6.23 % Apply 1 application topically as needed (mouth sores).  03/14/18   [provider]  folic acid (FOLVITE) 1 MG tablet Take 1 mg by mouth daily.    [provider]  gabapentin (NEURONTIN) 400 MG capsule TAKE 2 CAPSULES BY MOUTH 4 TIMES A DAY 06/04/20   Gosrani, Nimish C, MD  ipratropium (ATROVENT) 0.06 % nasal spray Place 2 sprays into both nostrils 2 (two) times daily. 01/29/20   [provider]  latanoprost (XALATAN) 0.005 % ophthalmic solution  02/21/20   [provider]  lidocaine (LMX) 4 % cream Apply 1 application topically as needed (pain).    [provider]  meclizine (ANTIVERT) 25 MG tablet Take 1 tablet (25 mg total) by mouth 3 (three) times daily as needed for dizziness. 03/21/20   Ailene Ards, NP  metFORMIN (GLUCOPHAGE) 500 MG tablet TAKE 1 TABLET BY MOUTH EVERY DAY 11/22/19   Hurshel Party C, MD  methotrexate (RHEUMATREX) 2.5 MG tablet Take 15 mg by mouth once a week. Caution:Chemotherapy. Protect from light.    [provider]  Multiple Vitamins-Minerals (CENTRUM SILVER PO) Take by mouth daily.    [provider]  Polyvinyl Alcohol-Povidone (REFRESH OP) Apply to eye 2 (two) times daily as needed.    [provider]  Turmeric 1053 MG TABS Take 600 mg by mouth. With black pepper extract    [provider]  vitamin B-12 (CYANOCOBALAMIN) 500 MCG tablet Take 500 mcg by mouth daily.    [provider]    Allergies    Phenergan [promethazine hcl],  Ciprofloxacin, and Levofloxacin  Review of Systems   Review of Systems  Constitutional: Negative for fever.  Gastrointestinal: Positive for abdominal pain, constipation and rectal pain. Negative for vomiting.  All other systems reviewed and are negative.   Physical Exam Updated Vital Signs BP 132/66   Pulse 77   Resp 14   SpO2 97%   Physical Exam Vitals and nursing note reviewed.  Constitutional:      General: She is not in acute distress.    Appearance: Normal appearance. She is well-developed and well-nourished.  HENT:     Head: Normocephalic and atraumatic.     Right Ear: Hearing normal.     Left Ear: Hearing normal.     Nose: Nose normal.     Mouth/Throat:     Mouth: Oropharynx is clear and moist and mucous membranes are normal.  Eyes:     Extraocular Movements: EOM normal.     Conjunctiva/sclera: Conjunctivae normal.     Pupils: Pupils are equal, round, and reactive to light.  Cardiovascular:     Rate and Rhythm: Regular rhythm.     Heart sounds: S1 normal and S2 normal. No murmur heard. No friction rub. No gallop.   Pulmonary:     Effort: Pulmonary effort is normal. No respiratory distress.     Breath sounds: Normal breath sounds.  Chest:     Chest wall: No tenderness.  Abdominal:     General: Bowel sounds are decreased.     Palpations: Abdomen is soft. There is no hepatosplenomegaly.     Tenderness: There is generalized abdominal tenderness. There is no guarding or rebound. Negative signs include Murphy's sign and McBurney's sign.     Hernia: No hernia is present.  Genitourinary:    Comments: Large amount of soft stool in rectum, no mass Musculoskeletal:        General: Normal range of motion.     Cervical back: Normal range of motion and neck supple.  Skin:    General: Skin is warm, dry and intact.     Findings: No rash.     Nails:  There is no cyanosis.  Neurological:     Mental Status: She is alert and oriented to person, place, and time.     GCS: GCS  eye subscore is 4. GCS verbal subscore is 5. GCS motor subscore is 6.     Cranial Nerves: No cranial nerve deficit.     Sensory: No sensory deficit.     Coordination: Coordination normal.     Deep Tendon Reflexes: Strength normal.  Psychiatric:        Mood and Affect: Mood and affect normal.        Speech: Speech normal.        Behavior: Behavior normal.        Thought Content: Thought content normal.     ED Results / Procedures / Treatments   Labs (all labs ordered are listed, but only abnormal results are displayed) Labs Reviewed  CBC WITH DIFFERENTIAL/PLATELET - Abnormal; Notable for the following components:      Result Value   Lymphs Abs 0.6 (*)    All other components within normal limits  COMPREHENSIVE METABOLIC PANEL - Abnormal; Notable for the following components:   Glucose, Bld 161 (*)    All other components within normal limits  LIPASE, BLOOD  LACTIC ACID, PLASMA  URINALYSIS, ROUTINE W REFLEX MICROSCOPIC    EKG None  Radiology CT ABDOMEN PELVIS W CONTRAST  Result Date: 06/24/2020 CLINICAL DATA:  Bladder surgery 06/20/2020 with abdominal and rectal pain. EXAM: CT ABDOMEN AND PELVIS WITH CONTRAST TECHNIQUE: Multidetector CT imaging of the abdomen and pelvis was performed using the standard protocol following bolus administration of intravenous contrast. CONTRAST:  179mL OMNIPAQUE IOHEXOL 300 MG/ML  SOLN COMPARISON:  None similar FINDINGS: Lower chest:  No acute finding. Hepatobiliary: Borderline hepatic steatosis.No evidence of biliary obstruction or stone. Pancreas: Unremarkable. Spleen: Unremarkable. Adrenals/Urinary Tract: Negative adrenals. No hydronephrosis or stone. Decompressed by a Foley catheter. Stomach/Bowel: Formed stool throughout the distal colon with more liquid stool distending the colon proximally. No Peri rectal inflammation is seen. No visible appendix. Colonic diverticulosis. Vascular/Lymphatic: Diffuse atheromatous calcification of the aorta and  branch vessels. No mass or adenopathy. Reproductive:Peroneal low-density correlating with recent surgery. No rim enhancing collection or soft tissue gas. Other: No ascites or pneumoperitoneum. Multiple fatty ventral abdominal wall hernias, the largest rightward of midline just above the umbilicus and measuring up to 5.3 cm. Musculoskeletal: Ordinary degenerative disease. IMPRESSION: 1. Perineal inflammation correlating with recent surgery. No fluid collection. 2. Stool or fluid distended colon diffusely, please correlate for stool retention. 3. Fatty ventral abdominal hernias. Electronically Signed   By: Monte Fantasia M.D.   On: 06/24/2020 06:48    Procedures Procedures   Medications Ordered in ED Medications  morphine 2 MG/ML injection 2 mg (has no administration in time range)  ondansetron (ZOFRAN) injection 4 mg (has no administration in time range)  magnesium citrate solution 1 Bottle (has no administration in time range)  iohexol (OMNIPAQUE) 300 MG/ML solution 100 mL (100 mLs Intravenous Contrast Given 06/24/20 [id number])    ED Course  I have reviewed the triage vital signs and the nursing notes.  Pertinent labs & imaging results that were available during my care of the patient were reviewed by me and considered in my medical decision making (see chart for details).    MDM Rules/Calculators/A&P                          Patient is 4 days postop experiencing constipation,  rectal pain and urge to defecate.  She has been using stool softeners without improvement.  Patient was referred to the emergency department by her surgeon at [facility].  I received a call and their concern was the possibility of hematoma or abscess after surgery.  Recommendation was CT scan to further evaluate.  No evidence of hematoma, abscess or other complication of the surgery seen.  She does have significant stool retention.  Will perform enema here in the emergency department and eventually discharge with continued bowel  care.  Follow-up with her surgeon.   Final Clinical Impression(s) / ED Diagnoses Final diagnoses:  Constipation, unspecified constipation type    Rx / DC Orders ED Discharge Orders    None       Annsley Akkerman, Gwenyth Allegra, MD 06/24/20 [phone]

## 2020-06-24 NOTE — ED Notes (Signed)
Care Everywhere Chart Review reveals:  Telephone Encounter - Billie Ruddy Usher, DO - 06/24/2020 4:07 AM EST Formatting of this note might be different from the original. Patient called PAL line, name and DOB confirmed. Patient had A&P repair with high perineorrhaphy on 2/10 and complains of severe rectal pain, difficulty ambulating and severe constipation. Rectal pain started Friday PM which was slightly relieved with ice and PO meds however has had worseing rectal pain since that time. Has been taking ibuprofen, tramadol with minimal relief. Endorses minimal vaginal bleeding. States she has taken Miralax, milk of magnesia, and another "stool softener." States she has severe pain with sitting and severe tenderness to palpation posteriorly Unable to see if there is any erythema. Voiding via foley catheter large amount clear yellow urine. Denies fevers/chills. Denies purulent drainage. Endorses flatus. Denies nausea/vomiting.   Differential includes constipation, abscess, perineal hematoma, inadequate analgesia. Given patient's severe pain despite PO medication and inability to defecate, recommended she get evaluated by local emergency department.   Plan of care discussed with Dr. Orvilla Fus FPMRS Fellow. Discussed patient's case with Local ED [facility]) physician Dr. Wilfrid Lund C. Usher DO Obstetrics and Gynecology PGY-3   Electronically signed by: Billie Ruddy Usher, DO Resident 06/24/20 [phone]

## 2020-06-24 NOTE — ED Notes (Signed)
Patient transported to CT 

## 2020-06-24 NOTE — ED Provider Notes (Signed)
Signout from Dr. Waverly Ferrari.  87 year old female 4 days postop pelvic surgery.  Here with constipation and rectal pain.  Labs and CT imaging consistent with constipation no obvious abscess.  Patient getting laxatives and enema here.  Plan is to reassess after and likely discharge on bowel regiment Physical Exam  BP 132/66   Pulse 77   Resp 14   SpO2 97%   Physical Exam  ED Course/Procedures     Procedures  MDM  10am - Patient continues to have significant rectal pain.  Soapsuds enema with some results.  Lactate elevated.  IV fluids ordered.  Morphine does not seem to be helping her.  Have ordered her some Dilaudid.  Husband asking if we can reach out to her surgeon.  1130 -pain much improved after the Dilaudid.  Husband states she has not really been taking the pain medicine at home.  They are comfortable going home currently.  Has had another bowel movement here.  Recommended taking the pain medication regularly and I will will also start her on a stool softener.  Recommended close follow-up with her gynecologist.  I left a message with gynecology office at 10 AM but still have not received any call back.  1:30 PM I received a call back from Dr. Zigmund Daniel.  Patient had already been discharged and I explained this to her.  She asked me to check with the patient and make sure she has some oxycodone as she is expecting her to have significant pain after this type of surgery.  I called the patient's husband and currently she is only taking tramadol.  I let them know that I am calling in a prescription for oxycodone and that she is not to take the tramadol with it.  He voiced understanding of these instructions.       Hayden Rasmussen, MD 06/24/20 1743

## 2020-06-24 NOTE — ED Notes (Signed)
Soap suds enema administered at this time. Patient tolerated well. No results at this time.

## 2020-06-24 NOTE — ED Triage Notes (Signed)
Pt had surgery on bladder on 06/20/2020 at [facility] and is having abdominal pain and rectal pain. Pt c/o constipation.

## 2020-06-25 ENCOUNTER — Telehealth: Payer: Self-pay

## 2020-06-25 NOTE — Telephone Encounter (Signed)
Transition Care Management Follow-up Telephone Call  Date of discharge and from where: 06/24/2020 from [facility]  How have you been since you were released from the hospital? Pt stated that she is feeling much better today.   Any questions or concerns? Yes  Items Reviewed:  Did the pt receive and understand the discharge instructions provided? Yes   Medications obtained and verified? Yes   Other? No   Any new allergies since your discharge? No   Dietary orders reviewed? Heart Healthy  Do you have support at home? Yes   Functional Questionnaire: (I = Independent and D = Dependent) ADLs: I  Bathing/Dressing- I  Meal Prep- I  Eating- I  Maintaining continence- I  Transferring/Ambulation- I  Managing Meds- I  Follow up appointments reviewed:   PCP Hospital f/u appt confirmed? Yes  Scheduled to see Jeralyn Ruths, NP on 07/10/2020 @ 11:00am.  [facility] f/u appt confirmed? Yes  Scheduled to see Maryland Pink, MD on 07/03/2020 @ 09:15am.  Are transportation arrangements needed? No  If their condition worsens, is the pt aware to call PCP or go to the Emergency Dept.? Yes Was the patient provided with contact information for the PCP's office or ED? Yes Was to pt encouraged to call back with questions or concerns? Yes

## 2020-07-05 DIAGNOSIS — D333 Benign neoplasm of cranial nerves: Secondary | ICD-10-CM | POA: Insufficient documentation

## 2020-07-10 ENCOUNTER — Telehealth (INDEPENDENT_AMBULATORY_CARE_PROVIDER_SITE_OTHER): Payer: Self-pay | Admitting: Nurse Practitioner

## 2020-07-10 ENCOUNTER — Ambulatory Visit (INDEPENDENT_AMBULATORY_CARE_PROVIDER_SITE_OTHER): Payer: Medicare HMO | Admitting: Nurse Practitioner

## 2020-07-10 ENCOUNTER — Other Ambulatory Visit: Payer: Self-pay

## 2020-07-10 ENCOUNTER — Encounter (INDEPENDENT_AMBULATORY_CARE_PROVIDER_SITE_OTHER): Payer: Self-pay | Admitting: Nurse Practitioner

## 2020-07-10 VITALS — BP 114/72 | HR 64 | Temp 97.3°F | Ht 60.5 in | Wt 170.2 lb

## 2020-07-10 DIAGNOSIS — Z1231 Encounter for screening mammogram for malignant neoplasm of breast: Secondary | ICD-10-CM | POA: Diagnosis not present

## 2020-07-10 DIAGNOSIS — Z0000 Encounter for general adult medical examination without abnormal findings: Secondary | ICD-10-CM

## 2020-07-10 DIAGNOSIS — Z853 Personal history of malignant neoplasm of breast: Secondary | ICD-10-CM

## 2020-07-10 DIAGNOSIS — G8929 Other chronic pain: Secondary | ICD-10-CM

## 2020-07-10 DIAGNOSIS — K5900 Constipation, unspecified: Secondary | ICD-10-CM

## 2020-07-10 DIAGNOSIS — M25512 Pain in left shoulder: Secondary | ICD-10-CM

## 2020-07-10 DIAGNOSIS — R42 Dizziness and giddiness: Secondary | ICD-10-CM

## 2020-07-10 DIAGNOSIS — Z13820 Encounter for screening for osteoporosis: Secondary | ICD-10-CM

## 2020-07-10 NOTE — Telephone Encounter (Signed)
Just FYI I have ordered mammogram, dexa scan, and referrals to PT, ortho, and GI for this patient today. She prefers Dr. Laural Golden for GI.

## 2020-07-10 NOTE — Patient Instructions (Signed)
  Jessica Russell , Thank you for taking time to come for your Medicare Wellness Visit. I appreciate your ongoing commitment to your health goals. Please review the following plan we discussed and let me know if I can assist you in the future.   These are the goals we discussed: Goals    . HEMOGLOBIN A1C < 7.0       This is a list of the screening recommended for you and due dates:  Health Maintenance  Topic Date Due  . Complete foot exam   05/26/2018  . Urine Protein Check  07/25/2020  . Hemoglobin A1C  08/06/2020  . Eye exam for diabetics  08/23/2020  . Tetanus Vaccine  11/06/2027  . Flu Shot  Completed  . DEXA scan (bone density measurement)  Completed  . COVID-19 Vaccine  Completed  . Pneumonia vaccines  Completed  . HPV Vaccine  Aged Out

## 2020-07-10 NOTE — Progress Notes (Signed)
Subjective:   Jessica Russell is a 87 y.o. female who presents for Medicare Annual (Subsequent) preventive examination.   Cardiac Risk Factors include: advanced age (>51men, >65 women);diabetes mellitus;hypertension     Objective:    Today's Vitals   07/10/20 1020 07/10/20 1048  BP: 114/72   Pulse: 64   Temp: (!) 97.3 F (36.3 C)   TempSrc: Temporal   SpO2: 98%   Weight: 170 lb 3.2 oz (77.2 kg)   Height: 5' 0.5" (1.537 m)   PainSc:  2    Body mass index is 32.69 kg/m.  Advanced Directives 07/10/2020 06/24/2020 06/27/2019 04/18/2018 04/13/2018 04/02/2018 01/04/2018  Does Patient Have a Medical Advance Directive? Yes No Yes Yes Yes No Yes  Type of Paramedic of [place];Living will - Living will [place];Living will [place];Living will - [place]  Does patient want to make changes to medical advance directive? No - Patient declined - No - Patient declined - No - Patient declined - No - Patient declined  Copy of [place] in Chart? - - - No - copy requested Yes - validated most recent copy scanned in chart (See row information) - Yes  Would patient like information on creating a medical advance directive? No - Patient declined No - Patient declined - - No - Patient declined No - Patient declined -    Current Medications (verified) Outpatient Encounter Medications as of 07/10/2020  Medication Sig  . acetaminophen (TYLENOL) 500 MG tablet Take by mouth.  [name] aspirin EC 81 MG tablet Take 1 tablet (81 mg total) by mouth daily.  . carboxymethylcellul-glycerin (OPTIVE) 0.5-0.9 % ophthalmic solution Apply to eye.  . Cholecalciferol 1.25 MG (50000 UT) TABS Take 2 tablets by mouth.  . Cholecalciferol 125 MCG (5000 UT) TABS Take by mouth.  . docusate sodium (COLACE) 100 MG capsule Take 1 capsule (100 mg total) by mouth 2 (two) times daily as needed for mild constipation.  . fluocinonide gel  (LIDEX) 7.85 % Apply 1 application topically as needed (mouth sores).   . folic acid (FOLVITE) 1 MG tablet Take 1 mg by mouth daily.  [name] gabapentin (NEURONTIN) 400 MG capsule TAKE 2 CAPSULES BY MOUTH 4 TIMES A DAY  . ipratropium (ATROVENT) 0.06 % nasal spray Place 2 sprays into both nostrils 2 (two) times daily.  [name] latanoprost (XALATAN) 0.005 % ophthalmic solution   . lidocaine (LMX) 4 % cream Apply 1 application topically as needed (pain).  . meclizine (ANTIVERT) 25 MG tablet Take 1 tablet (25 mg total) by mouth 3 (three) times daily as needed for dizziness.  . metFORMIN (GLUCOPHAGE) 500 MG tablet TAKE 1 TABLET BY MOUTH EVERY DAY  . methotrexate (RHEUMATREX) 2.5 MG tablet Take 15 mg by mouth once a week. Caution:Chemotherapy. Protect from light.  . Multiple Vitamins-Minerals (CENTRUM SILVER PO) Take by mouth daily.  . Omega-3 Fatty Acids (RA FISH OIL) 1000 MG CAPS Take by mouth.  . ondansetron (ZOFRAN ODT) 4 MG disintegrating tablet Take 1 tablet (4 mg total) by mouth every 8 (eight) hours as needed for nausea or vomiting.  [name] oxyCODONE-acetaminophen (PERCOCET/ROXICET) 5-325 MG tablet Take 1 tablet by mouth every 6 (six) hours as needed for severe pain.  . Polyvinyl Alcohol-Povidone (REFRESH OP) Apply to eye 2 (two) times daily as needed.  . Turmeric 1053 MG TABS Take 600 mg by mouth. With black pepper extract  . vitamin B-12 (CYANOCOBALAMIN) 500 MCG tablet  Take 500 mcg by mouth daily.  [name] ibuprofen (ADVIL) 600 MG tablet Take 600 mg by mouth every 6 (six) hours as needed. (Patient not taking: Reported on 07/10/2020)   No facility-administered encounter medications on file as of 07/10/2020.    Allergies (verified) Phenergan [promethazine hcl], Ciprofloxacin, and Levofloxacin   History: Past Medical History:  Diagnosis Date  . Diabetic nephropathy ([place])   . Foot ulcer, right, with fat layer exposed ([place])    non-healing  . History of bilateral breast cancer 04-14-2018 per pt no recurrence   dx  2000 w/ right breast cancer, s/p  lumpectomy with node dissection completed radiation/  dx 2002 w/ left breast cancer, s/p lumpectomy with node dissection,  completed radiation  . History of diverticulitis of colon 2009   s/p  partial colectomy for perforated diverticulitis  . History of external beam radiation therapy    2000 for right breast cancer;   2002  for left breast cancer  (per pt 36 treatment for each breast)  . HLD (hyperlipidemia) 03/24/2017  . Neuropathy   . OA (osteoarthritis)   . Osteoporosis 04/14/2017  . Peripheral arterial occlusive disease ([place])    04-13-2018   s/p  balloon angioplasty to the anterior tibial artery for severe stenosis right lower extremity (dr Fletcher Anon)  . Rheumatoid arthritis ([place]) 03/13/2019  . Type 2 diabetes mellitus ([place])   . Vitamin D deficiency disease 03/13/2019   Past Surgical History:  Procedure Laterality Date  . ABDOMINAL AORTOGRAM W/LOWER EXTREMITY N/A 04/13/2018   Procedure: ABDOMINAL AORTOGRAM W/LOWER EXTREMITY;  Surgeon: [name], MD;  Location: [facility];  Service: Cardiovascular;  Laterality: N/A;  . APPENDECTOMY  age 50  . BREAST LUMPECTOMY WITH AXILLARY LYMPH NODE DISSECTION Bilateral right 2000;   left 2002  . BUNIONECTOMY Bilateral 1968  . CARPAL TUNNEL RELEASE Right 2017  . CATARACT EXTRACTION W/PHACO Right 08/30/2017   Procedure: CATARACT EXTRACTION PHACO AND INTRAOCULAR LENS PLACEMENT RIGHT EYE;  Surgeon: [name], MD;  Location: [facility];  Service: Ophthalmology;  Laterality: Right;  CDE: 13.63  . CATARACT EXTRACTION W/PHACO Left 09/20/2017   Procedure: CATARACT EXTRACTION PHACO AND INTRAOCULAR LENS PLACEMENT LEFT EYE;  Surgeon: [name], MD;  Location: [facility];  Service: Ophthalmology;  Laterality: Left;  CDE: 11.25  . D & C HYSTEROSCOPY W/ RESECTION POLYP  2017   per pt benign  . FOOT SURGERY     removal of two bones  . FRACTURE SURGERY Left 05/12/2016   patella fracture,   per pt no retatined hardware  .  GRAFT APPLICATION N/A 17/08/9447   Procedure: APPLICATION OF STRAVIX GRAFT;  Surgeon: [name], DPM;  Location: [facility];  Service: Podiatry;  Laterality: N/A;  . HEMICOLECTOMY  2009   diverticulitis w/ perforation  . METATARSAL HEAD EXCISION N/A 04/18/2018   Procedure: FIRST METATARSAL HEAD RECESSION, WOUND DEBRIEDMENT;  Surgeon: [name], DPM;  Location: [facility];  Service: Podiatry;  Laterality: N/A;  . NASAL SEPTOPLASTY W/ TURBINOPLASTY Bilateral 01/04/2018   Procedure: NASAL SEPTOPLASTY WITH BILATERAL TURBINATE REDUCTION;  Surgeon: [name], MD;  Location: [facility];  Service: ENT;  Laterality: Bilateral;  . PERIPHERAL VASCULAR BALLOON ANGIOPLASTY  04/13/2018   Procedure: PERIPHERAL VASCULAR BALLOON ANGIOPLASTY;  Surgeon: [name], MD;  Location: [facility];  Service: Cardiovascular;;  Anterior Tibial  . SKIN GRAFT     left foot   . VENTRAL HERNIA REPAIR  01-07-2016  [name], [place]   w/ mesh   Family History  Problem Relation Age of Onset  . Diabetes Mother   . Heart disease Mother   . Arthritis Mother   . Hypertension Mother   . Miscarriages / [place] Mother   . Early death Father   . Stroke Father   . Alcohol abuse Father   . Diabetes Sister   . Stroke Sister   . Diabetes Brother   . Diverticulitis Son   . Diabetes Sister   . Stroke Sister    Social History   Socioeconomic History  . Marital status: Married    Spouse name: Not on file  . Number of children: 1  . Years of education: 14  . Highest education level: Not on file  Occupational History  . Occupation: retired  Tobacco Use  . Smoking status: Former Smoker    Packs/day: 1.00    Years: 12.00    Pack years: 12.00    Types: Cigarettes    Start date: 05/11/1950    Quit date: 05/12/1963    Years since quitting: 57.2  . Smokeless tobacco: Never Used  Vaping Use  . Vaping Use: Never used  Substance and Sexual Activity  .  Alcohol use: No  . Drug use: No  . Sexual activity: Not Currently    Birth control/protection: Post-menopausal  Other Topics Concern  . Not on file  Social History Narrative   Lives with husband [name]   One son [name] lives in [place]    Father was a [place] from [place], patient is bilingual   Married for over 60 years.    Caffeine use: 1 cup per day   Right handed   Social Determinants of Health   Financial Resource Strain: Not on file  Food Insecurity: Not on file  Transportation Needs: Not on file  Physical Activity: Not on file  Stress: Not on file  Social Connections: Not on file    Tobacco Counseling Counseling given: Yes   Clinical Intake:  Pre-visit preparation completed: Yes  Pain : 0-10 Pain Score: 2  Pain Type: Neuropathic pain Pain Location:  (Bilateral hands and feet) Pain Descriptors / Indicators: Numbness Pain Onset: More than a month ago Pain Frequency: Constant     BMI - recorded: 32.69 Nutritional Status: BMI > 30  Obese Nutritional Risks: None Diabetes: Yes CBG done?: Yes (129) CBG resulted in Enter/ Edit results?: No Did pt. bring in CBG monitor from home?: No  How often do you need to have someone help you when you read instructions, pamphlets, or other written materials from your doctor or pharmacy?: 1 - Never What is the last grade level you completed in school?: 12th grade  Diabetic? Yes  Interpreter Needed?: No  Information entered by :: Jeralyn Ruths, NP-C   Activities of Daily Living In your present state of health, do you have any difficulty performing the following activities: 07/10/2020  Hearing? Y  Vision? N  Difficulty concentrating or making decisions? Y  Walking or climbing stairs? Y  Dressing or bathing? N  Doing errands, shopping? Y  Preparing Food and eating ? N  Using the Toilet? N  In the past six months, have you accidently leaked urine? Y  Do you have problems with loss of bowel control? N  Managing your  Medications? N  Managing your Finances? N  Housekeeping or managing your Housekeeping? N  Some recent data might be hidden    Patient Care Team: Doree Albee,  MD as PCP - General (Internal Medicine) [name], MD as PCP - Cardiology (Cardiology)  Indicate any recent Medical Services you may have received from other than [facility] providers in the past year (date may be approximate).     Assessment:   This is a routine wellness examination for [place].  Hearing/Vision screen  Hearing Screening   125Hz  250Hz  500Hz  1000Hz  2000Hz  3000Hz  4000Hz  6000Hz  8000Hz   Right ear:           Left ear:             Visual Acuity Screening   Right eye Left eye Both eyes  Without correction: 20/20 20/30 20/20   With correction:       Dietary issues and exercise activities discussed: Current Exercise Habits: Home exercise routine, Type of exercise: Other - see comments (Pedal machine), Time (Minutes): 40, Frequency (Times/Week): 1, Weekly Exercise (Minutes/Week): 40, Intensity: Mild, Exercise limited by: orthopedic condition(s);neurologic condition(s)  Goals    . HEMOGLOBIN A1C < 7.0      Depression Screen PHQ 2/9 Scores 07/10/2020 07/26/2019 06/27/2019 06/27/2019 11/05/2017 10/12/2017 03/24/2017  PHQ - 2 Score 0 0 1 0 0 0 0  PHQ- 9 Score 0 - - - - - -  Exception Documentation - Medical reason - Medical reason - - -    Fall Risk Fall Risk  07/10/2020 07/26/2019 06/27/2019 06/27/2019 12/12/2018  Falls in the past year? 0 0 0 0 0  Number falls in past yr: - 0 0 0 0  Injury with Fall? - 0 0 0 0  Risk for fall due to : - No Fall Risks Impaired balance/gait - -  Follow up - Falls evaluation completed Falls prevention discussed;Falls evaluation completed - -    FALL RISK PREVENTION PERTAINING TO THE HOME:  Any stairs in or around the home? No  If so, are there any without handrails? No  Home free of loose throw rugs in walkways, pet beds, electrical cords, etc? No  Adequate lighting in your home  to reduce risk of falls? Yes   ASSISTIVE DEVICES UTILIZED TO PREVENT FALLS:  Life alert? Yes  Use of a cane, walker or w/c? Yes  Grab bars in the bathroom? Yes  Shower chair or bench in shower? Yes  Elevated toilet seat or a handicapped toilet? No   TIMED UP AND GO:  Was the test performed? Yes .  Length of time to ambulate 10 feet: 19 sec.   Gait slow and steady without use of assistive device  Cognitive Function:     6CIT Screen 07/10/2020 06/27/2019  What Year? 0 points 0 points  What month? 0 points 0 points  What time? 0 points 0 points  Count back from 20 0 points 0 points  Months in reverse 0 points 0 points  Repeat phrase 2 points 0 points  Total Score 2 0    Immunizations Immunization History  Administered Date(s) Administered  . Fluad Quad(high Dose 65+) 02/07/2020  . Influenza, High Dose Seasonal PF 02/18/2017  . Influenza,inj,Quad PF,6+ Mos 01/19/2018  . Influenza-Unspecified 03/11/2017, 03/12/2019  . Moderna Sars-Covid-2 Vaccination 07/20/2019, 08/23/2019, 03/02/2020  . Pneumococcal Conjugate-13 04/14/2013  . Pneumococcal Polysaccharide-23 04/20/2005, 01/19/2018  . Td 11/05/2017  . Zoster 07/17/2010  . Zoster Recombinat (Shingrix) 03/28/2019    TDAP status: Up to date  Flu Vaccine status: Up to date  Pneumococcal vaccine status: Up to date  Covid-19 vaccine status: Completed vaccines  Qualifies for Shingles Vaccine? No   Zostavax  completed Yes   Shingrix Completed?: Yes  Screening Tests Health Maintenance  Topic Date Due  . FOOT EXAM  05/26/2018  . URINE MICROALBUMIN  07/25/2020  . HEMOGLOBIN A1C  08/06/2020  . OPHTHALMOLOGY EXAM  08/23/2020  . TETANUS/TDAP  11/06/2027  . INFLUENZA VACCINE  Completed  . DEXA SCAN  Completed  . COVID-19 Vaccine  Completed  . PNA vac Low Risk Adult  Completed  . HPV VACCINES  Aged Out    Health Maintenance  Health Maintenance Due  Topic Date Due  . FOOT EXAM  05/26/2018  . URINE MICROALBUMIN   07/25/2020    Colorectal cancer screening: No longer required.   Mammogram status: Ordered 07/10/20. Pt provided with contact info and advised to call to schedule appt.   Bone Density status: Ordered 07/10/20. Pt provided with contact info and advised to call to schedule appt.  Lung Cancer Screening: (Low Dose CT Chest recommended if Age 73-80 years, 30 pack-year currently smoking OR have quit w/in 15years.) does not qualify.   Lung Cancer Screening Referral: N/A  Additional Screening:  Hepatitis C Screening: does qualify; Completed: Patient declines to have screening completed today Vision Screening: Recommended annual ophthalmology exams for early detection of glaucoma and other disorders of the eye. Is the patient up to date with their annual eye exam?  Yes  Who is the provider or what is the name of the office in which the patient attends annual eye exams? [facility] If pt is not established with a provider, would they like to be referred to a provider to establish care? No .   Dental Screening: Recommended annual dental exams for proper oral hygiene  Community Resource Referral / Chronic Care Management: CRR required this visit?  No   CCM required this visit?  No      Plan:   Per patient request will order referral to physical therapy for assistance with vestibular dysfunction, orthopedic surgery for assistance with managing her chronic left shoulder pain, and gastroenterology for assistance with managing her constipation and diarrhea. She does have a history of bilateral breast cancer and was told by her previous cancer doctor to have a mammogram every year but she has stopped doing this over the last few years so we will order this today for further evaluation. We'll also order DEXA scan to test for osteoporosis.  I have personally reviewed and noted the following in the patient's chart:   . Medical and social history . Use of alcohol, tobacco or illicit drugs  . Current  medications and supplements . Functional ability and status . Nutritional status . Physical activity . Advanced directives . List of other physicians . Hospitalizations, surgeries, and ER visits in previous 12 months . Vitals . Screenings to include cognitive, depression, and falls . Referrals and appointments  In addition, I have reviewed and discussed with patient certain preventive protocols, quality metrics, and best practice recommendations. A written personalized care plan for preventive services as well as general preventive health recommendations were provided to patient.    She will follow-up in 3 months or sooner as needed.  Ailene Ards, NP   07/10/2020

## 2020-07-16 ENCOUNTER — Ambulatory Visit: Payer: 59 | Admitting: Orthopaedic Surgery

## 2020-07-16 ENCOUNTER — Encounter: Payer: Self-pay | Admitting: Internal Medicine

## 2020-07-16 NOTE — Telephone Encounter (Signed)
Updated GI referral to Dr Laural Golden, PT  is in [place] office local March 16 [place]

## 2020-07-17 ENCOUNTER — Other Ambulatory Visit (INDEPENDENT_AMBULATORY_CARE_PROVIDER_SITE_OTHER): Payer: Self-pay | Admitting: Internal Medicine

## 2020-07-24 ENCOUNTER — Encounter (HOSPITAL_COMMUNITY): Payer: Self-pay

## 2020-07-24 ENCOUNTER — Ambulatory Visit (HOSPITAL_COMMUNITY)
Admission: RE | Admit: 2020-07-24 | Discharge: 2020-07-24 | Disposition: A | Discharge status: Home / Self Care | Payer: Medicare HMO | Source: Ambulatory Visit | Attending: Nurse Practitioner | Admitting: Nurse Practitioner

## 2020-07-24 ENCOUNTER — Ambulatory Visit: Payer: Medicare HMO

## 2020-07-24 ENCOUNTER — Ambulatory Visit (INDEPENDENT_AMBULATORY_CARE_PROVIDER_SITE_OTHER): Payer: Medicare HMO | Admitting: Orthopedic Surgery

## 2020-07-24 ENCOUNTER — Other Ambulatory Visit: Payer: Self-pay

## 2020-07-24 ENCOUNTER — Encounter: Payer: Self-pay | Admitting: Orthopedic Surgery

## 2020-07-24 VITALS — BP 149/83 | HR 78 | Ht 62.0 in | Wt 172.0 lb

## 2020-07-24 DIAGNOSIS — Z780 Asymptomatic menopausal state: Secondary | ICD-10-CM | POA: Diagnosis not present

## 2020-07-24 DIAGNOSIS — Z13820 Encounter for screening for osteoporosis: Secondary | ICD-10-CM | POA: Diagnosis not present

## 2020-07-24 DIAGNOSIS — M8589 Other specified disorders of bone density and structure, multiple sites: Secondary | ICD-10-CM | POA: Diagnosis not present

## 2020-07-24 DIAGNOSIS — S46012A Strain of muscle(s) and tendon(s) of the rotator cuff of left shoulder, initial encounter: Secondary | ICD-10-CM | POA: Diagnosis not present

## 2020-07-24 DIAGNOSIS — G8929 Other chronic pain: Secondary | ICD-10-CM

## 2020-07-24 DIAGNOSIS — Z1231 Encounter for screening mammogram for malignant neoplasm of breast: Secondary | ICD-10-CM | POA: Insufficient documentation

## 2020-07-24 DIAGNOSIS — Z853 Personal history of malignant neoplasm of breast: Secondary | ICD-10-CM | POA: Insufficient documentation

## 2020-07-24 DIAGNOSIS — M25512 Pain in left shoulder: Secondary | ICD-10-CM

## 2020-07-24 HISTORY — DX: Personal history of irradiation: Z92.3

## 2020-07-25 ENCOUNTER — Encounter: Payer: Self-pay | Admitting: Orthopedic Surgery

## 2020-07-25 NOTE — Progress Notes (Signed)
New Patient Visit  Assessment: Jessica Russell is a 87 y.o. female with the following: Left shoulder rotator cuff tendinitis   Plan: Jessica Russell has left shoulder pain without a specific injury.  We reviewed radiographs in clinic which do not demonstrate an acute injury.  She has minimal degenerative changes.  However, on physical exam, her rotator cuff is clearly irritated.  She has pretty good strength and range of motion, but notes significant discomfort, primarily in the posterior and lateral aspect of her shoulder with physical exam testing.  We discussed proceeding with a steroid injection, which I do think would be beneficial for her.  However, she states she is undergoing that procedure to remove a vestibular schwannoma within the next month, and like to minimize her risk for complication.  I think this is reasonable.  Once she has fully recovered from her procedure, she can return to clinic for a steroid injection, if her shoulder still bothering her.  She can continue with medications as needed until that time.  We will not schedule follow-up appointment, but she can contact the clinic at any time.  Follow-up: Return if symptoms worsen or fail to improve.  Subjective:  Chief Complaint  Patient presents with  . Shoulder Pain    Left shoulder,     History of Present Illness: Jessica Russell is a 87 y.o. RHD female who has been referred to clinic today by Jeralyn Ruths, NP for evaluation of her left shoulder pain.  She denies a specific injury.  She is noted pain in the left shoulder for several months.  She has been taking Tylenol and ibuprofen as needed.  This helps with the pain some, but is not sustained relief.  No specific exercises for her shoulder.  She notes difficulty with overhead motion, but has adapted.  She will rely more on her right hand when lifting items above her shoulder level.  Specifically, she has some difficulty with removing dishes and pots and pans from  higher cabinets.  Of note, she has diabetic neuropathy in all 4 extremities.  She is also had surgery for breast cancer in the left side.   Review of Systems: No fevers or chills + numbness & tingling in peripheral extremities.  No chest pain No shortness of breath No bowel or bladder dysfunction No GI distress No headaches   Medical History:  Past Medical History:  Diagnosis Date  . Diabetic nephropathy ([place])   . Foot ulcer, right, with fat layer exposed ([place])    non-healing  . History of bilateral breast cancer 04-14-2018 per pt no recurrence   dx 2000 w/ right breast cancer, s/p  lumpectomy with node dissection completed radiation/  dx 2002 w/ left breast cancer, s/p lumpectomy with node dissection,  completed radiation  . History of diverticulitis of colon 2009   s/p  partial colectomy for perforated diverticulitis  . History of external beam radiation therapy    2000 for right breast cancer;   2002  for left breast cancer  (per pt 36 treatment for each breast)  . HLD (hyperlipidemia) 03/24/2017  . Neuropathy   . OA (osteoarthritis)   . Osteoporosis 04/14/2017  . Peripheral arterial occlusive disease ([place])    04-13-2018   s/p  balloon angioplasty to the anterior tibial artery for severe stenosis right lower extremity (dr Fletcher Anon)  . Personal history of radiation therapy    both breasts  . Rheumatoid arthritis ([place]) 03/13/2019  . Type 2 diabetes mellitus ([place])   .  Vitamin D deficiency disease 03/13/2019    Past Surgical History:  Procedure Laterality Date  . ABDOMINAL AORTOGRAM W/LOWER EXTREMITY N/A 04/13/2018   Procedure: ABDOMINAL AORTOGRAM W/LOWER EXTREMITY;  Surgeon: [name], MD;  Location: [place] [facility];  Service: Cardiovascular;  Laterality: N/A;  . APPENDECTOMY  age 13  . BREAST LUMPECTOMY Right 2000  . BREAST LUMPECTOMY Left 2002  . BREAST LUMPECTOMY WITH AXILLARY LYMPH NODE DISSECTION Bilateral right 2000;   left 2002  . BUNIONECTOMY Bilateral 1968   . CARPAL TUNNEL RELEASE Right 2017  . CATARACT EXTRACTION W/PHACO Right 08/30/2017   Procedure: CATARACT EXTRACTION PHACO AND INTRAOCULAR LENS PLACEMENT RIGHT EYE;  Surgeon: [name], MD;  [facility];  Service: Ophthalmology;  Laterality: Right;  CDE: 13.63  . CATARACT EXTRACTION W/PHACO Left 09/20/2017   Procedure: CATARACT EXTRACTION PHACO AND INTRAOCULAR LENS PLACEMENT LEFT EYE;  Surgeon: [name], MD;  [facility];  Service: Ophthalmology;  Laterality: Left;  CDE: 11.25  . D & C HYSTEROSCOPY W/ RESECTION POLYP  2017   per pt benign  . FOOT SURGERY     removal of two bones  . FRACTURE SURGERY Left 05/12/2016   patella fracture,   per pt no retatined hardware  . GRAFT APPLICATION N/A 85/08/6268   Procedure: APPLICATION OF STRAVIX GRAFT;  Surgeon: [name], DPM;  Location: [place];  Service: Podiatry;  Laterality: N/A;  . HEMICOLECTOMY  2009   diverticulitis w/ perforation  . METATARSAL HEAD EXCISION N/A 04/18/2018   Procedure: FIRST METATARSAL HEAD RECESSION, WOUND DEBRIEDMENT;  Surgeon: [name], DPM;  Location: [place];  Service: Podiatry;  Laterality: N/A;  . NASAL SEPTOPLASTY W/ TURBINOPLASTY Bilateral 01/04/2018   Procedure: NASAL SEPTOPLASTY WITH BILATERAL TURBINATE REDUCTION;  Surgeon: [name], MD;  Location: [place];  Service: ENT;  Laterality: Bilateral;  . PERIPHERAL VASCULAR BALLOON ANGIOPLASTY  04/13/2018   Procedure: PERIPHERAL VASCULAR BALLOON ANGIOPLASTY;  Surgeon: [name], MD;  Location: [place] [facility];  Service: Cardiovascular;;  Anterior Tibial  . SKIN GRAFT     left foot   . VENTRAL HERNIA REPAIR  01-07-2016    [name], [place]   w/ mesh    Family History  Problem Relation Age of Onset  . Diabetes Mother   . Heart disease Mother   . Arthritis Mother   . Hypertension Mother   . Miscarriages / [place] Mother   . Early death Father   . Stroke Father   . Alcohol abuse  Father   . Diabetes Sister   . Stroke Sister   . Diabetes Brother   . Diverticulitis Son   . Diabetes Sister   . Stroke Sister    Social History   Tobacco Use  . Smoking status: Former Smoker    Packs/day: 1.00    Years: 12.00    Pack years: 12.00    Types: Cigarettes    Start date: 05/11/1950    Quit date: 05/12/1963    Years since quitting: 57.2  . Smokeless tobacco: Never Used  Vaping Use  . Vaping Use: Never used  Substance Use Topics  . Alcohol use: No  . Drug use: No    Allergies  Allergen Reactions  . Phenergan [Promethazine Hcl] Other (See Comments)    DELIRIUM (HALLUCINATIONS)  . Ciprofloxacin Swelling  . Levofloxacin Swelling    Current Meds  Medication Sig  . acetaminophen (TYLENOL) 500 MG tablet Take by mouth.  [name]  aspirin EC 81 MG tablet Take 1 tablet (81 mg total) by mouth daily.  . carboxymethylcellul-glycerin (OPTIVE) 0.5-0.9 % ophthalmic solution Apply to eye.  . Cholecalciferol 1.25 MG (50000 UT) TABS Take 2 tablets by mouth.  . Cholecalciferol 125 MCG (5000 UT) TABS Take by mouth.  . docusate sodium (COLACE) 100 MG capsule Take 1 capsule (100 mg total) by mouth 2 (two) times daily as needed for mild constipation.  . fluocinonide gel (LIDEX) 7.90 % Apply 1 application topically as needed (mouth sores).   . folic acid (FOLVITE) 1 MG tablet Take 1 mg by mouth daily.  [name] gabapentin (NEURONTIN) 400 MG capsule TAKE 2 CAPSULES BY MOUTH 4 TIMES A DAY  . ibuprofen (ADVIL) 600 MG tablet Take 600 mg by mouth every 6 (six) hours as needed.  [name] ipratropium (ATROVENT) 0.06 % nasal spray Place 2 sprays into both nostrils 2 (two) times daily.  [name] latanoprost (XALATAN) 0.005 % ophthalmic solution   . lidocaine (LMX) 4 % cream Apply 1 application topically as needed (pain).  . meclizine (ANTIVERT) 25 MG tablet Take 1 tablet (25 mg total) by mouth 3 (three) times daily as needed for dizziness.  . metFORMIN (GLUCOPHAGE) 500 MG tablet TAKE 1 TABLET BY MOUTH EVERY DAY  .  methotrexate (RHEUMATREX) 2.5 MG tablet Take 15 mg by mouth once a week. Caution:Chemotherapy. Protect from light.  . Multiple Vitamins-Minerals (CENTRUM SILVER PO) Take by mouth daily.  . Omega-3 Fatty Acids (RA FISH OIL) 1000 MG CAPS Take by mouth.  . ondansetron (ZOFRAN ODT) 4 MG disintegrating tablet Take 1 tablet (4 mg total) by mouth every 8 (eight) hours as needed for nausea or vomiting.  [name] oxyCODONE-acetaminophen (PERCOCET/ROXICET) 5-325 MG tablet Take 1 tablet by mouth every 6 (six) hours as needed for severe pain.  . Polyvinyl Alcohol-Povidone (REFRESH OP) Apply to eye 2 (two) times daily as needed.  . Turmeric 1053 MG TABS Take 600 mg by mouth. With black pepper extract  . vitamin B-12 (CYANOCOBALAMIN) 500 MCG tablet Take 500 mcg by mouth daily.    Objective: BP (!) 149/83   Pulse 78   Ht 5\' 2"  (1.575 m)   Wt 172 lb (78 kg)   BMI 31.46 kg/m   Physical Exam:  General: Elderly female, in no acute distress.  Alert and oriented. Gait: Ambulates with the assistance of a cane.  Evaluation of the left upper extremity demonstrates no deformity.  No atrophy is appreciated.  Forward flexion to 130 degrees with some discomfort.  Abduction to 100 degrees, once again with some discomfort.  Pain in the empty can position.  Supraspinatus is 5/5.  5/5 infraspinatus testing, with discomfort in the posterior and lateral aspect of her shoulder.  Negative belly press.  Diffuse decreased sensation throughout the hand.  Pain in the posterior shoulder with speeds and O'Brien's testing.    IMAGING: I personally ordered and reviewed the following images   X-rays of the left shoulder obtained in clinic today and demonstrates no acute injury.  The glenohumeral joint is well reduced.  There is no proximal humeral migration.  Changes noted within the glenohumeral joint, as well as the [facility] joint.  Impression: Normal left shoulder x-ray, minimal degenerative changes   New Medications:  No orders of  the defined types were placed in this encounter.     Mordecai Rasmussen, MD  07/25/2020 8:59 AM

## 2020-08-01 ENCOUNTER — Telehealth: Payer: Self-pay | Admitting: Orthopedic Surgery

## 2020-08-01 DIAGNOSIS — S46012A Strain of muscle(s) and tendon(s) of the rotator cuff of left shoulder, initial encounter: Secondary | ICD-10-CM

## 2020-08-01 NOTE — Telephone Encounter (Signed)
Dr. Amedeo Kinsman,     I didn't see anything mentioned about pt going to PT/OT for her shoulder. Will it be ok for the pt to have an order placed for PT/OT of her shoulder at this time?

## 2020-08-01 NOTE — Telephone Encounter (Signed)
Patient had called, asking if she may have physical therapy order for left shoulder to be done at [facility] in [place], rather than [facility], as she is already having therapy at [facility] for vertigo; states it would be much more convenient.* *Also received a voice message from [facility] at [place] (ph [phone]/QFJ [phone]) - asking status of order.

## 2020-08-01 NOTE — Telephone Encounter (Signed)
Yes, if she wants to go to PT, that would be great.   Send a referral wherever she wants to go.  Appreciate the help  [employer]

## 2020-08-05 ENCOUNTER — Other Ambulatory Visit (HOSPITAL_COMMUNITY): Payer: Self-pay | Admitting: Internal Medicine

## 2020-08-05 ENCOUNTER — Inpatient Hospital Stay
Admission: RE | Admit: 2020-08-05 | Discharge: 2020-08-05 | Disposition: A | Discharge status: Home / Self Care | Payer: Self-pay | Source: Ambulatory Visit | Attending: Internal Medicine | Admitting: Internal Medicine

## 2020-08-05 ENCOUNTER — Telehealth: Payer: Self-pay | Admitting: Orthopedic Surgery

## 2020-08-05 DIAGNOSIS — Z1231 Encounter for screening mammogram for malignant neoplasm of breast: Secondary | ICD-10-CM

## 2020-08-05 NOTE — Telephone Encounter (Addendum)
Beth from [employer] called and said she it needs order to say PT instead of OT.  Can you send new order to her?  Thanks

## 2020-08-08 NOTE — Addendum Note (Signed)
Addended by: Elizabeth Sauer on: 08/08/2020 11:56 AM   Modules accepted: Orders

## 2020-08-08 NOTE — Telephone Encounter (Signed)
Referral order changed and faxed.

## 2020-08-26 ENCOUNTER — Encounter: Payer: Self-pay | Admitting: Gastroenterology

## 2020-08-26 NOTE — Progress Notes (Signed)
Referring Provider: Ailene Ards, NP Primary Care Physician:  Doree Albee, MD Primary Gastroenterologist:  Dr. Gala Romney  Chief Complaint  Patient presents with  . Constipation    Occ loose stool. Sometimes loose after eating. Some better since started taking Citrucel    HPI:   Jessica Russell is a 87 y.o. female presenting today at the request of Ailene Ards, NP for constipation.  Reports she had been having a hard time with constipation. Started Citrucel 1 tablespoon nightly and has had significant improvement. Usually with 1-2 BMs daily. Formed and typically soft. Had an urgent mushy stool yesterday afternoon. This happens every now and then. This is not frequent. BMs can be within 15 minutes of eating, especially if eating out. This has been going on for a while. Worse with fattier foods. Slight cramping in her abdomen at times like she knows she needs to go. When constipated, cramping was worse.   No blood in the stool. Has history of hemorrhoids since child birth. She had low volume toilet tissue hematochezia when she was having to strain a lot, but this has also resolved.   No melena. No nausea, vomiting, acid reflux, or dysphagia. Weight has been fairly stable.   Colonoscopy in 2004 in [place]. Can't remember the name of the practice or doctor.  No polyps.   Past Medical History:  Diagnosis Date  . Diabetic nephropathy ([place])   . Foot ulcer, right, with fat layer exposed ([place])    non-healing  . History of bilateral breast cancer 04-14-2018 per pt no recurrence   dx 2000 w/ right breast cancer, s/p  lumpectomy with node dissection completed radiation/  dx 2002 w/ left breast cancer, s/p lumpectomy with node dissection,  completed radiation  . History of diverticulitis of colon 2009   s/p  partial colectomy for perforated diverticulitis  . History of external beam radiation therapy    2000 for right breast cancer;   2002  for left breast cancer  (per pt 36 treatment  for each breast)  . HLD (hyperlipidemia) 03/24/2017  . Neuropathy   . OA (osteoarthritis)   . Osteoporosis 04/14/2017  . Peripheral arterial occlusive disease ([place])    04-13-2018   s/p  balloon angioplasty to the anterior tibial artery for severe stenosis right lower extremity (dr Fletcher Anon)  . Personal history of radiation therapy    both breasts  . Rheumatoid arthritis ([place]) 03/13/2019  . Type 2 diabetes mellitus ([place])   . Vestibular schwannoma [facility])     S/p Gamma Knife stereotactic radiosurgery April 2022  . Vitamin D deficiency disease 03/13/2019    Past Surgical History:  Procedure Laterality Date  . ABDOMINAL AORTOGRAM W/LOWER EXTREMITY N/A 04/13/2018   Procedure: ABDOMINAL AORTOGRAM W/LOWER EXTREMITY;  Surgeon: [name], MD;  Location: [place] [facility];  Service: Cardiovascular;  Laterality: N/A;  . APPENDECTOMY  age 58  . BREAST LUMPECTOMY Right 2000  . BREAST LUMPECTOMY Left 2002  . BREAST LUMPECTOMY WITH AXILLARY LYMPH NODE DISSECTION Bilateral right 2000;   left 2002  . BUNIONECTOMY Bilateral 1968  . CARPAL TUNNEL RELEASE Right 2017  . CATARACT EXTRACTION W/PHACO Right 08/30/2017   Procedure: CATARACT EXTRACTION PHACO AND INTRAOCULAR LENS PLACEMENT RIGHT EYE;  Surgeon: [name], MD;  [facility];  Service: Ophthalmology;  Laterality: Right;  CDE: 13.63  . CATARACT EXTRACTION W/PHACO Left 09/20/2017   Procedure: CATARACT EXTRACTION PHACO AND INTRAOCULAR LENS PLACEMENT LEFT EYE;  Surgeon: [name], MD;  [facility];  Service: Ophthalmology;  Laterality: Left;  CDE: 11.25  . D & C HYSTEROSCOPY W/ RESECTION POLYP  2017   per pt benign  . FOOT SURGERY     removal of two bones  . FRACTURE SURGERY Left 05/12/2016   patella fracture,   per pt no retatined hardware  . GRAFT APPLICATION N/A 87/09/6431   Procedure: APPLICATION OF STRAVIX GRAFT;  Surgeon: [name], DPM;  Location: [place];  Service: Podiatry;  Laterality: N/A;  .  HEMICOLECTOMY  2009   diverticulitis w/ perforation  . METATARSAL HEAD EXCISION N/A 04/18/2018   Procedure: FIRST METATARSAL HEAD RECESSION, WOUND DEBRIEDMENT;  Surgeon: [name], DPM;  Location: [place];  Service: Podiatry;  Laterality: N/A;  . NASAL SEPTOPLASTY W/ TURBINOPLASTY Bilateral 01/04/2018   Procedure: NASAL SEPTOPLASTY WITH BILATERAL TURBINATE REDUCTION;  Surgeon: [name], MD;  Location: [place];  Service: ENT;  Laterality: Bilateral;  . PERIPHERAL VASCULAR BALLOON ANGIOPLASTY  04/13/2018   Procedure: PERIPHERAL VASCULAR BALLOON ANGIOPLASTY;  Surgeon: [name], MD;  Location: [place] [facility];  Service: Cardiovascular;;  Anterior Tibial  . SKIN GRAFT     left foot   . VENTRAL HERNIA REPAIR  01-07-2016    [name], [place]   w/ mesh    Current Outpatient Medications  Medication Sig Dispense Refill  . acetaminophen (TYLENOL) 500 MG tablet Take 1,500 mg by mouth at bedtime.    [name] aspirin EC 81 MG tablet Take 1 tablet (81 mg total) by mouth daily. 90 tablet 3  . CALCIUM-MAGNESIUM PO Take by mouth daily.    . carboxymethylcellul-glycerin (OPTIVE) 0.5-0.9 % ophthalmic solution Apply to eye.    . Cholecalciferol 125 MCG (5000 UT) TABS Take 1 tablet by mouth daily.    . fluocinonide gel (LIDEX) 2.95 % Apply 1 application topically as needed (mouth sores).   0  . folic acid (FOLVITE) 1 MG tablet Take 1 mg by mouth daily.    [name] gabapentin (NEURONTIN) 400 MG capsule TAKE 2 CAPSULES BY MOUTH 4 TIMES A DAY 240 capsule 2  . latanoprost (XALATAN) 0.005 % ophthalmic solution     . Menaquinone-7 (VITAMIN K2 PO) Take by mouth daily.    . metFORMIN (GLUCOPHAGE) 500 MG tablet TAKE 1 TABLET BY MOUTH EVERY DAY 90 tablet 1  . methotrexate (RHEUMATREX) 2.5 MG tablet Take 15 mg by mouth once a week. Caution:Chemotherapy. Protect from light.    . Methylcellulose, Laxative, (CITRUCEL PO) Take by mouth at bedtime. 1 tablespoon in 3 oz water    . Multiple  Vitamins-Minerals (CENTRUM SILVER PO) Take by mouth daily.    . Omega-3 Fatty Acids (RA FISH OIL) 1000 MG CAPS Take by mouth daily.    . ondansetron (ZOFRAN ODT) 4 MG disintegrating tablet Take 1 tablet (4 mg total) by mouth every 8 (eight) hours as needed for nausea or vomiting. 20 tablet 0  . Polyvinyl Alcohol-Povidone (REFRESH OP) Apply to eye 2 (two) times daily as needed.    . Turmeric 1053 MG TABS Take 600 mg by mouth. With black pepper extract    . vitamin B-12 (CYANOCOBALAMIN) 500 MCG tablet Take 500 mcg by mouth daily.    . Cholecalciferol 1.25 MG (50000 UT) TABS Take 2 tablets by mouth. (Patient not taking: Reported on 08/28/2020)    . docusate sodium (COLACE) 100 MG capsule Take 1 capsule (100 mg total) by mouth 2 (two) times daily as needed for mild  constipation. (Patient not taking: Reported on 08/28/2020) 30 capsule 0  . ibuprofen (ADVIL) 600 MG tablet Take 600 mg by mouth every 6 (six) hours as needed. (Patient not taking: Reported on 08/28/2020)    . ipratropium (ATROVENT) 0.06 % nasal spray Place 2 sprays into both nostrils 2 (two) times daily. (Patient not taking: Reported on 08/28/2020)    . lidocaine (LMX) 4 % cream Apply 1 application topically as needed (pain). (Patient not taking: Reported on 08/28/2020)    . meclizine (ANTIVERT) 25 MG tablet Take 1 tablet (25 mg total) by mouth 3 (three) times daily as needed for dizziness. (Patient not taking: Reported on 08/28/2020) 30 tablet 1  . oxyCODONE-acetaminophen (PERCOCET/ROXICET) 5-325 MG tablet Take 1 tablet by mouth every 6 (six) hours as needed for severe pain. (Patient not taking: Reported on 08/28/2020) 20 tablet 0   No current facility-administered medications for this visit.    Allergies as of 08/28/2020 - Review Complete 08/28/2020  Allergen Reaction Noted  . Phenergan [promethazine hcl] Other (See Comments) 03/24/2017  . Ciprofloxacin Swelling 04/05/2018  . Levofloxacin Swelling 04/05/2018    Family History  Problem  Relation Age of Onset  . Diabetes Mother   . Heart disease Mother   . Arthritis Mother   . Hypertension Mother   . Miscarriages / [place] Mother   . Early death Father   . Stroke Father   . Alcohol abuse Father   . Diabetes Sister   . Stroke Sister   . Diabetes Brother   . Diverticulitis Son   . Diabetes Sister   . Stroke Sister     Social History   Socioeconomic History  . Marital status: Married    Spouse name: Not on file  . Number of children: 1  . Years of education: 33  . Highest education level: Not on file  Occupational History  . Occupation: retired  Tobacco Use  . Smoking status: Former Smoker    Packs/day: 1.00    Years: 12.00    Pack years: 12.00    Types: Cigarettes    Start date: 05/11/1950    Quit date: 05/12/1963    Years since quitting: 57.3  . Smokeless tobacco: Never Used  Vaping Use  . Vaping Use: Never used  Substance and Sexual Activity  . Alcohol use: No  . Drug use: No  . Sexual activity: Not Currently    Birth control/protection: Post-menopausal  Other Topics Concern  . Not on file  Social History Narrative   Lives with husband [name]   One son [name] lives in [place]    Father was a [place] from [place], patient is bilingual   Married for over 79 years.    Caffeine use: 1 cup per day   Right handed   Social Determinants of Health   Financial Resource Strain: Not on file  Food Insecurity: Not on file  Transportation Needs: Not on file  Physical Activity: Not on file  Stress: Not on file  Social Connections: Not on file  Intimate Partner Violence: Not on file    Review of Systems: Gen: Denies any fever, chills, cold or flu like symptoms, pre-syncope, or syncope.  CV: Denies chest pain or heart palpitations Resp: Denies shortness of breath or cough.  GI: See HPI GU : Admits to urinary frequency and urge incontinence.  MS: Admits to neuropathy.  Derm: Denies rash Psych: Denies depression or anxiety.  Heme: See  HPI  Physical Exam: BP 124/69   Pulse [name])  59   Temp (!) 96.8 F (36 C) (Temporal)   Ht 5\' 2"  (1.575 m)   Wt 172 lb 12.8 oz (78.4 kg)   BMI 31.61 kg/m  General:   Alert and oriented. Pleasant and cooperative. Well-nourished and well-developed.  Head:  Normocephalic and atraumatic. Eyes:  Without icterus, sclera clear and conjunctiva pink.  Ears:  Normal auditory acuity. Lungs:  Clear to auscultation bilaterally. No wheezes, rales, or rhonchi. No distress.  Heart:  S1, S2 present without murmurs appreciated.  Abdomen:  +BS, soft, and non-distended. Minimal TTP in epigastric area. No HSM noted. No guarding or rebound. No masses appreciated.  Rectal:  Deferred  Msk:  Symmetrical without gross deformities. Normal posture. Extremities:  Without edema. Neurologic:  Alert and  oriented x4;  grossly normal neurologically. Skin:  Intact without significant lesions or rashes. Psych: Normal mood and affect.   Assessment: 87 year old female presenting today at request of PCP for further evaluation of constipation.  Patient reports since starting Citrucel 1 tablespoon nightly, she has had significant improvement in constipation, typically with 1-2 BMs daily.  Occasional stool urgency after meals, especially if eating out. Occasional loose stools, but not routine. Little cramping prior to BMs that improves thereafter.  Reports she had a few episodes of blood on toilet tissue in the setting of known hemorrhoids which she has had since giving birth to her son.  The symptoms have resolved with better management of constipation.  Denies melena or unintentional weight loss.  Reports last colonoscopy was in 2004 in [place].  Does not think she had any polyps.  No other significant GI symptoms.  May have mild IBS symptoms as she reports intermittent lower abdominal cramping prior to BMs and occasional urgency though I suspect diet is also playing a role as this is typically worse with fattier meals.    Plan:  1. Continue Citrucel 1 tablespoon nightly. If needed, may increase this up to 3 times per day. 2.  Follow a low fat diet.   Meats should be lean (polutry or fish) and baked, boiled, or broiled.   Avoid fried/fatty/greasy foods.  3.  Try pepermint oil or IBgard 30 minutes before meals.   4.  Discussed follow-up. Patient prefers to follow-up PRN.     Aliene Altes, PA-C [facility] 08/28/2020

## 2020-08-28 ENCOUNTER — Ambulatory Visit (INDEPENDENT_AMBULATORY_CARE_PROVIDER_SITE_OTHER): Payer: Medicare HMO | Admitting: Gastroenterology

## 2020-08-28 ENCOUNTER — Encounter: Payer: Self-pay | Admitting: Gastroenterology

## 2020-08-28 ENCOUNTER — Other Ambulatory Visit: Payer: Self-pay

## 2020-08-28 VITALS — BP 124/69 | HR 59 | Temp 96.8°F | Ht 62.0 in | Wt 172.8 lb

## 2020-08-28 DIAGNOSIS — R109 Unspecified abdominal pain: Secondary | ICD-10-CM | POA: Diagnosis not present

## 2020-08-28 DIAGNOSIS — K5900 Constipation, unspecified: Secondary | ICD-10-CM

## 2020-08-28 NOTE — Progress Notes (Signed)
Cc'ed to pcp °

## 2020-08-28 NOTE — Patient Instructions (Addendum)
Continue Citrucel 1 tablespoon nightly. If needed, you may increase this up to 3 times per day.   To help with occasional stool urgency and mild cramping after meals:  Follow a low fat diet.  Meats should be lean (polutry or fish) and baked, boiled, or broiled.  Avoid fried/fatty/greasy foods.  Try pepermint oil or IBgard 30 minutes before meals.    We will plan to see you back as needed. Do not hesitate to call if you have new GI concerns.   It was great to meet you today!   Aliene Altes, PA-C [facility]

## 2020-09-09 DIAGNOSIS — M2560 Stiffness of unspecified joint, not elsewhere classified: Secondary | ICD-10-CM | POA: Diagnosis not present

## 2020-09-09 DIAGNOSIS — H8110 Benign paroxysmal vertigo, unspecified ear: Secondary | ICD-10-CM | POA: Diagnosis not present

## 2020-09-09 DIAGNOSIS — R2689 Other abnormalities of gait and mobility: Secondary | ICD-10-CM | POA: Diagnosis not present

## 2020-10-03 ENCOUNTER — Telehealth (INDEPENDENT_AMBULATORY_CARE_PROVIDER_SITE_OTHER): Payer: Self-pay | Admitting: Internal Medicine

## 2020-10-03 ENCOUNTER — Other Ambulatory Visit (INDEPENDENT_AMBULATORY_CARE_PROVIDER_SITE_OTHER): Payer: Self-pay | Admitting: Internal Medicine

## 2020-10-03 ENCOUNTER — Other Ambulatory Visit (INDEPENDENT_AMBULATORY_CARE_PROVIDER_SITE_OTHER): Payer: Self-pay

## 2020-10-03 DIAGNOSIS — E1142 Type 2 diabetes mellitus with diabetic polyneuropathy: Secondary | ICD-10-CM

## 2020-10-03 MED ORDER — METFORMIN HCL 500 MG PO TABS: 1.0000 | ORAL_TABLET | Freq: Every day | ORAL | 1 refills | Status: DC

## 2020-10-03 MED ORDER — GABAPENTIN 400 MG PO CAPS: 800.0000 mg | ORAL_CAPSULE | Freq: Four times a day (QID) | ORAL | 1 refills | Status: DC

## 2020-10-09 NOTE — Telephone Encounter (Signed)
Done

## 2020-10-10 ENCOUNTER — Ambulatory Visit (INDEPENDENT_AMBULATORY_CARE_PROVIDER_SITE_OTHER): Payer: Medicare HMO | Admitting: Internal Medicine

## 2020-10-10 ENCOUNTER — Other Ambulatory Visit: Payer: Self-pay

## 2020-10-10 ENCOUNTER — Encounter (INDEPENDENT_AMBULATORY_CARE_PROVIDER_SITE_OTHER): Payer: Self-pay | Admitting: Internal Medicine

## 2020-10-10 VITALS — BP 115/67 | HR 66 | Temp 98.4°F | Resp 18 | Ht 61.0 in | Wt 170.6 lb

## 2020-10-10 DIAGNOSIS — E119 Type 2 diabetes mellitus without complications: Secondary | ICD-10-CM

## 2020-10-10 DIAGNOSIS — E782 Mixed hyperlipidemia: Secondary | ICD-10-CM | POA: Diagnosis not present

## 2020-10-10 DIAGNOSIS — E1142 Type 2 diabetes mellitus with diabetic polyneuropathy: Secondary | ICD-10-CM | POA: Diagnosis not present

## 2020-10-10 DIAGNOSIS — D333 Benign neoplasm of cranial nerves: Secondary | ICD-10-CM

## 2020-10-10 DIAGNOSIS — E559 Vitamin D deficiency, unspecified: Secondary | ICD-10-CM | POA: Diagnosis not present

## 2020-10-10 DIAGNOSIS — M069 Rheumatoid arthritis, unspecified: Secondary | ICD-10-CM

## 2020-10-10 NOTE — Progress Notes (Signed)
Metrics: Intervention Frequency ACO  Documented Smoking Status Yearly  Screened one or more times in 24 months  Cessation Counseling or  Active cessation medication Past 24 months  Past 24 months   Guideline developer: UpToDate (See UpToDate for funding source) Date Released: 2014       Wellness Office Visit  Subjective:  Patient ID: Jessica Russell, female    DOB: 09-Nov-1927  Age: 87 y.o. MRN: [id number]  CC: This lady comes in for follow-up of diabetes with neuropathy, hyperlipidemia, vitamin D deficiency. HPI Since the last time I saw her, she has had urological-gynecological surgery on her bladder at [facility]. She is also had gamma knife therapy regarding the vestibular schwannoma that was diagnosed by Dr. Benjamine Mola which originated from her dizziness symptoms. She continues with metformin for diabetes.  Her peripheral neuropathy is really bothering her at the present time.  She takes gabapentin and high doses 4 times a day but still does not seem to get good relief.  She has not seen her neurologist since 2019.  Past Medical History:  Diagnosis Date  . Diabetic nephropathy ([place])   . Foot ulcer, right, with fat layer exposed ([place])    non-healing  . History of bilateral breast cancer 04-14-2018 per pt no recurrence   dx 2000 w/ right breast cancer, s/p  lumpectomy with node dissection completed radiation/  dx 2002 w/ left breast cancer, s/p lumpectomy with node dissection,  completed radiation  . History of diverticulitis of colon 2009   s/p  partial colectomy for perforated diverticulitis  . History of external beam radiation therapy    2000 for right breast cancer;   2002  for left breast cancer  (per pt 36 treatment for each breast)  . HLD (hyperlipidemia) 03/24/2017  . Neuropathy   . OA (osteoarthritis)   . Osteoporosis 04/14/2017  . Peripheral arterial occlusive disease ([place])    04-13-2018   s/p  balloon angioplasty to the anterior tibial artery for severe stenosis right  lower extremity (dr Fletcher Anon)  . Personal history of radiation therapy    both breasts  . Rheumatoid arthritis ([place]) 03/13/2019  . Type 2 diabetes mellitus ([place])   . Vestibular schwannoma [facility])     S/p Gamma Knife stereotactic radiosurgery April 2022  . Vitamin D deficiency disease 03/13/2019   Past Surgical History:  Procedure Laterality Date  . ABDOMINAL AORTOGRAM W/LOWER EXTREMITY N/A 04/13/2018   Procedure: ABDOMINAL AORTOGRAM W/LOWER EXTREMITY;  Surgeon: [name], MD;  Location: [place] [facility];  Service: Cardiovascular;  Laterality: N/A;  . APPENDECTOMY  age 63  . BREAST LUMPECTOMY Right 2000  . BREAST LUMPECTOMY Left 2002  . BREAST LUMPECTOMY WITH AXILLARY LYMPH NODE DISSECTION Bilateral right 2000;   left 2002  . BUNIONECTOMY Bilateral 1968  . CARPAL TUNNEL RELEASE Right 2017  . CATARACT EXTRACTION W/PHACO Right 08/30/2017   Procedure: CATARACT EXTRACTION PHACO AND INTRAOCULAR LENS PLACEMENT RIGHT EYE;  Surgeon: [name], MD;  [facility]: [facility];  Service: Ophthalmology;  Laterality: Right;  CDE: 13.63  . CATARACT EXTRACTION W/PHACO Left 09/20/2017   Procedure: CATARACT EXTRACTION PHACO AND INTRAOCULAR LENS PLACEMENT LEFT EYE;  Surgeon: [name], MD;  Location: [facility];  Service: Ophthalmology;  Laterality: Left;  CDE: 11.25  . D & C HYSTEROSCOPY W/ RESECTION POLYP  2017   per pt benign  . FOOT SURGERY     removal of two bones  . FRACTURE SURGERY Left 05/12/2016   patella fracture,   per  pt no retatined hardware  . GRAFT APPLICATION N/A 76/05/6071   Procedure: APPLICATION OF STRAVIX GRAFT;  Surgeon: [name], DPM;  Location: [place];  Service: Podiatry;  Laterality: N/A;  . HEMICOLECTOMY  2009   diverticulitis w/ perforation  . METATARSAL HEAD EXCISION N/A 04/18/2018   Procedure: FIRST METATARSAL HEAD RECESSION, WOUND DEBRIEDMENT;  Surgeon: [name], DPM;  Location: [place];  Service: Podiatry;  Laterality: N/A;  .  NASAL SEPTOPLASTY W/ TURBINOPLASTY Bilateral 01/04/2018   Procedure: NASAL SEPTOPLASTY WITH BILATERAL TURBINATE REDUCTION;  Surgeon: [name], MD;  Location: [place];  Service: ENT;  Laterality: Bilateral;  . PERIPHERAL VASCULAR BALLOON ANGIOPLASTY  04/13/2018   Procedure: PERIPHERAL VASCULAR BALLOON ANGIOPLASTY;  Surgeon: [name], MD;  Location: [place] [facility];  Service: Cardiovascular;;  Anterior Tibial  . SKIN GRAFT     left foot   . VENTRAL HERNIA REPAIR  01-07-2016    [name], [place]   w/ mesh     Family History  Problem Relation Age of Onset  . Diabetes Mother   . Heart disease Mother   . Arthritis Mother   . Hypertension Mother   . Miscarriages / [place] Mother   . Early death Father   . Stroke Father   . Alcohol abuse Father   . Diabetes Sister   . Stroke Sister   . Diabetes Brother   . Diverticulitis Son   . Diabetes Sister   . Stroke Sister   . Colon cancer Neg Hx     Social History   Social History Narrative   Lives with husband [name]   One son [name] lives in [place]    Father was a [place] from [place], patient is bilingual   Married for over 26 years.    Caffeine use: 1 cup per day   Right handed   Social History   Tobacco Use  . Smoking status: Former Smoker    Packs/day: 1.00    Years: 12.00    Pack years: 12.00    Types: Cigarettes    Start date: 05/11/1950    Quit date: 05/12/1963    Years since quitting: 57.4  . Smokeless tobacco: Never Used  Substance Use Topics  . Alcohol use: No    Current Meds  Medication Sig  . acetaminophen (TYLENOL) 500 MG tablet Take 1,500 mg by mouth at bedtime.  [name] aspirin EC 81 MG tablet Take 1 tablet (81 mg total) by mouth daily.  [name] CALCIUM-MAGNESIUM PO Take by mouth daily.  . carboxymethylcellul-glycerin (OPTIVE) 0.5-0.9 % ophthalmic solution Apply to eye.  . Cholecalciferol 125 MCG (5000 UT) TABS Take 1 tablet by mouth daily.  . fluocinonide gel (LIDEX) 7.10 % Apply 1  application topically as needed (mouth sores).   . folic acid (FOLVITE) 1 MG tablet Take 1 mg by mouth daily.  [name] gabapentin (NEURONTIN) 400 MG capsule Take 2 capsules (800 mg total) by mouth 4 (four) times daily.  [name] latanoprost (XALATAN) 0.005 % ophthalmic solution   . Menaquinone-7 (VITAMIN K2 PO) Take by mouth daily.  . metFORMIN (GLUCOPHAGE) 500 MG tablet Take 1 tablet (500 mg total) by mouth daily.  . methotrexate (RHEUMATREX) 2.5 MG tablet Take 15 mg by mouth once a week. Caution:Chemotherapy. Protect from light.  . Methylcellulose, Laxative, (CITRUCEL PO) Take by mouth at bedtime. 1 tablespoon in 3 oz water  . Multiple Vitamins-Minerals (CENTRUM SILVER PO) Take by mouth daily.  . Omega-3 Fatty Acids (  RA FISH OIL) 1000 MG CAPS Take by mouth daily.  . Polyvinyl Alcohol-Povidone (REFRESH OP) Apply to eye 2 (two) times daily as needed.  . Turmeric 1053 MG TABS Take 600 mg by mouth. With black pepper extract  . vitamin B-12 (CYANOCOBALAMIN) 500 MCG tablet Take 500 mcg by mouth daily.     Flowsheet Row Clinical Support from 07/10/2020 in [facility]  PHQ-9 Total Score 0      Objective:   Today's Vitals: BP 115/67 (BP Location: Right Arm, Patient Position: Sitting, Cuff Size: Normal)   Pulse 66   Temp 98.4 F (36.9 C) (Temporal)   Resp 18   Ht 5\' 1"  (1.549 m)   Wt 170 lb 9.6 oz (77.4 kg)   SpO2 96%   BMI 32.23 kg/m  Vitals with BMI 10/10/2020 08/28/2020 07/24/2020  Height 5\' 1"  5\' 2"  5\' 2"   Weight 170 lbs 10 oz 172 lbs 13 oz 172 lbs  BMI 32.25 04.8 88.91  Systolic 694 503 888  Diastolic 67 69 83  Pulse 66 59 78     Physical Exam  She remains obese.  Weight is stable.  Blood pressure is excellent.     Assessment   1. Diabetes mellitus type II, non insulin dependent ([place])   2. Diabetic polyneuropathy associated with type 2 diabetes mellitus ([place])   3. Mixed hyperlipidemia   4. Vitamin D deficiency disease   5. Vestibular schwannoma ([place])   6. Rheumatoid  arthritis involving both hands, unspecified whether rheumatoid factor present ([place])       Tests ordered Orders Placed This Encounter  Procedures  . COMPLETE METABOLIC PANEL WITH GFR  . Hemoglobin A1c  . Magnesium  . Ambulatory referral to Neurology     Plan: 1. Continue with metformin for the diabetes and we will check an A1c. 2. I will refer back to neurology regarding the peripheral neuropathy to see if there are any other therapeutic options. 3. I have urged the patient to go back and see ENT, Dr. Benjamine Mola regarding the dizziness that is still somewhat persistent and she may have to go and see the neurosurgeon who did the gamma knife procedure on her. 4. Follow-up with Sarah in about 4 months   No orders of the defined types were placed in this encounter.   Doree Albee, MD

## 2020-10-11 LAB — COMPLETE METABOLIC PANEL WITH GFR
AG Ratio: 1.7 (calc) (ref 1.0–2.5)
ALT: 17 U/L (ref 6–29)
AST: 18 U/L (ref 10–35)
Albumin: 4.4 g/dL (ref 3.6–5.1)
Alkaline phosphatase (APISO): 71 U/L (ref 37–153)
BUN/Creatinine Ratio: 21 (calc) (ref 6–22)
BUN: 21 mg/dL (ref 7–25)
CO2: 23 mmol/L (ref 20–32)
Calcium: 10.6 mg/dL — ABNORMAL HIGH (ref 8.6–10.4)
Chloride: 103 mmol/L (ref 98–110)
Creat: 0.98 mg/dL — ABNORMAL HIGH (ref 0.60–0.88)
GFR, Est African American: 59 mL/min/{1.73_m2} — ABNORMAL LOW (ref >=60)
GFR, Est Non African American: 51 mL/min/{1.73_m2} — ABNORMAL LOW (ref >=60)
Globulin: 2.6 g/dL (calc) (ref 1.9–3.7)
Glucose, Bld: 87 mg/dL (ref 65–139)
Potassium: 5.2 mmol/L (ref 3.5–5.3)
Sodium: 137 mmol/L (ref 135–146)
Total Bilirubin: 0.3 mg/dL (ref 0.2–1.2)
Total Protein: 7 g/dL (ref 6.1–8.1)

## 2020-10-11 LAB — MAGNESIUM: Magnesium: 1.9 mg/dL (ref 1.5–2.5)

## 2020-10-11 LAB — HEMOGLOBIN A1C
Hgb A1c MFr Bld: 6.3 % of total Hgb — ABNORMAL HIGH (ref <5.7)
Mean Plasma Glucose: 134 mg/dL
eAG (mmol/L): 7.4 mmol/L

## 2020-10-15 ENCOUNTER — Encounter (INDEPENDENT_AMBULATORY_CARE_PROVIDER_SITE_OTHER): Payer: Self-pay

## 2021-02-04 ENCOUNTER — Encounter: Payer: Self-pay | Admitting: Emergency Medicine

## 2021-02-04 ENCOUNTER — Ambulatory Visit
Admission: EM | Admit: 2021-02-04 | Discharge: 2021-02-04 | Disposition: A | Discharge status: Home / Self Care | Payer: Medicare HMO | Attending: Physician Assistant | Admitting: Physician Assistant

## 2021-02-04 ENCOUNTER — Ambulatory Visit (INDEPENDENT_AMBULATORY_CARE_PROVIDER_SITE_OTHER): Payer: Medicare HMO

## 2021-02-04 ENCOUNTER — Telehealth: Payer: Self-pay | Admitting: Orthopedic Surgery

## 2021-02-04 ENCOUNTER — Other Ambulatory Visit: Payer: Self-pay

## 2021-02-04 DIAGNOSIS — S2232XA Fracture of one rib, left side, initial encounter for closed fracture: Secondary | ICD-10-CM

## 2021-02-04 NOTE — ED Triage Notes (Signed)
Fell this morning off of bed, left side pain and left arm pain.

## 2021-02-04 NOTE — Discharge Instructions (Signed)
Tylenol every 4 hours.  Go to the Emergency department if any problems. breathing

## 2021-02-04 NOTE — Telephone Encounter (Signed)
Patient called to relay she has fallen today and thinks she may have fractured her ribs. Relayed that our clinic does not treat this medical issue. Options given of urgent care, emergency room, or primary care. Patient relays she has established with new PCP, Dr Posey Pronto. States will go on to urgent care, and aware if anything orthopaedic is needed once evaluated there, to call back.

## 2021-02-05 ENCOUNTER — Ambulatory Visit (INDEPENDENT_AMBULATORY_CARE_PROVIDER_SITE_OTHER): Payer: Medicare HMO | Admitting: Nurse Practitioner

## 2021-02-06 NOTE — ED Provider Notes (Signed)
[facility]    CSN: [id number] Arrival date & time: 02/04/21  1400      History   Chief Complaint No chief complaint on file.   HPI Jessica Russell is a 87 y.o. female.   The history is provided by the patient. No language interpreter was used.  Fall This is a new problem. The current episode started 3 to 5 hours ago. The problem occurs constantly. The problem has not changed since onset.Associated symptoms include chest pain. Nothing aggravates the symptoms. Nothing relieves the symptoms. She has tried nothing for the symptoms.  Pt fell an hit left side.  Pt complains of soreness in her chest Past Medical History:  Diagnosis Date   Diabetic nephropathy ([place])    Foot ulcer, right, with fat layer exposed ([place])    non-healing   History of bilateral breast cancer 04-14-2018 per pt no recurrence   dx 2000 w/ right breast cancer, s/p  lumpectomy with node dissection completed radiation/  dx 2002 w/ left breast cancer, s/p lumpectomy with node dissection,  completed radiation   History of diverticulitis of colon 2009   s/p  partial colectomy for perforated diverticulitis   History of external beam radiation therapy    2000 for right breast cancer;   2002  for left breast cancer  (per pt 36 treatment for each breast)   HLD (hyperlipidemia) 03/24/2017   Neuropathy    OA (osteoarthritis)    Osteoporosis 04/14/2017   Peripheral arterial occlusive disease ([place])    04-13-2018   s/p  balloon angioplasty to the anterior tibial artery for severe stenosis right lower extremity (dr Fletcher Anon)   Personal history of radiation therapy    both breasts   Rheumatoid arthritis ([place]) 03/13/2019   Type 2 diabetes mellitus ([place])    Vestibular schwannoma ([place])     S/p Gamma Knife stereotactic radiosurgery April 2022   Vitamin D deficiency disease 03/13/2019    Patient Active Problem List   Diagnosis Date Noted   Constipation 08/28/2020   Incomplete emptying of bladder 02/28/2020    Neuropathy 02/28/2020   Urge incontinence of urine 02/28/2020   Vitamin D deficiency disease 03/13/2019   Rheumatoid arthritis ([place]) 03/13/2019   Positive fecal occult blood test 12/12/2018   Hemorrhoids 12/12/2018   Rectal bleeding 12/12/2018   Vaginal irritation from pessary ([place]) 12/12/2018   PMB (postmenopausal bleeding) 12/12/2018   Uterovaginal prolapse, incomplete 07/04/2018   Pessary maintenance 07/04/2018   Abscess of right foot    Diabetes mellitus type II, non insulin dependent ([place]) 05/03/2017   Diabetic foot ulcer ([place]) 05/03/2017   Cellulitis 05/03/2017   Diabetic foot infection ([place])    Chronic osteoarthritis 04/14/2017   Episodic recurrent vertigo 04/14/2017   Osteoporosis 04/14/2017   Diabetic nephropathy ([place]) 03/24/2017   Diabetic neuropathy associated with type 2 diabetes mellitus ([place]) 03/24/2017   Cataracts, bilateral 03/24/2017   History of bilateral breast cancer 03/24/2017   Recurrent UTI 03/24/2017   HLD (hyperlipidemia) 03/24/2017   Diverticulosis 03/24/2017    Past Surgical History:  Procedure Laterality Date   ABDOMINAL AORTOGRAM W/LOWER EXTREMITY N/A 04/13/2018   Procedure: ABDOMINAL AORTOGRAM W/LOWER EXTREMITY;  Surgeon: [name], MD;  Location: [facility] [facility];  Service: Cardiovascular;  Laterality: N/A;   APPENDECTOMY  age 71   BREAST LUMPECTOMY Right 2000   BREAST LUMPECTOMY Left 2002   BREAST LUMPECTOMY WITH AXILLARY LYMPH NODE DISSECTION Bilateral right 2000;   left 2002   BUNIONECTOMY Bilateral 1968  CARPAL TUNNEL RELEASE Right 2017   CATARACT EXTRACTION W/PHACO Right 08/30/2017   Procedure: CATARACT EXTRACTION PHACO AND INTRAOCULAR LENS PLACEMENT RIGHT EYE;  Surgeon: [name], MD;  [facility];  Service: Ophthalmology;  Laterality: Right;  CDE: 13.63   CATARACT EXTRACTION W/PHACO Left 09/20/2017   Procedure: CATARACT EXTRACTION PHACO AND INTRAOCULAR LENS PLACEMENT LEFT EYE;  Surgeon: [name], MD;  [facility];   Service: Ophthalmology;  Laterality: Left;  CDE: 11.25   D & C HYSTEROSCOPY W/ RESECTION POLYP  2017   per pt benign   FOOT SURGERY     removal of two bones   FRACTURE SURGERY Left 05/12/2016   patella fracture,   per pt no retatined hardware   GRAFT APPLICATION N/A 16/05/958   Procedure: APPLICATION OF STRAVIX GRAFT;  Surgeon: [name], DPM;  Location: [place];  Service: Podiatry;  Laterality: N/A;   HEMICOLECTOMY  2009   diverticulitis w/ perforation   METATARSAL HEAD EXCISION N/A 04/18/2018   Procedure: FIRST METATARSAL HEAD RECESSION, WOUND DEBRIEDMENT;  Surgeon: [name], DPM;  Location: [facility];  Service: Podiatry;  Laterality: N/A;   NASAL SEPTOPLASTY W/ TURBINOPLASTY Bilateral 01/04/2018   Procedure: NASAL SEPTOPLASTY WITH BILATERAL TURBINATE REDUCTION;  Surgeon: [name], MD;  Location: [place];  Service: ENT;  Laterality: Bilateral;   PERIPHERAL VASCULAR BALLOON ANGIOPLASTY  04/13/2018   Procedure: PERIPHERAL VASCULAR BALLOON ANGIOPLASTY;  Surgeon: [name], MD;  Location: [facility];  Service: Cardiovascular;;  Anterior Tibial   SKIN GRAFT     left foot    VENTRAL HERNIA REPAIR  01-07-2016    [place], [place]   w/ mesh    OB History     Gravida  1   Para  1   Term  1   Preterm      AB      Living  1      SAB      IAB      Ectopic      Multiple      Live Births  1            Home Medications    Prior to Admission medications   Medication Sig Start Date End Date Taking? Authorizing Provider  acetaminophen (TYLENOL) 500 MG tablet Take 1,500 mg by mouth at bedtime.    [provider]  aspirin EC 81 MG tablet Take 1 tablet (81 mg total) by mouth daily. 06/03/18   Kroeger, Lorelee Cover., PA-C  CALCIUM-MAGNESIUM PO Take by mouth daily.    [provider]  carboxymethylcellul-glycerin (OPTIVE) 0.5-0.9 % ophthalmic solution Apply to eye.    [provider]  Cholecalciferol 125 MCG (5000 UT) TABS Take 1 tablet by mouth daily.    [provider]  fluocinonide gel (LIDEX) 4.54 % Apply 1 application topically as needed (mouth sores).  03/14/18   [provider]  folic acid (FOLVITE) 1 MG tablet Take 1 mg by mouth daily.    [provider]  gabapentin (NEURONTIN) 400 MG capsule Take 2 capsules (800 mg total) by mouth 4 (four) times daily. 10/03/20   Doree Albee, MD  latanoprost (XALATAN) 0.005 % ophthalmic solution  02/21/20   [provider]  Menaquinone-7 (VITAMIN K2 PO) Take by mouth daily.    [provider]  metFORMIN (GLUCOPHAGE) 500 MG tablet Take 1 tablet (500 mg total) by mouth daily. 10/03/20   Anastasio Champion, Nimish  C, MD  methotrexate (RHEUMATREX) 2.5 MG tablet Take 15 mg by mouth once a week. Caution:Chemotherapy. Protect from light.    [provider]  Methylcellulose, Laxative, (CITRUCEL PO) Take by mouth at bedtime. 1 tablespoon in 3 oz water    [provider]  Multiple Vitamins-Minerals (CENTRUM SILVER PO) Take by mouth daily.    [provider]  Omega-3 Fatty Acids (RA FISH OIL) 1000 MG CAPS Take by mouth daily.    [provider]  Polyvinyl Alcohol-Povidone (REFRESH OP) Apply to eye 2 (two) times daily as needed.    [provider]  Turmeric 1053 MG TABS Take 600 mg by mouth. With black pepper extract    [provider]  vitamin B-12 (CYANOCOBALAMIN) 500 MCG tablet Take 500 mcg by mouth daily.    [provider]    Family History Family History  Problem Relation Age of Onset   Diabetes Mother    Heart disease Mother    Arthritis Mother    Hypertension Mother    49 / [place] Mother    Early death Father    Stroke Father    Alcohol abuse Father    Diabetes Sister    Stroke Sister    Diabetes Brother    Diverticulitis Son    Diabetes Sister    Stroke Sister    Colon cancer Neg Hx     Social  History Social History   Tobacco Use   Smoking status: Former    Packs/day: 1.00    Years: 12.00    Pack years: 12.00    Types: Cigarettes    Start date: 05/11/1950    Quit date: 05/12/1963    Years since quitting: 57.7   Smokeless tobacco: Never  Vaping Use   Vaping Use: Never used  Substance Use Topics   Alcohol use: No   Drug use: No     Allergies   Phenergan [promethazine hcl], Ciprofloxacin, and Levofloxacin   Review of Systems Review of Systems  Cardiovascular:  Positive for chest pain.  All other systems reviewed and are negative.   Physical Exam Triage Vital Signs ED Triage Vitals  Enc Vitals Group     BP 02/04/21 1610 (!) 179/68     Pulse Rate 02/04/21 1610 71     Resp 02/04/21 1610 18     Temp 02/04/21 1613 (!) 97.4 F (36.3 C)     Temp Source 02/04/21 1613 Oral     SpO2 02/04/21 1610 93 %     Weight --      Height --      Head Circumference --      Peak Flow --      Pain Score 02/04/21 1613 8     Pain Loc --      Pain Edu? --      Excl. in [place]? --    No data found.  Updated Vital Signs BP (!) 179/68 (BP Location: Right Arm)   Pulse 71   Temp (!) 97.4 F (36.3 C) (Oral)   Resp 18   SpO2 93%   Visual Acuity Right Eye Distance:   Left Eye Distance:   Bilateral Distance:    Right Eye Near:   Left Eye Near:    Bilateral Near:     Physical Exam Vitals and nursing note reviewed.  Constitutional:      Appearance: She is well-developed.  HENT:     Head: Normocephalic.  Cardiovascular:     Rate and  Rhythm: Normal rate.  Pulmonary:     Effort: Pulmonary effort is normal.     Comments: Tender left ribs Abdominal:     General: Abdomen is flat. Bowel sounds are normal. There is no distension.     Palpations: Abdomen is soft.     Tenderness: There is no abdominal tenderness. There is no guarding.  Musculoskeletal:        General: Normal range of motion.     Cervical back: Normal range of motion.  Neurological:     Mental Status: She is  alert and oriented to person, place, and time.     UC Treatments / Results  Labs (all labs ordered are listed, but only abnormal results are displayed) Labs Reviewed - No data to display  EKG   Radiology DG Ribs Unilateral W/Chest Left  Result Date: 02/04/2021 CLINICAL DATA:  [place] out of bed, left-sided pain EXAM: LEFT RIBS AND CHEST - 3+ VIEW COMPARISON:  None. FINDINGS: Frontal view of the chest as well as frontal and oblique views of the left thoracic cage are obtained. Cardiac silhouette is unremarkable. Aorta is ectatic, with mild atherosclerosis. No airspace disease, effusion, or pneumothorax. Postsurgical changes are seen within the bilateral axilla. There may be a nondisplaced left anterior seventh rib fracture best seen on the oblique projections. IMPRESSION: 1. No acute intrathoracic process. 2. Suspected nondisplaced left anterior seventh rib fracture. Please correlate with site of patient's pain. Electronically Signed   By: Randa Ngo M.D.   On: 02/04/2021 17:31    Procedures Procedures (including critical care time)  Medications Ordered in UC Medications - No data to display  Initial Impression / Assessment and Plan / UC Course  I have reviewed the triage vital signs and the nursing notes.  Pertinent labs & imaging results that were available during my care of the patient were reviewed by me and considered in my medical decision making (see chart for details).      Final Clinical Impressions(s) / UC Diagnoses   Final diagnoses:  Closed fracture of one rib of left side, initial encounter     Discharge Instructions      Tylenol every 4 hours.  Go to the Emergency department if any problems. breathing   ED Prescriptions   None    An After Visit Summary was printed and given to the patient.  PDMP not reviewed this encounter.   Fransico Meadow, [place] 02/06/21 1011

## 2021-02-13 DIAGNOSIS — Z923 Personal history of irradiation: Secondary | ICD-10-CM | POA: Insufficient documentation

## 2021-03-17 ENCOUNTER — Encounter: Payer: Self-pay | Admitting: Internal Medicine

## 2021-03-17 ENCOUNTER — Ambulatory Visit (HOSPITAL_COMMUNITY)
Admission: RE | Admit: 2021-03-17 | Discharge: 2021-03-17 | Disposition: A | Discharge status: Home / Self Care | Payer: Medicare HMO | Source: Ambulatory Visit | Attending: Internal Medicine | Admitting: Internal Medicine

## 2021-03-17 ENCOUNTER — Ambulatory Visit (INDEPENDENT_AMBULATORY_CARE_PROVIDER_SITE_OTHER): Payer: Medicare HMO | Admitting: Internal Medicine

## 2021-03-17 ENCOUNTER — Other Ambulatory Visit: Payer: Self-pay

## 2021-03-17 VITALS — BP 118/68 | HR 60 | Resp 18 | Ht 62.0 in | Wt 173.0 lb

## 2021-03-17 DIAGNOSIS — E1142 Type 2 diabetes mellitus with diabetic polyneuropathy: Secondary | ICD-10-CM | POA: Diagnosis not present

## 2021-03-17 DIAGNOSIS — M069 Rheumatoid arthritis, unspecified: Secondary | ICD-10-CM

## 2021-03-17 DIAGNOSIS — M8580 Other specified disorders of bone density and structure, unspecified site: Secondary | ICD-10-CM | POA: Diagnosis not present

## 2021-03-17 DIAGNOSIS — E559 Vitamin D deficiency, unspecified: Secondary | ICD-10-CM

## 2021-03-17 DIAGNOSIS — D333 Benign neoplasm of cranial nerves: Secondary | ICD-10-CM | POA: Diagnosis not present

## 2021-03-17 DIAGNOSIS — Z8781 Personal history of (healed) traumatic fracture: Secondary | ICD-10-CM

## 2021-03-17 DIAGNOSIS — R296 Repeated falls: Secondary | ICD-10-CM

## 2021-03-17 DIAGNOSIS — E1149 Type 2 diabetes mellitus with other diabetic neurological complication: Secondary | ICD-10-CM

## 2021-03-17 DIAGNOSIS — Z923 Personal history of irradiation: Secondary | ICD-10-CM

## 2021-03-17 NOTE — Patient Instructions (Signed)
Please continue to take medications as prescribed.  Please eat at regular intervals and maintain at least 50 ounces of fluid intake.  Please contact us if you have dizziness/vertigo or worsening of neuropathy symptoms.

## 2021-03-18 ENCOUNTER — Other Ambulatory Visit: Payer: Self-pay | Admitting: Internal Medicine

## 2021-03-18 LAB — CMP14+EGFR
ALT: 21 IU/L (ref 0–32)
AST: 31 IU/L (ref 0–40)
Albumin/Globulin Ratio: 2.1 (ref 1.2–2.2)
Albumin: 4.6 g/dL (ref 3.5–4.6)
Alkaline Phosphatase: 78 IU/L (ref 44–121)
BUN/Creatinine Ratio: 23 (ref 12–28)
BUN: 17 mg/dL (ref 10–36)
Bilirubin Total: 0.3 mg/dL (ref 0.0–1.2)
CO2: 23 mmol/L (ref 20–29)
Calcium: 9.8 mg/dL (ref 8.7–10.3)
Chloride: 102 mmol/L (ref 96–106)
Creatinine, Ser: 0.73 mg/dL (ref 0.57–1.00)
Globulin, Total: 2.2 g/dL (ref 1.5–4.5)
Glucose: 99 mg/dL (ref 70–99)
Potassium: 4.8 mmol/L (ref 3.5–5.2)
Sodium: 139 mmol/L (ref 134–144)
Total Protein: 6.8 g/dL (ref 6.0–8.5)
eGFR: 78 mL/min/{1.73_m2} (ref >59)

## 2021-03-18 LAB — CBC WITH DIFFERENTIAL/PLATELET
Basophils Absolute: 0.1 10*3/uL (ref 0.0–0.2)
Basos: 2 %
EOS (ABSOLUTE): 0.2 10*3/uL (ref 0.0–0.4)
Eos: 4 %
Hematocrit: 39.8 % (ref 34.0–46.6)
Hemoglobin: 13.1 g/dL (ref 11.1–15.9)
Immature Grans (Abs): 0 10*3/uL (ref 0.0–0.1)
Immature Granulocytes: 0 %
Lymphocytes Absolute: 0.9 10*3/uL (ref 0.7–3.1)
Lymphs: 22 %
MCH: 30.1 pg (ref 26.6–33.0)
MCHC: 32.9 g/dL (ref 31.5–35.7)
MCV: 92 fL (ref 79–97)
Monocytes Absolute: 0.4 10*3/uL (ref 0.1–0.9)
Monocytes: 11 %
Neutrophils Absolute: 2.3 10*3/uL (ref 1.4–7.0)
Neutrophils: 61 %
Platelets: 186 10*3/uL (ref 150–450)
RBC: 4.35 x10E6/uL (ref 3.77–5.28)
RDW: 14.1 % (ref 11.7–15.4)
WBC: 3.8 10*3/uL (ref 3.4–10.8)

## 2021-03-18 LAB — HEMOGLOBIN A1C
Est. average glucose Bld gHb Est-mCnc: 137 mg/dL
Hgb A1c MFr Bld: 6.4 % — ABNORMAL HIGH (ref 4.8–5.6)

## 2021-03-18 LAB — VITAMIN D 25 HYDROXY (VIT D DEFICIENCY, FRACTURES): Vit D, 25-Hydroxy: 101 ng/mL — ABNORMAL HIGH (ref 30.0–100.0)

## 2021-03-18 LAB — TSH: TSH: 1.25 u[IU]/mL (ref 0.450–4.500)

## 2021-03-21 DIAGNOSIS — R296 Repeated falls: Secondary | ICD-10-CM | POA: Insufficient documentation

## 2021-03-21 NOTE — Assessment & Plan Note (Signed)
Followed by neurosurgery, s/p Gamma knife radiosurgery

## 2021-03-21 NOTE — Assessment & Plan Note (Signed)
Due to gait imbalance Uses cane or walker for support Had left-sided rib fracture due to a fall in 01/2021 -complains of mild left-sided chest pain, will check x-ray of the ribs Advised for proper hydration and avoid skipping any meals

## 2021-03-21 NOTE — Assessment & Plan Note (Signed)
Lab Results  Component Value Date   HGBA1C 6.4 (H) 03/17/2021    On Metformin 500 mg QD Advised to follow diabetic diet F/u CMP and lipid panel Diabetic eye exam: Advised to follow up with Ophthalmology for diabetic eye exam

## 2021-03-21 NOTE — Assessment & Plan Note (Signed)
Takes vitamin D supplement

## 2021-03-21 NOTE — Progress Notes (Addendum)
Established Patient Office Visit  Subjective:  Patient ID: Jessica Russell, female    DOB: 06-05-1927  Age: 87 y.o. MRN: [id number]  CC:  Chief Complaint  Patient presents with   New Patient (Initial Visit)    New patient was seeing dr Anastasio Champion pt needs med refills     HPI Jessica Russell is a 87 y.o. female with past medical history of type II DM with neuropathy, HLD, osteopenia, RA, urinary incontinence, constipation, vestibular schwannoma s/p gamma knife radiosurgery who presents for establishing care.  Type II DM with neuropathy: She takes metformin 500 mg daily.  Her last HbA1C was 6.4.  She takes gabapentin 800 mg 4 times daily for neuropathy and continues to have neuropathic pain in her feet.  She has tried Lyrica in the past, but had irritation/mood swings with it.  She denies any polyuria or polydipsia currently.  She has history of urge incontinence and also uterovaginal prolapse, has a pessary in place.  She is followed by Ob/Gyn.  She has history of RA, for which she takes Methotrexate.  Followed by rheumatology.  She also takes folic acid supplement.  She has history of osteopenia, for which she takes vitamin D daily.  She is followed by neurosurgery for history of vestibular schwannoma.  She has chronic, intermittent headache, but denies any recent change in nature of the headache.  Denies any recent change in vision.  She has chronic hearing loss as well.  She reports history of recurrent falls, last in 01/2021.  She had an ER visit after it, and had left-sided 7th rib fracture.  She complains of mild left sided chest pain, worse with coughing and deep breathing.  She is up-to-date with COVID vaccine.  Past Medical History:  Diagnosis Date   Diabetic nephropathy ([place])    Foot ulcer, right, with fat layer exposed ([place])    non-healing   History of bilateral breast cancer 04-14-2018 per pt no recurrence   dx 2000 w/ right breast cancer, s/p  lumpectomy with node  dissection completed radiation/  dx 2002 w/ left breast cancer, s/p lumpectomy with node dissection,  completed radiation   History of diverticulitis of colon 2009   s/p  partial colectomy for perforated diverticulitis   History of external beam radiation therapy    2000 for right breast cancer;   2002  for left breast cancer  (per pt 36 treatment for each breast)   HLD (hyperlipidemia) 03/24/2017   Neuropathy    OA (osteoarthritis)    Osteoporosis 04/14/2017   Peripheral arterial occlusive disease ([place])    04-13-2018   s/p  balloon angioplasty to the anterior tibial artery for severe stenosis right lower extremity (dr Fletcher Anon)   Personal history of radiation therapy    both breasts   Rheumatoid arthritis ([place]) 03/13/2019   Type 2 diabetes mellitus ([place])    Vestibular schwannoma ([place])     S/p Gamma Knife stereotactic radiosurgery April 2022   Vitamin D deficiency disease 03/13/2019    Past Surgical History:  Procedure Laterality Date   ABDOMINAL AORTOGRAM W/LOWER EXTREMITY N/A 04/13/2018   Procedure: ABDOMINAL AORTOGRAM W/LOWER EXTREMITY;  Surgeon: [name], MD;  Location: [facility];  Service: Cardiovascular;  Laterality: N/A;   APPENDECTOMY  age 22   BREAST LUMPECTOMY Right 2000   BREAST LUMPECTOMY Left 2002   BREAST LUMPECTOMY WITH AXILLARY LYMPH NODE DISSECTION Bilateral right 2000;   left 2002   BUNIONECTOMY Bilateral 1968   CARPAL  TUNNEL RELEASE Right 2017   CATARACT EXTRACTION W/PHACO Right 08/30/2017   Procedure: CATARACT EXTRACTION PHACO AND INTRAOCULAR LENS PLACEMENT RIGHT EYE;  Surgeon: [name], MD;  Location: [facility];  Service: Ophthalmology;  Laterality: Right;  CDE: 13.63   CATARACT EXTRACTION W/PHACO Left 09/20/2017   Procedure: CATARACT EXTRACTION PHACO AND INTRAOCULAR LENS PLACEMENT LEFT EYE;  Surgeon: [name], MD;  Location: [facility];  Service: Ophthalmology;  Laterality: Left;  CDE: 11.25   D & C HYSTEROSCOPY W/ RESECTION POLYP  2017   per pt benign    FOOT SURGERY     removal of two bones   FRACTURE SURGERY Left 05/12/2016   patella fracture,   per pt no retatined hardware   GRAFT APPLICATION N/A 61/01/5092   Procedure: APPLICATION OF STRAVIX GRAFT;  Surgeon: [name], DPM;  Location: [facility];  Service: Podiatry;  Laterality: N/A;   HEMICOLECTOMY  2009   diverticulitis w/ perforation   METATARSAL HEAD EXCISION N/A 04/18/2018   Procedure: FIRST METATARSAL HEAD RECESSION, WOUND DEBRIEDMENT;  Surgeon: [name], DPM;  Location: [facility];  Service: Podiatry;  Laterality: N/A;   NASAL SEPTOPLASTY W/ TURBINOPLASTY Bilateral 01/04/2018   Procedure: NASAL SEPTOPLASTY WITH BILATERAL TURBINATE REDUCTION;  Surgeon: [name], MD;  Location: [facility];  Service: ENT;  Laterality: Bilateral;   PERIPHERAL VASCULAR BALLOON ANGIOPLASTY  04/13/2018   Procedure: PERIPHERAL VASCULAR BALLOON ANGIOPLASTY;  Surgeon: [name], MD;  Location: [facility];  Service: Cardiovascular;;  Anterior Tibial   SKIN GRAFT     left foot    VENTRAL HERNIA REPAIR  01-07-2016    [name], [place]   w/ mesh    Family History  Problem Relation Age of Onset   Diabetes Mother    Heart disease Mother    Arthritis Mother    Hypertension Mother    Miscarriages / [place] Mother    Early death Father    Stroke Father    Alcohol abuse Father    Diabetes Sister    Stroke Sister    Diabetes Brother    Diverticulitis Son    Diabetes Sister    Stroke Sister    Colon cancer Neg Hx     Social History   Socioeconomic History   Marital status: Married    Spouse name: Not on file   Number of children: 1   Years of education: 12   Highest education level: Not on file  Occupational History   Occupation: retired  Tobacco Use   Smoking status: Former    Packs/day: 1.00    Years: 12.00    Pack years: 12.00    Types: Cigarettes    Start date: 05/11/1950    Quit date: 05/12/1963    Years since  quitting: 57.8   Smokeless tobacco: Never  Vaping Use   Vaping Use: Never used  Substance and Sexual Activity   Alcohol use: No   Drug use: No   Sexual activity: Not Currently    Birth control/protection: Post-menopausal  Other Topics Concern   Not on file  Social History Narrative   Lives with husband [name]   One son [name] lives in [place]    Father was a [place] from [place], patient is bilingual   Married for over 65 years.    Caffeine use: 1 cup per day   Right handed   Social Determinants of Health   Financial Resource Strain: Not on file  Food Insecurity:  Not on file  Transportation Needs: Not on file  Physical Activity: Not on file  Stress: Not on file  Social Connections: Not on file  Intimate Partner Violence: Not on file    Outpatient Medications Prior to Visit  Medication Sig Dispense Refill   acetaminophen (TYLENOL) 500 MG tablet Take 1,500 mg by mouth at bedtime.     aspirin EC 81 MG tablet Take 1 tablet (81 mg total) by mouth daily. 90 tablet 3   CALCIUM-MAGNESIUM PO Take by mouth daily.     carboxymethylcellul-glycerin (OPTIVE) 0.5-0.9 % ophthalmic solution Apply to eye.     fluocinonide gel (LIDEX) 8.82 % Apply 1 application topically as needed (mouth sores).   0   folic acid (FOLVITE) 1 MG tablet Take 1 mg by mouth daily.     gabapentin (NEURONTIN) 400 MG capsule Take 2 capsules (800 mg total) by mouth 4 (four) times daily. 720 capsule 1   latanoprost (XALATAN) 0.005 % ophthalmic solution      Menaquinone-7 (VITAMIN K2 PO) Take by mouth daily.     metFORMIN (GLUCOPHAGE) 500 MG tablet Take 1 tablet (500 mg total) by mouth daily. 90 tablet 1   methotrexate (RHEUMATREX) 2.5 MG tablet Take 15 mg by mouth once a week. Caution:Chemotherapy. Protect from light.     Methylcellulose, Laxative, (CITRUCEL PO) Take by mouth at bedtime. 1 tablespoon in 3 oz water     Multiple Vitamins-Minerals (CENTRUM SILVER PO) Take by mouth daily.     Omega-3 Fatty Acids (RA  FISH OIL) 1000 MG CAPS Take by mouth daily.     Polyvinyl Alcohol-Povidone (REFRESH OP) Apply to eye 2 (two) times daily as needed.     Turmeric 1053 MG TABS Take 600 mg by mouth. With black pepper extract     vitamin B-12 (CYANOCOBALAMIN) 500 MCG tablet Take 500 mcg by mouth daily.     Cholecalciferol 125 MCG (5000 UT) TABS Take 1 tablet by mouth daily.     No facility-administered medications prior to visit.    Allergies  Allergen Reactions   Phenergan [Promethazine Hcl] Other (See Comments)    DELIRIUM (HALLUCINATIONS)   Ciprofloxacin Swelling   Levofloxacin Swelling    ROS Review of Systems  Constitutional:  Negative for chills and fever.  HENT:  Negative for congestion, sinus pressure, sinus pain and sore throat.   Eyes:  Negative for pain and discharge.  Respiratory:  Negative for cough and shortness of breath.   Cardiovascular:  Negative for palpitations.       Chest wall pain/rib pain  Gastrointestinal:  Negative for abdominal pain, diarrhea, nausea and vomiting.  Endocrine: Negative for polydipsia and polyuria.  Genitourinary:  Positive for frequency and urgency. Negative for dysuria and hematuria.  Musculoskeletal:  Positive for arthralgias, back pain and gait problem. Negative for neck stiffness.  Skin:  Negative for rash.  Neurological:  Positive for headaches. Negative for dizziness and weakness.  Psychiatric/Behavioral:  Negative for agitation and behavioral problems.      Objective:    Physical Exam Vitals reviewed.  Constitutional:      General: She is not in acute distress.    Appearance: She is not diaphoretic.  HENT:     Head: Normocephalic and atraumatic.     Nose: Nose normal. No congestion.     Mouth/Throat:     Mouth: Mucous membranes are moist.     Pharynx: No posterior oropharyngeal erythema.  Eyes:     General: No scleral icterus.  Extraocular Movements: Extraocular movements intact.  Cardiovascular:     Rate and Rhythm: Normal rate and  regular rhythm.     Pulses: Normal pulses.     Heart sounds: Normal heart sounds. No murmur heard. Pulmonary:     Breath sounds: Normal breath sounds. No wheezing or rales.  Musculoskeletal:     Cervical back: Neck supple. No tenderness.     Right lower leg: No edema.     Left lower leg: No edema.  Skin:    General: Skin is warm.     Findings: No rash.  Neurological:     General: No focal deficit present.     Mental Status: She is alert and oriented to person, place, and time.     Sensory: Sensory deficit (B/l feet) present.     Motor: Weakness (B/l LE - 4/5) present.     Gait: Gait abnormal.  Psychiatric:        Mood and Affect: Mood normal.        Behavior: Behavior normal.    BP 118/68 (BP Location: Left Arm, Patient Position: Sitting, Cuff Size: Normal)   Pulse 60   Resp 18   Ht '5\' 2"'  (1.575 m)   Wt 173 lb 0.6 oz (78.5 kg)   SpO2 99%   BMI 31.65 kg/m  Wt Readings from Last 3 Encounters:  03/17/21 173 lb 0.6 oz (78.5 kg)  10/10/20 170 lb 9.6 oz (77.4 kg)  08/28/20 172 lb 12.8 oz (78.4 kg)    Lab Results  Component Value Date   TSH 1.250 03/17/2021   Lab Results  Component Value Date   WBC 3.8 03/17/2021   HGB 13.1 03/17/2021   HCT 39.8 03/17/2021   MCV 92 03/17/2021   PLT 186 03/17/2021   Lab Results  Component Value Date   NA 139 03/17/2021   K 4.8 03/17/2021   CO2 23 03/17/2021   GLUCOSE 99 03/17/2021   BUN 17 03/17/2021   CREATININE 0.73 03/17/2021   BILITOT 0.3 03/17/2021   ALKPHOS 78 03/17/2021   AST 31 03/17/2021   ALT 21 03/17/2021   PROT 6.8 03/17/2021   ALBUMIN 4.6 03/17/2021   CALCIUM 9.8 03/17/2021   ANIONGAP 13 06/24/2020   EGFR 78 03/17/2021   Lab Results  Component Value Date   CHOL 197 02/07/2020   Lab Results  Component Value Date   HDL 40 (L) 02/07/2020   Lab Results  Component Value Date   [facility]  02/07/2020     Comment:     . LDL cholesterol not calculated. Triglyceride levels greater than 400 mg/dL invalidate  calculated LDL results. . Reference range: <100 . Desirable range <100 mg/dL for primary prevention;   <70 mg/dL for patients with CHD or diabetic patients  with > or = 2 CHD risk factors. [name] LDL-C is now calculated using the Martin-Hopkins  calculation, which is a validated novel method providing  better accuracy than the Friedewald equation in the  estimation of LDL-C.  Cresenciano Genre et al. Annamaria Helling. 0539;767(34): 2061-2068  (http://education.[employer].com/faq/FAQ164)    Lab Results  Component Value Date   TRIG 508 (H) 02/07/2020   Lab Results  Component Value Date   CHOLHDL 4.9 02/07/2020   Lab Results  Component Value Date   HGBA1C 6.4 (H) 03/17/2021      Assessment & Plan:   Problem List Items Addressed This Visit       Endocrine   Diabetic neuropathy ([place])    On gabapentin 800  mg 3 times daily Has tried Lyrica in the past      Relevant Orders   TSH (Completed)   CBC with Differential/Platelet (Completed)   Type 2 diabetes mellitus with neurological complications ([place]) - Primary    Lab Results  Component Value Date   HGBA1C 6.4 (H) 03/17/2021   On Metformin 500 mg QD Advised to follow diabetic diet F/u CMP and lipid panel Diabetic eye exam: Advised to follow up with Ophthalmology for diabetic eye exam       Relevant Orders   Hemoglobin A1c (Completed)   CMP14+EGFR (Completed)     Nervous and Auditory   Vestibular schwannoma ([place])    Followed by neurosurgery, s/p Gamma knife radiosurgery        Musculoskeletal and Integument   Osteopenia    Takes vitamin D supplement      Relevant Orders   VITAMIN D 25 Hydroxy (Vit-D Deficiency, Fractures) (Completed)   Rheumatoid arthritis ([place])    On methotrexate with folic acid Followed by rheumatology        Other   Status post gamma knife treatment   Recurrent falls    Due to gait imbalance Uses cane or walker for support Had left-sided rib fracture due to a fall in 01/2021 -complains of mild  left-sided chest pain, will check x-ray of the ribs Advised for proper hydration and avoid skipping any meals      Relevant Orders   TSH (Completed)   CBC with Differential/Platelet (Completed)   Other Visit Diagnoses     Vitamin D deficiency       Relevant Orders   VITAMIN D 25 Hydroxy (Vit-D Deficiency, Fractures) (Completed)   History of rib fracture       Relevant Orders   DG Ribs Unilateral Left (Completed)       No orders of the defined types were placed in this encounter.   Follow-up: Return in about 6 months (around 09/14/2021).    Lindell Spar, MD

## 2021-03-21 NOTE — Assessment & Plan Note (Signed)
On methotrexate with folic acid Followed by rheumatology

## 2021-03-21 NOTE — Assessment & Plan Note (Signed)
On gabapentin 800 mg 3 times daily Has tried Lyrica in the past

## 2021-04-15 ENCOUNTER — Telehealth: Payer: Self-pay | Admitting: Internal Medicine

## 2021-04-15 ENCOUNTER — Other Ambulatory Visit: Payer: Self-pay | Admitting: *Deleted

## 2021-04-15 DIAGNOSIS — E1142 Type 2 diabetes mellitus with diabetic polyneuropathy: Secondary | ICD-10-CM

## 2021-04-15 MED ORDER — GABAPENTIN 400 MG PO CAPS: 800.0000 mg | ORAL_CAPSULE | Freq: Four times a day (QID) | ORAL | 1 refills | Status: DC

## 2021-04-15 NOTE — Telephone Encounter (Signed)
Pt medication sent to pharmacy  

## 2021-04-15 NOTE — Telephone Encounter (Signed)
Came by the office In regard to prescription refills    Pt states that pharm need provider authorization to refill meds   Pt needs all meds sent in to [facility] [address]   Pt needs refill of gabapentin (NEURONTIN) 400 MG capsule

## 2021-04-26 ENCOUNTER — Other Ambulatory Visit (INDEPENDENT_AMBULATORY_CARE_PROVIDER_SITE_OTHER): Payer: Self-pay | Admitting: Internal Medicine

## 2021-04-29 ENCOUNTER — Ambulatory Visit (INDEPENDENT_AMBULATORY_CARE_PROVIDER_SITE_OTHER): Payer: Medicare HMO | Admitting: Nurse Practitioner

## 2021-04-29 ENCOUNTER — Encounter: Payer: Self-pay | Admitting: Nurse Practitioner

## 2021-04-29 ENCOUNTER — Other Ambulatory Visit: Payer: Self-pay

## 2021-04-29 VITALS — BP 136/78 | HR 81 | Ht 62.0 in | Wt 162.0 lb

## 2021-04-29 DIAGNOSIS — N644 Mastodynia: Secondary | ICD-10-CM | POA: Diagnosis not present

## 2021-04-29 MED ORDER — IBUPROFEN 600 MG PO TABS: 600.0000 mg | ORAL_TABLET | Freq: Two times a day (BID) | ORAL | 0 refills | Status: DC | PRN

## 2021-04-29 NOTE — Progress Notes (Signed)
Acute Office Visit  Subjective:    Patient ID: Jessica Russell, female    DOB: 16-Nov-1927, 87 y.o.   MRN: [id number]  Chief Complaint  Patient presents with   Breast Pain    Left breast pain since Saturday 12/17. No SOB or trouble breathing.     HPI Patient is in today for Jessica Russell is here for c/o shooting pain 8/10 in her left breast that started 4 days ago. Pain is currently 2/10. Stated that her pain is getting better. Pain is worse with movement. She has been doing Therapist, art but she did not lift anything heavy. Last mammogram was normal this years. She had a cracked rib in September, she had an xray Russell on Jessica Russell.whish showed that fracture had resolved. She has not gone back to the chiropractor. Takes 2-3 tylenol for RA. Has history of bilateral breast CA.  Past Medical History:  Diagnosis Date   Diabetic nephropathy ([place])    Foot ulcer, right, with fat layer exposed ([place])    non-healing   History of bilateral breast cancer 04-14-2018 per pt no recurrence   dx 2000 w/ right breast cancer, s/p  lumpectomy with node dissection completed radiation/  dx 2002 w/ left breast cancer, s/p lumpectomy with node dissection,  completed radiation   History of diverticulitis of colon 2009   s/p  partial colectomy for perforated diverticulitis   History of external beam radiation therapy    2000 for right breast cancer;   2002  for left breast cancer  (per pt 36 treatment for each breast)   HLD (hyperlipidemia) 03/24/2017   Neuropathy    OA (osteoarthritis)    Osteoporosis 04/14/2017   Peripheral arterial occlusive disease ([place])    04-13-2018   s/p  balloon angioplasty to the anterior tibial artery for severe stenosis right lower extremity (Jessica Russell)   Personal history of radiation therapy    both breasts   Rheumatoid arthritis ([place]) 03/13/2019   Type 2 diabetes mellitus ([place])    Vestibular schwannoma ([place])     S/p Gamma Knife stereotactic radiosurgery April 2022    Vitamin D deficiency disease 03/13/2019    Past Surgical History:  Procedure Laterality Date   ABDOMINAL AORTOGRAM W/LOWER EXTREMITY N/A 04/13/2018   Procedure: ABDOMINAL AORTOGRAM W/LOWER EXTREMITY;  Surgeon: Jessica Hampshire, MD;  Location: [facility];  Service: Cardiovascular;  Laterality: N/A;   APPENDECTOMY  age 23   BREAST LUMPECTOMY Right 2000   BREAST LUMPECTOMY Left 2002   BREAST LUMPECTOMY WITH AXILLARY LYMPH NODE DISSECTION Bilateral right 2000;   left 2002   BUNIONECTOMY Bilateral 1968   CARPAL TUNNEL RELEASE Right 2017   CATARACT EXTRACTION W/PHACO Right 08/30/2017   Procedure: CATARACT EXTRACTION PHACO AND INTRAOCULAR LENS PLACEMENT RIGHT EYE;  Surgeon: [name], MD;  Location: [facility];  Service: Ophthalmology;  Laterality: Right;  CDE: 13.63   CATARACT EXTRACTION W/PHACO Left 09/20/2017   Procedure: CATARACT EXTRACTION PHACO AND INTRAOCULAR LENS PLACEMENT LEFT EYE;  Surgeon: [name], MD;  Location: [facility];  Service: Ophthalmology;  Laterality: Left;  CDE: 11.25   D & C HYSTEROSCOPY W/ RESECTION POLYP  2017   per pt benign   FOOT SURGERY     removal of two bones   FRACTURE SURGERY Left 05/12/2016   patella fracture,   per pt no retatined hardware   GRAFT APPLICATION N/A 13/06/4399   Procedure: APPLICATION OF STRAVIX GRAFT;  Surgeon: Jessica Russell, DPM;  Location: [facility];  Service: Podiatry;  Laterality: N/A;   HEMICOLECTOMY  2009   diverticulitis w/ perforation   METATARSAL HEAD EXCISION N/A 04/18/2018   Procedure: FIRST METATARSAL HEAD RECESSION, WOUND DEBRIEDMENT;  Surgeon: Jessica Russell, DPM;  Location: [facility];  Service: Podiatry;  Laterality: N/A;   NASAL SEPTOPLASTY W/ TURBINOPLASTY Bilateral 01/04/2018   Procedure: NASAL SEPTOPLASTY WITH BILATERAL TURBINATE REDUCTION;  Surgeon: [name], MD;  Location: [facility];  Service: ENT;  Laterality: Bilateral;   PERIPHERAL VASCULAR BALLOON ANGIOPLASTY   04/13/2018   Procedure: PERIPHERAL VASCULAR BALLOON ANGIOPLASTY;  Surgeon: Jessica Hampshire, MD;  Location: [facility];  Service: Cardiovascular;;  Anterior Tibial   SKIN GRAFT     left foot    VENTRAL HERNIA REPAIR  01-07-2016    Jessica Russell, Jessica Russell   w/ mesh    Family History  Problem Relation Age of Onset   Diabetes Mother    Heart disease Mother    Arthritis Mother    Hypertension Mother    Miscarriages / [place] Mother    Early death Father    Stroke Father    Alcohol abuse Father    Diabetes Sister    Stroke Sister    Diabetes Brother    Diverticulitis Son    Diabetes Sister    Stroke Sister    Colon cancer Neg Hx     Social History   Socioeconomic History   Marital status: Married    Spouse name: Not on file   Number of children: 1   Years of education: 12   Highest education level: Not on file  Occupational History   Occupation: retired  Tobacco Use   Smoking status: Former    Packs/day: 1.00    Years: 12.00    Pack years: 12.00    Types: Cigarettes    Start date: 05/11/1950    Quit date: 05/12/1963    Years since quitting: 58.0   Smokeless tobacco: Never  Vaping Use   Vaping Use: Never used  Substance and Sexual Activity   Alcohol use: No   Drug use: No   Sexual activity: Not Currently    Birth control/protection: Post-menopausal  Other Topics Concern   Not on file  Social History Narrative   Lives with husband Jessica Russell   One son Jessica Russell lives in [place]    Father was a [place] from [place], patient is bilingual   Married for over 68 years.    Caffeine use: 1 cup per day   Right handed   Social Determinants of Health   Financial Resource Strain: Not on file  Food Insecurity: Not on file  Transportation Needs: Not on file  Physical Activity: Not on file  Stress: Not on file  Social Connections: Not on file  Intimate Partner Violence: Not on file    Outpatient Medications Prior to Visit  Medication Sig Dispense Refill   acetaminophen  (TYLENOL) 500 MG tablet Take 1,500 mg by mouth at bedtime.     aspirin EC 81 MG tablet Take 1 tablet (81 mg total) by mouth daily. 90 tablet 3   CALCIUM-MAGNESIUM PO Take by mouth daily.     carboxymethylcellul-glycerin (OPTIVE) 0.5-0.9 % ophthalmic solution Apply to eye.     fluocinonide gel (LIDEX) 3.41 % Apply 1 application topically as needed (mouth sores).   0   folic acid (FOLVITE) 1 MG tablet Take 1 mg by mouth daily.     gabapentin (NEURONTIN) 400 MG capsule  Take 2 capsules (800 mg total) by mouth 4 (four) times daily. 720 capsule 1   latanoprost (XALATAN) 0.005 % ophthalmic solution      Menaquinone-7 (VITAMIN K2 PO) Take by mouth daily.     metFORMIN (GLUCOPHAGE) 500 MG tablet TAKE 1 TABLET BY MOUTH EVERY DAY 90 tablet 1   methotrexate (RHEUMATREX) 2.5 MG tablet Take 15 mg by mouth once a week. Caution:Chemotherapy. Protect from light.     Methylcellulose, Laxative, (CITRUCEL PO) Take by mouth at bedtime. 1 tablespoon in 3 oz water     Multiple Vitamins-Minerals (CENTRUM SILVER PO) Take by mouth daily.     MYRBETRIQ 50 MG TB24 tablet Take 50 mg by mouth daily.     Omega-3 Fatty Acids (RA FISH OIL) 1000 MG CAPS Take by mouth daily.     Polyvinyl Alcohol-Povidone (REFRESH OP) Apply to eye 2 (two) times daily as needed.     Turmeric 1053 MG TABS Take 600 mg by mouth. With black pepper extract     vitamin B-12 (CYANOCOBALAMIN) 500 MCG tablet Take 500 mcg by mouth daily.     No facility-administered medications prior to visit.    Allergies  Allergen Reactions   Phenergan [Promethazine Hcl] Other (See Comments)    DELIRIUM (HALLUCINATIONS)   Ciprofloxacin Swelling   Levofloxacin Swelling    Review of Systems  Constitutional: Negative.   Respiratory: Negative.    Cardiovascular: Negative.        Has right breast pain .   Gastrointestinal: Negative.   Skin: Negative.   Psychiatric/Behavioral: Negative.        Objective:    Physical Exam Vitals and nursing note reviewed.  Exam conducted with a chaperone present.  Constitutional:      General: She is not in acute distress.    Appearance: She is obese. She is not ill-appearing, toxic-appearing or diaphoretic.  Cardiovascular:     Rate and Rhythm: Normal rate and regular rhythm.     Heart sounds: No murmur heard.   No friction rub. No gallop.  Pulmonary:     Effort: No respiratory distress.     Breath sounds: No stridor. No wheezing, rhonchi or rales.  Chest:     Chest wall: No tenderness.  Breasts:    Right: Mass present. No swelling, bleeding, inverted nipple, nipple discharge, skin change or tenderness.     Left: Mass and tenderness present. No swelling, bleeding, inverted nipple, nipple discharge or skin change.     Comments: Bilateral breast had scattered fibrous tissue on palpation. Left breast was tender on palpation. Nipples appear normal , no discharge, no skin discoloration.  Musculoskeletal:        General: No swelling, tenderness, deformity or signs of injury. Normal range of motion.     Right lower leg: No edema.     Left lower leg: No edema.  Lymphadenopathy:     Upper Body:     Right upper body: No supraclavicular, axillary or pectoral adenopathy.     Left upper body: No supraclavicular, axillary or pectoral adenopathy.  Skin:    General: Skin is warm and dry.     Capillary Refill: Capillary refill takes 2 to 3 seconds.     Coloration: Skin is not jaundiced or pale.     Findings: No bruising, erythema, lesion or rash.  Neurological:     Mental Status: She is alert.  Psychiatric:        Mood and Affect: Mood normal.  Behavior: Behavior normal.        Thought Content: Thought content normal.        Judgment: Judgment normal.    BP (!) 174/81 (BP Location: Right Arm, Patient Position: Sitting, Cuff Size: Normal)    Pulse 81    Ht '5\' 2"'  (1.575 m)    Wt 162 lb (73.5 kg)    SpO2 97%    BMI 29.63 kg/m  Wt Readings from Last 3 Encounters:  04/29/21 162 lb (73.5 kg)  03/17/21 173 lb  0.6 oz (78.5 kg)  10/10/20 170 lb 9.6 oz (77.4 kg)    Health Maintenance Due  Topic Date Due   Zoster Vaccines- Shingrix (2 of 2) 05/23/2019   URINE MICROALBUMIN  07/25/2020   OPHTHALMOLOGY EXAM  08/23/2020   COVID-19 Vaccine (4 - Booster for Moderna series) 04/16/2021    There are no preventive care reminders to display for this patient.   Lab Results  Component Value Date   TSH 1.250 03/17/2021   Lab Results  Component Value Date   WBC 3.8 03/17/2021   HGB 13.1 03/17/2021   HCT 39.8 03/17/2021   MCV 92 03/17/2021   PLT 186 03/17/2021   Lab Results  Component Value Date   NA 139 03/17/2021   K 4.8 03/17/2021   CO2 23 03/17/2021   GLUCOSE 99 03/17/2021   BUN 17 03/17/2021   CREATININE 0.73 03/17/2021   BILITOT 0.3 03/17/2021   ALKPHOS 78 03/17/2021   AST 31 03/17/2021   ALT 21 03/17/2021   PROT 6.8 03/17/2021   ALBUMIN 4.6 03/17/2021   CALCIUM 9.8 03/17/2021   ANIONGAP 13 06/24/2020   EGFR 78 03/17/2021   Lab Results  Component Value Date   CHOL 197 02/07/2020   Lab Results  Component Value Date   HDL 40 (L) 02/07/2020   Lab Results  Component Value Date   [facility]  02/07/2020     Comment:     . LDL cholesterol not calculated. Triglyceride levels greater than 400 mg/dL invalidate calculated LDL results. . Reference range: <100 . Desirable range <100 mg/dL for primary prevention;   <70 mg/dL for patients with CHD or diabetic patients  with > or = 2 CHD risk factors. [name] LDL-C is now calculated using the Martin-Hopkins  calculation, which is a validated novel method providing  better accuracy than the Friedewald equation in the  estimation of LDL-C.  Cresenciano Genre et al. Annamaria Helling. 6294;765(46): 2061-2068  (http://education.[employer].com/faq/FAQ164)    Lab Results  Component Value Date   TRIG 508 (H) 02/07/2020   Lab Results  Component Value Date   CHOLHDL 4.9 02/07/2020   Lab Results  Component Value Date   HGBA1C 6.4 (H) 03/17/2021        Assessment & Plan:   Problem List Items Addressed This Visit   None    No orders of the defined types were placed in this encounter.    Renee Rival, FNP

## 2021-04-29 NOTE — Assessment & Plan Note (Signed)
°  Pain probably musculoskeletal. Had a normal mammogram this year, had fractured ribs on the left chest in September this year.  Take Ibuprofen 600mg   as needed BID for breast pain.  Pt educated on the need to eat before taking med. Call the office  in 2 weeks if pain gets worse or does not resolved.

## 2021-04-29 NOTE — Patient Instructions (Signed)
Take ibuprofen 600mg  twice daily as needed for your breast pain. Please call the office if your pain does not get better in 2 weeks .   t is important that you exercise regularly at least 30 minutes 5 times a week.  Think about what you will eat, plan ahead. Choose " clean, green, fresh or frozen" over canned, processed or packaged foods which are more sugary, salty and fatty. 70 to 75% of food eaten should be vegetables and fruit. Three meals at set times with snacks allowed between meals, but they must be fruit or vegetables. Aim to eat over a 12 hour period , example 7 am to 7 pm, and STOP after  your last meal of the day. Drink water,generally about 64 ounces per day, no other drink is as healthy. Fruit juice is best enjoyed in a healthy way, by EATING the fruit.  Thanks for choosing [facility], we consider it a privelige to serve you.

## 2021-05-13 ENCOUNTER — Ambulatory Visit (INDEPENDENT_AMBULATORY_CARE_PROVIDER_SITE_OTHER): Payer: Medicare HMO | Admitting: Cardiovascular Disease

## 2021-05-13 ENCOUNTER — Other Ambulatory Visit: Payer: Self-pay

## 2021-05-13 ENCOUNTER — Encounter: Payer: Self-pay | Admitting: Cardiovascular Disease

## 2021-05-13 VITALS — BP 144/68 | HR 64 | Ht 62.0 in | Wt 174.4 lb

## 2021-05-13 DIAGNOSIS — E785 Hyperlipidemia, unspecified: Secondary | ICD-10-CM | POA: Diagnosis not present

## 2021-05-13 DIAGNOSIS — I739 Peripheral vascular disease, unspecified: Secondary | ICD-10-CM

## 2021-05-13 NOTE — Patient Instructions (Signed)

## 2021-05-13 NOTE — Progress Notes (Signed)
Cardiology Office Note   Date:  05/13/2021   ID:  [name], DOB 10/25/1927, MRN [id number]  PCP:  Lindell Spar, MD  Cardiologist:   Kathlyn Sacramento, MD   No chief complaint on file.     History of Present Illness: Jessica Russell is a 88 y.o. female who is here today for follow-up visit regarding peripheral arterial disease. She has known history of diabetes mellitus with peripheral neuropathy, rheumatoid arthritis and hyperlipidemia.  She was referred in 2019 by Dr. Fritzi Mandes for evaluation of peripheral arterial disease and nonhealing ulceration on the right foot. Angiography in December 2019 showed no significant aortoiliac disease.  On the right side, there was severe stenosis in the mid anterior tibial artery with occluded dorsalis pedis distally, occluded TP trunk with reconstitution of the peroneal artery proximally via extensive collaterals from the anterior tibial and reconstitution of the distal posterior tibial artery via collaterals from the peroneal artery.  I performed successful balloon angioplasty to the anterior tibial artery. She ultimately underwent resection of first transmetatarsal bone with complete healing.    She has been doing well with no recent chest pain or worsening dyspnea.  She reports left leg discomfort both at rest and with walking with some swelling.  Previous venous reflux study was abnormal on the left side.  She has no open ulceration.  Past Medical History:  Diagnosis Date   Diabetic nephropathy ([name])    Foot ulcer, right, with fat layer exposed ([place])    non-healing   History of bilateral breast cancer 04-14-2018 per pt no recurrence   dx 2000 w/ right breast cancer, s/p  lumpectomy with node dissection completed radiation/  dx 2002 w/ left breast cancer, s/p lumpectomy with node dissection,  completed radiation   History of diverticulitis of colon 2009   s/p  partial colectomy for perforated diverticulitis   History of external  beam radiation therapy    2000 for right breast cancer;   2002  for left breast cancer  (per pt 36 treatment for each breast)   HLD (hyperlipidemia) 03/24/2017   Neuropathy    OA (osteoarthritis)    Osteoporosis 04/14/2017   Peripheral arterial occlusive disease ([place])    04-13-2018   s/p  balloon angioplasty to the anterior tibial artery for severe stenosis right lower extremity (dr Fletcher Anon)   Personal history of radiation therapy    both breasts   Rheumatoid arthritis ([place]) 03/13/2019   Type 2 diabetes mellitus ([place])    Vestibular schwannoma ([place])     S/p Gamma Knife stereotactic radiosurgery April 2022   Vitamin D deficiency disease 03/13/2019    Past Surgical History:  Procedure Laterality Date   ABDOMINAL AORTOGRAM W/LOWER EXTREMITY N/A 04/13/2018   Procedure: ABDOMINAL AORTOGRAM W/LOWER EXTREMITY;  Surgeon: [name], MD;  Location: [facility];  Service: Cardiovascular;  Laterality: N/A;   APPENDECTOMY  age 83   BREAST LUMPECTOMY Right 2000   BREAST LUMPECTOMY Left 2002   BREAST LUMPECTOMY WITH AXILLARY LYMPH NODE DISSECTION Bilateral right 2000;   left 2002   BUNIONECTOMY Bilateral 1968   CARPAL TUNNEL RELEASE Right 2017   CATARACT EXTRACTION W/PHACO Right 08/30/2017   Procedure: CATARACT EXTRACTION PHACO AND INTRAOCULAR LENS PLACEMENT RIGHT EYE;  Surgeon: [name], MD;  [facility]: [facility];  Service: Ophthalmology;  Laterality: Right;  CDE: 13.63   CATARACT EXTRACTION W/PHACO Left 09/20/2017   Procedure: CATARACT EXTRACTION PHACO AND INTRAOCULAR LENS PLACEMENT LEFT EYE;  Surgeon: Geoffry Paradise,  Levada Dy, MD;  Location: [facility];  Service: Ophthalmology;  Laterality: Left;  CDE: 11.25   D & C HYSTEROSCOPY W/ RESECTION POLYP  2017   per pt benign   FOOT SURGERY     removal of two bones   FRACTURE SURGERY Left 05/12/2016   patella fracture,   per pt no retatined hardware   GRAFT APPLICATION N/A 14/01/7025   Procedure: APPLICATION OF STRAVIX GRAFT;  Surgeon: [name], DPM;   Location: [facility];  Service: Podiatry;  Laterality: N/A;   HEMICOLECTOMY  2009   diverticulitis w/ perforation   METATARSAL HEAD EXCISION N/A 04/18/2018   Procedure: FIRST METATARSAL HEAD RECESSION, WOUND DEBRIEDMENT;  Surgeon: [name], DPM;  Location: [facility];  Service: Podiatry;  Laterality: N/A;   NASAL SEPTOPLASTY W/ TURBINOPLASTY Bilateral 01/04/2018   Procedure: NASAL SEPTOPLASTY WITH BILATERAL TURBINATE REDUCTION;  Surgeon: [name], MD;  Location: [facility];  Service: ENT;  Laterality: Bilateral;   PERIPHERAL VASCULAR BALLOON ANGIOPLASTY  04/13/2018   Procedure: PERIPHERAL VASCULAR BALLOON ANGIOPLASTY;  Surgeon: [name], MD;  Location: [facility];  Service: Cardiovascular;;  Anterior Tibial   SKIN GRAFT     left foot    VENTRAL HERNIA REPAIR  01-07-2016    [name], [place]   w/ mesh     Current Outpatient Medications  Medication Sig Dispense Refill   acetaminophen (TYLENOL) 500 MG tablet Take 1,500 mg by mouth at bedtime.     aspirin EC 81 MG tablet Take 1 tablet (81 mg total) by mouth daily. 90 tablet 3   CALCIUM-MAGNESIUM PO Take by mouth daily.     carboxymethylcellul-glycerin (OPTIVE) 0.5-0.9 % ophthalmic solution Apply to eye.     fluocinonide gel (LIDEX) 3.78 % Apply 1 application topically as needed (mouth sores).   0   folic acid (FOLVITE) 1 MG tablet Take 1 mg by mouth daily.     gabapentin (NEURONTIN) 400 MG capsule Take 2 capsules (800 mg total) by mouth 4 (four) times daily. 720 capsule 1   ibuprofen (ADVIL) 600 MG tablet Take 1 tablet (600 mg total) by mouth 2 (two) times daily as needed. 20 tablet 0   latanoprost (XALATAN) 0.005 % ophthalmic solution      Menaquinone-7 (VITAMIN K2 PO) Take by mouth daily.     metFORMIN (GLUCOPHAGE) 500 MG tablet TAKE 1 TABLET BY MOUTH EVERY DAY 90 tablet 1   methotrexate (RHEUMATREX) 2.5 MG tablet Take 15 mg by mouth once a week. Caution:Chemotherapy. Protect  from light.     Methylcellulose, Laxative, (CITRUCEL PO) Take by mouth at bedtime. 1 tablespoon in 3 oz water     Multiple Vitamins-Minerals (CENTRUM SILVER PO) Take by mouth daily.     MYRBETRIQ 50 MG TB24 tablet Take 50 mg by mouth daily.     Omega-3 Fatty Acids (RA FISH OIL) 1000 MG CAPS Take by mouth daily.     Polyvinyl Alcohol-Povidone (REFRESH OP) Apply to eye 2 (two) times daily as needed.     Turmeric 1053 MG TABS Take 600 mg by mouth. With black pepper extract     vitamin B-12 (CYANOCOBALAMIN) 500 MCG tablet Take 500 mcg by mouth daily.     No current facility-administered medications for this visit.    Allergies:   Phenergan [promethazine hcl], Ciprofloxacin, and Levofloxacin    Social History:  The patient  reports that she quit smoking about 58 years ago. Her smoking use included cigarettes. She  started smoking about 71 years ago. She has a 12.00 pack-year smoking history. She has never used smokeless tobacco. She reports that she does not drink alcohol and does not use drugs.   Family History:  The patient's family history includes Alcohol abuse in her father; Arthritis in her mother; Diabetes in her brother, mother, sister, and sister; Diverticulitis in her son; Early death in her father; Heart disease in her mother; Hypertension in her mother; Miscarriages / Stillbirths in her mother; Stroke in her father, sister, and sister.    ROS:  Please see the history of present illness.   Otherwise, review of systems are positive for none.   All other systems are reviewed and negative.    PHYSICAL EXAM: VS:  BP (!) 144/68    Pulse 64    Ht 5\' 2"  (1.575 m)    Wt 174 lb 6.4 oz (79.1 kg)    SpO2 99%    BMI 31.90 kg/m  , BMI Body mass index is 31.9 kg/m. GEN: Well nourished, well developed, in no acute distress  HEENT: normal  Neck: no JVD, carotid bruits, or masses Cardiac: RRR; no  rubs, or gallops,no edema .  2 out of 6 systolic murmur in the aortic area which is early  peaking. Respiratory:  clear to auscultation bilaterally, normal work of breathing GI: soft, nontender, nondistended, + BS MS: no deformity or atrophy  Skin: warm and dry, no rash Neuro:  Strength and sensation are intact Psych: euthymic mood, full affect Vascular: Femoral pulses normal bilaterally.  Dorsalis pedis is palpable on the right side and absent on the left side.  Posterior tibial is absent on the right side but palpable on the left.     EKG:  EKG is ordered today. EKG showed normal sinus rhythm with no significant ST or T wave changes.   Recent Labs: 10/10/2020: Magnesium 1.9 03/17/2021: ALT 21; BUN 17; Creatinine, Ser 0.73; Hemoglobin 13.1; Platelets 186; Potassium 4.8; Sodium 139; TSH 1.250    Lipid Panel    Component Value Date/Time   CHOL 197 02/07/2020 1410   TRIG 508 (H) 02/07/2020 1410   HDL 40 (L) 02/07/2020 1410   CHOLHDL 4.9 02/07/2020 1410   LDLCALC  02/07/2020 1410     Comment:     . LDL cholesterol not calculated. Triglyceride levels greater than 400 mg/dL invalidate calculated LDL results. . Reference range: <100 . Desirable range <100 mg/dL for primary prevention;   <70 mg/dL for patients with CHD or diabetic patients  with > or = 2 CHD risk factors. [name] LDL-C is now calculated using the Martin-Hopkins  calculation, which is a validated novel method providing  better accuracy than the Friedewald equation in the  estimation of LDL-C.  Cresenciano Genre et al. Annamaria Helling. 1610;960(45): 2061-2068  (http://education.[facility].com/faq/FAQ164)       Wt Readings from Last 3 Encounters:  05/13/21 174 lb 6.4 oz (79.1 kg)  04/29/21 162 lb (73.5 kg)  03/17/21 173 lb 0.6 oz (78.5 kg)       No flowsheet data found.    ASSESSMENT AND PLAN:  1.  Peripheral arterial disease with previous nonhealing ulceration on the bottom of the right big toe: Status post balloon angioplasty of the right anterior tibial artery.  No recurrent ulceration or claudication.   She has a palpable dorsalis pedis on the right side.  Left leg pain does not seem to be due to peripheral arterial disease but mostly related to chronic venous insufficiency.  She has  a normal posterior tibial pulse on the left side.  2.  Hyperlipidemia: Most recent lipid profile showed significant elevation in triglyceride which was 548.  This is being managed by primary care physician.  Disposition:   FU with me in 12 months  Signed,  Kathlyn Sacramento, MD  05/13/2021 5:17 PM    [place]

## 2021-07-04 ENCOUNTER — Emergency Department (HOSPITAL_COMMUNITY): Payer: Medicare HMO

## 2021-07-04 ENCOUNTER — Encounter (HOSPITAL_COMMUNITY): Payer: Self-pay | Admitting: Emergency Medicine

## 2021-07-04 ENCOUNTER — Other Ambulatory Visit: Payer: Self-pay

## 2021-07-04 ENCOUNTER — Emergency Department (HOSPITAL_COMMUNITY)
Admission: EM | Admit: 2021-07-04 | Discharge: 2021-07-04 | Disposition: A | Discharge status: Home / Self Care | Payer: Medicare HMO | Attending: Emergency Medicine | Admitting: Emergency Medicine

## 2021-07-04 DIAGNOSIS — R079 Chest pain, unspecified: Secondary | ICD-10-CM | POA: Diagnosis present

## 2021-07-04 DIAGNOSIS — R0789 Other chest pain: Secondary | ICD-10-CM | POA: Insufficient documentation

## 2021-07-04 DIAGNOSIS — W228XXA Striking against or struck by other objects, initial encounter: Secondary | ICD-10-CM | POA: Diagnosis not present

## 2021-07-04 DIAGNOSIS — K5730 Diverticulosis of large intestine without perforation or abscess without bleeding: Secondary | ICD-10-CM | POA: Diagnosis not present

## 2021-07-04 DIAGNOSIS — Y92000 Kitchen of unspecified non-institutional (private) residence as  the place of occurrence of the external cause: Secondary | ICD-10-CM | POA: Insufficient documentation

## 2021-07-04 DIAGNOSIS — Z7982 Long term (current) use of aspirin: Secondary | ICD-10-CM | POA: Diagnosis not present

## 2021-07-04 LAB — CBC
HCT: 44.6 % (ref 36.0–46.0)
Hemoglobin: 14.1 g/dL (ref 12.0–15.0)
MCH: 30.4 pg (ref 26.0–34.0)
MCHC: 31.6 g/dL (ref 30.0–36.0)
MCV: 96.1 fL (ref 80.0–100.0)
Platelets: 181 10*3/uL (ref 150–400)
RBC: 4.64 MIL/uL (ref 3.87–5.11)
RDW: 14.4 % (ref 11.5–15.5)
WBC: 7.2 10*3/uL (ref 4.0–10.5)
nRBC: 0 % (ref 0.0–0.2)

## 2021-07-04 LAB — BASIC METABOLIC PANEL
Anion gap: 8 (ref 5–15)
BUN: 26 mg/dL — ABNORMAL HIGH (ref 8–23)
CO2: 26 mmol/L (ref 22–32)
Calcium: 9.4 mg/dL (ref 8.9–10.3)
Chloride: 103 mmol/L (ref 98–111)
Creatinine, Ser: 0.73 mg/dL (ref 0.44–1.00)
GFR, Estimated: >60 mL/min (ref >60)
Glucose, Bld: 109 mg/dL — ABNORMAL HIGH (ref 70–99)
Potassium: 3.7 mmol/L (ref 3.5–5.1)
Sodium: 137 mmol/L (ref 135–145)

## 2021-07-04 LAB — TROPONIN I (HIGH SENSITIVITY): Troponin I (High Sensitivity): 4 ng/L (ref <18)

## 2021-07-04 MED ORDER — IOHEXOL 350 MG/ML SOLN
100.0000 mL | Freq: Once | INTRAVENOUS | Status: AC | PRN
  Administered 2021-07-04: 100 mL via INTRAVENOUS

## 2021-07-04 MED ORDER — FENTANYL CITRATE PF 50 MCG/ML IJ SOSY
25.0000 ug | PREFILLED_SYRINGE | Freq: Once | INTRAMUSCULAR | Status: AC
  Administered 2021-07-04: 25 ug via INTRAVENOUS
  Filled 2021-07-04: qty 1

## 2021-07-04 MED ORDER — KETOROLAC TROMETHAMINE 30 MG/ML IJ SOLN
30.0000 mg | Freq: Once | INTRAMUSCULAR | Status: AC
Start: 2021-07-04 — End: 2021-07-04
  Administered 2021-07-04: 30 mg via INTRAVENOUS
  Filled 2021-07-04: qty 1

## 2021-07-04 NOTE — ED Provider Notes (Signed)
[facility] EMERGENCY DEPARTMENT Provider Note   CSN: [id number] Arrival date & time: 07/04/21  2007     History  Chief Complaint  Patient presents with   Chest Pain    Jessica Russell is a 88 y.o. female with a history of left-sided rib fracture approximately 1 month ago presenting to ED with pain in the left side of her body.  The patient reports that she stumbled in her kitchen and bumped her left ribs into a kitchen counter today, was having some aching near the site of her rib fracture, but then felt better afterwards.  After dinner she abruptly began having a sensation of pain rating down the left side of her body, although with the left side of her chest at the left side of her neck into her head, all the way down the left side of her arm.  It has been ongoing for about 1 to 2 hours.  She has never had this pain before.  It is high intensity.  She denies history of MI or coronary disease or aortic aneurysm or smoking.  She denies any other active symptoms  HPI     Home Medications Prior to Admission medications   Medication Sig Start Date End Date Taking? Authorizing Provider  acetaminophen (TYLENOL) 500 MG tablet Take 1,500 mg by mouth at bedtime.    [provider]  aspirin EC 81 MG tablet Take 1 tablet (81 mg total) by mouth daily. 06/03/18   Kroeger, Lorelee Cover., PA-C  CALCIUM-MAGNESIUM PO Take by mouth daily.    [provider]  carboxymethylcellul-glycerin (OPTIVE) 0.5-0.9 % ophthalmic solution Apply to eye.    [provider]  fluocinonide gel (LIDEX) 4.74 % Apply 1 application topically as needed (mouth sores).  03/14/18   [provider]  folic acid (FOLVITE) 1 MG tablet Take 1 mg by mouth daily.    [provider]  gabapentin (NEURONTIN) 400 MG capsule Take 2 capsules (800 mg total) by mouth 4 (four) times daily. 04/15/21   Lindell Spar, MD  ibuprofen (ADVIL) 600 MG tablet Take 1 tablet (600 mg total) by mouth 2 (two) times  daily as needed. 04/29/21   Renee Rival, FNP  latanoprost (XALATAN) 0.005 % ophthalmic solution  02/21/20   [provider]  Menaquinone-7 (VITAMIN K2 PO) Take by mouth daily.    [provider]  metFORMIN (GLUCOPHAGE) 500 MG tablet TAKE 1 TABLET BY MOUTH EVERY DAY 04/28/21   Lindell Spar, MD  methotrexate (RHEUMATREX) 2.5 MG tablet Take 15 mg by mouth once a week. Caution:Chemotherapy. Protect from light.    [provider]  Methylcellulose, Laxative, (CITRUCEL PO) Take by mouth at bedtime. 1 tablespoon in 3 oz water    [provider]  Multiple Vitamins-Minerals (CENTRUM SILVER PO) Take by mouth daily.    [provider]  MYRBETRIQ 50 MG TB24 tablet Take 50 mg by mouth daily. 04/10/21   [provider]  Omega-3 Fatty Acids (RA FISH OIL) 1000 MG CAPS Take by mouth daily.    [provider]  Polyvinyl Alcohol-Povidone (REFRESH OP) Apply to eye 2 (two) times daily as needed.    [provider]  Turmeric 1053 MG TABS Take 600 mg by mouth. With black pepper extract    [provider]  vitamin B-12 (CYANOCOBALAMIN) 500 MCG tablet Take 500 mcg by mouth daily.    [provider]      Allergies    Phenergan [promethazine  hcl], Ciprofloxacin, and Levofloxacin    Review of Systems   Review of Systems  Physical Exam Updated Vital Signs BP 115/71 (BP Location: Right Arm)    Pulse 70    Temp 98 F (36.7 C) (Oral)    Resp 14    Ht 5\' 2"  (1.575 m)    Wt 75.3 kg    SpO2 99%    BMI 30.36 kg/m  Physical Exam Constitutional:      General: She is not in acute distress. HENT:     Head: Normocephalic and atraumatic.  Eyes:     Conjunctiva/sclera: Conjunctivae normal.     Pupils: Pupils are equal, round, and reactive to light.  Cardiovascular:     Rate and Rhythm: Normal rate and regular rhythm.  Pulmonary:     Effort: Pulmonary effort is normal. No respiratory distress.  Abdominal:     General: There  is no distension.     Tenderness: There is no abdominal tenderness.  Musculoskeletal:     Comments: Left chest wall tenderness on exam  Skin:    General: Skin is warm and dry.  Neurological:     General: No focal deficit present.     Mental Status: She is alert. Mental status is at baseline.  Psychiatric:        Mood and Affect: Mood normal.        Behavior: Behavior normal.    ED Results / Procedures / Treatments   Labs (all labs ordered are listed, but only abnormal results are displayed) Labs Reviewed  BASIC METABOLIC PANEL - Abnormal; Notable for the following components:      Result Value   Glucose, Bld 109 (*)    BUN 26 (*)    All other components within normal limits  CBC  TROPONIN I (HIGH SENSITIVITY)    EKG EKG Interpretation  Date/Time:  Friday July 04 2021 20:18:44 EST Ventricular Rate:  64 PR Interval:  145 QRS Duration: 98 QT Interval:  420 QTC Calculation: 434 R Axis:   -5 Text Interpretation: Sinus rhythm Low voltage, precordial leads Confirmed by Octaviano Glow [phone]) on 07/04/2021 8:37:26 PM  Radiology DG Chest Port 1 View  Result Date: 07/04/2021 CLINICAL DATA:  Concern for pneumothorax, chest pain EXAM: PORTABLE CHEST 1 VIEW COMPARISON:  03/17/2021 rib radiographs. FINDINGS: Normal cardiac and mediastinal contours. Aortic atherosclerosis. No focal pulmonary opacity. Coarsened interstitial markings. No pleural effusion or pneumothorax. No acute osseous abnormality. Surgical clips overlie the bilateral axilla. IMPRESSION: No acute cardiopulmonary process.  No acute osseous abnormality. Electronically Signed   By: Merilyn Baba M.D.   On: 07/04/2021 21:20   CT Angio Chest/Abd/Pel for Dissection W and/or W/WO  Addendum Date: 07/04/2021   ADDENDUM REPORT: 07/04/2021 22:53 ADDENDUM: See separately dictated spine report for additional findings at L2 Electronically Signed   By: Donavan Foil M.D.   On: 07/04/2021 22:53   Result Date: 07/04/2021 CLINICAL  DATA:  Chest pain history rib fracture 1 month ago EXAM: CT ANGIOGRAPHY CHEST, ABDOMEN AND PELVIS TECHNIQUE: Non-contrast CT of the chest was initially obtained. Multidetector CT imaging through the chest, abdomen and pelvis was performed using the standard protocol during bolus administration of intravenous contrast. Multiplanar reconstructed images and MIPs were obtained and reviewed to evaluate the vascular anatomy. RADIATION DOSE REDUCTION: This exam was performed according to the departmental dose-optimization program which includes automated exposure control, adjustment of the mA and/or kV according to patient size and/or use of iterative reconstruction technique. CONTRAST:  139mL  OMNIPAQUE IOHEXOL 350 MG/ML SOLN COMPARISON:  None. FINDINGS: CTA CHEST FINDINGS Cardiovascular: Non contrasted images of the chest demonstrate no acute intramural hematoma. Aorta is nonaneurysmal. Moderate atherosclerosis. No dissection is seen. Mild coronary calcification. Normal cardiac size. No pericardial effusion. Mediastinum/Nodes: Midline trachea. 1.3 cm nodule in the right lobe of the thyroid. Not clinically significant; no follow-up imaging recommended (ref: J Am Coll Radiol. 2015 Feb;12(2): 143-50). No suspicious lymph nodes. Esophagus within normal limits. Lungs/Pleura: Punctate 3 mm right apical lung nodule, series 8, image 35. Subpleural fibrosis and scarring in the left upper lobe. No acute airspace disease, pleural effusion, or pneumothorax. Mild subpleural fibrosis in the right middle lobe as well. Musculoskeletal: No acute osseous abnormality. Review of the MIP images confirms the above findings. CTA ABDOMEN AND PELVIS FINDINGS VASCULAR Aorta: Negative for aneurysm. Moderate severe aortic atherosclerosis. Small aortic ulcer at the distal infrarenal abdominal aorta with mural thrombus but no aneurysm or dissection. No acute occlusive disease. Celiac: Mild origin calcification without significant stenosis. Distal  branch vessels are patent and without aneurysm or dissection SMA: Patent without evidence of aneurysm, dissection, vasculitis or significant stenosis. Renals: Single right and single left renal arteries with calcification at the origin. No high-grade stenosis, aneurysm or occlusion. IMA: Origin calcification.  Distal vessel patency. Inflow: Moderate aortic atherosclerosis. Internal and external iliac arteries are without significant stenosis or aneurysm. No dissection. Bilateral common femoral and visualized proximal superficial femoral arteries are patent Veins: No obvious venous abnormality within the limitations of this arterial phase study. Review of the MIP images confirms the above findings. NON-VASCULAR Hepatobiliary: No focal liver abnormality is seen. No gallstones, gallbladder wall thickening, or biliary dilatation. Pancreas: Unremarkable. No pancreatic ductal dilatation or surrounding inflammatory changes. Spleen: Normal in size without focal abnormality. Adrenals/Urinary Tract: Adrenal glands are within normal limits. Kidneys show no hydronephrosis. The bladder is unremarkable. Stomach/Bowel: The stomach is nonenlarged. No dilated small bowel. Diverticular disease of the colon without acute wall thickening. Postsurgical changes at the sigmoid colon. Nonvisualized appendix but no right lower quadrant inflammation. Lymphatic: No suspicious lymph nodes Reproductive: Uterus and bilateral adnexa are unremarkable. Other: Negative for pelvic effusion or free air. Moderate fat containing supraumbilical ventral hernia. Left paramedian supraumbilical ventral hernia containing fat and anterior wall of transverse colon. Musculoskeletal: No acute osseous abnormality. Review of the MIP images confirms the above findings. IMPRESSION: 1. Negative for acute aortic dissection or aneurysm. Moderate severe aortic atherosclerosis without high-grade stenosis or occlusive disease within the abdomen or pelvis. 2. Diverticular  disease of the colon without acute inflammatory changes 3. Multiple ventral hernias primarily fat containing. An upper ventral hernia contains fat and anterior wall of colon but no evidence for obstruction or incarceration. Electronically Signed: By: Donavan Foil M.D. On: 07/04/2021 22:41    Procedures Procedures    Medications Ordered in ED Medications  fentaNYL (SUBLIMAZE) injection 25 mcg (25 mcg Intravenous Given 07/04/21 2108)  ketorolac (TORADOL) 30 MG/ML injection 30 mg (30 mg Intravenous Given 07/04/21 2108)  iohexol (OMNIPAQUE) 350 MG/ML injection 100 mL (100 mLs Intravenous Contrast Given 07/04/21 2203)    ED Course/ Medical Decision Making/ A&P Clinical Course as of 07/05/21 0951  Fri Jul 04, 2021  2245 IMPRESSION: 1. Negative for acute aortic dissection or aneurysm. Moderate severe aortic atherosclerosis without high-grade stenosis or occlusive disease within the abdomen or pelvis. 2. Diverticular disease of the colon without acute inflammatory changes 3. Multiple ventral hernias primarily fat containing. An upper ventral hernia contains fat and anterior  wall of colon but no evidence for obstruction or incarceration. [MT]  2246 CTA without evidence of acute dissection.  Patient reassessed and pain improved with medications. [MT]    Clinical Course User Index [MT] Harun Brumley, Carola Rhine, MD                           Medical Decision Making Amount and/or Complexity of Data Reviewed Radiology: ordered.  Risk Prescription drug management.   This patient presents to the ED with concern for chest pain, left-sided.  This involves an extensive number of treatment options, and is a complaint that carries with it a high risk of complications and morbidity.  The differential diagnosis includes recurrent rib fracture versus pneumothorax versus atypical ACS versus aortic dissection versus pneumonia versus other   I ordered and personally interpreted labs.  The pertinent results include:   Bmp, CBC, trop unremarkable  I ordered imaging studies including dg chest, CTA dissection study I independently visualized and interpreted imaging which showed no acute dissection, PTX, PNA, of life threatening emergency identified  I agree with the radiologist interpretation  The patient was maintained on a cardiac monitor.  I personally viewed and interpreted the cardiac monitored which showed an underlying rhythm of: NSR  Per my interpretation the patient's ECG shows normal sinus rhythm without acute ischemic sign  I ordered medication including IV fentanyl, IV Toradol for pain I have reviewed the patients home medicines and have made adjustments as needed  After the interventions noted above, I reevaluated the patient and found that they have: improved.  Pain improved  Dispostion:  After consideration of the diagnostic results and the patients response to treatment, I feel that the patent would benefit from PCP f/u.  Lower suspicion for ACS, PE at this time.         Final Clinical Impression(s) / ED Diagnoses Final diagnoses:  Chest pain    Rx / DC Orders ED Discharge Orders     None         Wyvonnia Dusky, MD 07/05/21 [id number]

## 2021-07-04 NOTE — ED Notes (Signed)
Patient transported to CT 

## 2021-07-04 NOTE — ED Notes (Signed)
Lab and Xray at bedside

## 2021-07-04 NOTE — ED Triage Notes (Addendum)
Pt from home comes to ED via [facility]. C/o chest pain with headache and periods of SOB. Previous rib fx about 2 mos ago. Pt hit area this morning and having pain in same area- left side.  Per EMS [facility].

## 2021-07-14 ENCOUNTER — Other Ambulatory Visit: Payer: Self-pay

## 2021-07-14 ENCOUNTER — Ambulatory Visit (INDEPENDENT_AMBULATORY_CARE_PROVIDER_SITE_OTHER): Payer: Medicare HMO

## 2021-07-14 DIAGNOSIS — Z0000 Encounter for general adult medical examination without abnormal findings: Secondary | ICD-10-CM | POA: Diagnosis not present

## 2021-07-14 NOTE — Progress Notes (Signed)
Subjective:   Jessica Russell is a 88 y.o. female who presents for Medicare Annual (Subsequent) preventive examination. I connected with  Jessica Russell on 07/14/21 by a audio enabled telemedicine application and verified that I am speaking with the correct person using two identifiers.  Patient Location: Home  Provider Location: Office/Clinic  I discussed the limitations of evaluation and management by telemedicine. The patient expressed understanding and agreed to proceed.  Review of Systems     Jessica Russell , Thank you for taking time to come for your Medicare Wellness Visit. I appreciate your ongoing commitment to your health goals. Please review the following plan we discussed and let me know if I can assist you in the future.   These are the goals we discussed:  Goals      HEMOGLOBIN A1C < 7.0     Patient Stated     Make it thru the year.        This is a list of the screening recommended for you and due dates:  Health Maintenance  Topic Date Due   Zoster (Shingles) Vaccine (2 of 2) 05/23/2019   Urine Protein Check  07/25/2020   Eye exam for diabetics  08/23/2020   COVID-19 Vaccine (4 - Booster for Moderna series) 04/16/2021   Hemoglobin A1C  09/14/2021   Complete foot exam   03/17/2022   Tetanus Vaccine  11/06/2027   Pneumonia Vaccine  Completed   Flu Shot  Completed   DEXA scan (bone density measurement)  Completed   HPV Vaccine  Aged Out    Cardiac Risk Factors include: advanced age (>28mn, >>63women);diabetes mellitus     Objective:    Today's Vitals   07/14/21 1254 07/14/21 1259  PainSc: 3  3   PainLoc: Abdomen    There is no height or weight on file to calculate BMI.  Advanced Directives 07/14/2021 07/04/2021 07/10/2020 06/24/2020 06/27/2019 04/18/2018 04/13/2018  Does Patient Have a Medical Advance Directive? Yes No Yes No Yes Yes Yes  Type of Advance Directive Living will - [place] - Living will [place]  Does patient want to make changes to medical advance directive? No - Patient declined - No - Patient declined - No - Patient declined - No - Patient declined  Copy of [place] Chart? - - - - - No - copy requested Yes - validated most recent copy scanned in chart (See row information)  Would patient like information on creating a medical advance directive? - - No - Patient declined No - Patient declined - - No - Patient declined    Current Medications (verified) Outpatient Encounter Medications as of 07/14/2021  Medication Sig   acetaminophen (TYLENOL) 500 MG tablet Take 1,500 mg by mouth at bedtime.   aspirin EC 81 MG tablet Take 1 tablet (81 mg total) by mouth daily.   CALCIUM-MAGNESIUM PO Take by mouth daily.   carboxymethylcellul-glycerin (OPTIVE) 0.5-0.9 % ophthalmic solution Apply to eye.   fluocinonide gel (LIDEX) 01.63% Apply 1 application topically as needed (mouth sores).    folic acid (FOLVITE) 1 MG tablet Take 1 mg by mouth daily.   gabapentin (NEURONTIN) 400 MG capsule Take 2 capsules (800 mg total) by mouth 4 (four) times daily.   ibuprofen (ADVIL) 600 MG tablet Take 1 tablet (600 mg total) by mouth 2 (two) times daily as needed.   latanoprost (XALATAN) 0.005 %  ophthalmic solution    Menaquinone-7 (VITAMIN K2 PO) Take by mouth daily.   metFORMIN (GLUCOPHAGE) 500 MG tablet TAKE 1 TABLET BY MOUTH EVERY DAY   methotrexate (RHEUMATREX) 2.5 MG tablet Take 15 mg by mouth once a week. Caution:Chemotherapy. Protect from light.   Methylcellulose, Laxative, (CITRUCEL PO) Take by mouth at bedtime. 1 tablespoon in 3 oz water   Multiple Vitamins-Minerals (CENTRUM SILVER PO) Take by mouth daily.   MYRBETRIQ 50 MG TB24 tablet Take 50 mg by mouth daily.   Omega-3 Fatty Acids (RA FISH OIL) 1000 MG CAPS Take by mouth daily.   Polyvinyl Alcohol-Povidone (REFRESH OP) Apply to eye 2 (two) times daily as needed.    Turmeric 1053 MG TABS Take 600 mg by mouth. With black pepper extract   vitamin B-12 (CYANOCOBALAMIN) 500 MCG tablet Take 500 mcg by mouth daily.   No facility-administered encounter medications on file as of 07/14/2021.    Allergies (verified) Phenergan [promethazine hcl], Ciprofloxacin, and Levofloxacin   History: Past Medical History:  Diagnosis Date   Diabetic nephropathy ([place])    Foot ulcer, right, with fat layer exposed ([employer])    non-healing   History of bilateral breast cancer 04-14-2018 per pt no recurrence   dx 2000 w/ right breast cancer, s/p  lumpectomy with node dissection completed radiation/  dx 2002 w/ left breast cancer, s/p lumpectomy with node dissection,  completed radiation   History of diverticulitis of colon 2009   s/p  partial colectomy for perforated diverticulitis   History of external beam radiation therapy    2000 for right breast cancer;   2002  for left breast cancer  (per pt 36 treatment for each breast)   HLD (hyperlipidemia) 03/24/2017   Neuropathy    OA (osteoarthritis)    Osteoporosis 04/14/2017   Peripheral arterial occlusive disease ([place])    04-13-2018   s/p  balloon angioplasty to the anterior tibial artery for severe stenosis right lower extremity (dr Fletcher Anon)   Personal history of radiation therapy    both breasts   Rheumatoid arthritis ([place]) 03/13/2019   Type 2 diabetes mellitus ([place])    Vestibular schwannoma ([place])     S/p Gamma Knife stereotactic radiosurgery April 2022   Vitamin D deficiency disease 03/13/2019   Past Surgical History:  Procedure Laterality Date   ABDOMINAL AORTOGRAM W/LOWER EXTREMITY N/A 04/13/2018   Procedure: ABDOMINAL AORTOGRAM W/LOWER EXTREMITY;  Surgeon: [name], MD;  Location: [facility];  Service: Cardiovascular;  Laterality: N/A;   APPENDECTOMY  age 62   BREAST LUMPECTOMY Right 2000   BREAST LUMPECTOMY Left 2002   BREAST LUMPECTOMY WITH AXILLARY LYMPH NODE DISSECTION Bilateral right 2000;   left 2002    BUNIONECTOMY Bilateral 1968   CARPAL TUNNEL RELEASE Right 2017   CATARACT EXTRACTION W/PHACO Right 08/30/2017   Procedure: CATARACT EXTRACTION PHACO AND INTRAOCULAR LENS PLACEMENT RIGHT EYE;  Surgeon: [name], MD;  Location: [facility];  Service: Ophthalmology;  Laterality: Right;  CDE: 13.63   CATARACT EXTRACTION W/PHACO Left 09/20/2017   Procedure: CATARACT EXTRACTION PHACO AND INTRAOCULAR LENS PLACEMENT LEFT EYE;  Surgeon: [name], MD;  Location: [facility];  Service: Ophthalmology;  Laterality: Left;  CDE: 11.25   D & C HYSTEROSCOPY W/ RESECTION POLYP  2017   per pt benign   FOOT SURGERY     removal of two bones   FRACTURE SURGERY Left 05/12/2016   patella fracture,   per pt no retatined hardware   GRAFT APPLICATION N/A  04/18/2018   Procedure: APPLICATION OF STRAVIX GRAFT;  Surgeon: [name], DPM;  Location: [facility];  Service: Podiatry;  Laterality: N/A;   HEMICOLECTOMY  2009   diverticulitis w/ perforation   METATARSAL HEAD EXCISION N/A 04/18/2018   Procedure: FIRST METATARSAL HEAD RECESSION, WOUND DEBRIEDMENT;  Surgeon: [name], DPM;  Location: [facility];  Service: Podiatry;  Laterality: N/A;   NASAL SEPTOPLASTY W/ TURBINOPLASTY Bilateral 01/04/2018   Procedure: NASAL SEPTOPLASTY WITH BILATERAL TURBINATE REDUCTION;  Surgeon: [name], MD;  Location: [facility];  Service: ENT;  Laterality: Bilateral;   PERIPHERAL VASCULAR BALLOON ANGIOPLASTY  04/13/2018   Procedure: PERIPHERAL VASCULAR BALLOON ANGIOPLASTY;  Surgeon: [name], MD;  Location: [facility];  Service: Cardiovascular;;  Anterior Tibial   SKIN GRAFT     left foot    VENTRAL HERNIA REPAIR  01-07-2016    [name], [place]   w/ mesh   Family History  Problem Relation Age of Onset   Diabetes Mother    Heart disease Mother    Arthritis Mother    Hypertension Mother    Miscarriages / [place] Mother    Early death Father    Stroke Father     Alcohol abuse Father    Diabetes Sister    Stroke Sister    Diabetes Brother    Diverticulitis Son    Diabetes Sister    Stroke Sister    Colon cancer Neg Hx    Social History   Socioeconomic History   Marital status: Married    Spouse name: Not on file   Number of children: 1   Years of education: 12   Highest education level: Not on file  Occupational History   Occupation: retired  Tobacco Use   Smoking status: Former    Packs/day: 1.00    Years: 12.00    Pack years: 12.00    Types: Cigarettes    Start date: 05/11/1950    Quit date: 05/12/1963    Years since quitting: 58.2   Smokeless tobacco: Never  Vaping Use   Vaping Use: Never used  Substance and Sexual Activity   Alcohol use: No   Drug use: No   Sexual activity: Not Currently    Birth control/protection: Post-menopausal  Other Topics Concern   Not on file  Social History Narrative   Lives with husband [name]   One son [name] lives in [place]    Father was a [place] from [place], patient is bilingual   Married for over 41 years.    Caffeine use: 1 cup per day   Right handed   Social Determinants of Health   Financial Resource Strain: Low Risk    Difficulty of Paying Living Expenses: Not hard at all  Food Insecurity: No Food Insecurity   Worried About Charity fundraiser in the Last Year: Never true   Ran Out of Food in the Last Year: Never true  Transportation Needs: No Transportation Needs   Lack of Transportation (Medical): No   Lack of Transportation (Non-Medical): No  Physical Activity: Inactive   Days of Exercise per Week: 0 days   Minutes of Exercise per Session: 0 min  Stress: No Stress Concern Present   Feeling of Stress : Only a little  Social Connections: Moderately Integrated   Frequency of Communication with Friends and Family: Once a week   Frequency of Social Gatherings with Friends and Family: More than three times a week  Attends Religious Services: More than 4 times per year    Active Member of Clubs or Organizations: No   Attends Archivist Meetings: Never   Marital Status: Married    Tobacco Counseling Counseling given: Not Answered   Clinical Intake:  Pre-visit preparation completed: Yes  Pain : 0-10 Pain Score: 3  Pain Type: Acute pain Pain Location: Abdomen Pain Orientation: Left Pain Descriptors / Indicators: Aching Pain Onset: In the past 7 days Pain Frequency: Several days a week     BMI - recorded: 31.89 Nutritional Status: BMI > 30  Obese Nutritional Risks: None Diabetes: Yes CBG done?: No Did pt. bring in CBG monitor from home?: No  How often do you need to have someone help you when you read instructions, pamphlets, or other written materials from your doctor or pharmacy?: 3 - Sometimes What is the last grade level you completed in school?: 12  Diabetic?yes Nutrition Risk Assessment:  Has the patient had any N/V/D within the last 2 months?  No  Does the patient have any non-healing wounds?  No  Has the patient had any unintentional weight loss or weight gain?  No   Diabetes:  Is the patient diabetic?  Yes  If diabetic, was a CBG obtained today?  No  Did the patient bring in their glucometer from home?  No  How often do you monitor your CBG's? daily.   Financial Strains and Diabetes Management:  Are you having any financial strains with the device, your supplies or your medication? No .  Does the patient want to be seen by Chronic Care Management for management of their diabetes?  No  Would the patient like to be referred to a Nutritionist or for Diabetic Management?  No   Diabetic Exams:  Diabetic Eye Exam: Overdue for diabetic eye exam. Pt has been advised about the importance in completing this exam. Patient advised to call and schedule an eye exam. Diabetic Foot Exam: Completed 03/17/2021    Interpreter Needed?: No      Activities of Daily Living In your present state of health, do you have any  difficulty performing the following activities: 07/14/2021  Hearing? Y  Vision? N  Difficulty concentrating or making decisions? Y  Walking or climbing stairs? Y  Dressing or bathing? N  Doing errands, shopping? Y  Preparing Food and eating ? N  Using the Toilet? N  In the past six months, have you accidently leaked urine? N  Do you have problems with loss of bowel control? N  Managing your Medications? N  Managing your Finances? N  Housekeeping or managing your Housekeeping? N  Some recent data might be hidden    Patient Care Team: Lindell Spar, MD as PCP - General (Internal Medicine) [name], MD as PCP - Cardiology (Cardiology) Gala Romney Cristopher Estimable, MD as Consulting Physician (Gastroenterology)  Indicate any recent Medical Services you may have received from other than [facility] providers in the past year (date may be approximate).     Assessment:   This is a routine wellness examination for Adrian.  Hearing/Vision screen No results found.  Dietary issues and exercise activities discussed: Current Exercise Habits: The patient does not participate in regular exercise at present, Exercise limited by: None identified   Goals Addressed             This Visit's Progress    Patient Stated       Make it thru the year.  Depression Screen PHQ 2/9 Scores 07/14/2021 07/14/2021 04/29/2021 03/17/2021 07/10/2020 07/26/2019 06/27/2019  PHQ - 2 Score 0 0 0 0 0 0 1  PHQ- 9 Score - - - - 0 - -  Exception Documentation - - - - - Medical reason -    Fall Risk Fall Risk  07/14/2021 04/29/2021 03/17/2021 07/10/2020 07/26/2019  Falls in the past year? '1 1 1 '$ 0 0  Number falls in past yr: 1 0 1 - 0  Injury with Fall? '1 1 1 '$ - 0  Risk for fall due to : Impaired balance/gait History of fall(s);Impaired balance/gait;Impaired mobility History of fall(s);Impaired balance/gait;Impaired mobility - No Fall Risks  Follow up Falls evaluation completed Falls evaluation completed;Education  provided;Falls prevention discussed Falls evaluation completed;Education provided;Falls prevention discussed;Follow up appointment - Falls evaluation completed    FALL RISK PREVENTION PERTAINING TO THE HOME:  Any stairs in or around the home? No  If so, are there any without handrails? No  Home free of loose throw rugs in walkways, pet beds, electrical cords, etc? Yes  Adequate lighting in your home to reduce risk of falls? Yes   ASSISTIVE DEVICES UTILIZED TO PREVENT FALLS:  Life alert? Yes  Use of a cane, walker or w/c? Yes  Grab bars in the bathroom? Yes  Shower chair or bench in shower? Yes  Elevated toilet seat or a handicapped toilet? Yes       6CIT Screen 07/14/2021 07/10/2020 06/27/2019  What Year? 0 points 0 points 0 points  What month? 0 points 0 points 0 points  What time? 0 points 0 points 0 points  Count back from 20 0 points 0 points 0 points  Months in reverse 0 points 0 points 0 points  Repeat phrase 0 points 2 points 0 points  Total Score 0 2 0    Immunizations Immunization History  Administered Date(s) Administered   Fluad Quad(high Dose 65+) 02/07/2020   Influenza, High Dose Seasonal PF 02/18/2017   Influenza,inj,Quad PF,6+ Mos 01/19/2018   Influenza-Unspecified 03/11/2017, 03/12/2019, 02/19/2021   Moderna SARS-COV2 Booster Vaccination 08/26/2020, 02/19/2021   Moderna Sars-Covid-2 Vaccination 07/20/2019, 08/23/2019, 03/02/2020   Pneumococcal Conjugate-13 04/14/2013   Pneumococcal Polysaccharide-23 04/20/2005, 01/19/2018   Td 11/05/2017   Zoster Recombinat (Shingrix) 03/28/2019   Zoster, Live 07/17/2010    TDAP status: Up to date  Flu Vaccine status: Up to date  Pneumococcal vaccine status: Up to date  Covid-19 vaccine status: Completed vaccines  Qualifies for Shingles Vaccine? Yes   Zostavax completed Yes   Shingrix Completed?: No.    Education has been provided regarding the importance of this vaccine. Patient has been advised to call insurance  company to determine out of pocket expense if they have not yet received this vaccine. Advised may also receive vaccine at local pharmacy or Health Dept. Verbalized acceptance and understanding.  Screening Tests Health Maintenance  Topic Date Due   Zoster Vaccines- Shingrix (2 of 2) 05/23/2019   URINE MICROALBUMIN  07/25/2020   OPHTHALMOLOGY EXAM  08/23/2020   COVID-19 Vaccine (4 - Booster for Moderna series) 04/16/2021   HEMOGLOBIN A1C  09/14/2021   FOOT EXAM  03/17/2022   TETANUS/TDAP  11/06/2027   Pneumonia Vaccine 28+ Years old  Completed   INFLUENZA VACCINE  Completed   DEXA SCAN  Completed   HPV VACCINES  Aged Out    Health Maintenance  Health Maintenance Due  Topic Date Due   Zoster Vaccines- Shingrix (2 of 2) 05/23/2019   URINE MICROALBUMIN  07/25/2020  OPHTHALMOLOGY EXAM  08/23/2020   COVID-19 Vaccine (4 - Booster for Moderna series) 04/16/2021    Colorectal cancer screening: No longer required.   Mammogram status: No longer required due to age.  Bone Density status: Completed 07/24/20. Results reflect: Bone density results: OSTEOPENIA. Repeat every 2 years.  Lung Cancer Screening: (Low Dose CT Chest recommended if Age 43-80 years, 30 pack-year currently smoking OR have quit w/in 15years.) does not qualify.   Lung Cancer Screening Referral: NO  Additional Screening:  Hepatitis C Screening: does not qualify; Completed   Vision Screening: Recommended annual ophthalmology exams for early detection of glaucoma and other disorders of the eye. Is the patient up to date with their annual eye exam?  No patient has upcoming appt  Who is the provider or what is the name of the office in which the patient attends annual eye exams? Dr Jorja Loa If pt is not established with a provider, would they like to be referred to a provider to establish care? No .   Dental Screening: Recommended annual dental exams for proper oral hygiene  Community Resource Referral / Chronic Care  Management: CRR required this visit?  No   CCM required this visit?  No      Plan:     I have personally reviewed and noted the following in the patients chart:   Medical and social history Use of alcohol, tobacco or illicit drugs  Current medications and supplements including opioid prescriptions.  Functional ability and status Nutritional status Physical activity Advanced directives List of other physicians Hospitalizations, surgeries, and ER visits in previous 12 months Vitals Screenings to include cognitive, depression, and falls Referrals and appointments  In addition, I have reviewed and discussed with patient certain preventive protocols, quality metrics, and best practice recommendations. A written personalized care plan for preventive services as well as general preventive health recommendations were provided to patient.     Quentin Angst, [place]   07/14/2021   Nurse Notes:

## 2021-07-14 NOTE — Patient Instructions (Signed)
Jessica Russell , Thank you for taking time to come for your Medicare Wellness Visit. I appreciate your ongoing commitment to your health goals. Please review the following plan we discussed and let me know if I can assist you in the future.   These are the goals we discussed:  Goals      HEMOGLOBIN A1C < 7.0     Patient Stated     Make it thru the year.        This is a list of the screening recommended for you and due dates:  Health Maintenance  Topic Date Due   Zoster (Shingles) Vaccine (2 of 2) 05/23/2019   Urine Protein Check  07/25/2020   Eye exam for diabetics  08/23/2020   COVID-19 Vaccine (4 - Booster for Moderna series) 04/16/2021   Hemoglobin A1C  09/14/2021   Complete foot exam   03/17/2022   Tetanus Vaccine  11/06/2027   Pneumonia Vaccine  Completed   Flu Shot  Completed   DEXA scan (bone density measurement)  Completed   HPV Vaccine  Aged Out   Jessica Russell , Thank you for taking time to come for your Medicare Wellness Visit. I appreciate your ongoing commitment to your health goals. Please review the following plan we discussed and let me know if I can assist you in the future.   Screening recommendations/referrals: Colonoscopy: Not due Mammogram: Not due Bone Density: Complete Recommended yearly ophthalmology/optometry visit for glaucoma screening and checkup Recommended yearly dental visit for hygiene and checkup  Vaccinations: Influenza vaccine: Complete Pneumococcal vaccine: Complete Tdap vaccine: Complete Shingles vaccine: Due now     Advanced directives: Declined changes   Conditions/risks identified: Diabetes  Next appointment: 1 year   Preventive Care 22 Years and Older, Female Preventive care refers to lifestyle choices and visits with your health care provider that can promote health and wellness. What does preventive care include? A yearly physical exam. This is also called an annual well check. Dental exams once or twice a year. Routine eye  exams. Ask your health care provider how often you should have your eyes checked. Personal lifestyle choices, including: Daily care of your teeth and gums. Regular physical activity. Eating a healthy diet. Avoiding tobacco and drug use. Limiting alcohol use. Practicing safe sex. Taking low-dose aspirin every day. Taking vitamin and mineral supplements as recommended by your health care provider. What happens during an annual well check? The services and screenings done by your health care provider during your annual well check will depend on your age, overall health, lifestyle risk factors, and family history of disease. Counseling  Your health care provider may ask you questions about your: Alcohol use. Tobacco use. Drug use. Emotional well-being. Home and relationship well-being. Sexual activity. Eating habits. History of falls. Memory and ability to understand (cognition). Work and work Statistician. Reproductive health. Screening  You may have the following tests or measurements: Height, weight, and BMI. Blood pressure. Lipid and cholesterol levels. These may be checked every 5 years, or more frequently if you are over 60 years old. Skin check. Lung cancer screening. You may have this screening every year starting at age 41 if you have a 30-pack-year history of smoking and currently smoke or have quit within the past 15 years. Fecal occult blood test (FOBT) of the stool. You may have this test every year starting at age 28. Flexible sigmoidoscopy or colonoscopy. You may have a sigmoidoscopy every 5 years or a colonoscopy every 10 years starting  at age 15. Hepatitis C blood test. Hepatitis B blood test. Sexually transmitted disease (STD) testing. Diabetes screening. This is done by checking your blood sugar (glucose) after you have not eaten for a while (fasting). You may have this done every 1-3 years. Bone density scan. This is done to screen for osteoporosis. You may have  this done starting at age 80. Mammogram. This may be done every 1-2 years. Talk to your health care provider about how often you should have regular mammograms. Talk with your health care provider about your test results, treatment options, and if necessary, the need for more tests. Vaccines  Your health care provider may recommend certain vaccines, such as: Influenza vaccine. This is recommended every year. Tetanus, diphtheria, and acellular pertussis (Tdap, Td) vaccine. You may need a Td booster every 10 years. Zoster vaccine. You may need this after age 68. Pneumococcal 13-valent conjugate (PCV13) vaccine. One dose is recommended after age 101. Pneumococcal polysaccharide (PPSV23) vaccine. One dose is recommended after age 46. Talk to your health care provider about which screenings and vaccines you need and how often you need them. This information is not intended to replace advice given to you by your health care provider. Make sure you discuss any questions you have with your health care provider. Document Released: 05/24/2015 Document Revised: 01/15/2016 Document Reviewed: 02/26/2015 Elsevier Interactive Patient Education  2017 [place] Prevention in the Home Falls can cause injuries. They can happen to people of all ages. There are many things you can do to make your home safe and to help prevent falls. What can I do on the outside of my home? Regularly fix the edges of walkways and driveways and fix any cracks. Remove anything that might make you trip as you walk through a door, such as a raised step or threshold. Trim any bushes or trees on the path to your home. Use bright outdoor lighting. Clear any walking paths of anything that might make someone trip, such as rocks or tools. Regularly check to see if handrails are loose or broken. Make sure that both sides of any steps have handrails. Any raised decks and porches should have guardrails on the edges. Have any leaves,  snow, or ice cleared regularly. Use sand or salt on walking paths during winter. Clean up any spills in your garage right away. This includes oil or grease spills. What can I do in the bathroom? Use night lights. Install grab bars by the toilet and in the tub and shower. Do not use towel bars as grab bars. Use non-skid mats or decals in the tub or shower. If you need to sit down in the shower, use a plastic, non-slip stool. Keep the floor dry. Clean up any water that spills on the floor as soon as it happens. Remove soap buildup in the tub or shower regularly. Attach bath mats securely with double-sided non-slip rug tape. Do not have throw rugs and other things on the floor that can make you trip. What can I do in the bedroom? Use night lights. Make sure that you have a light by your bed that is easy to reach. Do not use any sheets or blankets that are too big for your bed. They should not hang down onto the floor. Have a firm chair that has side arms. You can use this for support while you get dressed. Do not have throw rugs and other things on the floor that can make you trip. What can  I do in the kitchen? Clean up any spills right away. Avoid walking on wet floors. Keep items that you use a lot in easy-to-reach places. If you need to reach something above you, use a strong step stool that has a grab bar. Keep electrical cords out of the way. Do not use floor polish or wax that makes floors slippery. If you must use wax, use non-skid floor wax. Do not have throw rugs and other things on the floor that can make you trip. What can I do with my stairs? Do not leave any items on the stairs. Make sure that there are handrails on both sides of the stairs and use them. Fix handrails that are broken or loose. Make sure that handrails are as long as the stairways. Check any carpeting to make sure that it is firmly attached to the stairs. Fix any carpet that is loose or worn. Avoid having throw  rugs at the top or bottom of the stairs. If you do have throw rugs, attach them to the floor with carpet tape. Make sure that you have a light switch at the top of the stairs and the bottom of the stairs. If you do not have them, ask someone to add them for you. What else can I do to help prevent falls? Wear shoes that: Do not have high heels. Have rubber bottoms. Are comfortable and fit you well. Are closed at the toe. Do not wear sandals. If you use a stepladder: Make sure that it is fully opened. Do not climb a closed stepladder. Make sure that both sides of the stepladder are locked into place. Ask someone to hold it for you, if possible. Clearly mark and make sure that you can see: Any grab bars or handrails. First and last steps. Where the edge of each step is. Use tools that help you move around (mobility aids) if they are needed. These include: Canes. Walkers. Scooters. Crutches. Turn on the lights when you go into a dark area. Replace any light bulbs as soon as they burn out. Set up your furniture so you have a clear path. Avoid moving your furniture around. If any of your floors are uneven, fix them. If there are any pets around you, be aware of where they are. Review your medicines with your doctor. Some medicines can make you feel dizzy. This can increase your chance of falling. Ask your doctor what other things that you can do to help prevent falls. This information is not intended to replace advice given to you by your health care provider. Make sure you discuss any questions you have with your health care provider. Document Released: 02/21/2009 Document Revised: 10/03/2015 Document Reviewed: 06/01/2014 Elsevier Interactive Patient Education  2017 [employer].

## 2021-07-15 ENCOUNTER — Other Ambulatory Visit: Payer: Self-pay | Admitting: Internal Medicine

## 2021-07-15 DIAGNOSIS — E1142 Type 2 diabetes mellitus with diabetic polyneuropathy: Secondary | ICD-10-CM

## 2021-07-16 ENCOUNTER — Encounter (INDEPENDENT_AMBULATORY_CARE_PROVIDER_SITE_OTHER): Payer: 59 | Admitting: Nurse Practitioner

## 2021-07-16 ENCOUNTER — Ambulatory Visit (INDEPENDENT_AMBULATORY_CARE_PROVIDER_SITE_OTHER): Payer: Medicare HMO | Admitting: Internal Medicine

## 2021-07-16 ENCOUNTER — Encounter: Payer: Self-pay | Admitting: Internal Medicine

## 2021-07-16 ENCOUNTER — Other Ambulatory Visit: Payer: Self-pay

## 2021-07-16 VITALS — BP 136/78 | HR 56 | Resp 18 | Ht 61.0 in | Wt 163.8 lb

## 2021-07-16 DIAGNOSIS — E1149 Type 2 diabetes mellitus with other diabetic neurological complication: Secondary | ICD-10-CM

## 2021-07-16 DIAGNOSIS — K5792 Diverticulitis of intestine, part unspecified, without perforation or abscess without bleeding: Secondary | ICD-10-CM | POA: Diagnosis not present

## 2021-07-16 DIAGNOSIS — Z09 Encounter for follow-up examination after completed treatment for conditions other than malignant neoplasm: Secondary | ICD-10-CM | POA: Insufficient documentation

## 2021-07-16 MED ORDER — AMOXICILLIN-POT CLAVULANATE 875-125 MG PO TABS: 1.0000 | ORAL_TABLET | Freq: Two times a day (BID) | ORAL | 0 refills | Status: DC

## 2021-07-16 NOTE — Assessment & Plan Note (Signed)
LLQ abdominal pain with history of diverticulosis ?Advised to follow BRAT diet ?Prescribed Augmentin, advised to start if her symptoms don't improve in 2 days ?

## 2021-07-16 NOTE — Patient Instructions (Signed)
Please start taking Augmentin after 2 days if you have persistent abdominal pain or any new symptoms such as vomiting, diarrhea, fever or chills. ? ?Please continue to take medications as prescribed. ?

## 2021-07-16 NOTE — Progress Notes (Signed)
Acute Office Visit  Subjective:    Patient ID: Jessica Russell, female    DOB: [birth year], 88 y.o.   MRN: [id number]  Chief Complaint  Patient presents with   Follow-up    ER follow up 2 weeks ago stumbled and hit rib on the counter also has pain in her groin for 1 weeks this is better but still hurts     HPI Patient is in today after recent ER visit after a fall at home.  She hit her left side of chest wall over the kitchen counter after stumbling upon the floor.  Denied any head injury.  Denied any LOC.  She had CT angio chest, abdomen and pelvis, which was negative for any acute pathology.  Her EKG was unremarkable.  Today, she states that her rib cage pain is better now.  She complains of LLQ abdominal pain and reports history of diverticulitis in the past.  She currently denies any fever, chills, nausea, vomiting or diarrhea.  Denies any melena or hematochezia currently.  Past Medical History:  Diagnosis Date   Diabetic nephropathy ([place])    Foot ulcer, right, with fat layer exposed ([place])    non-healing   History of bilateral breast cancer 04-14-2018 per pt no recurrence   dx 2000 w/ right breast cancer, s/p  lumpectomy with node dissection completed radiation/  dx 2002 w/ left breast cancer, s/p lumpectomy with node dissection,  completed radiation   History of diverticulitis of colon 2009   s/p  partial colectomy for perforated diverticulitis   History of external beam radiation therapy    2000 for right breast cancer;   2002  for left breast cancer  (per pt 36 treatment for each breast)   HLD (hyperlipidemia) 03/24/2017   Neuropathy    OA (osteoarthritis)    Osteoporosis 04/14/2017   Peripheral arterial occlusive disease ([place])    04-13-2018   s/p  balloon angioplasty to the anterior tibial artery for severe stenosis right lower extremity (dr Fletcher Anon)   Personal history of radiation therapy    both breasts   Rheumatoid arthritis ([place]) 03/13/2019   Type 2 diabetes mellitus  ([place])    Vestibular schwannoma ([place])     S/p Gamma Knife stereotactic radiosurgery April 2022   Vitamin D deficiency disease 03/13/2019    Past Surgical History:  Procedure Laterality Date   ABDOMINAL AORTOGRAM W/LOWER EXTREMITY N/A 04/13/2018   Procedure: ABDOMINAL AORTOGRAM W/LOWER EXTREMITY;  Surgeon: [name], MD;  Location: [facility];  Service: Cardiovascular;  Laterality: N/A;   APPENDECTOMY  age 65   BREAST LUMPECTOMY Right 2000   BREAST LUMPECTOMY Left 2002   BREAST LUMPECTOMY WITH AXILLARY LYMPH NODE DISSECTION Bilateral right 2000;   left 2002   BUNIONECTOMY Bilateral 1968   CARPAL TUNNEL RELEASE Right 2017   CATARACT EXTRACTION W/PHACO Right 08/30/2017   Procedure: CATARACT EXTRACTION PHACO AND INTRAOCULAR LENS PLACEMENT RIGHT EYE;  Surgeon: [name], MD;  Location: [facility];  Service: Ophthalmology;  Laterality: Right;  CDE: 13.63   CATARACT EXTRACTION W/PHACO Left 09/20/2017   Procedure: CATARACT EXTRACTION PHACO AND INTRAOCULAR LENS PLACEMENT LEFT EYE;  Surgeon: [name], MD;  Location: [facility];  Service: Ophthalmology;  Laterality: Left;  CDE: 11.25   D & C HYSTEROSCOPY W/ RESECTION POLYP  2017   per pt benign   FOOT SURGERY     removal of two bones   FRACTURE SURGERY Left 05/12/2016   patella fracture,   per pt  no retatined hardware   GRAFT APPLICATION N/A 96/06/9526   Procedure: APPLICATION OF STRAVIX GRAFT;  Surgeon: [name], DPM;  Location: [facility];  Service: Podiatry;  Laterality: N/A;   HEMICOLECTOMY  2009   diverticulitis w/ perforation   METATARSAL HEAD EXCISION N/A 04/18/2018   Procedure: FIRST METATARSAL HEAD RECESSION, WOUND DEBRIEDMENT;  Surgeon: [name], DPM;  Location: [facility];  Service: Podiatry;  Laterality: N/A;   NASAL SEPTOPLASTY W/ TURBINOPLASTY Bilateral 01/04/2018   Procedure: NASAL SEPTOPLASTY WITH BILATERAL TURBINATE REDUCTION;  Surgeon: [name], MD;  Location: [facility];  Service: ENT;  Laterality: Bilateral;   PERIPHERAL VASCULAR BALLOON ANGIOPLASTY  04/13/2018   Procedure: PERIPHERAL VASCULAR BALLOON ANGIOPLASTY;  Surgeon: [name], MD;  Location: [facility];  Service: Cardiovascular;;  Anterior Tibial   SKIN GRAFT     left foot    VENTRAL HERNIA REPAIR  01-07-2016    [name], [place]   w/ mesh    Family History  Problem Relation Age of Onset   Diabetes Mother    Heart disease Mother    Arthritis Mother    Hypertension Mother    Miscarriages / [place] Mother    Early death Father    Stroke Father    Alcohol abuse Father    Diabetes Sister    Stroke Sister    Diabetes Brother    Diverticulitis Son    Diabetes Sister    Stroke Sister    Colon cancer Neg Hx     Social History   Socioeconomic History   Marital status: Married    Spouse name: Not on file   Number of children: 1   Years of education: 12   Highest education level: Not on file  Occupational History   Occupation: retired  Tobacco Use   Smoking status: Former    Packs/day: 1.00    Years: 12.00    Pack years: 12.00    Types: Cigarettes    Start date: 05/11/1950    Quit date: 05/12/1963    Years since quitting: 58.2   Smokeless tobacco: Never  Vaping Use   Vaping Use: Never used  Substance and Sexual Activity   Alcohol use: No   Drug use: No   Sexual activity: Not Currently    Birth control/protection: Post-menopausal  Other Topics Concern   Not on file  Social History Narrative   Lives with husband [name]   One son [name] lives in [place]    Father was a [place] from [place], patient is bilingual   Married for over 71 years.    Caffeine use: 1 cup per day   Right handed   Social Determinants of Health   Financial Resource Strain: Low Risk    Difficulty of Paying Living Expenses: Not hard at all  Food Insecurity: No Food Insecurity   Worried About Charity fundraiser in the Last Year: Never true   Ran Out of Food in the Last  Year: Never true  Transportation Needs: No Transportation Needs   Lack of Transportation (Medical): No   Lack of Transportation (Non-Medical): No  Physical Activity: Inactive   Days of Exercise per Week: 0 days   Minutes of Exercise per Session: 0 min  Stress: No Stress Concern Present   Feeling of Stress : Only a little  Social Connections: Moderately Integrated   Frequency of Communication with Friends and Family: Once a week   Frequency of Social Gatherings  with Friends and Family: More than three times a week   Attends Religious Services: More than 4 times per year   Active Member of Clubs or Organizations: No   Attends Archivist Meetings: Never   Marital Status: Married  Human resources officer Violence: Not At Risk   Fear of Current or Ex-Partner: No   Emotionally Abused: No   Physically Abused: No   Sexually Abused: No    Outpatient Medications Prior to Visit  Medication Sig Dispense Refill   acetaminophen (TYLENOL) 500 MG tablet Take 1,500 mg by mouth at bedtime.     aspirin EC 81 MG tablet Take 1 tablet (81 mg total) by mouth daily. 90 tablet 3   CALCIUM-MAGNESIUM PO Take by mouth daily.     carboxymethylcellul-glycerin (OPTIVE) 0.5-0.9 % ophthalmic solution Apply to eye.     fluocinonide gel (LIDEX) 8.67 % Apply 1 application topically as needed (mouth sores).   0   folic acid (FOLVITE) 1 MG tablet Take 1 mg by mouth daily.     gabapentin (NEURONTIN) 400 MG capsule TAKE 2 CAPSULES (800 MG TOTAL) BY MOUTH 4 (FOUR) TIMES DAILY. 720 capsule 1   ibuprofen (ADVIL) 600 MG tablet Take 1 tablet (600 mg total) by mouth 2 (two) times daily as needed. 20 tablet 0   latanoprost (XALATAN) 0.005 % ophthalmic solution      Menaquinone-7 (VITAMIN K2 PO) Take by mouth daily.     metFORMIN (GLUCOPHAGE) 500 MG tablet TAKE 1 TABLET BY MOUTH EVERY DAY 90 tablet 1   methotrexate (RHEUMATREX) 2.5 MG tablet Take 15 mg by mouth once a week. Caution:Chemotherapy. Protect from light.      Methylcellulose, Laxative, (CITRUCEL PO) Take by mouth at bedtime. 1 tablespoon in 3 oz water     Multiple Vitamins-Minerals (CENTRUM SILVER PO) Take by mouth daily.     MYRBETRIQ 50 MG TB24 tablet Take 50 mg by mouth daily.     Omega-3 Fatty Acids (RA FISH OIL) 1000 MG CAPS Take by mouth daily.     Polyvinyl Alcohol-Povidone (REFRESH OP) Apply to eye 2 (two) times daily as needed.     Turmeric 1053 MG TABS Take 600 mg by mouth. With black pepper extract     vitamin B-12 (CYANOCOBALAMIN) 500 MCG tablet Take 500 mcg by mouth daily.     No facility-administered medications prior to visit.    Allergies  Allergen Reactions   Phenergan [Promethazine Hcl] Other (See Comments)    DELIRIUM (HALLUCINATIONS)   Ciprofloxacin Swelling   Levofloxacin Swelling    Review of Systems  Constitutional:  Negative for chills and fever.  HENT:  Negative for congestion, sinus pressure, sinus pain and sore throat.   Eyes:  Negative for pain and discharge.  Respiratory:  Negative for cough and shortness of breath.   Cardiovascular:  Negative for palpitations.       Chest wall pain/rib pain  Gastrointestinal:  Positive for abdominal pain. Negative for diarrhea, nausea and vomiting.  Endocrine: Negative for polydipsia and polyuria.  Genitourinary:  Positive for frequency and urgency. Negative for dysuria and hematuria.  Musculoskeletal:  Positive for arthralgias, back pain and gait problem. Negative for neck stiffness.  Skin:  Negative for rash.  Neurological:  Positive for headaches. Negative for dizziness and weakness.  Psychiatric/Behavioral:  Negative for agitation and behavioral problems.       Objective:    Physical Exam Vitals reviewed.  Constitutional:      General: She is not in acute  distress.    Appearance: She is not diaphoretic.  HENT:     Head: Normocephalic and atraumatic.     Nose: Nose normal. No congestion.     Mouth/Throat:     Mouth: Mucous membranes are moist.     Pharynx: No  posterior oropharyngeal erythema.  Eyes:     General: No scleral icterus.    Extraocular Movements: Extraocular movements intact.  Cardiovascular:     Rate and Rhythm: Normal rate and regular rhythm.     Pulses: Normal pulses.     Heart sounds: Normal heart sounds. No murmur heard. Pulmonary:     Breath sounds: Normal breath sounds. No wheezing or rales.  Abdominal:     Palpations: Abdomen is soft.     Tenderness: There is abdominal tenderness (LLQ, mild).  Musculoskeletal:     Cervical back: Neck supple. No tenderness.     Right lower leg: No edema.     Left lower leg: No edema.  Skin:    General: Skin is warm.     Findings: No rash.  Neurological:     General: No focal deficit present.     Mental Status: She is alert and oriented to person, place, and time.     Sensory: Sensory deficit (B/l feet) present.     Motor: Weakness (B/l LE - 4/5) present.     Gait: Gait abnormal.  Psychiatric:        Mood and Affect: Mood normal.        Behavior: Behavior normal.    BP 136/78 (BP Location: Left Arm, Patient Position: Sitting, Cuff Size: Normal)    Pulse (!) 56    Resp 18    Ht '5\' 1"'$  (1.549 m)    Wt 163 lb 12.8 oz (74.3 kg)    SpO2 100%    BMI 30.95 kg/m  Wt Readings from Last 3 Encounters:  07/16/21 163 lb 12.8 oz (74.3 kg)  07/04/21 166 lb (75.3 kg)  05/13/21 174 lb 6.4 oz (79.1 kg)        Assessment & Plan:   Encounter for examination following treatment at hospital ER chart reviewed, including imaging CT chest/abdomen/pelvis negative for acute pathology Had fall at home, with injury to left chest wall, pain now improved  Diverticulitis LLQ abdominal pain with history of diverticulosis Advised to follow BRAT diet Prescribed Augmentin, advised to start if her symptoms don't improve in 2 days     Meds ordered this encounter  Medications   amoxicillin-clavulanate (AUGMENTIN) 875-125 MG tablet    Sig: Take 1 tablet by mouth 2 (two) times daily.    Dispense:  20  tablet    Refill:  0     Graeme Menees Keith Rake, MD

## 2021-07-16 NOTE — Assessment & Plan Note (Signed)
ER chart reviewed, including imaging ?CT chest/abdomen/pelvis negative for acute pathology ?Had fall at home, with injury to left chest wall, pain now improved ?

## 2021-07-18 LAB — MICROALBUMIN, URINE: Microalbumin, Urine: 7.5 ug/mL

## 2021-07-29 IMAGING — MG MM DIGITAL SCREENING BILAT W/ TOMO AND CAD
6 of 10 series · 6 of 30 positions shown · non-contrast
Comparison: Previous exam(s).

CLINICAL DATA: Screening.

EXAM:
DIGITAL SCREENING BILATERAL MAMMOGRAM WITH TOMOSYNTHESIS AND CAD
TECHNIQUE: Bilateral screening digital craniocaudal and mediolateral oblique
mammograms were obtained. Bilateral screening digital breast
tomosynthesis was performed. The images were evaluated with
computer-aided detection.

[R CC synth-2D (1 of 2)]
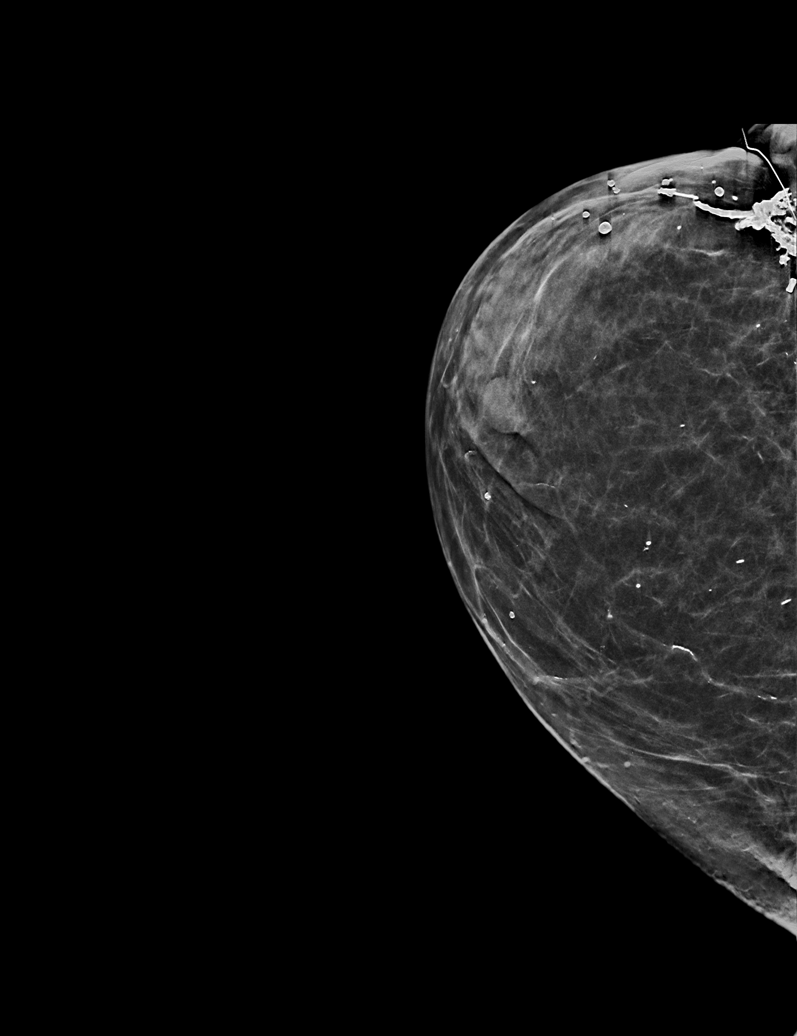

[L MLO synth-2D]
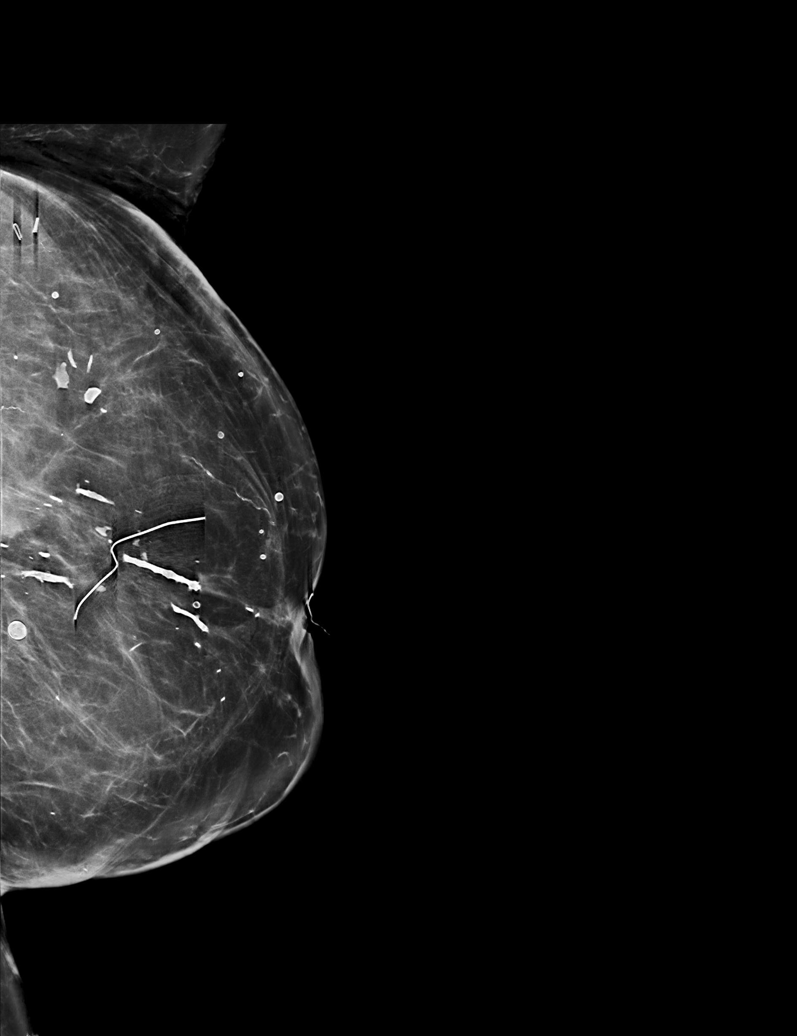

[R CC synth-2D (2 of 2)]
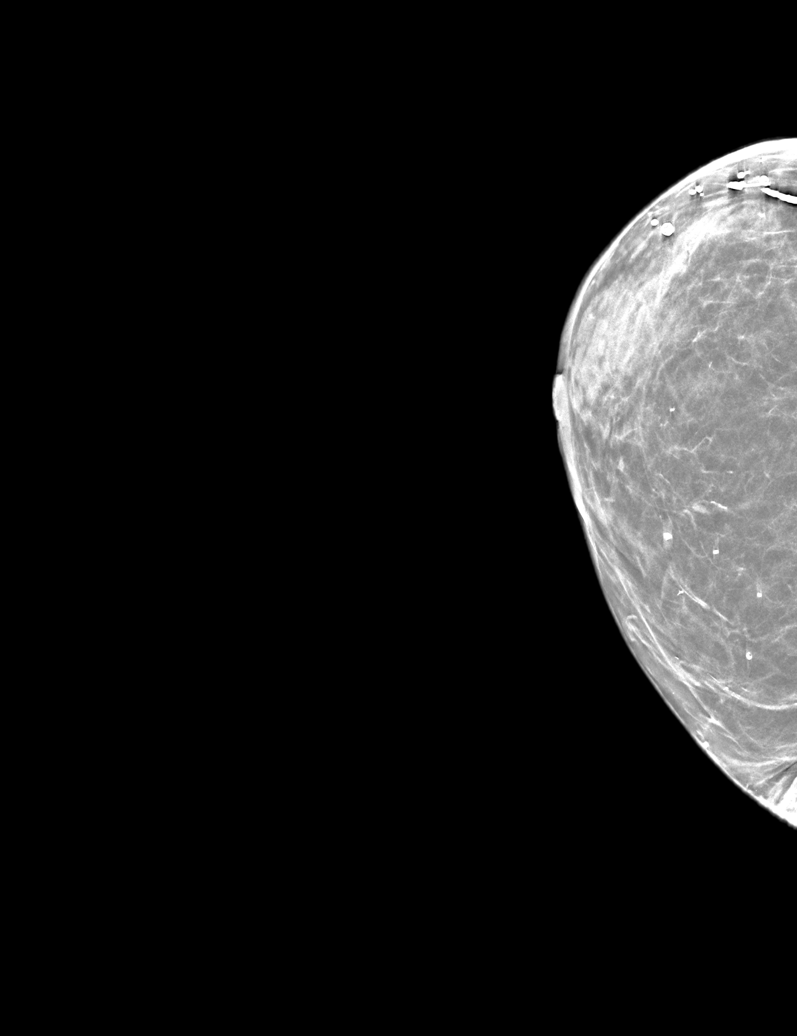

[L CC synth-2D]
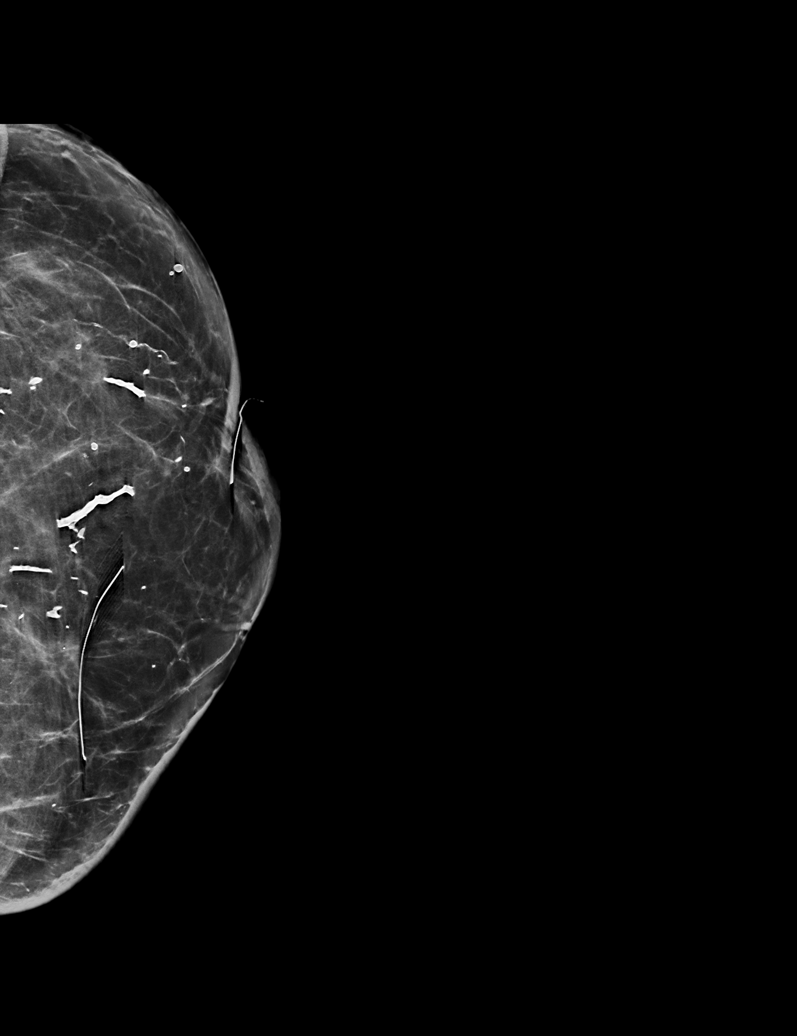

[R MLO synth-2D]
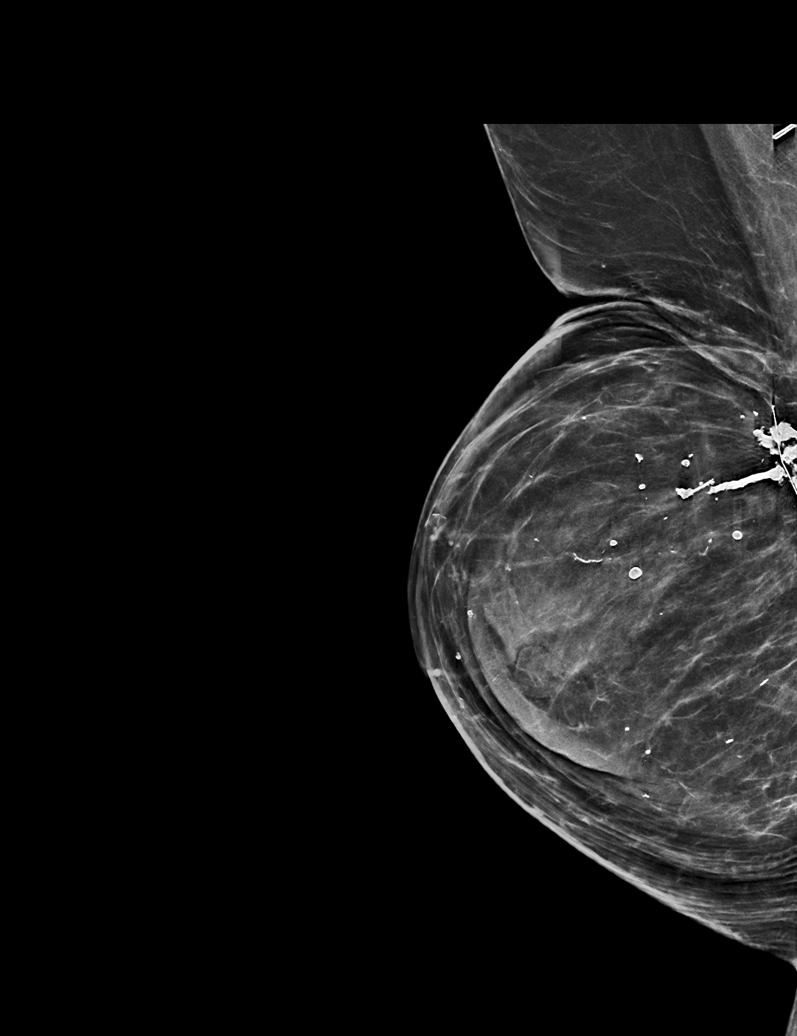

[R CC tomo · tomo slice 26/51.0]
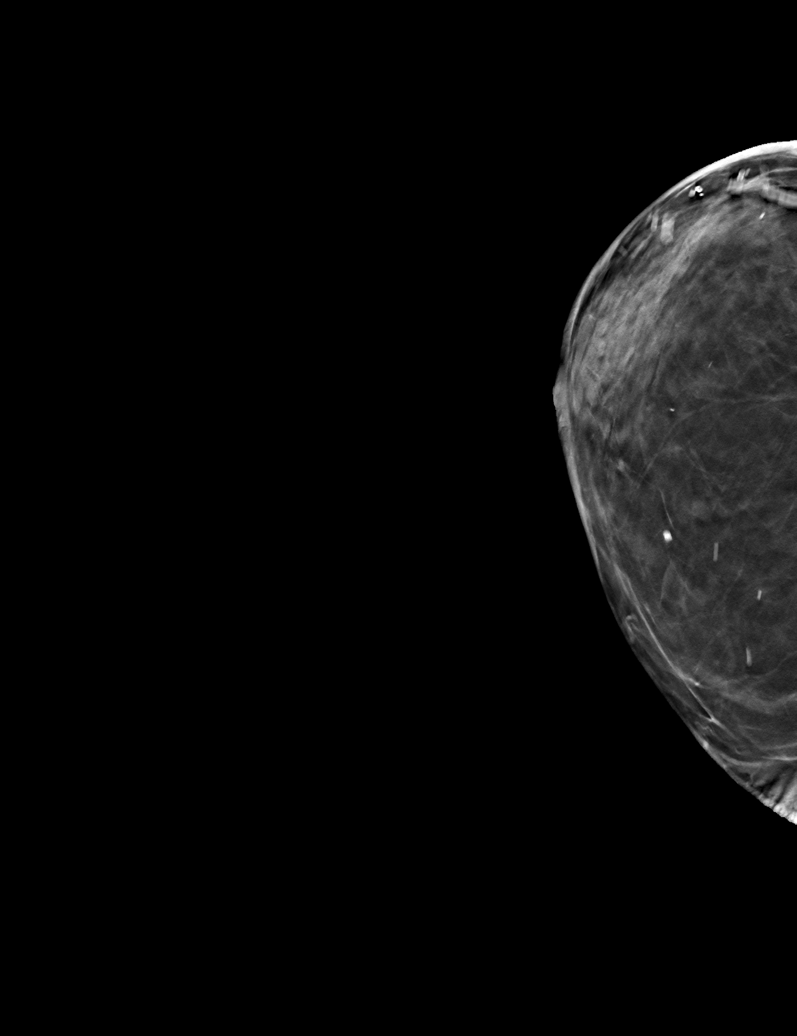

[6 of 30 positions shown; findings below may reference images not displayed]

ACR Breast Density Category b: There are scattered areas of
fibroglandular density.
FINDINGS: There are no findings suspicious for malignancy. The images were
evaluated with computer-aided detection.
IMPRESSION: No mammographic evidence of malignancy. A result letter of this
screening mammogram will be mailed directly to the patient.

RECOMMENDATION:
Screening mammogram in one year. (Code:[id number])

BI-RADS CATEGORY  1: Negative.

## 2021-07-31 ENCOUNTER — Other Ambulatory Visit: Payer: Self-pay | Admitting: *Deleted

## 2021-07-31 DIAGNOSIS — Z1231 Encounter for screening mammogram for malignant neoplasm of breast: Secondary | ICD-10-CM

## 2021-08-12 LAB — HM DIABETES EYE EXAM

## 2021-09-16 ENCOUNTER — Encounter: Payer: Self-pay | Admitting: Internal Medicine

## 2021-09-16 ENCOUNTER — Ambulatory Visit (INDEPENDENT_AMBULATORY_CARE_PROVIDER_SITE_OTHER): Payer: Medicare HMO | Admitting: Internal Medicine

## 2021-09-16 VITALS — BP 132/86 | HR 63 | Resp 18 | Ht 61.0 in | Wt 157.8 lb

## 2021-09-16 DIAGNOSIS — E1149 Type 2 diabetes mellitus with other diabetic neurological complication: Secondary | ICD-10-CM | POA: Diagnosis not present

## 2021-09-16 DIAGNOSIS — E1142 Type 2 diabetes mellitus with diabetic polyneuropathy: Secondary | ICD-10-CM | POA: Diagnosis not present

## 2021-09-16 DIAGNOSIS — Z0000 Encounter for general adult medical examination without abnormal findings: Secondary | ICD-10-CM | POA: Diagnosis not present

## 2021-09-16 DIAGNOSIS — M069 Rheumatoid arthritis, unspecified: Secondary | ICD-10-CM | POA: Diagnosis not present

## 2021-09-16 DIAGNOSIS — M8580 Other specified disorders of bone density and structure, unspecified site: Secondary | ICD-10-CM

## 2021-09-16 LAB — POCT GLYCOSYLATED HEMOGLOBIN (HGB A1C)
HbA1c, POC (controlled diabetic range): 5.9 % (ref 0.0–7.0)
HbA1c, POC (prediabetic range): 5.9 % (ref 5.7–6.4)

## 2021-09-16 NOTE — Assessment & Plan Note (Signed)
Lab Results  ?Component Value Date  ? HGBA1C 5.9 09/16/2021  ? HGBA1C 5.9 09/16/2021  ? ? ?On Metformin 500 mg QD ?Advised to follow diabetic diet ?F/u CMP and lipid panel ?Diabetic eye exam: Advised to follow up with Ophthalmology for diabetic eye exam ?

## 2021-09-16 NOTE — Assessment & Plan Note (Signed)
Takes vitamin D supplement ?

## 2021-09-16 NOTE — Assessment & Plan Note (Signed)
On methotrexate with folic acid ?Followed by rheumatology ?

## 2021-09-16 NOTE — Assessment & Plan Note (Addendum)
On gabapentin 800 mg 4 times daily ?Has tried Lyrica in the past ?Goes to neurology clinic in [place] -provides acupuncture like treatment ?

## 2021-09-16 NOTE — Patient Instructions (Signed)
Please continue taking medications as prescribed.   Please get fasting blood tests done before the next visit. 

## 2021-09-16 NOTE — Progress Notes (Signed)
? ?Established Patient Office Visit ? ?Subjective:  ?Patient ID: Jessica Russell, female    DOB: 04/22/1928  Age: 88 y.o. MRN: [id number] ? ?CC:  ?Chief Complaint  ?Patient presents with  ? Follow-up  ?  6 month follow up pt would like full panel blood work ordered has neuropathy on and off   ? ? ?HPI ?Jessica Russell is a 88 y.o. female with past medical history of type II DM with neuropathy, HLD, osteopenia, RA, urinary incontinence, constipation, vestibular schwannoma s/p gamma knife radiosurgery who presents for f/u of her chronic medical conditions. ? ?Type II DM with neuropathy: She takes metformin 500 mg daily.  Her HbA1C was 5.9 today.  She takes gabapentin 800 mg 4 times daily for neuropathy and continues to have neuropathic pain in her feet.  She has tried Lyrica in the past, but had irritation/mood swings with it.  She denies any polyuria or polydipsia currently. She has been seeing a Neuropathy clinic in [place], and they provide acupuncture-like treatments. ? ?She has history of urge incontinence and also uterovaginal prolapse, has a pessary in place.  She is followed by Ob/Gyn. ?  ?She has history of RA, for which she takes Methotrexate.  Followed by rheumatology.  She also takes folic acid supplement. ?  ?She has history of osteopenia, for which she takes vitamin D daily. ? ? ? ? ?Past Medical History:  ?Diagnosis Date  ? Diabetic nephropathy ([place])   ? Foot ulcer, right, with fat layer exposed ([place])   ? non-healing  ? History of bilateral breast cancer 04-14-2018 per pt no recurrence  ? dx 2000 w/ right breast cancer, s/p  lumpectomy with node dissection completed radiation/  dx 2002 w/ left breast cancer, s/p lumpectomy with node dissection,  completed radiation  ? History of diverticulitis of colon 2009  ? s/p  partial colectomy for perforated diverticulitis  ? History of external beam radiation therapy   ? 2000 for right breast cancer;   2002  for left breast cancer  (per pt 36 treatment  for each breast)  ? HLD (hyperlipidemia) 03/24/2017  ? Neuropathy   ? OA (osteoarthritis)   ? Osteoporosis 04/14/2017  ? Peripheral arterial occlusive disease ([place])   ? 04-13-2018   s/p  balloon angioplasty to the anterior tibial artery for severe stenosis right lower extremity (dr Fletcher Anon)  ? Personal history of radiation therapy   ? both breasts  ? Rheumatoid arthritis ([place]) 03/13/2019  ? Type 2 diabetes mellitus ([place])   ? Vestibular schwannoma ([place])   ?  S/p Gamma Knife stereotactic radiosurgery April 2022  ? Vitamin D deficiency disease 03/13/2019  ? ? ?Past Surgical History:  ?Procedure Laterality Date  ? ABDOMINAL AORTOGRAM W/LOWER EXTREMITY N/A 04/13/2018  ? Procedure: ABDOMINAL AORTOGRAM W/LOWER EXTREMITY;  Surgeon: [name], MD;  Location: [facility];  Service: Cardiovascular;  Laterality: N/A;  ? APPENDECTOMY  age 54  ? BREAST LUMPECTOMY Right 2000  ? BREAST LUMPECTOMY Left 2002  ? BREAST LUMPECTOMY WITH AXILLARY LYMPH NODE DISSECTION Bilateral right 2000;   left 2002  ? BUNIONECTOMY Bilateral 1968  ? CARPAL TUNNEL RELEASE Right 2017  ? CATARACT EXTRACTION W/PHACO Right 08/30/2017  ? Procedure: CATARACT EXTRACTION PHACO AND INTRAOCULAR LENS PLACEMENT RIGHT EYE;  Surgeon: [name], MD;  Location: [facility];  Service: Ophthalmology;  Laterality: Right;  CDE: 13.63  ? CATARACT EXTRACTION W/PHACO Left 09/20/2017  ? Procedure: CATARACT EXTRACTION PHACO AND INTRAOCULAR LENS PLACEMENT LEFT EYE;  Surgeon: [name], MD;  Location: [facility];  Service: Ophthalmology;  Laterality: Left;  CDE: 11.25  ? D & C HYSTEROSCOPY W/ RESECTION POLYP  2017  ? per pt benign  ? FOOT SURGERY    ? removal of two bones  ? FRACTURE SURGERY Left 05/12/2016  ? patella fracture,   per pt no retatined hardware  ? GRAFT APPLICATION N/A 96/11/5914  ? Procedure: APPLICATION OF STRAVIX GRAFT;  Surgeon: [name], DPM;  Location: [facility];  Service: Podiatry;  Laterality: N/A;  ? HEMICOLECTOMY  2009  ?  diverticulitis w/ perforation  ? METATARSAL HEAD EXCISION N/A 04/18/2018  ? Procedure: FIRST METATARSAL HEAD RECESSION, WOUND [place];  Surgeon: [name], DPM;  Location: [facility];  Service: Podiatry;  Laterality: N/A;  ? NASAL SEPTOPLASTY W/ TURBINOPLASTY Bilateral 01/04/2018  ? Procedure: NASAL SEPTOPLASTY WITH BILATERAL TURBINATE REDUCTION;  Surgeon: [name], MD;  Location: [facility];  Service: ENT;  Laterality: Bilateral;  ? PERIPHERAL VASCULAR BALLOON ANGIOPLASTY  04/13/2018  ? Procedure: PERIPHERAL VASCULAR BALLOON ANGIOPLASTY;  Surgeon: [name], MD;  Location: [facility];  Service: Cardiovascular;;  Anterior Tibial  ? SKIN GRAFT    ? left foot   ? VENTRAL HERNIA REPAIR  01-07-2016    [name], [place]  ? w/ mesh  ? ? ?Family History  ?Problem Relation Age of Onset  ? Diabetes Mother   ? Heart disease Mother   ? Arthritis Mother   ? Hypertension Mother   ? Miscarriages / [place] Mother   ? Early death Father   ? Stroke Father   ? Alcohol abuse Father   ? Diabetes Sister   ? Stroke Sister   ? Diabetes Brother   ? Diverticulitis Son   ? Diabetes Sister   ? Stroke Sister   ? Colon cancer Neg Hx   ? ? ?Social History  ? ?Socioeconomic History  ? Marital status: Married  ?  Spouse name: Not on file  ? Number of children: 1  ? Years of education: 47  ? Highest education level: Not on file  ?Occupational History  ? Occupation: retired  ?Tobacco Use  ? Smoking status: Former  ?  Packs/day: 1.00  ?  Years: 12.00  ?  Pack years: 12.00  ?  Types: Cigarettes  ?  Start date: 05/11/1950  ?  Quit date: 05/12/1963  ?  Years since quitting: 58.3  ? Smokeless tobacco: Never  ?Vaping Use  ? Vaping Use: Never used  ?Substance and Sexual Activity  ? Alcohol use: No  ? Drug use: No  ? Sexual activity: Not Currently  ?  Birth control/protection: Post-menopausal  ?Other Topics Concern  ? Not on file  ?Social History Narrative  ? Lives with husband [name]  ? One son [name] lives  in [place]   ? Father was a [place] from [place], patient is bilingual  ? Married for over 79 years.   ? Caffeine use: 1 cup per day  ? Right handed  ? ?Social Determinants of Health  ? ?Financial Resource Strain: Low Risk   ? Difficulty of Paying Living Expenses: Not hard at all  ?Food Insecurity: No Food Insecurity  ? Worried About Charity fundraiser in the Last Year: Never true  ? Ran Out of Food in the Last Year: Never true  ?Transportation Needs: No Transportation Needs  ? Lack of Transportation (Medical): No  ? Lack of Transportation (Non-Medical): No  ?  Physical Activity: Inactive  ? Days of Exercise per Week: 0 days  ? Minutes of Exercise per Session: 0 min  ?Stress: No Stress Concern Present  ? Feeling of Stress : Only a little  ?Social Connections: Moderately Integrated  ? Frequency of Communication with Friends and Family: Once a week  ? Frequency of Social Gatherings with Friends and Family: More than three times a week  ? Attends Religious Services: More than 4 times per year  ? Active Member of Clubs or Organizations: No  ? Attends Archivist Meetings: Never  ? Marital Status: Married  ?Intimate Partner Violence: Not At Risk  ? Fear of Current or Ex-Partner: No  ? Emotionally Abused: No  ? Physically Abused: No  ? Sexually Abused: No  ? ? ?Outpatient Medications Prior to Visit  ?Medication Sig Dispense Refill  ? acetaminophen (TYLENOL) 500 MG tablet Take 1,500 mg by mouth at bedtime.    ? aspirin EC 81 MG tablet Take 1 tablet (81 mg total) by mouth daily. 90 tablet 3  ? CALCIUM-MAGNESIUM PO Take by mouth daily.    ? carboxymethylcellul-glycerin (OPTIVE) 0.5-0.9 % ophthalmic solution Apply to eye.    ? fluocinonide gel (LIDEX) 1.51 % Apply 1 application topically as needed (mouth sores).   0  ? folic acid (FOLVITE) 1 MG tablet Take 1 mg by mouth daily.    ? gabapentin (NEURONTIN) 400 MG capsule TAKE 2 CAPSULES (800 MG TOTAL) BY MOUTH 4 (FOUR) TIMES DAILY. 720 capsule 1  ? latanoprost  (XALATAN) 0.005 % ophthalmic solution     ? Menaquinone-7 (VITAMIN K2 PO) Take by mouth daily.    ? metFORMIN (GLUCOPHAGE) 500 MG tablet TAKE 1 TABLET BY MOUTH EVERY DAY 90 tablet 1  ? methotrexate (RHEUMATREX) 2.5 MG

## 2021-09-22 ENCOUNTER — Ambulatory Visit (HOSPITAL_COMMUNITY)
Admission: RE | Admit: 2021-09-22 | Discharge: 2021-09-22 | Disposition: A | Discharge status: Home / Self Care | Payer: Medicare HMO | Source: Ambulatory Visit | Attending: Internal Medicine | Admitting: Internal Medicine

## 2021-09-22 DIAGNOSIS — Z1231 Encounter for screening mammogram for malignant neoplasm of breast: Secondary | ICD-10-CM | POA: Diagnosis not present

## 2021-09-24 ENCOUNTER — Encounter: Payer: Self-pay | Admitting: *Deleted

## 2021-10-07 ENCOUNTER — Other Ambulatory Visit (INDEPENDENT_AMBULATORY_CARE_PROVIDER_SITE_OTHER): Payer: Self-pay | Admitting: Internal Medicine

## 2022-01-01 ENCOUNTER — Other Ambulatory Visit: Payer: Self-pay | Admitting: Internal Medicine

## 2022-01-01 DIAGNOSIS — E1142 Type 2 diabetes mellitus with diabetic polyneuropathy: Secondary | ICD-10-CM

## 2022-02-11 ENCOUNTER — Ambulatory Visit (INDEPENDENT_AMBULATORY_CARE_PROVIDER_SITE_OTHER): Payer: Medicare HMO

## 2022-02-11 DIAGNOSIS — Z23 Encounter for immunization: Secondary | ICD-10-CM

## 2022-03-17 LAB — CMP14+EGFR
ALT: 24 IU/L (ref 0–32)
AST: 28 IU/L (ref 0–40)
Albumin/Globulin Ratio: 1.7 (ref 1.2–2.2)
Albumin: 4.3 g/dL (ref 3.6–4.6)
Alkaline Phosphatase: 93 IU/L (ref 44–121)
BUN/Creatinine Ratio: 26 (ref 12–28)
BUN: 21 mg/dL (ref 10–36)
Bilirubin Total: 0.3 mg/dL (ref 0.0–1.2)
CO2: 25 mmol/L (ref 20–29)
Calcium: 10.3 mg/dL (ref 8.7–10.3)
Chloride: 101 mmol/L (ref 96–106)
Creatinine, Ser: 0.81 mg/dL (ref 0.57–1.00)
Globulin, Total: 2.5 g/dL (ref 1.5–4.5)
Glucose: 100 mg/dL — ABNORMAL HIGH (ref 70–99)
Potassium: 4.8 mmol/L (ref 3.5–5.2)
Sodium: 139 mmol/L (ref 134–144)
Total Protein: 6.8 g/dL (ref 6.0–8.5)
eGFR: 68 mL/min/{1.73_m2} (ref >59)

## 2022-03-17 LAB — CBC WITH DIFFERENTIAL/PLATELET
Basophils Absolute: 0.1 10*3/uL (ref 0.0–0.2)
Basos: 1 %
EOS (ABSOLUTE): 0.1 10*3/uL (ref 0.0–0.4)
Eos: 3 %
Hematocrit: 42.1 % (ref 34.0–46.6)
Hemoglobin: 13.7 g/dL (ref 11.1–15.9)
Immature Grans (Abs): 0 10*3/uL (ref 0.0–0.1)
Immature Granulocytes: 1 %
Lymphocytes Absolute: 0.8 10*3/uL (ref 0.7–3.1)
Lymphs: 20 %
MCH: 29 pg (ref 26.6–33.0)
MCHC: 32.5 g/dL (ref 31.5–35.7)
MCV: 89 fL (ref 79–97)
Monocytes Absolute: 0.4 10*3/uL (ref 0.1–0.9)
Monocytes: 8 %
Neutrophils Absolute: 2.9 10*3/uL (ref 1.4–7.0)
Neutrophils: 67 %
Platelets: 193 10*3/uL (ref 150–450)
RBC: 4.73 x10E6/uL (ref 3.77–5.28)
RDW: 14.9 % (ref 11.7–15.4)
WBC: 4.3 10*3/uL (ref 3.4–10.8)

## 2022-03-17 LAB — LIPID PANEL
Chol/HDL Ratio: 4.2 ratio (ref 0.0–4.4)
Cholesterol, Total: 197 mg/dL (ref 100–199)
HDL: 47 mg/dL (ref >39)
LDL Chol Calc (NIH): 94 mg/dL (ref 0–99)
Triglycerides: 334 mg/dL — ABNORMAL HIGH (ref 0–149)
VLDL Cholesterol Cal: 56 mg/dL — ABNORMAL HIGH (ref 5–40)

## 2022-03-17 LAB — HEMOGLOBIN A1C
Est. average glucose Bld gHb Est-mCnc: 140 mg/dL
Hgb A1c MFr Bld: 6.5 % — ABNORMAL HIGH (ref 4.8–5.6)

## 2022-03-17 LAB — VITAMIN D 25 HYDROXY (VIT D DEFICIENCY, FRACTURES): Vit D, 25-Hydroxy: 37.2 ng/mL (ref 30.0–100.0)

## 2022-03-17 LAB — TSH: TSH: 1.18 u[IU]/mL (ref 0.450–4.500)

## 2022-03-17 LAB — VITAMIN B12: Vitamin B-12: 970 pg/mL (ref 232–1245)

## 2022-03-23 ENCOUNTER — Encounter: Payer: Medicare HMO | Admitting: Internal Medicine

## 2022-04-07 ENCOUNTER — Ambulatory Visit (INDEPENDENT_AMBULATORY_CARE_PROVIDER_SITE_OTHER): Payer: Medicare HMO | Admitting: Internal Medicine

## 2022-04-07 ENCOUNTER — Encounter: Payer: Self-pay | Admitting: Internal Medicine

## 2022-04-07 VITALS — BP 120/74 | HR 73 | Temp 98.2°F | Resp 16 | Ht 62.0 in | Wt 170.0 lb

## 2022-04-07 DIAGNOSIS — J069 Acute upper respiratory infection, unspecified: Secondary | ICD-10-CM | POA: Diagnosis not present

## 2022-04-07 DIAGNOSIS — J40 Bronchitis, not specified as acute or chronic: Secondary | ICD-10-CM | POA: Insufficient documentation

## 2022-04-07 LAB — POCT INFLUENZA A/B
Influenza A, POC: NEGATIVE
Influenza B, POC: NEGATIVE

## 2022-04-07 MED ORDER — GUAIFENESIN ER 600 MG PO TB12: 600.0000 mg | ORAL_TABLET | Freq: Two times a day (BID) | ORAL | 0 refills | Status: DC | PRN

## 2022-04-07 MED ORDER — AMOXICILLIN-POT CLAVULANATE 875-125 MG PO TABS: 1.0000 | ORAL_TABLET | Freq: Two times a day (BID) | ORAL | 0 refills | Status: AC

## 2022-04-07 MED ORDER — IPRATROPIUM BROMIDE 0.03 % NA SOLN: 2.0000 | Freq: Two times a day (BID) | NASAL | 0 refills | Status: DC | PRN

## 2022-04-07 NOTE — Progress Notes (Signed)
CC: chest congestion (Coughing up whitish yellow mucus, headache, sinus congestion x 1 week. Taking coricedin )    HPI:Jessica Russell is a 88 y.o. female who presents for evaluation of sinus and chest congestion. For the details of today's visit, please refer to the assessment and plan.  Past Medical History:  Diagnosis Date   Diabetic nephropathy ([place])    Foot ulcer, right, with fat layer exposed ([place])    non-healing   History of bilateral breast cancer 04-14-2018 per pt no recurrence   dx 2000 w/ right breast cancer, s/p  lumpectomy with node dissection completed radiation/  dx 2002 w/ left breast cancer, s/p lumpectomy with node dissection,  completed radiation   History of diverticulitis of colon 2009   s/p  partial colectomy for perforated diverticulitis   History of external beam radiation therapy    2000 for right breast cancer;   2002  for left breast cancer  (per pt 36 treatment for each breast)   HLD (hyperlipidemia) 03/24/2017   Neuropathy    OA (osteoarthritis)    Osteoporosis 04/14/2017   Peripheral arterial occlusive disease ([place])    04-13-2018   s/p  balloon angioplasty to the anterior tibial artery for severe stenosis right lower extremity (dr Fletcher Anon)   Personal history of radiation therapy    both breasts   Rheumatoid arthritis ([place]) 03/13/2019   Type 2 diabetes mellitus ([place])    Vestibular schwannoma ([place])     S/p Gamma Knife stereotactic radiosurgery April 2022   Vitamin D deficiency disease 03/13/2019     Physical Exam: Vitals:   04/07/22 1508  BP: 120/74  Pulse: 73  Resp: 16  Temp: 98.2 F (36.8 C)  TempSrc: Oral  SpO2: 94%  Weight: 170 lb (77.1 kg)  Height: '5\' 2"'$  (1.575 m)     Physical Exam HENT:     Right Ear: Decreased hearing (chronic) noted. No drainage. No middle ear effusion.     Left Ear: No drainage.  No middle ear effusion.     Nose: Congestion present.     Right Turbinates: Swollen.     Left Turbinates: Swollen.      Mouth/Throat:     Mouth: Mucous membranes are moist.     Pharynx: Posterior oropharyngeal erythema present. No oropharyngeal exudate.  Cardiovascular:     Rate and Rhythm: Normal rate and regular rhythm.  Pulmonary:     Effort: Pulmonary effort is normal.     Breath sounds: Normal breath sounds. No wheezing, rhonchi or rales.      Assessment & Plan:   URI (upper respiratory infection) Patient presented today with concern for productive cough, headache ,and sinus congestion. Symptoms have mainly been in upper airway , but now feeling some chest congestion as well. This is day 6 of her infection and started after her husband began to have symptoms the week before. She has started a OTC cold medicine with dextromethorphan and chlorpheniramine as active ingredient. Vaccinated for Flu and Covid. Covid test negative at home. POC flu test negative.  Assessment/Plan: Acute problem, prescribing antibiotics given time course and risk at age of 4 for progression. Antibiotics as below. Symptomatic treatment for congestion with guaifenesin and ipratropium nasal spray.  - ipratropium (ATROVENT) 0.03 % nasal spray; Place 2 sprays into both nostrils every 12 (twelve) hours as needed for rhinitis.  Dispense: 30 mL; Refill: 0 - guaiFENesin (MUCINEX) 600 MG 12 hr tablet; Take 1 tablet (600 mg total) by mouth 2 (  two) times daily as needed.  Dispense: 24 tablet; Refill: 0 - amoxicillin-clavulanate (AUGMENTIN) 875-125 MG tablet; Take 1 tablet by mouth 2 (two) times daily for 7 days.  Dispense: 14 tablet; Refill: 0 - POCT Influenza A/B    Lorene Dy, MD

## 2022-04-07 NOTE — Assessment & Plan Note (Signed)
Patient presented today with concern for productive cough, headache ,and sinus congestion. Symptoms have mainly been in upper airway , but now feeling some chest congestion as well. This is day 6 of her infection and started after her husband began to have symptoms the week before. She has started a OTC cold medicine with dextromethorphan and chlorpheniramine as active ingredient. Vaccinated for Flu and Covid. Covid test negative at home. POC flu test negative.  Assessment/Plan: Acute problem, prescribing antibiotics given time course and risk at age of 52 for progression. Antibiotics as below. Symptomatic treatment for congestion with guaifenesin and ipratropium nasal spray.  - ipratropium (ATROVENT) 0.03 % nasal spray; Place 2 sprays into both nostrils every 12 (twelve) hours as needed for rhinitis.  Dispense: 30 mL; Refill: 0 - guaiFENesin (MUCINEX) 600 MG 12 hr tablet; Take 1 tablet (600 mg total) by mouth 2 (two) times daily as needed.  Dispense: 24 tablet; Refill: 0 - amoxicillin-clavulanate (AUGMENTIN) 875-125 MG tablet; Take 1 tablet by mouth 2 (two) times daily for 7 days.  Dispense: 14 tablet; Refill: 0 - POCT Influenza A/B

## 2022-04-07 NOTE — Patient Instructions (Addendum)
Thank you for trusting me with your care. To recap, today we discussed the following:   Upper respiratory tract infection  This will work to decrease mucus production in your nose - ipratropium (ATROVENT) 0.03 % nasal spray; Place 2 sprays into both nostrils every 12 (twelve) hours as needed for rhinitis.  Dispense: 30 mL; Refill: 0  This medication helps thin mucus.- guaiFENesin (MUCINEX) 600 MG 12 hr tablet; Take 1 tablet (600 mg total) by mouth 2 (two) times daily as needed.  Dispense: 24 tablet; Refill: 0  This is an antibiotic- amoxicillin-clavulanate (AUGMENTIN) 875-125 MG tablet; Take 1 tablet by mouth 2 (two) times daily for 7 days.  Dispense: 14 tablet; Refill: 0

## 2022-04-08 ENCOUNTER — Encounter: Payer: Medicare HMO | Admitting: Internal Medicine

## 2022-04-08 ENCOUNTER — Telehealth: Payer: Self-pay | Admitting: Internal Medicine

## 2022-04-08 NOTE — Telephone Encounter (Signed)
Pt requested to change to Dr Doren Custard because him and his wife could not understand Dr Posey Pronto.  They said Dr Posey Pronto is a great Dr but they can't understand him.  Msg from [facility] 04/07/22 - [phone] and husband [phone] just seen Court Joy today and they are patients of Posey Pronto but both are hard of hearing and have a very hard time understanding Dr Posey Pronto and they are asking if they could switch to Dr Doren Custard.

## 2022-04-09 ENCOUNTER — Other Ambulatory Visit (INDEPENDENT_AMBULATORY_CARE_PROVIDER_SITE_OTHER): Payer: Self-pay | Admitting: Internal Medicine

## 2022-04-30 ENCOUNTER — Other Ambulatory Visit: Payer: Self-pay | Admitting: Internal Medicine

## 2022-04-30 DIAGNOSIS — J069 Acute upper respiratory infection, unspecified: Secondary | ICD-10-CM

## 2022-05-07 ENCOUNTER — Encounter: Payer: Self-pay | Admitting: Internal Medicine

## 2022-05-07 ENCOUNTER — Ambulatory Visit (INDEPENDENT_AMBULATORY_CARE_PROVIDER_SITE_OTHER): Payer: Medicare HMO | Admitting: Internal Medicine

## 2022-05-07 VITALS — BP 110/77 | HR 69 | Ht 61.5 in | Wt 166.6 lb

## 2022-05-07 DIAGNOSIS — J392 Other diseases of pharynx: Secondary | ICD-10-CM | POA: Diagnosis not present

## 2022-05-07 NOTE — Progress Notes (Signed)
Acute Office Visit  Subjective:     Patient ID: Jessica Russell, female    DOB: Jul 28, 1927, 88 y.o.   MRN: [id number]  Chief Complaint  Patient presents with   Sore Throat    Pt reports sore throat in the upper back part of her throat, hurts to swallow. Has been doing salt garles with warm water, onset of sx began since 04/02/2022. Reports yellow mucus.    Jessica Russell presents today for evaluation of persistent throat irritation.  She reports developing a sore throat along with upper respiratory symptoms in late November.  She was evaluated by Dr. Court Joy and treated with Augmentin x 7 days at that time.  Her upper respiratory symptoms have mostly improved.  She still experiences nasal congestion.  However her throat irritation has persisted.  Overall she feels as though her symptoms are improving. Jessica Russell gargle with salt water for the first time last night and reports that today is the best that she has felt.  She has also tried using Ricola cough drops with honey.  She denies fever/chills.  She is able to eat and swallow without difficulty.  Her pain is mostly located along the hard palate and the roof of her mouth not as much in the back of her throat.  Review of Systems  HENT:  Positive for sore throat.       Objective:    BP 110/77   Pulse 69   Ht 5' 1.5" (1.562 m)   Wt 166 lb 9.6 oz (75.6 kg)   SpO2 97%   BMI 30.97 kg/m   Physical Exam Vitals reviewed.  Constitutional:      Appearance: She is well-developed. She is not ill-appearing.  HENT:     Head: Normocephalic and atraumatic.     Right Ear: Tympanic membrane and ear canal normal.     Left Ear: Tympanic membrane and ear canal normal.     Mouth/Throat:     Mouth: Mucous membranes are moist. No oral lesions.     Pharynx: Uvula midline. Posterior oropharyngeal erythema (Mild erythema on hard palate) present. No pharyngeal swelling, oropharyngeal exudate or uvula swelling.  Cardiovascular:     Rate and Rhythm:  Normal rate and regular rhythm.     Heart sounds: Normal heart sounds.  Pulmonary:     Effort: Pulmonary effort is normal.     Breath sounds: Normal breath sounds.  Neurological:     Mental Status: She is alert.       Assessment & Plan:   Problem List Items Addressed This Visit       Throat irritation - Primary    Persistent symptoms that overall seems to be improving.  She reports onset of symptoms in late November.  She was initially treated with Augmentin x 7 days.  Her upper respiratory symptoms have mostly resolved, however the sore throat has persisted.  Her pain is not truly located in the posterior pharynx, rather mostly along the hard palate.  No exudate or significant erythema is appreciated on exam today.  She is afebrile.  Her voice is not muffled.  She has not experienced significant drooling.  She is able to eat without significant discomfort.  She states that her symptoms today are the best that they have been after gargling with salt water 1 time. -I have recommended salt water rinses 3 times daily for the next week -I have also recommended that she purchase Chloraseptic spray or lozenges at her pharmacy -She was  instructed to return to care if she develops fever, significant odynophagia, muffled voice, or begins to drool.      Return if symptoms worsen or fail to improve.  Johnette Abraham, MD

## 2022-05-07 NOTE — Patient Instructions (Signed)
It was a pleasure to see you today.  Thank you for giving [place] the opportunity to be involved in your care.  Below is a brief recap of your visit and next steps.  We will plan to see you again in 6 weeks.  Summary I recommend purchasing chloraseptic spray or lozenges at your pharmacy Start doing salt water rinses 3 times weekly Return to care before February if you develop fever, changes to your voice, difficulty swallowing, or drooling.

## 2022-05-07 NOTE — Assessment & Plan Note (Signed)
Persistent symptoms that overall seems to be improving.  She reports onset of symptoms in late November.  She was initially treated with Augmentin x 7 days.  Her upper respiratory symptoms have mostly resolved, however the sore throat has persisted.  Her pain is not truly located in the posterior pharynx, rather mostly along the hard palate.  No exudate or significant erythema is appreciated on exam today.  She is afebrile.  Her voice is not muffled.  She has not experienced significant drooling.  She is able to eat without significant discomfort.  She states that her symptoms today are the best that they have been after gargling with salt water 1 time. -I have recommended salt water rinses 3 times daily for the next week -I have also recommended that she purchase Chloraseptic spray or lozenges at her pharmacy -She was instructed to return to care if she develops fever, significant odynophagia, muffled voice, or begins to drool.

## 2022-05-29 ENCOUNTER — Other Ambulatory Visit: Payer: Self-pay | Admitting: Internal Medicine

## 2022-05-29 DIAGNOSIS — J069 Acute upper respiratory infection, unspecified: Secondary | ICD-10-CM

## 2022-06-10 ENCOUNTER — Ambulatory Visit (INDEPENDENT_AMBULATORY_CARE_PROVIDER_SITE_OTHER): Payer: Medicare HMO | Admitting: Family Medicine

## 2022-06-10 ENCOUNTER — Ambulatory Visit (HOSPITAL_COMMUNITY)
Admission: RE | Admit: 2022-06-10 | Discharge: 2022-06-10 | Disposition: A | Discharge status: Home / Self Care | Payer: Medicare HMO | Source: Ambulatory Visit | Attending: Family Medicine | Admitting: Family Medicine

## 2022-06-10 ENCOUNTER — Encounter: Payer: Self-pay | Admitting: Family Medicine

## 2022-06-10 VITALS — BP 103/68 | HR 83 | Ht 61.0 in | Wt 165.0 lb

## 2022-06-10 DIAGNOSIS — R053 Chronic cough: Secondary | ICD-10-CM | POA: Diagnosis present

## 2022-06-10 MED ORDER — GUAIFENESIN 100 MG/5ML PO LIQD: 5.0000 mL | ORAL | 0 refills | Status: DC | PRN

## 2022-06-10 MED ORDER — AZELASTINE-FLUTICASONE 137-50 MCG/ACT NA SUSP: 1.0000 | Freq: Two times a day (BID) | NASAL | 1 refills | Status: DC

## 2022-06-10 MED ORDER — BENZONATATE 100 MG PO CAPS: 100.0000 mg | ORAL_CAPSULE | Freq: Two times a day (BID) | ORAL | 0 refills | Status: DC | PRN

## 2022-06-10 MED ORDER — LEVOCETIRIZINE DIHYDROCHLORIDE 5 MG PO TABS: 5.0000 mg | ORAL_TABLET | Freq: Every evening | ORAL | 1 refills | Status: DC

## 2022-06-10 NOTE — Progress Notes (Signed)
Patient Office Visit   Subjective   Patient ID: Jessica Russell, female    DOB: 04-05-1928  Age: 89 y.o. MRN: [id number]  CC:  Chief Complaint  Patient presents with   Cough    Patient complains of cough off and on since November 26.    HPI Jessica Russell presents to clinic complaining of cough that comes and go since November. Patient reports she was feeling better but her cough starting getting worse about a week ago. She  has a past medical history of Diabetic nephropathy ([place]), Foot ulcer, right, with fat layer exposed ([place]), History of bilateral breast cancer (04-14-2018 per pt no recurrence), History of diverticulitis of colon (2009), History of external beam radiation therapy, HLD (hyperlipidemia) (03/24/2017), Neuropathy, OA (osteoarthritis), Osteoporosis (04/14/2017), Peripheral arterial occlusive disease ([place]), Personal history of radiation therapy, Rheumatoid arthritis ([place]) (03/13/2019), Type 2 diabetes mellitus ([place]), Vestibular schwannoma ([place]), and Vitamin D deficiency disease (03/13/2019).  Patient complains of nasal congestion, productive cough, rhinorrhea and sneezing. Symptoms began 1 week ago.The cough is without wheezing, dyspnea,productive of green/yellow sputum, with tiny blood tinged sputum x 1. Her cough is not aggravated by anything Patient does not have new pets. Patient does not have a history of environmental allergens or asthma. Patient does not have a history of smoking. Patient tried OTC alka-selter for cold and flu with no relief.     Outpatient Encounter Medications as of 06/10/2022  Medication Sig   acetaminophen (TYLENOL) 500 MG tablet Take 1,500 mg by mouth at bedtime.   aspirin EC 81 MG tablet Take 1 tablet (81 mg total) by mouth daily.   atorvastatin (LIPITOR) 10 MG tablet Take by mouth.   Azelastine-Fluticasone 137-50 MCG/ACT SUSP Place 1 spray into the nose every 12 (twelve) hours.   benzonatate (TESSALON) 100 MG capsule Take 1 capsule (100 mg  total) by mouth 2 (two) times daily as needed for cough.   CALCIUM-MAGNESIUM PO Take by mouth daily.   carboxymethylcellul-glycerin (OPTIVE) 0.5-0.9 % ophthalmic solution Apply to eye.   fluocinonide gel (LIDEX) 3.71 % Apply 1 application topically as needed (mouth sores).    folic acid (FOLVITE) 1 MG tablet Take 1 mg by mouth daily.   gabapentin (NEURONTIN) 400 MG capsule TAKE 2 CAPSULES (800 MG TOTAL) BY MOUTH 4 (FOUR) TIMES DAILY.   guaiFENesin (ROBITUSSIN) 100 MG/5ML liquid Take 5 mLs by mouth every 4 (four) hours as needed for cough or to loosen phlegm.   ipratropium (ATROVENT) 0.03 % nasal spray PLACE 2 SPRAYS INTO BOTH NOSTRILS EVERY 12 (TWELVE) HOURS AS NEEDED FOR RHINITIS   latanoprost (XALATAN) 0.005 % ophthalmic solution    levocetirizine (XYZAL) 5 MG tablet Take 1 tablet (5 mg total) by mouth every evening.   Menaquinone-7 (VITAMIN K2 PO) Take by mouth daily.   metFORMIN (GLUCOPHAGE) 500 MG tablet TAKE 1 TABLET BY MOUTH EVERY DAY   methotrexate (RHEUMATREX) 2.5 MG tablet Take 15 mg by mouth once a week. Caution:Chemotherapy. Protect from light.   Methotrexate 2.5 MG/ML SOLN 9 tablets Orally once a week for 90 days   Methylcellulose, Laxative, (CITRUCEL PO) Take by mouth at bedtime. 1 tablespoon in 3 oz water   Multiple Vitamins-Minerals (CENTRUM SILVER PO) Take by mouth daily.   MYRBETRIQ 50 MG TB24 tablet Take 50 mg by mouth daily.   Omega-3 Fatty Acids (RA FISH OIL) 1000 MG CAPS Take by mouth daily.   Polyvinyl Alcohol-Povidone (REFRESH OP) Apply to eye 2 (two) times daily as needed.  Turmeric 1053 MG TABS Take 600 mg by mouth. With black pepper extract   vitamin B-12 (CYANOCOBALAMIN) 500 MCG tablet Take 500 mcg by mouth daily.   [DISCONTINUED] guaiFENesin (MUCINEX) 600 MG 12 hr tablet Take 1 tablet (600 mg total) by mouth 2 (two) times daily as needed.   No facility-administered encounter medications on file as of 06/10/2022.    Past Surgical History:  Procedure Laterality  Date   ABDOMINAL AORTOGRAM W/LOWER EXTREMITY N/A 04/13/2018   Procedure: ABDOMINAL AORTOGRAM W/LOWER EXTREMITY;  Surgeon: [name], MD;  Location: [facility];  Service: Cardiovascular;  Laterality: N/A;   APPENDECTOMY  age 72   BREAST LUMPECTOMY Right 2000   BREAST LUMPECTOMY Left 2002   BREAST LUMPECTOMY WITH AXILLARY LYMPH NODE DISSECTION Bilateral right 2000;   left 2002   BUNIONECTOMY Bilateral 1968   CARPAL TUNNEL RELEASE Right 2017   CATARACT EXTRACTION W/PHACO Right 08/30/2017   Procedure: CATARACT EXTRACTION PHACO AND INTRAOCULAR LENS PLACEMENT RIGHT EYE;  Surgeon: [name], MD;  Location: [facility];  Service: Ophthalmology;  Laterality: Right;  CDE: 13.63   CATARACT EXTRACTION W/PHACO Left 09/20/2017   Procedure: CATARACT EXTRACTION PHACO AND INTRAOCULAR LENS PLACEMENT LEFT EYE;  Surgeon: [name], MD;  Location: [facility];  Service: Ophthalmology;  Laterality: Left;  CDE: 11.25   D & C HYSTEROSCOPY W/ RESECTION POLYP  2017   per pt benign   FOOT SURGERY     removal of two bones   FRACTURE SURGERY Left 05/12/2016   patella fracture,   per pt no retatined hardware   GRAFT APPLICATION N/A 53/10/1441   Procedure: APPLICATION OF STRAVIX GRAFT;  Surgeon: [name], DPM;  Location: [facility];  Service: Podiatry;  Laterality: N/A;   HEMICOLECTOMY  2009   diverticulitis w/ perforation   METATARSAL HEAD EXCISION N/A 04/18/2018   Procedure: FIRST METATARSAL HEAD RECESSION, WOUND DEBRIEDMENT;  Surgeon: [name], DPM;  Location: [facility];  Service: Podiatry;  Laterality: N/A;   NASAL SEPTOPLASTY W/ TURBINOPLASTY Bilateral 01/04/2018   Procedure: NASAL SEPTOPLASTY WITH BILATERAL TURBINATE REDUCTION;  Surgeon: [name], MD;  Location: [facility];  Service: ENT;  Laterality: Bilateral;   PERIPHERAL VASCULAR BALLOON ANGIOPLASTY  04/13/2018   Procedure: PERIPHERAL VASCULAR BALLOON ANGIOPLASTY;  Surgeon: [name],  MD;  Location: [facility];  Service: Cardiovascular;;  Anterior Tibial   SKIN GRAFT     left foot    VENTRAL HERNIA REPAIR  01-07-2016    [name], [place]   w/ mesh    Review of Systems  Constitutional:  Negative for chills and fever.  Respiratory:  Positive for cough and sputum production. Negative for shortness of breath and wheezing.   Cardiovascular:  Negative for chest pain, palpitations and orthopnea.  Gastrointestinal:  Negative for abdominal pain, nausea and vomiting.      Objective    BP 103/68   Pulse 83   Ht '5\' 1"'$  (1.549 m)   Wt 165 lb (74.8 kg)   SpO2 95%   BMI 31.18 kg/m   Physical Exam Constitutional:      Appearance: Normal appearance.  Cardiovascular:     Rate and Rhythm: Normal rate.     Pulses: Normal pulses.     Heart sounds: Normal heart sounds.  Pulmonary:     Effort: Pulmonary effort is normal. No respiratory distress.     Breath sounds: Normal breath sounds. No wheezing, rhonchi or rales.  Chest:  Chest wall: No tenderness.  Skin:    Capillary Refill: Capillary refill takes less than 2 seconds.  Neurological:     General: No focal deficit present.     Mental Status: She is alert.     Coordination: Coordination abnormal.     Gait: Gait abnormal.  Psychiatric:        Mood and Affect: Mood normal.       Assessment & Plan:  Chronic cough Assessment & Plan: Patient completed Augmentin antibiotic therapy early December Cough started a week ago, possible bronchitis Xray ordered  to rule out pneumonia  Treated symptoms: Azelastine-Fluticasone 137-50 MCG Guaifenesin '100mg'$ /86m [place] 100 mg Xyxal 5 mg Will follow up on xray results  Orders: -     DG Chest 2 View; Future -     Levocetirizine Dihydrochloride; Take 1 tablet (5 mg total) by mouth every evening.  Dispense: 15 tablet; Refill: 1 -     Benzonatate; Take 1 capsule (100 mg total) by mouth 2 (two) times daily as needed for cough.  Dispense: 20 capsule; Refill: 0 -      guaiFENesin; Take 5 mLs by mouth every 4 (four) hours as needed for cough or to loosen phlegm.  Dispense: 120 mL; Refill: 0 -     Azelastine-Fluticasone; Place 1 spray into the nose every 12 (twelve) hours.  Dispense: 23 g; Refill: 1    No follow-ups on file.   IRenard HamperORia Comment FNP

## 2022-06-10 NOTE — Patient Instructions (Signed)
It was a pleasure meeting with you today. Please take medications as prescribed, if symptoms worsen please contact your health care provider

## 2022-06-10 NOTE — Assessment & Plan Note (Addendum)
Patient completed Augmentin antibiotic therapy early December Cough started a week ago, possible bronchitis Xray ordered  to rule out pneumonia  Treated symptoms: Azelastine-Fluticasone 137-50 MCG Guaifenesin '100mg'$ /9m [place] 100 mg Xyxal 5 mg Will follow up on xray results

## 2022-06-12 ENCOUNTER — Telehealth: Payer: Self-pay | Admitting: Internal Medicine

## 2022-06-12 NOTE — Telephone Encounter (Signed)
Patient calling about her xray results. She assume is fine considering noone has call the patient. Please return patient call [phone]

## 2022-06-12 NOTE — Telephone Encounter (Signed)
Patient advised about xray results.

## 2022-06-14 ENCOUNTER — Other Ambulatory Visit: Payer: Self-pay | Admitting: Internal Medicine

## 2022-06-14 DIAGNOSIS — J069 Acute upper respiratory infection, unspecified: Secondary | ICD-10-CM

## 2022-06-18 ENCOUNTER — Other Ambulatory Visit: Payer: Self-pay | Admitting: Family Medicine

## 2022-06-18 ENCOUNTER — Encounter: Payer: Self-pay | Admitting: Internal Medicine

## 2022-06-18 ENCOUNTER — Ambulatory Visit (INDEPENDENT_AMBULATORY_CARE_PROVIDER_SITE_OTHER): Payer: Medicare HMO | Admitting: Internal Medicine

## 2022-06-18 VITALS — BP 122/68 | HR 69 | Ht 61.0 in | Wt 170.6 lb

## 2022-06-18 DIAGNOSIS — R053 Chronic cough: Secondary | ICD-10-CM

## 2022-06-18 DIAGNOSIS — M069 Rheumatoid arthritis, unspecified: Secondary | ICD-10-CM | POA: Diagnosis not present

## 2022-06-18 DIAGNOSIS — M8580 Other specified disorders of bone density and structure, unspecified site: Secondary | ICD-10-CM | POA: Diagnosis not present

## 2022-06-18 DIAGNOSIS — E1142 Type 2 diabetes mellitus with diabetic polyneuropathy: Secondary | ICD-10-CM

## 2022-06-18 DIAGNOSIS — E1149 Type 2 diabetes mellitus with other diabetic neurological complication: Secondary | ICD-10-CM

## 2022-06-18 DIAGNOSIS — N3941 Urge incontinence: Secondary | ICD-10-CM

## 2022-06-18 MED ORDER — MYRBETRIQ 50 MG PO TB24: 50.0000 mg | ORAL_TABLET | Freq: Every day | ORAL | 2 refills | Status: AC

## 2022-06-18 NOTE — Patient Instructions (Signed)
It was a pleasure to see you today.  Thank you for giving [place] the opportunity to be involved in your care.  Below is a brief recap of your visit and next steps.  We will plan to see you again in 3 months.  Summary No medication changes today Plan for follow up and repeat labs in 3 months. We will complete your annual exam at that time.

## 2022-06-18 NOTE — Progress Notes (Signed)
Established Patient Office Visit  Subjective   Patient ID: [name], female    DOB: June 03, 1927  Age: 89 y.o. MRN: [id number]  Chief Complaint  Patient presents with   Diabetes    Follow up   Ms. Belgarde returns to care today for follow-up.  She was last seen at [facility] on 1/31 for an acute visit for evaluation of chronic cough.  She was prescribed multiple medications for supportive care and a chest x-ray was ordered.  There have been no acute interval events. Today Puff endorses resolving upper respiratory symptoms.  She continues to endorse chronic neuropathic pain as well.  She has no acute concerns to discuss.  Past Medical History:  Diagnosis Date   Diabetic nephropathy ([place])    Foot ulcer, right, with fat layer exposed ([place])    non-healing   History of bilateral breast cancer 04-14-2018 per pt no recurrence   dx 2000 w/ right breast cancer, s/p  lumpectomy with node dissection completed radiation/  dx 2002 w/ left breast cancer, s/p lumpectomy with node dissection,  completed radiation   History of diverticulitis of colon 2009   s/p  partial colectomy for perforated diverticulitis   History of external beam radiation therapy    2000 for right breast cancer;   2002  for left breast cancer  (per pt 36 treatment for each breast)   HLD (hyperlipidemia) 03/24/2017   Neuropathy    OA (osteoarthritis)    Osteoporosis 04/14/2017   Peripheral arterial occlusive disease ([place])    04-13-2018   s/p  balloon angioplasty to the anterior tibial artery for severe stenosis right lower extremity (dr Fletcher Anon)   Personal history of radiation therapy    both breasts   Rheumatoid arthritis ([place]) 03/13/2019   Type 2 diabetes mellitus ([place])    Vestibular schwannoma ([place])     S/p Gamma Knife stereotactic radiosurgery April 2022   Vitamin D deficiency disease 03/13/2019   Past Surgical History:  Procedure Laterality Date   ABDOMINAL AORTOGRAM W/LOWER EXTREMITY N/A 04/13/2018   Procedure:  ABDOMINAL AORTOGRAM W/LOWER EXTREMITY;  Surgeon: [name], MD;  Location: [facility];  Service: Cardiovascular;  Laterality: N/A;   APPENDECTOMY  age 26   BREAST LUMPECTOMY Right 2000   BREAST LUMPECTOMY Left 2002   BREAST LUMPECTOMY WITH AXILLARY LYMPH NODE DISSECTION Bilateral right 2000;   left 2002   BUNIONECTOMY Bilateral 1968   CARPAL TUNNEL RELEASE Right 2017   CATARACT EXTRACTION W/PHACO Right 08/30/2017   Procedure: CATARACT EXTRACTION PHACO AND INTRAOCULAR LENS PLACEMENT RIGHT EYE;  Surgeon: [name], MD;  [facility];  Service: Ophthalmology;  Laterality: Right;  CDE: 13.63   CATARACT EXTRACTION W/PHACO Left 09/20/2017   Procedure: CATARACT EXTRACTION PHACO AND INTRAOCULAR LENS PLACEMENT LEFT EYE;  Surgeon: [name], MD;  [facility];  Service: Ophthalmology;  Laterality: Left;  CDE: 11.25   D & C HYSTEROSCOPY W/ RESECTION POLYP  2017   per pt benign   FOOT SURGERY     removal of two bones   FRACTURE SURGERY Left 05/12/2016   patella fracture,   per pt no retatined hardware   GRAFT APPLICATION N/A [id number]   Procedure: APPLICATION OF STRAVIX GRAFT;  Surgeon: [name], DPM;  Location: [facility];  Service: Podiatry;  Laterality: N/A;   HEMICOLECTOMY  2009   diverticulitis w/ perforation   METATARSAL HEAD EXCISION N/A 04/18/2018   Procedure: FIRST METATARSAL HEAD RECESSION, WOUND DEBRIEDMENT;  Surgeon: Fritzi Mandes,  Jana Half, DPM;  Location: [facility];  Service: Podiatry;  Laterality: N/A;   NASAL SEPTOPLASTY W/ TURBINOPLASTY Bilateral 01/04/2018   Procedure: NASAL SEPTOPLASTY WITH BILATERAL TURBINATE REDUCTION;  Surgeon: [name], MD;  Location: [facility];  Service: ENT;  Laterality: Bilateral;   PERIPHERAL VASCULAR BALLOON ANGIOPLASTY  04/13/2018   Procedure: PERIPHERAL VASCULAR BALLOON ANGIOPLASTY;  Surgeon: [name], MD;  Location: [facility];  Service: Cardiovascular;;  Anterior  Tibial   SKIN GRAFT     left foot    VENTRAL HERNIA REPAIR  01-07-2016    [name], [place]   w/ mesh   Social History   Tobacco Use   Smoking status: Former    Packs/day: 1.00    Years: 12.00    Total pack years: 12.00    Types: Cigarettes    Start date: 05/11/1950    Quit date: 05/12/1963    Years since quitting: 59.1   Smokeless tobacco: Never  Vaping Use   Vaping Use: Never used  Substance Use Topics   Alcohol use: No   Drug use: No   Family History  Problem Relation Age of Onset   Diabetes Mother    Heart disease Mother    Arthritis Mother    Hypertension Mother    Miscarriages / [place] Mother    Early death Father    Stroke Father    Alcohol abuse Father    Diabetes Sister    Stroke Sister    Diabetes Brother    Diverticulitis Son    Diabetes Sister    Stroke Sister    Colon cancer Neg Hx    Allergies  Allergen Reactions   Phenergan [Promethazine Hcl] Other (See Comments)    DELIRIUM (HALLUCINATIONS)   Ciprofloxacin Swelling   Levofloxacin Swelling   Review of Systems  HENT:  Positive for congestion.   Respiratory:  Positive for cough. Negative for sputum production.   All other systems reviewed and are negative.    Objective:     BP 122/68   Pulse 69   Ht 5' 1"$  (1.549 m)   Wt 170 lb 9.6 oz (77.4 kg)   SpO2 92%   BMI 32.23 kg/m  BP Readings from Last 3 Encounters:  06/18/22 122/68  06/10/22 103/68  05/07/22 110/77   Physical Exam Vitals reviewed.  Constitutional:      General: She is not in acute distress.    Appearance: Normal appearance. She is not toxic-appearing.  HENT:     Head: Normocephalic and atraumatic.     Right Ear: External ear normal.     Left Ear: External ear normal.     Nose: Nose normal. No congestion or rhinorrhea.     Mouth/Throat:     Mouth: Mucous membranes are moist.     Pharynx: Oropharynx is clear. No oropharyngeal exudate or posterior oropharyngeal erythema.  Eyes:     General: No scleral icterus.     Extraocular Movements: Extraocular movements intact.     Conjunctiva/sclera: Conjunctivae normal.     Pupils: Pupils are equal, round, and reactive to light.  Cardiovascular:     Rate and Rhythm: Normal rate and regular rhythm.     Pulses: Normal pulses.     Heart sounds: Normal heart sounds. No murmur heard.    No friction rub. No gallop.  Pulmonary:     Effort: Pulmonary effort is normal.     Breath sounds: Normal breath sounds. No wheezing, rhonchi or rales.  Abdominal:     General: Abdomen is flat. Bowel sounds are normal. There is no distension.     Palpations: Abdomen is soft.     Tenderness: There is no abdominal tenderness.  Musculoskeletal:        General: No swelling. Normal range of motion.     Cervical back: Normal range of motion.     Right lower leg: No edema.     Left lower leg: No edema.  Lymphadenopathy:     Cervical: No cervical adenopathy.  Skin:    General: Skin is warm and dry.     Capillary Refill: Capillary refill takes less than 2 seconds.     Coloration: Skin is not jaundiced.  Neurological:     General: No focal deficit present.     Mental Status: She is alert and oriented to person, place, and time.     Gait: Gait abnormal.  Psychiatric:        Mood and Affect: Mood normal.        Behavior: Behavior normal.   Last CBC Lab Results  Component Value Date   WBC 4.3 03/16/2022   HGB 13.7 03/16/2022   HCT 42.1 03/16/2022   MCV 89 03/16/2022   MCH 29.0 03/16/2022   RDW 14.9 03/16/2022   PLT 193 0000000   Last metabolic panel Lab Results  Component Value Date   GLUCOSE 100 (H) 03/16/2022   NA 139 03/16/2022   K 4.8 03/16/2022   CL 101 03/16/2022   CO2 25 03/16/2022   BUN 21 03/16/2022   CREATININE 0.81 03/16/2022   EGFR 68 03/16/2022   CALCIUM 10.3 03/16/2022   PROT 6.8 03/16/2022   ALBUMIN 4.3 03/16/2022   LABGLOB 2.5 03/16/2022   AGRATIO 1.7 03/16/2022   BILITOT 0.3 03/16/2022   ALKPHOS 93 03/16/2022   AST 28 03/16/2022   ALT 24  03/16/2022   ANIONGAP 8 07/04/2021   Last lipids Lab Results  Component Value Date   CHOL 197 03/16/2022   HDL 47 03/16/2022   LDLCALC 94 03/16/2022   TRIG 334 (H) 03/16/2022   CHOLHDL 4.2 03/16/2022   Last hemoglobin A1c Lab Results  Component Value Date   HGBA1C 6.5 (H) 03/16/2022   Last thyroid functions Lab Results  Component Value Date   TSH 1.180 03/16/2022   Last vitamin D Lab Results  Component Value Date   VD25OH 37.2 03/16/2022   Last vitamin B12 and Folate Lab Results  Component Value Date   VITAMINB12 970 03/16/2022     Assessment & Plan:   Problem List Items Addressed This Visit       Diabetic neuropathy ([place])    Chronic issue.  She is currently prescribed gabapentin 800 mg 4 times daily.  She continues to endorse neuropathic pain today, but states that it has not worsened.  She is not interested in making any medication adjustments today.      Type 2 diabetes mellitus with neurological complications ([place]) - Primary    A1c 6.5 in November.  She is currently prescribed metformin 500 mg daily. -No medication changes today -She is scheduled to see podiatry next week      Osteopenia    Previously documented history of osteopenia.  She is not currently on a vitamin D supplement. -I have recommended that she resume daily vitamin D supplementation.      Rheumatoid arthritis ([place])    Followed by rheumatology.  She remains on methotrexate and folic acid supplement.  Denies recent  flares.  Symptoms seem well-controlled today.      Urge incontinence of urine    Symptoms well-controlled with mirabegron.  This has been refilled today.      Return in about 3 months (around 09/16/2022) for CPE.   Johnette Abraham, MD

## 2022-06-24 NOTE — Assessment & Plan Note (Signed)
Symptoms well-controlled with mirabegron.  This has been refilled today.

## 2022-06-24 NOTE — Assessment & Plan Note (Signed)
Previously documented history of osteopenia.  She is not currently on a vitamin D supplement. -I have recommended that she resume daily vitamin D supplementation.

## 2022-06-24 NOTE — Assessment & Plan Note (Addendum)
Followed by rheumatology.  She remains on methotrexate and folic acid supplement.  Denies recent flares.  Symptoms seem well-controlled today.

## 2022-06-24 NOTE — Assessment & Plan Note (Signed)
Chronic issue.  She is currently prescribed gabapentin 800 mg 4 times daily.  She continues to endorse neuropathic pain today, but states that it has not worsened.  She is not interested in making any medication adjustments today.

## 2022-06-24 NOTE — Assessment & Plan Note (Signed)
A1c 6.5 in November.  She is currently prescribed metformin 500 mg daily. -No medication changes today -She is scheduled to see podiatry next week

## 2022-06-26 ENCOUNTER — Telehealth: Payer: Self-pay | Admitting: Internal Medicine

## 2022-06-26 ENCOUNTER — Telehealth: Payer: Self-pay | Admitting: Cardiovascular Disease

## 2022-06-26 NOTE — Telephone Encounter (Signed)
Spoke w patient.  She was thinking her appointment on 2/27 was for ultrasound as well as seeing Dr. Fletcher Anon.  I do not see orders for testing or an appointment.  I adv that she should plan on seeing Dr. Fletcher Anon and if at the visit he feels testing is needed he will arrange at that time.  Pt reports the "bulge" on her lower left leg is tender to touch and has been there for some time.  Denies discoloration to the area.  Just raised and tender.

## 2022-06-26 NOTE — Telephone Encounter (Signed)
Diabetic foot exam form needs additonal information, when completed fax # [phone] and call patient when ready to pick up  Copied Noted sleeved

## 2022-06-26 NOTE — Telephone Encounter (Signed)
Pt states that she would like the upcoming test to be done on both legs due to a bulge she is having in the left leg and would like to discuss this. Please advise.

## 2022-06-30 DIAGNOSIS — Z0279 Encounter for issue of other medical certificate: Secondary | ICD-10-CM

## 2022-06-30 NOTE — Telephone Encounter (Signed)
Called patient left voicemail to pick up forms. Forms faxed to [phone]

## 2022-07-07 ENCOUNTER — Ambulatory Visit: Payer: Medicare HMO | Attending: Cardiovascular Disease | Admitting: Cardiovascular Disease

## 2022-07-07 VITALS — BP 138/74 | HR 69 | Ht 61.0 in | Wt 169.8 lb

## 2022-07-07 DIAGNOSIS — R2240 Localized swelling, mass and lump, unspecified lower limb: Secondary | ICD-10-CM | POA: Diagnosis not present

## 2022-07-07 DIAGNOSIS — I739 Peripheral vascular disease, unspecified: Secondary | ICD-10-CM

## 2022-07-07 DIAGNOSIS — E785 Hyperlipidemia, unspecified: Secondary | ICD-10-CM

## 2022-07-07 NOTE — Patient Instructions (Signed)
Medication Instructions:  No changes *If you need a refill on your cardiac medications before your next appointment, please call your pharmacy*   Lab Work: None ordered If you have labs (blood work) drawn today and your tests are completely normal, you will receive your results only by: [place] (if you have MyChart) OR A paper copy in the mail If you have any lab test that is abnormal or we need to change your treatment, we will call you to review the results.   Testing/Procedures: Your physician has requested that you have a lower extremity venous duplex. This test is an ultrasound of the veins in the legs or arms. It looks at venous blood flow that carries blood from the heart to the legs or arms. Allow one hour for a Lower Venous exam. Allow thirty minutes for an Upper Venous exam. There are no restrictions or special instructions.     Follow-Up: At [facility], you and your health needs are our priority.  As part of our continuing mission to provide you with exceptional heart care, we have created designated Provider Care Teams.  These Care Teams include your primary Cardiologist (physician) and Advanced Practice Providers (APPs -  Physician Assistants and Nurse Practitioners) who all work together to provide you with the care you need, when you need it.  We recommend signing up for the patient portal called "MyChart".  Sign up information is provided on this After Visit Summary.  MyChart is used to connect with patients for Virtual Visits (Telemedicine).  Patients are able to view lab/test results, encounter notes, upcoming appointments, etc.  Non-urgent messages can be sent to your provider as well.   To learn more about what you can do with MyChart, go to [email or web address].    Your next appointment:   6 month(s)  Provider:   Kathlyn Sacramento, MD

## 2022-07-07 NOTE — Progress Notes (Unsigned)
Cardiology Office Note   Date:  07/08/2022   ID:  Jessica Russell, DOB 1928-04-24, MRN [id number]  PCP:  Johnette Abraham, MD  Cardiologist:   Kathlyn Sacramento, MD   No chief complaint on file.     History of Present Illness: Jessica Russell is a 89 y.o. female who is here today for follow-up visit regarding peripheral arterial disease. She has known history of diabetes mellitus with peripheral neuropathy, rheumatoid arthritis and hyperlipidemia.  She was referred in 2019 by Dr. Fritzi Mandes for evaluation of peripheral arterial disease and nonhealing ulceration on the right foot. Angiography in December 2019 showed no significant aortoiliac disease.  On the right side, there was severe stenosis in the mid anterior tibial artery with occluded dorsalis pedis distally, occluded TP trunk with reconstitution of the peroneal artery proximally via extensive collaterals from the anterior tibial and reconstitution of the distal posterior tibial artery via collaterals from the peroneal artery.  I performed successful balloon angioplasty to the anterior tibial artery. She ultimately underwent resection of first transmetatarsal bone with complete healing.    She has no open ulceration but she is concerned about increased swelling and discomfort affecting the left leg from the knee down.  No prior history of DVT.  Past Medical History:  Diagnosis Date   Diabetic nephropathy ([place])    Foot ulcer, right, with fat layer exposed ([place])    non-healing   History of bilateral breast cancer 04-14-2018 per pt no recurrence   dx 2000 w/ right breast cancer, s/p  lumpectomy with node dissection completed radiation/  dx 2002 w/ left breast cancer, s/p lumpectomy with node dissection,  completed radiation   History of diverticulitis of colon 2009   s/p  partial colectomy for perforated diverticulitis   History of external beam radiation therapy    2000 for right breast cancer;   2002  for left breast cancer   (per pt 36 treatment for each breast)   HLD (hyperlipidemia) 03/24/2017   Neuropathy    OA (osteoarthritis)    Osteoporosis 04/14/2017   Peripheral arterial occlusive disease ([place])    04-13-2018   s/p  balloon angioplasty to the anterior tibial artery for severe stenosis right lower extremity (dr Fletcher Anon)   Personal history of radiation therapy    both breasts   Rheumatoid arthritis ([place]) 03/13/2019   Type 2 diabetes mellitus ([place])    Vestibular schwannoma ([place])     S/p Gamma Knife stereotactic radiosurgery April 2022   Vitamin D deficiency disease 03/13/2019    Past Surgical History:  Procedure Laterality Date   ABDOMINAL AORTOGRAM W/LOWER EXTREMITY N/A 04/13/2018   Procedure: ABDOMINAL AORTOGRAM W/LOWER EXTREMITY;  Surgeon: [name], MD;  Location: [facility];  Service: Cardiovascular;  Laterality: N/A;   APPENDECTOMY  age 42   BREAST LUMPECTOMY Right 2000   BREAST LUMPECTOMY Left 2002   BREAST LUMPECTOMY WITH AXILLARY LYMPH NODE DISSECTION Bilateral right 2000;   left 2002   BUNIONECTOMY Bilateral 1968   CARPAL TUNNEL RELEASE Right 2017   CATARACT EXTRACTION W/PHACO Right 08/30/2017   Procedure: CATARACT EXTRACTION PHACO AND INTRAOCULAR LENS PLACEMENT RIGHT EYE;  Surgeon: [name], MD;  Location: [facility];  Service: Ophthalmology;  Laterality: Right;  CDE: 13.63   CATARACT EXTRACTION W/PHACO Left 09/20/2017   Procedure: CATARACT EXTRACTION PHACO AND INTRAOCULAR LENS PLACEMENT LEFT EYE;  Surgeon: [name], MD;  Location: [facility];  Service: Ophthalmology;  Laterality: Left;  CDE: 11.25  D & C HYSTEROSCOPY W/ RESECTION POLYP  2017   per pt benign   FOOT SURGERY     removal of two bones   FRACTURE SURGERY Left 05/12/2016   patella fracture,   per pt no retatined hardware   GRAFT APPLICATION N/A [id number]   Procedure: APPLICATION OF STRAVIX GRAFT;  Surgeon: [name], DPM;  Location: [facility];  Service: Podiatry;  Laterality: N/A;    HEMICOLECTOMY  2009   diverticulitis w/ perforation   METATARSAL HEAD EXCISION N/A 04/18/2018   Procedure: FIRST METATARSAL HEAD RECESSION, WOUND DEBRIEDMENT;  Surgeon: [name], DPM;  Location: [facility];  Service: Podiatry;  Laterality: N/A;   NASAL SEPTOPLASTY W/ TURBINOPLASTY Bilateral 01/04/2018   Procedure: NASAL SEPTOPLASTY WITH BILATERAL TURBINATE REDUCTION;  Surgeon: [name], MD;  Location: [facility] [place] [facility];  Service: ENT;  Laterality: Bilateral;   PERIPHERAL VASCULAR BALLOON ANGIOPLASTY  04/13/2018   Procedure: PERIPHERAL VASCULAR BALLOON ANGIOPLASTY;  Surgeon: [name], MD;  Location: [facility];  Service: Cardiovascular;;  Anterior Tibial   SKIN GRAFT     left foot    VENTRAL HERNIA REPAIR  01-07-2016    [name], [place]   w/ mesh     Current Outpatient Medications  Medication Sig Dispense Refill   acetaminophen (TYLENOL) 500 MG tablet Take 1,500 mg by mouth at bedtime.     aspirin EC 81 MG tablet Take 1 tablet (81 mg total) by mouth daily. 90 tablet 3   Azelastine-Fluticasone 137-50 MCG/ACT SUSP Place 1 spray into the nose every 12 (twelve) hours. 23 g 1   CALCIUM-MAGNESIUM PO Take by mouth daily.     carboxymethylcellul-glycerin (OPTIVE) 0.5-0.9 % ophthalmic solution Apply to eye.     cholecalciferol (VITAMIN D3) 25 MCG (1000 UNIT) tablet Take 1,000 Units by mouth daily.     fluocinonide gel (LIDEX) [id number] % Apply 1 application topically as needed (mouth sores).   0   folic acid (FOLVITE) 1 MG tablet Take 1 mg by mouth daily.     gabapentin (NEURONTIN) 400 MG capsule TAKE 2 CAPSULES (800 MG TOTAL) BY MOUTH 4 (FOUR) TIMES DAILY. 720 capsule 1   levocetirizine (XYZAL) 5 MG tablet TAKE 1 TABLET BY MOUTH EVERY DAY IN THE EVENING 90 tablet 1   metFORMIN (GLUCOPHAGE) 500 MG tablet TAKE 1 TABLET BY MOUTH EVERY DAY 90 tablet 1   methotrexate (RHEUMATREX) 2.5 MG tablet Take 15 mg by mouth once a week. Caution:Chemotherapy. Protect from light.      Methotrexate 2.5 MG/ML SOLN 9 tablets Orally once a week for 90 days     Methylcellulose, Laxative, (CITRUCEL PO) Take by mouth at bedtime. 1 tablespoon in 3 oz water     Multiple Vitamins-Minerals (CENTRUM SILVER PO) Take by mouth daily.     MYRBETRIQ 50 MG TB24 tablet Take 1 tablet (50 mg total) by mouth daily. 30 tablet 2   Omega-3 Fatty Acids (RA FISH OIL) 1000 MG CAPS Take by mouth daily.     Polyvinyl Alcohol-Povidone (REFRESH OP) Apply to eye 2 (two) times daily as needed.     Turmeric 1053 MG TABS Take 600 mg by mouth. With black pepper extract     atorvastatin (LIPITOR) 10 MG tablet Take by mouth. (Patient not taking: Reported on 07/07/2022)     benzonatate (TESSALON) 100 MG capsule Take 1 capsule (100 mg total) by mouth 2 (two) times daily as needed for cough. (Patient not taking: Reported on 07/07/2022)  20 capsule 0   guaiFENesin (ROBITUSSIN) 100 MG/5ML liquid Take 5 mLs by mouth every 4 (four) hours as needed for cough or to loosen phlegm. (Patient not taking: Reported on 07/07/2022) 120 mL 0   ipratropium (ATROVENT) 0.03 % nasal spray PLACE 2 SPRAYS INTO BOTH NOSTRILS EVERY 12 (TWELVE) HOURS AS NEEDED FOR RHINITIS (Patient not taking: Reported on 07/07/2022) 30 mL 1   latanoprost (XALATAN) 0.005 % ophthalmic solution  (Patient not taking: Reported on 07/07/2022)     Menaquinone-7 (VITAMIN K2 PO) Take by mouth daily. (Patient not taking: Reported on 07/07/2022)     vitamin B-12 (CYANOCOBALAMIN) 500 MCG tablet Take 500 mcg by mouth daily. (Patient not taking: Reported on 07/07/2022)     No current facility-administered medications for this visit.    Allergies:   Phenergan [promethazine hcl], Ciprofloxacin, and Levofloxacin    Social History:  The patient  reports that she quit smoking about 59 years ago. Her smoking use included cigarettes. She started smoking about 72 years ago. She has a 12.00 pack-year smoking history. She has never used smokeless tobacco. She reports that she does  not drink alcohol and does not use drugs.   Family History:  The patient's family history includes Alcohol abuse in her father; Arthritis in her mother; Diabetes in her brother, mother, sister, and sister; Diverticulitis in her son; Early death in her father; Heart disease in her mother; Hypertension in her mother; Miscarriages / Stillbirths in her mother; Stroke in her father, sister, and sister.    ROS:  Please see the history of present illness.   Otherwise, review of systems are positive for none.   All other systems are reviewed and negative.    PHYSICAL EXAM: VS:  BP 138/74   Pulse 69   Ht '5\' 1"'$  (1.549 m)   Wt 169 lb 12.8 oz (77 kg)   BMI 32.08 kg/m  , BMI Body mass index is 32.08 kg/m. GEN: Well nourished, well developed, in no acute distress  HEENT: normal  Neck: no JVD, carotid bruits, or masses Cardiac: RRR; no  rubs, or gallops. 2 out of 6 systolic murmur in the aortic area which is early peaking.  There is mild swelling involving the left calf with some tenderness. Respiratory:  clear to auscultation bilaterally, normal work of breathing GI: soft, nontender, nondistended, + BS MS: no deformity or atrophy  Skin: warm and dry, no rash Neuro:  Strength and sensation are intact Psych: euthymic mood, full affect Vascular: Distal pulses are very faint.   EKG:  EKG is  ordered today. EKG showed normal sinus rhythm with no significant ST or T wave changes.   Recent Labs: 03/16/2022: ALT 24; BUN 21; Creatinine, Ser 0.81; Hemoglobin 13.7; Platelets 193; Potassium 4.8; Sodium 139; TSH 1.180    Lipid Panel    Component Value Date/Time   CHOL 197 03/16/2022 1110   TRIG 334 (H) 03/16/2022 1110   HDL 47 03/16/2022 1110   CHOLHDL 4.2 03/16/2022 1110   CHOLHDL 4.9 02/07/2020 1410   LDLCALC 94 03/16/2022 1110   LDLCALC  02/07/2020 1410     Comment:     . LDL cholesterol not calculated. Triglyceride levels greater than 400 mg/dL invalidate calculated LDL  results. . Reference range: <100 . Desirable range <100 mg/dL for primary prevention;   <70 mg/dL for patients with CHD or diabetic patients  with > or = 2 CHD risk factors. [name] LDL-C is now calculated using the Martin-Hopkins  calculation, which is  a validated novel method providing  better accuracy than the Friedewald equation in the  estimation of LDL-C.  Cresenciano Genre et al. Annamaria Helling. [id number]): 2061-2068  (http://education.[employer].com/faq/FAQ164)       Wt Readings from Last 3 Encounters:  07/07/22 169 lb 12.8 oz (77 kg)  06/18/22 170 lb 9.6 oz (77.4 kg)  06/10/22 165 lb (74.8 kg)           No data to display            ASSESSMENT AND PLAN:  1.  Peripheral arterial disease with previous nonhealing ulceration on the bottom of the right big toe: Status post balloon angioplasty of the right anterior tibial artery.  No recurrent ulceration or claudication.    2.  Hyperlipidemia: Most recent lipid profile showed significant elevation in triglyceride which was 548.  This is being managed by primary care physician.  3.  Left lower extremity swelling and tenderness: We have to exclude deep venous thrombosis.  I requested venous Doppler.   Disposition:   FU with me in 6 months  Signed,  Kathlyn Sacramento, MD  07/08/2022 1:33 PM    [facility]

## 2022-07-09 ENCOUNTER — Encounter: Payer: Self-pay | Admitting: Radiology

## 2022-07-17 ENCOUNTER — Other Ambulatory Visit: Payer: Self-pay | Admitting: Internal Medicine

## 2022-07-17 DIAGNOSIS — J069 Acute upper respiratory infection, unspecified: Secondary | ICD-10-CM

## 2022-07-22 ENCOUNTER — Ambulatory Visit (HOSPITAL_COMMUNITY)
Admission: RE | Admit: 2022-07-22 | Discharge: 2022-07-22 | Disposition: A | Discharge status: Home / Self Care | Payer: Medicare HMO | Source: Ambulatory Visit | Attending: Cardiovascular Disease | Admitting: Cardiovascular Disease

## 2022-07-22 DIAGNOSIS — R2240 Localized swelling, mass and lump, unspecified lower limb: Secondary | ICD-10-CM | POA: Diagnosis not present

## 2022-07-24 ENCOUNTER — Other Ambulatory Visit: Payer: Self-pay | Admitting: Internal Medicine

## 2022-07-24 DIAGNOSIS — E1142 Type 2 diabetes mellitus with diabetic polyneuropathy: Secondary | ICD-10-CM

## 2022-09-09 ENCOUNTER — Other Ambulatory Visit (HOSPITAL_COMMUNITY): Payer: Self-pay | Admitting: Internal Medicine

## 2022-09-09 DIAGNOSIS — Z1231 Encounter for screening mammogram for malignant neoplasm of breast: Secondary | ICD-10-CM

## 2022-09-21 ENCOUNTER — Encounter: Payer: Medicare HMO | Admitting: Internal Medicine

## 2022-09-28 ENCOUNTER — Ambulatory Visit (HOSPITAL_COMMUNITY): Payer: Medicare HMO

## 2022-10-07 ENCOUNTER — Ambulatory Visit (HOSPITAL_COMMUNITY): Payer: Medicare HMO

## 2022-10-08 ENCOUNTER — Ambulatory Visit (HOSPITAL_COMMUNITY)
Admission: RE | Admit: 2022-10-08 | Discharge: 2022-10-08 | Disposition: A | Discharge status: Home / Self Care | Payer: Medicare HMO | Source: Ambulatory Visit | Attending: Internal Medicine | Admitting: Internal Medicine

## 2022-10-08 DIAGNOSIS — Z1231 Encounter for screening mammogram for malignant neoplasm of breast: Secondary | ICD-10-CM | POA: Insufficient documentation

## 2022-10-18 ENCOUNTER — Other Ambulatory Visit (INDEPENDENT_AMBULATORY_CARE_PROVIDER_SITE_OTHER): Payer: Self-pay | Admitting: Internal Medicine

## 2022-11-07 ENCOUNTER — Other Ambulatory Visit (INDEPENDENT_AMBULATORY_CARE_PROVIDER_SITE_OTHER): Payer: Self-pay | Admitting: Internal Medicine

## 2022-11-10 ENCOUNTER — Other Ambulatory Visit (INDEPENDENT_AMBULATORY_CARE_PROVIDER_SITE_OTHER): Payer: Medicare HMO

## 2022-11-10 ENCOUNTER — Encounter: Payer: Self-pay | Admitting: Orthopedic Surgery

## 2022-11-10 ENCOUNTER — Ambulatory Visit (INDEPENDENT_AMBULATORY_CARE_PROVIDER_SITE_OTHER): Payer: Medicare HMO | Admitting: Orthopedic Surgery

## 2022-11-10 VITALS — BP 151/83 | HR 87 | Ht 61.0 in | Wt 172.0 lb

## 2022-11-10 DIAGNOSIS — M79645 Pain in left finger(s): Secondary | ICD-10-CM

## 2022-11-10 NOTE — Progress Notes (Signed)
Orthopaedic Clinic Return  Assessment: Jessica Russell is a 89 y.o. female with the following: Left thumb pain  Plan: Jessica Russell has acute onset of pain in the left thumb and forearm.  On physical exam, she is unable to extend her left thumb.  She has some tenderness radiating into the forearm.  There is no bruising or swelling.  She does have a history of rheumatoid arthritis, and neuropathy.  She may have sustained an injury to the tendons to the thumb, which are now allowing her to full use of the left thumb.  She was placed in a thumb spica splint today, and we will refer her to a hand specialist.    Follow-up: Return for Referral to hand specialist.   Subjective:  Chief Complaint  Patient presents with   Arm Pain    Pain down the back of the left arm after lifting an 8 pack of sodas. States her L thumb looks out of place but isn't painful has a hx of neuropathy.     History of Present Illness: Jessica Russell is a 89 y.o. female who returns to clinic for evaluation of left thumb pain.  She states that she was lifting a pack of soda yesterday, when she started to have pain and dysfunction in her left hand.  She notes decreased function of the left thumb.  Initially, she had some radiating pains in the forearm.  She has some topical treatments, as well as massage, and this improved.  She denies swelling or bruising.  She continues to have a noticeable deformity of the left thumb, as well as limited motion.  Review of Systems: No fevers or chills No numbness or tingling No chest pain No shortness of breath No bowel or bladder dysfunction No GI distress No headaches   Objective: BP (!) 151/83   Pulse 87   Ht 5\' 1"  (1.549 m)   Wt 172 lb (78 kg)   BMI 32.50 kg/m   Physical Exam:  Alert and oriented.  No acute distress.  Evaluation left thumb demonstrates no deformity.  No bruising.  No swelling.  EPL is not fully palpable.  She is unable to fully extend the  thumb.  She is unable to lift her thumb off a flat surface.  No redness into the forearm.  Mild tenderness palpation of the dorsal forearm.  Well-perfused.  IMAGING: I personally ordered and reviewed the following images:  X-rays of the left thumb were obtained in clinic today.  No acute injuries are noted.  Degenerative changes of the first [facility] joint.  No dislocation.  Decreased mineralization throughout.  Impression: Left thumb x-rays with mild degenerative changes and no acute injury   Oliver Barre, MD 11/10/2022 4:30 PM

## 2022-11-11 NOTE — Addendum Note (Signed)
Addended by: Baird Kay on: 11/11/2022 08:54 AM   Modules accepted: Orders

## 2022-11-14 ENCOUNTER — Other Ambulatory Visit (INDEPENDENT_AMBULATORY_CARE_PROVIDER_SITE_OTHER): Payer: Self-pay | Admitting: Internal Medicine

## 2022-11-25 ENCOUNTER — Telehealth: Payer: Self-pay | Admitting: Orthopedic Surgery

## 2022-11-25 NOTE — Telephone Encounter (Signed)
Called and gave pt # to [facility] and refaxed information for scheduling.

## 2022-11-25 NOTE — Telephone Encounter (Signed)
Dr. Dallas Schimke pt - pt lvm stating that she was supposed to be referred to a hand doctor and she hasn't heard anything yet.  She would like a call back [phone].

## 2022-12-16 ENCOUNTER — Telehealth: Payer: Self-pay | Admitting: Cardiovascular Disease

## 2022-12-16 NOTE — Telephone Encounter (Signed)
Called pt back. Right bottom of the foot is purple and the Great toe along with the next two toes. Just started this week. No pain noted. Her foot is numb due to neuropathy. No other symptoms noted at this time. Please advise.

## 2022-12-16 NOTE — Telephone Encounter (Signed)
Patient stated she is having discoloration in her right foot, big toe and two other toes.  Patient stated she is traveling home and will be there in an hour.

## 2022-12-16 NOTE — Telephone Encounter (Signed)
Pt returning call

## 2022-12-17 DIAGNOSIS — M7989 Other specified soft tissue disorders: Secondary | ICD-10-CM | POA: Insufficient documentation

## 2022-12-17 DIAGNOSIS — I739 Peripheral vascular disease, unspecified: Secondary | ICD-10-CM | POA: Insufficient documentation

## 2022-12-17 NOTE — Telephone Encounter (Signed)
Call to patient and she is scheduled for tomorrow with DOD.  She states understanding of going to ED if worsens or pain.

## 2022-12-17 NOTE — Telephone Encounter (Signed)
As Dr. Kirke Corin is out of the office this week, I recommend that Jessica Russell be seen today or tomorrow by Dr. Allyson Sabal or Azalee Course.  If neither are available, she may need to see the DOD to determine if this represents acute limb ischemia that would necessitate admission.  If discoloration worsens or pain develops, she should go to the ED for further evaluation.  Jessica Kendall, MD [facility]

## 2022-12-17 NOTE — Progress Notes (Signed)
Cardiology Office Note:   Date:  12/18/2022  ID:  Sofie Rower, DOB Mar 27, 1928, MRN [id number] PCP: Carylon Perches, MD  [facility] Providers Cardiologist:  Lorine Bears, MD {  History of Present Illness:   Jessica Russell is a 89 y.o. female who is here today for follow-up visit regarding peripheral arterial disease.  She has seen Dr. Kirke Corin.  She has a history of peripheral arterial disease and nonhealing ulceration on the right foot. Angiography in December 2019 showed no significant aortoiliac disease.  On the right side, there was severe stenosis in the mid anterior tibial artery with occluded dorsalis pedis distally, occluded TP trunk with reconstitution of the peroneal artery proximally via extensive collaterals from the anterior tibial and reconstitution of the distal posterior tibial artery via collaterals from the peroneal artery.  She had successful balloon angioplasty to the anterior tibial artery.  She ultimately underwent resection of first transmetatarsal bone with complete healing.    She called yesterday reporting discoloration of her toes.  She was seeing her podiatrist for routine follow-up.  I do not have access to that note.  She does not have an open wound.  She has chronic neuropathy which actually keeps her up at night.  She is not having any rest pain.  She is able to walk on it.  However, she had new discoloration on the ventral surface of the right great and second toe.  She has good popliteal pulses but diminished dorsalis pedis and posterior tibialis on both sides.  She is not having any other symptoms such as chest pressure, neck or arm discomfort.  She has no new shortness of breath, PND or orthopnea.  ROS: As stated in the HPI and negative for all other systems.  Studies Reviewed:    EKG:   NA  Risk Assessment/Calculations:              Physical Exam:   VS:  BP 134/62 (BP Location: Left Arm, Patient Position: Sitting, Cuff Size: Normal)   Pulse  69   Ht 5\' 1"  (1.549 m)   Wt 173 lb 9.6 oz (78.7 kg)   SpO2 97%   BMI 32.80 kg/m    Wt Readings from Last 3 Encounters:  12/18/22 173 lb 9.6 oz (78.7 kg)  11/10/22 172 lb (78 kg)  07/07/22 169 lb 12.8 oz (77 kg)     GEN: Well nourished, well developed in no acute distress NECK: No JVD; No carotid bruits CARDIAC: RRR, soft systolic murmur at the RUSB, nonradiating, no diastolic murmurs, rubs, gallops RESPIRATORY:  Clear to auscultation without rales, wheezing or rhonchi  ABDOMEN: Soft, non-tender, non-distended EXTREMITIES:  No edema; No deformity   ASSESSMENT AND PLAN:   Peripheral arterial disease : I reviewed this with Dr. Allyson Sabal today.  I am going to get arterial Dopplers.  I will try to have her set up to see Dr. Kirke Corin at the next available appt with these results.  She is not having any resting symptoms and has no nonhealing wound.   She is to go to the emergency room she has resting pain prior to her appointment.  Hyperlipidemia: LDL is 94 with an HDL of 259.  This is lower than previous.  Most recent lipid profile showed significant elevation in triglyceride which was 548.  This is being managed by primary care physician.   Left lower extremity swelling and tenderness:   She does not have this and had negative venous Doppler earlier this year.  Follow up Dr. Kirke Corin as scheduled.    Signed, Rollene Rotunda, MD

## 2022-12-18 ENCOUNTER — Other Ambulatory Visit: Payer: Self-pay | Admitting: Cardiology

## 2022-12-18 ENCOUNTER — Ambulatory Visit: Payer: Medicare HMO | Attending: Cardiology | Admitting: Cardiology

## 2022-12-18 ENCOUNTER — Ambulatory Visit (HOSPITAL_BASED_OUTPATIENT_CLINIC_OR_DEPARTMENT_OTHER): Payer: Medicare HMO

## 2022-12-18 ENCOUNTER — Encounter: Payer: Self-pay | Admitting: Cardiology

## 2022-12-18 VITALS — BP 134/62 | HR 69 | Ht 61.0 in | Wt 173.6 lb

## 2022-12-18 DIAGNOSIS — I739 Peripheral vascular disease, unspecified: Secondary | ICD-10-CM

## 2022-12-18 DIAGNOSIS — M7989 Other specified soft tissue disorders: Secondary | ICD-10-CM

## 2022-12-18 DIAGNOSIS — E785 Hyperlipidemia, unspecified: Secondary | ICD-10-CM

## 2022-12-18 LAB — VAS US ABI WITH/WO TBI
Left ABI: 1.12
Right ABI: 1.14

## 2022-12-18 NOTE — Patient Instructions (Signed)
Medication Instructions:  No Changes *If you need a refill on your cardiac medications before your next appointment, please call your pharmacy*   Lab Work: No labs If you have labs (blood work) drawn today and your tests are completely normal, you will receive your results only by: MyChart Message (if you have MyChart) OR A paper copy in the mail If you have any lab test that is abnormal or we need to change your treatment, we will call you to review the results.   Testing/Procedures: [address] Your physician has requested that you have a lower or upper extremity arterial duplex. This test is an ultrasound of the arteries in the legs or arms. It looks at arterial blood flow in the legs and arms. Allow one hour for Lower and Upper Arterial scans. There are no restrictions or special instructions    Follow-Up: At [facility], you and your health needs are our priority.  As part of our continuing mission to provide you with exceptional heart care, we have created designated Provider Care Teams.  These Care Teams include your primary Cardiologist (physician) and Advanced Practice Providers (APPs -  Physician Assistants and Nurse Practitioners) who all work together to provide you with the care you need, when you need it.  We recommend signing up for the patient portal called "MyChart".  Sign up information is provided on this After Visit Summary.  MyChart is used to connect with patients for Virtual Visits (Telemedicine).  Patients are able to view lab/test results, encounter notes, upcoming appointments, etc.  Non-urgent messages can be sent to your provider as well.   To learn more about what you can do with MyChart, go to [email or web address].    Your next appointment:   Keep Scheduled Appointment   Provider:   Lorine Bears, MD

## 2022-12-22 ENCOUNTER — Ambulatory Visit: Payer: Medicare HMO | Admitting: Physician Assistant

## 2022-12-24 ENCOUNTER — Encounter: Payer: Self-pay | Admitting: *Deleted

## 2023-01-02 ENCOUNTER — Other Ambulatory Visit: Payer: Self-pay | Admitting: Internal Medicine

## 2023-01-02 DIAGNOSIS — E1142 Type 2 diabetes mellitus with diabetic polyneuropathy: Secondary | ICD-10-CM

## 2023-01-02 NOTE — Progress Notes (Deleted)
Referring Provider: Carylon Perches, MD Primary Care Physician:  Carylon Perches, MD Primary GI Physician: Dr. Jena Gauss  No chief complaint on file.   HPI:   Jessica Russell is a 89 y.o. female presenting today for follow-up of constipation.  Patient was last seen in the office 08/28/2020 for constipation and abdominal cramping.  Reported she had been having trouble with constipation but had started Citrucel 1 tablespoon nightly with significant improvement, now having 1-2 bowel movements daily.  Noted some abdominal cramping letting her know she needed to have a bowel movement.  This was worse if she was constipated.  Reported having a colonoscopy in 2004 in [place] without polyps.  She was advised to continue Citrucel, follow low-fat diet, try peppermint oil or IBgard before meals, follow-up as needed.  Past Medical History:  Diagnosis Date   Diabetic nephropathy (HCC)    Foot ulcer, right, with fat layer exposed (HCC)    non-healing   History of bilateral breast cancer 04-14-2018 per pt no recurrence   dx 2000 w/ right breast cancer, s/p  lumpectomy with node dissection completed radiation/  dx 2002 w/ left breast cancer, s/p lumpectomy with node dissection,  completed radiation   History of diverticulitis of colon 2009   s/p  partial colectomy for perforated diverticulitis   History of external beam radiation therapy    2000 for right breast cancer;   2002  for left breast cancer  (per pt 36 treatment for each breast)   HLD (hyperlipidemia) 03/24/2017   Neuropathy    OA (osteoarthritis)    Osteoporosis 04/14/2017   Peripheral arterial occlusive disease (HCC)    04-13-2018   s/p  balloon angioplasty to the anterior tibial artery for severe stenosis right lower extremity (dr Kirke Corin)   Personal history of radiation therapy    both breasts   Rheumatoid arthritis (HCC) 03/13/2019   Type 2 diabetes mellitus (HCC)    Vestibular schwannoma (HCC)     S/p Gamma Knife stereotactic radiosurgery  April 2022   Vitamin D deficiency disease 03/13/2019    Past Surgical History:  Procedure Laterality Date   ABDOMINAL AORTOGRAM W/LOWER EXTREMITY N/A 04/13/2018   Procedure: ABDOMINAL AORTOGRAM W/LOWER EXTREMITY;  Surgeon: Iran Ouch, MD;  [facility];  Service: Cardiovascular;  Laterality: N/A;   APPENDECTOMY  age 58   BREAST LUMPECTOMY Right 2000   BREAST LUMPECTOMY Left 2002   BREAST LUMPECTOMY WITH AXILLARY LYMPH NODE DISSECTION Bilateral right 2000;   left 2002   BUNIONECTOMY Bilateral 1968   CARPAL TUNNEL RELEASE Right 2017   CATARACT EXTRACTION W/PHACO Right 08/30/2017   Procedure: CATARACT EXTRACTION PHACO AND INTRAOCULAR LENS PLACEMENT RIGHT EYE;  Surgeon: Gemma Payor, MD;  Location: [facility];  Service: Ophthalmology;  Laterality: Right;  CDE: 13.63   CATARACT EXTRACTION W/PHACO Left 09/20/2017   Procedure: CATARACT EXTRACTION PHACO AND INTRAOCULAR LENS PLACEMENT LEFT EYE;  Surgeon: Gemma Payor, MD;  Location: [facility];  Service: Ophthalmology;  Laterality: Left;  CDE: 11.25   D & C HYSTEROSCOPY W/ RESECTION POLYP  2017   per pt benign   FOOT SURGERY     removal of two bones   FRACTURE SURGERY Left 05/12/2016   patella fracture,   per pt no retatined hardware   GRAFT APPLICATION N/A 04/18/2018   Procedure: APPLICATION OF STRAVIX GRAFT;  Surgeon: Larey Dresser, DPM;  Location: [facility];  Service: Podiatry;  Laterality: N/A;   HEMICOLECTOMY  2009  diverticulitis w/ perforation   METATARSAL HEAD EXCISION N/A 04/18/2018   Procedure: FIRST METATARSAL HEAD RECESSION, WOUND DEBRIEDMENT;  Surgeon: Larey Dresser, DPM;  Location: [facility];  Service: Podiatry;  Laterality: N/A;   NASAL SEPTOPLASTY W/ TURBINOPLASTY Bilateral 01/04/2018   Procedure: NASAL SEPTOPLASTY WITH BILATERAL TURBINATE REDUCTION;  Surgeon: Newman Pies, MD;  Location: [facility];  Service: ENT;  Laterality: Bilateral;   PERIPHERAL VASCULAR BALLOON  ANGIOPLASTY  04/13/2018   Procedure: PERIPHERAL VASCULAR BALLOON ANGIOPLASTY;  Surgeon: Iran Ouch, MD;  [facility];  Service: Cardiovascular;;  Anterior Tibial   SKIN GRAFT     left foot    VENTRAL HERNIA REPAIR  01-07-2016    Orvil Feil, [place]   w/ mesh    Current Outpatient Medications  Medication Sig Dispense Refill   acetaminophen (TYLENOL) 500 MG tablet Take 1,500 mg by mouth at bedtime.     aspirin EC 81 MG tablet Take 1 tablet (81 mg total) by mouth daily. (Patient not taking: Reported on 12/18/2022) 90 tablet 3   atorvastatin (LIPITOR) 10 MG tablet Take by mouth. (Patient not taking: Reported on 12/18/2022)     Azelastine-Fluticasone 137-50 MCG/ACT SUSP Place 1 spray into the nose every 12 (twelve) hours. (Patient not taking: Reported on 12/18/2022) 23 g 1   benzonatate (TESSALON) 100 MG capsule Take 1 capsule (100 mg total) by mouth 2 (two) times daily as needed for cough. (Patient not taking: Reported on 12/18/2022) 20 capsule 0   CALCIUM-MAGNESIUM PO Take by mouth daily. (Patient not taking: Reported on 12/18/2022)     carboxymethylcellul-glycerin (OPTIVE) 0.5-0.9 % ophthalmic solution Apply to eye.     cholecalciferol (VITAMIN D3) 25 MCG (1000 UNIT) tablet Take 1,000 Units by mouth daily.     fluocinonide gel (LIDEX) 0.05 % Apply 1 application topically as needed (mouth sores).   0   folic acid (FOLVITE) 1 MG tablet Take 1 mg by mouth daily.     gabapentin (NEURONTIN) 400 MG capsule TAKE 2 CAPSULES (800 MG TOTAL) BY MOUTH 4 (FOUR) TIMES DAILY. 720 capsule 1   guaiFENesin (ROBITUSSIN) 100 MG/5ML liquid Take 5 mLs by mouth every 4 (four) hours as needed for cough or to loosen phlegm. (Patient not taking: Reported on 12/18/2022) 120 mL 0   ipratropium (ATROVENT) 0.03 % nasal spray PLACE 2 SPRAYS INTO BOTH NOSTRILS EVERY 12 (TWELVE) HOURS AS NEEDED FOR RHINITIS 30 mL 1   latanoprost (XALATAN) 0.005 % ophthalmic solution      levocetirizine (XYZAL) 5 MG tablet TAKE 1 TABLET BY  MOUTH EVERY DAY IN THE EVENING (Patient not taking: Reported on 12/18/2022) 90 tablet 1   Menaquinone-7 (VITAMIN K2 PO) Take by mouth daily.     metFORMIN (GLUCOPHAGE) 500 MG tablet TAKE 1 TABLET BY MOUTH EVERY DAY 90 tablet 1   methotrexate (RHEUMATREX) 2.5 MG tablet Take 15 mg by mouth once a week. Caution:Chemotherapy. Protect from light.     Methylcellulose, Laxative, (CITRUCEL PO) Take by mouth at bedtime. 1 tablespoon in 3 oz water     Multiple Vitamins-Minerals (CENTRUM SILVER PO) Take by mouth daily.     MYRBETRIQ 50 MG TB24 tablet Take 1 tablet (50 mg total) by mouth daily. 30 tablet 2   Omega-3 Fatty Acids (RA FISH OIL) 1000 MG CAPS Take by mouth daily.     Polyvinyl Alcohol-Povidone (REFRESH OP) Apply to eye 2 (two) times daily as needed.     Turmeric 1053 MG TABS Take  600 mg by mouth. With black pepper extract     vitamin B-12 (CYANOCOBALAMIN) 500 MCG tablet Take 500 mcg by mouth daily. (Patient not taking: Reported on 12/18/2022)     No current facility-administered medications for this visit.    Allergies as of 01/04/2023 - Review Complete 12/18/2022  Allergen Reaction Noted   Phenergan [promethazine hcl] Other (See Comments) 03/24/2017   Ciprofloxacin Swelling 04/05/2018   Levofloxacin Swelling 04/05/2018    Family History  Problem Relation Age of Onset   Diabetes Mother    Heart disease Mother    Arthritis Mother    Hypertension Mother    Miscarriages / [place] Mother    Early death Father    Stroke Father    Alcohol abuse Father    Diabetes Sister    Stroke Sister    Diabetes Brother    Diverticulitis Son    Diabetes Sister    Stroke Sister    Colon cancer Neg Hx     Social History   Socioeconomic History   Marital status: Married    Spouse name: Not on file   Number of children: 1   Years of education: 12   Highest education level: Not on file  Occupational History   Occupation: retired  Tobacco Use   Smoking status: Former    Current packs/day:  0.00    Average packs/day: 1 pack/day for 13.0 years (13.0 ttl pk-yrs)    Types: Cigarettes    Start date: 05/11/1950    Quit date: 05/12/1963    Years since quitting: 59.6   Smokeless tobacco: Never  Vaping Use   Vaping status: Never Used  Substance and Sexual Activity   Alcohol use: No   Drug use: No   Sexual activity: Not Currently    Birth control/protection: Post-menopausal  Other Topics Concern   Not on file  Social History Narrative   Lives with husband Chrissie Noa   One son Daphine Deutscher lives in [place]    Father was a [place] from [place], patient is bilingual   Married for over 65 years.    Caffeine use: 1 cup per day   Right handed   Social Determinants of Health   Financial Resource Strain: Low Risk  (07/14/2021)   Overall Financial Resource Strain (CARDIA)    Difficulty of Paying Living Expenses: Not hard at all  Food Insecurity: No Food Insecurity (07/14/2021)   Hunger Vital Sign    Worried About Running Out of Food in the Last Year: Never true    Ran Out of Food in the Last Year: Never true  Transportation Needs: No Transportation Needs (07/14/2021)   PRAPARE - Administrator, Civil Service (Medical): No    Lack of Transportation (Non-Medical): No  Physical Activity: Inactive (07/14/2021)   Exercise Vital Sign    Days of Exercise per Week: 0 days    Minutes of Exercise per Session: 0 min  Stress: No Stress Concern Present (07/14/2021)   [employer] of Occupational Health - Occupational Stress Questionnaire    Feeling of Stress : Only a little  Social Connections: Moderately Integrated (07/14/2021)   Social Connection and Isolation Panel [NHANES]    Frequency of Communication with Friends and Family: Once a week    Frequency of Social Gatherings with Friends and Family: More than three times a week    Attends Religious Services: More than 4 times per year    Active Member of [employer] or Organizations: No    Attends [employer]  or Organization Meetings: Never    Marital  Status: Married    Review of Systems: Gen: Denies fever, chills, anorexia. Denies fatigue, weakness, weight loss.  CV: Denies chest pain, palpitations, syncope, peripheral edema, and claudication. Resp: Denies dyspnea at rest, cough, wheezing, coughing up blood, and pleurisy. GI: Denies vomiting blood, jaundice, and fecal incontinence.   Denies dysphagia or odynophagia. Derm: Denies rash, itching, dry skin Psych: Denies depression, anxiety, memory loss, confusion. No homicidal or suicidal ideation.  Heme: Denies bruising, bleeding, and enlarged lymph nodes.  Physical Exam: There were no vitals taken for this visit. General:   Alert and oriented. No distress noted. Pleasant and cooperative.  Head:  Normocephalic and atraumatic. Eyes:  Conjuctiva clear without scleral icterus. Heart:  S1, S2 present without murmurs appreciated. Lungs:  Clear to auscultation bilaterally. No wheezes, rales, or rhonchi. No distress.  Abdomen:  +BS, soft, non-tender and non-distended. No rebound or guarding. No HSM or masses noted. Msk:  Symmetrical without gross deformities. Normal posture. Extremities:  Without edema. Neurologic:  Alert and  oriented x4 Psych:  Normal mood and affect.    Assessment:     Plan:  ***   Ermalinda Memos, PA-C [facility] 01/04/2023

## 2023-01-04 ENCOUNTER — Ambulatory Visit: Payer: Medicare HMO | Admitting: Gastroenterology

## 2023-01-05 ENCOUNTER — Ambulatory Visit: Payer: Medicare HMO | Admitting: Cardiovascular Disease

## 2023-01-20 ENCOUNTER — Other Ambulatory Visit: Payer: Self-pay | Admitting: Internal Medicine

## 2023-01-20 DIAGNOSIS — E1142 Type 2 diabetes mellitus with diabetic polyneuropathy: Secondary | ICD-10-CM

## 2023-01-25 ENCOUNTER — Encounter (INDEPENDENT_AMBULATORY_CARE_PROVIDER_SITE_OTHER): Payer: Self-pay | Admitting: Gastroenterology

## 2023-01-25 ENCOUNTER — Ambulatory Visit: Payer: Medicare HMO | Admitting: Gastroenterology

## 2023-01-25 ENCOUNTER — Ambulatory Visit (INDEPENDENT_AMBULATORY_CARE_PROVIDER_SITE_OTHER): Payer: Medicare HMO | Admitting: Gastroenterology

## 2023-01-25 VITALS — BP 127/73 | HR 72 | Temp 97.1°F | Ht 61.0 in | Wt 168.5 lb

## 2023-01-25 DIAGNOSIS — K5904 Chronic idiopathic constipation: Secondary | ICD-10-CM

## 2023-01-25 DIAGNOSIS — K5909 Other constipation: Secondary | ICD-10-CM

## 2023-01-25 NOTE — Patient Instructions (Signed)
Start Benefiber fiber supplements daily to increase stool bulk If no improvement with Benefiber, start taking Miralax 1 capful every day for one week. If bowel movements do not improve, increase to 2 capfuls every day. If after two weeks there is no improvement, increase to 2 capfuls in AM and one at night.

## 2023-01-25 NOTE — Progress Notes (Signed)
Jessica Russell, M.D. Gastroenterology & Hepatology [facility] [address] [place], [place] [address] Primary Care Physician: Carylon Perches, MD [address] [place] [address]  Referring MD: PCP  Chief Complaint:  change of bowel habits  History of Present Illness: Jessica Russell is a 89 y.o. female with PMH diabetic neuropathy, bilateral breast cancer status postlumpectomy and lymph node dissection, hyperlipidemia, osteoarthritis, diabetes, vestibular schwannoma and diverticulitis s/p partial colectomy, who presents for follow up of changes in bowel habits.  Patient reports that she has had chronic issues with constipation for almost +20 years. She reports that she has to strain to move her bowels significantly. States her first bowel movement is very hard but she has subsequent soft bowel movements.  Patient reports that she has noticed more recently her bowel movements have changed from very hard to soft bowel movements. She has been eating more fruit recently. States that sometimes she does not have a bowel movement for 1-2 days, but sometimes she can have 2-5 soft bowel movements per day. She may have some mild discomfort in her LLQ when she has had diarrhea.  She reports that she takes Citrucell as needed when she is constipated. She was taking it regularly in the pat but not recently.  States she had some urge to have a BM yesterday and had a small fecal accident.  The patient denies having any nausea, vomiting, fever, chills, hematochezia, melena, hematemesis, jaundice, pruritus or weight loss.  Most recent available labs from 03/16/2022 showed a normal CMP with calcium of 10.3 and normal renal function/hepatic function panel, normal CBC.  Last Colonoscopy:2004 possibly  - no report available  FHx: neg for any gastrointestinal/liver disease, no malignancies Social: neg smoking, alcohol or illicit drug use Surgical:  appendecotmy, partial colectomy, umbilical hernia  Past Medical History: Past Medical History:  Diagnosis Date   Diabetic nephropathy (HCC)    Foot ulcer, right, with fat layer exposed (HCC)    non-healing   History of bilateral breast cancer 04-14-2018 per pt no recurrence   dx 2000 w/ right breast cancer, s/p  lumpectomy with node dissection completed radiation/  dx 2002 w/ left breast cancer, s/p lumpectomy with node dissection,  completed radiation   History of diverticulitis of colon 2009   s/p  partial colectomy for perforated diverticulitis   History of external beam radiation therapy    2000 for right breast cancer;   2002  for left breast cancer  (per pt 36 treatment for each breast)   HLD (hyperlipidemia) 03/24/2017   Neuropathy    OA (osteoarthritis)    Osteoporosis 04/14/2017   Peripheral arterial occlusive disease (HCC)    04-13-2018   s/p  balloon angioplasty to the anterior tibial artery for severe stenosis right lower extremity (dr Kirke Corin)   Personal history of radiation therapy    both breasts   Rheumatoid arthritis (HCC) 03/13/2019   Type 2 diabetes mellitus (HCC)    Vestibular schwannoma (HCC)     S/p Gamma Knife stereotactic radiosurgery April 2022   Vitamin D deficiency disease 03/13/2019    Past Surgical History: Past Surgical History:  Procedure Laterality Date   ABDOMINAL AORTOGRAM W/LOWER EXTREMITY N/A 04/13/2018   Procedure: ABDOMINAL AORTOGRAM W/LOWER EXTREMITY;  Surgeon: Iran Ouch, MD;  [facility];  Service: Cardiovascular;  Laterality: N/A;   APPENDECTOMY  age 89   BREAST LUMPECTOMY Right 2000   BREAST LUMPECTOMY Left 2002   BREAST LUMPECTOMY  WITH AXILLARY LYMPH NODE DISSECTION Bilateral right 2000;   left 2002   BUNIONECTOMY Bilateral 1968   CARPAL TUNNEL RELEASE Right 2017   CATARACT EXTRACTION W/PHACO Right 08/30/2017   Procedure: CATARACT EXTRACTION PHACO AND INTRAOCULAR LENS PLACEMENT RIGHT EYE;  Surgeon: Gemma Payor, MD;   Location: [facility];  Service: Ophthalmology;  Laterality: Right;  CDE: 13.63   CATARACT EXTRACTION W/PHACO Left 09/20/2017   Procedure: CATARACT EXTRACTION PHACO AND INTRAOCULAR LENS PLACEMENT LEFT EYE;  Surgeon: Gemma Payor, MD;  Location: [facility];  Service: Ophthalmology;  Laterality: Left;  CDE: 11.25   D & C HYSTEROSCOPY W/ RESECTION POLYP  2017   per pt benign   FOOT SURGERY     removal of two bones   FRACTURE SURGERY Left 05/12/2016   patella fracture,   per pt no retatined hardware   GRAFT APPLICATION N/A 04/18/2018   Procedure: APPLICATION OF STRAVIX GRAFT;  Surgeon: Larey Dresser, DPM;  Location: [facility];  Service: Podiatry;  Laterality: N/A;   HEMICOLECTOMY  2009   diverticulitis w/ perforation   METATARSAL HEAD EXCISION N/A 04/18/2018   Procedure: FIRST METATARSAL HEAD RECESSION, WOUND DEBRIEDMENT;  Surgeon: Larey Dresser, DPM;  Location: [facility];  Service: Podiatry;  Laterality: N/A;   NASAL SEPTOPLASTY W/ TURBINOPLASTY Bilateral 01/04/2018   Procedure: NASAL SEPTOPLASTY WITH BILATERAL TURBINATE REDUCTION;  Surgeon: Newman Pies, MD;  Location: [facility];  Service: ENT;  Laterality: Bilateral;   PERIPHERAL VASCULAR BALLOON ANGIOPLASTY  04/13/2018   Procedure: PERIPHERAL VASCULAR BALLOON ANGIOPLASTY;  Surgeon: Iran Ouch, MD;  [facility];  Service: Cardiovascular;;  Anterior Tibial   SKIN GRAFT     left foot    VENTRAL HERNIA REPAIR  01-07-2016    Orvil Feil, [place]   w/ mesh    Family History: Family History  Problem Relation Age of Onset   Diabetes Mother    Heart disease Mother    Arthritis Mother    Hypertension Mother    Miscarriages / [place] Mother    Early death Father    Stroke Father    Alcohol abuse Father    Diabetes Sister    Stroke Sister    Diabetes Brother    Diverticulitis Son    Diabetes Sister    Stroke Sister    Colon cancer Neg Hx     Social History: Social History    Tobacco Use  Smoking Status Former   Current packs/day: 0.00   Average packs/day: 1 pack/day for 13.0 years (13.0 ttl pk-yrs)   Types: Cigarettes   Start date: 05/11/1950   Quit date: 05/12/1963   Years since quitting: 59.7  Smokeless Tobacco Never   Social History   Substance and Sexual Activity  Alcohol Use No   Social History   Substance and Sexual Activity  Drug Use No    Allergies: Allergies  Allergen Reactions   Phenergan [Promethazine Hcl] Other (See Comments)    DELIRIUM (HALLUCINATIONS)   Ciprofloxacin Swelling   Levofloxacin Swelling    Medications: Current Outpatient Medications  Medication Sig Dispense Refill   acetaminophen (TYLENOL) 500 MG tablet Take 1,500 mg by mouth at bedtime.     Azelastine-Fluticasone 137-50 MCG/ACT SUSP Place 1 spray into the nose every 12 (twelve) hours. 23 g 1   carboxymethylcellul-glycerin (OPTIVE) 0.5-0.9 % ophthalmic solution Apply to eye.     cholecalciferol (VITAMIN D3) 25 MCG (1000 UNIT) tablet Take 1,000 Units by mouth daily.  fluocinonide gel (LIDEX) 0.05 % Apply 1 application topically as needed (mouth sores).   0   folic acid (FOLVITE) 1 MG tablet Take 1 mg by mouth daily.     gabapentin (NEURONTIN) 400 MG capsule TAKE 2 CAPSULES (800 MG TOTAL) BY MOUTH 4 (FOUR) TIMES DAILY. 720 capsule 1   guaiFENesin (ROBITUSSIN) 100 MG/5ML liquid Take 5 mLs by mouth every 4 (four) hours as needed for cough or to loosen phlegm. 120 mL 0   Menaquinone-7 (VITAMIN K2 PO) Take by mouth daily.     metFORMIN (GLUCOPHAGE) 500 MG tablet TAKE 1 TABLET BY MOUTH EVERY DAY 90 tablet 1   methotrexate (RHEUMATREX) 2.5 MG tablet Take 15 mg by mouth once a week. Caution:Chemotherapy. Protect from light.     Multiple Vitamins-Minerals (CENTRUM SILVER PO) Take by mouth daily.     MYRBETRIQ 50 MG TB24 tablet Take 1 tablet (50 mg total) by mouth daily. 30 tablet 2   Omega-3 Fatty Acids (RA FISH OIL) 1000 MG CAPS Take 2 capsules by mouth daily.      Polyvinyl Alcohol-Povidone (REFRESH OP) Apply to eye 2 (two) times daily as needed.     Turmeric 1053 MG TABS Take 600 mg by mouth. With black pepper extract     No current facility-administered medications for this visit.    Review of Systems: GENERAL: negative for malaise, night sweats HEENT: No changes in hearing or vision, no nose bleeds or other nasal problems. NECK: Negative for lumps, goiter, pain and significant neck swelling RESPIRATORY: Negative for cough, wheezing CARDIOVASCULAR: Negative for chest pain, leg swelling, palpitations, orthopnea GI: SEE HPI MUSCULOSKELETAL: Negative for joint pain or swelling, back pain, and muscle pain. SKIN: Negative for lesions, rash PSYCH: Negative for sleep disturbance, mood disorder and recent psychosocial stressors. HEMATOLOGY Negative for prolonged bleeding, bruising easily, and swollen nodes. ENDOCRINE: Negative for cold or heat intolerance, polyuria, polydipsia and goiter. NEURO: negative for tremor, gait imbalance, syncope and seizures. The remainder of the review of systems is noncontributory.   Physical Exam: BP 127/73 (BP Location: Left Arm, Patient Position: Sitting, Cuff Size: Large)   Pulse 72   Temp (!) 97.1 F (36.2 C) (Temporal)   Ht 5\' 1"  (1.549 m)   Wt 168 lb 8 oz (76.4 kg)   BMI 31.84 kg/m  GENERAL: The patient is AO x3, in no acute distress. HEENT: Head is normocephalic and atraumatic. EOMI are intact. Mouth is well hydrated and without lesions. NECK: Supple. No masses LUNGS: Clear to auscultation. No presence of rhonchi/wheezing/rales. Adequate chest expansion HEART: RRR, normal s1 and s2. ABDOMEN: Soft, nontender, no guarding, no peritoneal signs, and nondistended. BS +. No masses. EXTREMITIES: Without any cyanosis, clubbing, rash, lesions or edema. NEUROLOGIC: AOx3, no focal motor deficit. SKIN: no jaundice, no rashes   Imaging/Labs: as above  I personally reviewed and interpreted the available labs,  imaging and endoscopic files.  Impression and Plan: Jessica Russell is a 89 y.o. female with PMH diabetic neuropathy, bilateral breast cancer status postlumpectomy and lymph node dissection, hyperlipidemia, osteoarthritis, diabetes, vestibular schwannoma and diverticulitis s/p partial colectomy, who presents for follow up of changes in bowel habits.  Patient reports having chronic issues with defecation that have worsened recently. No red flag signs but has presented some chronic constipation and now having multiple small loose Bms. I consider this is related to overflow diarrhea due to uncontrolled constipation. We will starr Benefiber supplements for now but if not improving will need to escalate to management  with Miralax.  - Start Benefiber fiber supplements daily to increase stool bulk - If no improvement with Benefiber, start taking Miralax 1 capful every day for one week. If bowel movements do not improve, increase to 2 capfuls every day. If after two weeks there is no improvement, increase to 2 capfuls in AM and one at night.  All questions were answered.      Jessica Blazing, MD Gastroenterology and Hepatology [facility]

## 2023-02-12 NOTE — Progress Notes (Deleted)
Cardiology Clinic Note   Patient Name: Jessica Russell Date of Encounter: 02/12/2023  Primary Care Provider:  Carylon Perches, MD Primary Cardiologist:  Lorine Bears, MD  Patient Profile    Jessica Russell 89 year old female presents to the clinic today for follow-up evaluation of her peripheral arterial disease.  Past Medical History    Past Medical History:  Diagnosis Date   Diabetic nephropathy (HCC)    Foot ulcer, right, with fat layer exposed (HCC)    non-healing   History of bilateral breast cancer 04-14-2018 per pt no recurrence   dx 2000 w/ right breast cancer, s/p  lumpectomy with node dissection completed radiation/  dx 2002 w/ left breast cancer, s/p lumpectomy with node dissection,  completed radiation   History of diverticulitis of colon 2009   s/p  partial colectomy for perforated diverticulitis   History of external beam radiation therapy    2000 for right breast cancer;   2002  for left breast cancer  (per pt 38 treatment for each breast)   HLD (hyperlipidemia) 03/24/2017   Neuropathy    OA (osteoarthritis)    Osteoporosis 04/14/2017   Peripheral arterial occlusive disease (HCC)    04-13-2018   s/p  balloon angioplasty to the anterior tibial artery for severe stenosis right lower extremity (dr Kirke Corin)   Personal history of radiation therapy    both breasts   Rheumatoid arthritis (HCC) 03/13/2019   Type 2 diabetes mellitus (HCC)    Vestibular schwannoma (HCC)     S/p Gamma Knife stereotactic radiosurgery April 2022   Vitamin D deficiency disease 03/13/2019   Past Surgical History:  Procedure Laterality Date   ABDOMINAL AORTOGRAM W/LOWER EXTREMITY N/A 04/13/2018   Procedure: ABDOMINAL AORTOGRAM W/LOWER EXTREMITY;  Surgeon: Iran Ouch, MD;  Location: [facility];  Service: Cardiovascular;  Laterality: N/A;   APPENDECTOMY  age 38   BREAST LUMPECTOMY Right 2000   BREAST LUMPECTOMY Left 2002   BREAST LUMPECTOMY WITH AXILLARY LYMPH NODE  DISSECTION Bilateral right 2000;   left 2002   BUNIONECTOMY Bilateral 1968   CARPAL TUNNEL RELEASE Right 2017   CATARACT EXTRACTION W/PHACO Right 08/30/2017   Procedure: CATARACT EXTRACTION PHACO AND INTRAOCULAR LENS PLACEMENT RIGHT EYE;  Surgeon: Gemma Payor, MD;  Location: [facility];  Service: Ophthalmology;  Laterality: Right;  CDE: 13.63   CATARACT EXTRACTION W/PHACO Left 09/20/2017   Procedure: CATARACT EXTRACTION PHACO AND INTRAOCULAR LENS PLACEMENT LEFT EYE;  Surgeon: Gemma Payor, MD;  Location: [facility];  Service: Ophthalmology;  Laterality: Left;  CDE: 11.25   D & C HYSTEROSCOPY W/ RESECTION POLYP  2017   per pt benign   FOOT SURGERY     removal of two bones   FRACTURE SURGERY Left 05/12/2016   patella fracture,   per pt no retatined hardware   GRAFT APPLICATION N/A 04/18/2018   Procedure: APPLICATION OF STRAVIX GRAFT;  Surgeon: Larey Dresser, DPM;  Location: [facility];  Service: Podiatry;  Laterality: N/A;   HEMICOLECTOMY  2009   diverticulitis w/ perforation   METATARSAL HEAD EXCISION N/A 04/18/2018   Procedure: FIRST METATARSAL HEAD RECESSION, WOUND DEBRIEDMENT;  Surgeon: Larey Dresser, DPM;  Location: [facility];  Service: Podiatry;  Laterality: N/A;   NASAL SEPTOPLASTY W/ TURBINOPLASTY Bilateral 01/04/2018   Procedure: NASAL SEPTOPLASTY WITH BILATERAL TURBINATE REDUCTION;  Surgeon: Newman Pies, MD;  Location: [facility];  Service: ENT;  Laterality: Bilateral;   PERIPHERAL VASCULAR BALLOON ANGIOPLASTY  04/13/2018  Procedure: PERIPHERAL VASCULAR BALLOON ANGIOPLASTY;  Surgeon: Iran Ouch, MD;  Location: [facility];  Service: Cardiovascular;;  Anterior Tibial   SKIN GRAFT     left foot    VENTRAL HERNIA REPAIR  01-07-2016    Orvil Feil, [place]   w/ mesh    Allergies  Allergies  Allergen Reactions   Phenergan [Promethazine Hcl] Other (See Comments)    DELIRIUM (HALLUCINATIONS)   Ciprofloxacin Swelling   Levofloxacin Swelling     History of Present Illness    Jessica Russell has a PMH of hyperlipidemia, lower extremity edema, and peripheral arterial disease.  Cardiology follows her hyperlipidemia, and PAD.   She was noted to have a nonhealing ulcer on her right foot.  She underwent angiography 12/19 which showed no significant aorto iliac disease.  She was noted to have severe stenosis on the right side in the mid anterior tibial artery with occluded dorsal PDS distally and occluded TP trunk with reconstitution of the peroneal artery proximally via extensive collaterals of the anterior tibial and reconstitution of the distal posterior tibial artery via collaterals of the peroneal artery.  She underwent successful balloon angioplasty of the anterior tibial artery.  She underwent resection of the first transmetatarsal bone and was noted to have complete healing.  She contacted the cardiology clinic on 12/17/2022.  During that time she reported discoloration of her toes.  She continued to see podiatry for follow-ups.  However, we were not able to see documentation.  She was not noted to have an open wound.  She has chronic neuropathy which was keeping her up at night.  She denied pain at rest.  She was noted to have new discoloration on the ventral surface and her great and second toes.  Her popliteal pulses were good but diminished dorsal pedis and posterior tibialis on both sides were noted.  She denied chest pain.  She denied shortness of breath orthopnea and PND.  Arterial Dopplers were ordered and follow-up with Dr. Kirke Corin was scheduled.  She presents to the clinic today for follow-up evaluation and states***.  *** denies chest pain, shortness of breath, lower extremity edema, fatigue, palpitations, melena, hematuria, hemoptysis, diaphoresis, weakness, presyncope, syncope, orthopnea, and PND.  Peripheral arterial disease-underwent ABIs and LEAs on 12/18/2022.  Results were reassuring.  Details above.  Results were shared  with Dr. Kirke Corin.  No acute management was recommended. Continue current medical therapy High-fiber diet Plan for repeat ABIs and LEA's 8/25  Hyperlipidemia-LDL***. Continue omega-3 fatty acids, turmeric High-fiber diet Maintain physical activity as tolerated  Type 2 diabetes-compliant with metformin. Follows with PCP  Lower extremity edema-generalized bilateral lower extremity nonpitting. Elevate lower extremities when not active Lower extremity support stockings Maintain physical activity as tolerated Low-sodium diet  Disposition: Follow-up with Dr. Antoine Poche or me in 6 months.  Home Medications    Prior to Admission medications   Medication Sig Start Date End Date Taking? Authorizing Provider  acetaminophen (TYLENOL) 500 MG tablet Take 1,500 mg by mouth at bedtime.    [provider]  Azelastine-Fluticasone 137-50 MCG/ACT SUSP Place 1 spray into the nose every 12 (twelve) hours. 06/10/22   Del Nigel Berthold, FNP  carboxymethylcellul-glycerin (OPTIVE) 0.5-0.9 % ophthalmic solution Apply to eye.    [provider]  cholecalciferol (VITAMIN D3) 25 MCG (1000 UNIT) tablet Take 1,000 Units by mouth daily.    [provider]  fluocinonide gel (LIDEX) 0.05 % Apply 1 application topically as needed (mouth sores).  03/14/18  [provider]  folic acid (FOLVITE) 1 MG tablet Take 1 mg by mouth daily.    [provider]  gabapentin (NEURONTIN) 400 MG capsule TAKE 2 CAPSULES (800 MG TOTAL) BY MOUTH 4 (FOUR) TIMES DAILY. 07/24/22   Billie Lade, MD  guaiFENesin (ROBITUSSIN) 100 MG/5ML liquid Take 5 mLs by mouth every 4 (four) hours as needed for cough or to loosen phlegm. 06/10/22   Del Nigel Berthold, FNP  Menaquinone-7 (VITAMIN K2 PO) Take by mouth daily.    [provider]  metFORMIN (GLUCOPHAGE) 500 MG tablet TAKE 1 TABLET BY MOUTH EVERY DAY 11/16/22   Billie Lade, MD  methotrexate (RHEUMATREX) 2.5 MG tablet Take 15 mg by  mouth once a week. Caution:Chemotherapy. Protect from light.    [provider]  Multiple Vitamins-Minerals (CENTRUM SILVER PO) Take by mouth daily.    [provider]  MYRBETRIQ 50 MG TB24 tablet Take 1 tablet (50 mg total) by mouth daily. 06/18/22 01/25/23  Billie Lade, MD  Omega-3 Fatty Acids (RA FISH OIL) 1000 MG CAPS Take 2 capsules by mouth daily.    [provider]  Polyvinyl Alcohol-Povidone (REFRESH OP) Apply to eye 2 (two) times daily as needed.    [provider]  Turmeric 1053 MG TABS Take 600 mg by mouth. With black pepper extract    [provider]    Family History    Family History  Problem Relation Age of Onset   Diabetes Mother    Heart disease Mother    Arthritis Mother    Hypertension Mother    Miscarriages / [place] Mother    Early death Father    Stroke Father    Alcohol abuse Father    Diabetes Sister    Stroke Sister    Diabetes Brother    Diverticulitis Son    Diabetes Sister    Stroke Sister    Colon cancer Neg Hx    She indicated that her mother is deceased. She indicated that her father is deceased. She indicated that both of her sisters are deceased. She indicated that her brother is deceased. She indicated that her son is alive. She indicated that the status of her neg hx is unknown.  Social History    Social History   Socioeconomic History   Marital status: Married    Spouse name: Not on file   Number of children: 1   Years of education: 12   Highest education level: Not on file  Occupational History   Occupation: retired  Tobacco Use   Smoking status: Former    Current packs/day: 0.00    Average packs/day: 1 pack/day for 13.0 years (13.0 ttl pk-yrs)    Types: Cigarettes    Start date: 05/11/1950    Quit date: 05/12/1963    Years since quitting: 59.7   Smokeless tobacco: Never  Vaping Use   Vaping status: Never Used  Substance and Sexual Activity   Alcohol use: No   Drug use: No    Sexual activity: Not Currently    Birth control/protection: Post-menopausal  Other Topics Concern   Not on file  Social History Narrative   Lives with husband Chrissie Noa   One son Daphine Deutscher lives in [place]    Father was a [place] from [place], patient is bilingual   Married for over 65 years.    Caffeine use: 1 cup per day   Right handed   Social Determinants of Corporate investment banker  Strain: Low Risk  (07/14/2021)   Overall Financial Resource Strain (CARDIA)    Difficulty of Paying Living Expenses: Not hard at all  Food Insecurity: No Food Insecurity (07/14/2021)   Hunger Vital Sign    Worried About Running Out of Food in the Last Year: Never true    Ran Out of Food in the Last Year: Never true  Transportation Needs: No Transportation Needs (07/14/2021)   PRAPARE - Administrator, Civil Service (Medical): No    Lack of Transportation (Non-Medical): No  Physical Activity: Inactive (07/14/2021)   Exercise Vital Sign    Days of Exercise per Week: 0 days    Minutes of Exercise per Session: 0 min  Stress: No Stress Concern Present (07/14/2021)   [employer] of Occupational Health - Occupational Stress Questionnaire    Feeling of Stress : Only a little  Social Connections: Moderately Integrated (07/14/2021)   Social Connection and Isolation Panel [NHANES]    Frequency of Communication with Friends and Family: Once a week    Frequency of Social Gatherings with Friends and Family: More than three times a week    Attends Religious Services: More than 4 times per year    Active Member of [employer] or Organizations: No    Attends Banker Meetings: Never    Marital Status: Married  Catering manager Violence: Not At Risk (07/14/2021)   Humiliation, Afraid, Rape, and Kick questionnaire    Fear of Current or Ex-Partner: No    Emotionally Abused: No    Physically Abused: No    Sexually Abused: No     Review of Systems    General:  No chills, fever, night sweats or  weight changes.  Cardiovascular:  No chest pain, dyspnea on exertion, edema, orthopnea, palpitations, paroxysmal nocturnal dyspnea. Dermatological: No rash, lesions/masses Respiratory: No cough, dyspnea Urologic: No hematuria, dysuria Abdominal:   No nausea, vomiting, diarrhea, bright red blood per rectum, melena, or hematemesis Neurologic:  No visual changes, wkns, changes in mental status. All other systems reviewed and are otherwise negative except as noted above.  Physical Exam    VS:  There were no vitals taken for this visit. , BMI There is no height or weight on file to calculate BMI. GEN: Well nourished, well developed, in no acute distress. HEENT: normal. Neck: Supple, no JVD, carotid bruits, or masses. Cardiac: RRR, no murmurs, rubs, or gallops. No clubbing, cyanosis, edema.  Radials/DP/PT 2+ and equal bilaterally.  Respiratory:  Respirations regular and unlabored, clear to auscultation bilaterally. GI: Soft, nontender, nondistended, BS + x 4. MS: no deformity or atrophy. Skin: warm and dry, no rash. Neuro:  Strength and sensation are intact. Psych: Normal affect.  Accessory Clinical Findings    Recent Labs: 03/16/2022: ALT 24; BUN 21; Creatinine, Ser 0.81; Hemoglobin 13.7; Platelets 193; Potassium 4.8; Sodium 139; TSH 1.180   Recent Lipid Panel    Component Value Date/Time   CHOL 197 03/16/2022 1110   TRIG 334 (H) 03/16/2022 1110   HDL 47 03/16/2022 1110   CHOLHDL 4.2 03/16/2022 1110   CHOLHDL 4.9 02/07/2020 1410   LDLCALC 94 03/16/2022 1110   LDLCALC  02/07/2020 1410     Comment:     . LDL cholesterol not calculated. Triglyceride levels greater than 400 mg/dL invalidate calculated LDL results. . Reference range: <100 . Desirable range <100 mg/dL for primary prevention;   <70 mg/dL for patients with CHD or diabetic patients  with > or = 2 CHD risk  factors. [name] LDL-C is now calculated using the Martin-Hopkins  calculation, which is a validated novel method  providing  better accuracy than the Friedewald equation in the  estimation of LDL-C.  Horald Pollen et al. Lenox Ahr. 2130;865(78): 2061-2068  (http://education.[facility].com/faq/FAQ164)     No BP recorded.  {Refresh Note OR Click here to enter BP  :1}***    ECG personally reviewed by me today- ***    Arterial brachial index is 12/18/2022  Bilateral ABIs and TBIs appear essentially unchanged compared to prior  study on 01/24/2020.    Findings reported to Dr Antoine Poche thru secure chat at 4:20 pm .  Summary:  Right: The right toe-brachial index is abnormal.  Although ankle brachial indices are within normal limits (0.95-1.29),  arterial Doppler waveforms at the ankle suggest some component of arterial  occlusive disease.  Left: The left toe-brachial index is normal.  Although ankle brachial indices are within normal limits (0.95-1.29),  arterial Doppler waveforms at the ankle suggest some component of arterial  occlusive disease.   *See table(s) above for measurements and observations.      Electronically signed by Dina Rich MD on 12/21/2022 at 3:26:43 PM.        Final   Lower extremity arterial's 12/18/2022  Findings reported to Dr. Antoine Poche thru secure chat at 4:20 pm .    Summary:  Right: No significant change compared to previous study. Moderate medial  calcification in thigh vessels with severe medial calcifications in all  three tibial vessels.     See table(s) above for measurements and observations.      Electronically signed by Dina Rich MD on 12/21/2022 at 3:46:16 PM.         Final      Assessment & Plan   1.  ***   Thomasene Ripple. Baptiste Littler NP-C     02/12/2023, 7:09 AM [facility] [address] Office [phone] Fax [phone]    I spent***minutes examining this patient, reviewing medications, and using patient centered shared decision making involving her cardiac care.  Prior to her visit I  spent greater than 20 minutes reviewing her past medical history,  medications, and prior cardiac tests.

## 2023-02-15 ENCOUNTER — Ambulatory Visit: Payer: Medicare HMO | Attending: Cardiovascular Disease | Admitting: General Practice

## 2023-02-16 ENCOUNTER — Encounter: Payer: Self-pay | Admitting: General Practice

## 2023-02-21 ENCOUNTER — Other Ambulatory Visit (INDEPENDENT_AMBULATORY_CARE_PROVIDER_SITE_OTHER): Payer: Self-pay | Admitting: Internal Medicine

## 2023-03-04 ENCOUNTER — Encounter (INDEPENDENT_AMBULATORY_CARE_PROVIDER_SITE_OTHER): Payer: Self-pay

## 2023-03-04 ENCOUNTER — Ambulatory Visit (INDEPENDENT_AMBULATORY_CARE_PROVIDER_SITE_OTHER): Payer: Medicare HMO | Admitting: Audiology

## 2023-03-04 ENCOUNTER — Ambulatory Visit (INDEPENDENT_AMBULATORY_CARE_PROVIDER_SITE_OTHER): Payer: Medicare HMO | Admitting: Otolaryngology

## 2023-03-04 VITALS — Ht 61.0 in | Wt 160.0 lb

## 2023-03-04 DIAGNOSIS — H6123 Impacted cerumen, bilateral: Secondary | ICD-10-CM | POA: Diagnosis not present

## 2023-03-04 DIAGNOSIS — J343 Hypertrophy of nasal turbinates: Secondary | ICD-10-CM | POA: Diagnosis not present

## 2023-03-04 DIAGNOSIS — D333 Benign neoplasm of cranial nerves: Secondary | ICD-10-CM

## 2023-03-04 DIAGNOSIS — H903 Sensorineural hearing loss, bilateral: Secondary | ICD-10-CM

## 2023-03-04 DIAGNOSIS — R0982 Postnasal drip: Secondary | ICD-10-CM | POA: Diagnosis not present

## 2023-03-04 DIAGNOSIS — R42 Dizziness and giddiness: Secondary | ICD-10-CM

## 2023-03-04 DIAGNOSIS — J310 Chronic rhinitis: Secondary | ICD-10-CM | POA: Diagnosis not present

## 2023-03-04 DIAGNOSIS — H9041 Sensorineural hearing loss, unilateral, right ear, with unrestricted hearing on the contralateral side: Secondary | ICD-10-CM

## 2023-03-04 DIAGNOSIS — R0981 Nasal congestion: Secondary | ICD-10-CM

## 2023-03-04 NOTE — Progress Notes (Signed)
  [address] [place], [place] [address] [phone]  Hearing Aid Check     Jessica Russell comes for a scheduled appointment for a hearing aid check.  Time in:2:00PM Time out:2:40PM Accompanied XL:[id number]   Right Left  Hearing aid manufacturer [employer]  OticonReal 3 miniRITE  Hearing aid style Receiver in the ear Receiver in the ear  Hearing aid battery rechargeable rechargeable  Receiver 3-85 3-85  Dome/ custom earpiece Open dome 8mm Open dome 8mm Also has a Mold -hard minifit with canal lock-SN:[id number]   Retention wire none none  Warranty expiration date 11-02-2024 11-02-2024  Loss and Damage     Additional accessories Expiration date Remote control [id number] Smart Charger [id number]   Initial fitting date 10-15-2021 10-15-2021  Device was fit at: [facility]  Hearing aid services Bundled until: 11-02-2024  Bundled until:11-02-2024     Chief complaint: Patient reports that the left hearing aid is not working.   Actions taken: Inspection of the device and listening check showed that there was wax underneath the wax trap of the custom earmold. The device worked well after cleaning it. The right hearing aid was also cleaned.   The patient says she struggles to put in the left aid because of the custom mold. She tried to put it in and I saw she did put it in, but with difficulty. The patient lacks sensation in her fingers. She expressed interest in switching the custom mold with a regular dome. I explained to her that typically in her situation people do better with the custom mold. [name] tried the hearing aid with the dome and she seemed to feel more comfortable putting it in. The patient will keep the left custom mold with her in case she changed her mind later on.  Services fee: $0 was paid at checkout.  Patient was oriented about how to use the app on her phone and also how to call [employer] for bluetooth questions.  Recommend: Return  for a hearing aid check , as needed. Return for a hearing evaluation and to see an ENT, if concerns with hearing changes arise.    Jessica Russell Jessica Russell, AUD

## 2023-03-08 DIAGNOSIS — H6123 Impacted cerumen, bilateral: Secondary | ICD-10-CM | POA: Insufficient documentation

## 2023-03-08 DIAGNOSIS — J310 Chronic rhinitis: Secondary | ICD-10-CM | POA: Insufficient documentation

## 2023-03-08 DIAGNOSIS — H9041 Sensorineural hearing loss, unilateral, right ear, with unrestricted hearing on the contralateral side: Secondary | ICD-10-CM | POA: Insufficient documentation

## 2023-03-08 DIAGNOSIS — J343 Hypertrophy of nasal turbinates: Secondary | ICD-10-CM | POA: Insufficient documentation

## 2023-03-08 DIAGNOSIS — R42 Dizziness and giddiness: Secondary | ICD-10-CM | POA: Insufficient documentation

## 2023-03-08 DIAGNOSIS — R0982 Postnasal drip: Secondary | ICD-10-CM | POA: Insufficient documentation

## 2023-03-08 NOTE — Progress Notes (Signed)
Patient ID: Jessica Russell, female   DOB: 1927/06/20, 89 y.o.   MRN: [id number]  Follow up: Eustachian tube dysfunction, recurrent dizziness, hearing loss, chronic nasal congestion, right internal auditory canal acoustic neuroma  HPI: The patient is a 89 year old female who returns today for follow-up evaluation of her multiple medical issues.  The patient has a history of multiple medical problems, including eustachian tube dysfunction, hearing loss, recurrent dizziness, right internal auditory canal acoustic neuroma, and chronic nasal congestion. The patient has a history of asymmetric right high-frequency sensorineural hearing loss. Her MRI scan showed a 1.6 cm right IAC acoustic neuroma. The patient was treated with gamma knife radiation in March 2022. She was also fitted with hearing aids.  At her last visit 6 months ago, the patient was treated with Flonase, Atrovent, and Valsalva exercise.  The patient returns today reporting no significant change in her dizziness and hearing loss.  Her MRI scan has not showed any increase in the size of the acoustic neuroma.  She had 2 episodes of COVID infection this year.  She has no residual neurologic deficits.  She complains of persistent nasal congestion and postnasal drainage.  She is using Atrovent nasal spray as needed to treat the nasal drainage.  Currently she denies any otalgia, otorrhea, vertigo, facial pain, or fever.  Exam: General: Communicates without difficulty, well nourished, no acute distress. Head: Normocephalic, no evidence injury, no tenderness, facial buttresses intact without stepoff. Eyes: PERRL, EOMI. No scleral icterus, conjunctivae clear. Neuro: CN II exam reveals vision grossly intact.  No nystagmus at any point of gaze. Ears: Bilateral cerumen impaction.  The left tympanic membrane and middle ear space are normal.  Nose: External evaluation reveals normal support and skin without lesions.  Dorsum is intact.  Anterior rhinoscopy  reveals congested and edematous mucosa over anterior aspect of the inferior turbinates and deviated nasal septum. Oral:  Oral cavity and oropharynx are intact, symmetric, without erythema or edema.  Mucosa is moist without lesions. Neck: Full range of motion without pain.  There is no significant lymphadenopathy.  No masses palpable.  Thyroid bed within normal limits to palpation.  Parotid glands and submandibular glands equal bilaterally without mass.  Trachea is midline. Neuro:  CN 2-12 grossly intact. Gait wide-based. Vestibular: There is no nystagmus with pneumatic pressure on either tympanic membrane or Valsalva. The cerebellar examination is unremarkable.   Procedure:  Flexible Nasal Endoscopy: Description: Risks, benefits, and alternatives of flexible endoscopy were explained to the patient.  Specific mention was made of the risk of throat numbness with difficulty swallowing, possible bleeding from the nose and mouth, and pain from the procedure.  The patient gave oral consent to proceed.  The flexible scope was inserted into the right nasal cavity.  Endoscopy of the interior nasal cavity, superior, inferior, and middle meatus was performed. The sphenoid-ethmoid recess was examined. Edematous mucosa was noted.  No polyp, mass, or lesion was appreciated. Olfactory cleft was clear.  Nasopharynx with postnasal drainage.  Turbinates were hypertrophied but without mass.  The procedure was repeated on the contralateral side with similar findings.  The patient tolerated the procedure well.   Procedure: Bilateral cerumen disimpaction Anesthesia: None Description: Under the operating microscope, the cerumen is carefully removed with a combination of cerumen currette, alligator forceps, and suction catheters.  After the cerumen is removed, the TMs are noted to be normal.  No mass, erythema, or lesions. The patient tolerated the procedure well.   Assessment: 1.  Chronic rhinitis with  nasal mucosal congestion  and bilateral inferior turbinate hypertrophy.  She also has chronic postnasal drainage. 2.  Bilateral sensorineural hearing loss, worse on the right side. 3.  Right ear acoustic neuroma, status post gamma knife radiation treatment.  Her vestibular symptoms are currently under control. 4.  Bilateral cerumen impaction.  After the disimpaction procedure, both tympanic membranes and middle ear spaces are noted to be normal.  Plan 1.  The physical exam and nasal endoscopy findings are reviewed with the patient.   2.  Continue with Flonase nasal spray and Valsalva exercise daily. 3.  Atrovent nasal spray 2 sprays each nostril twice daily to treat her chronic nasal drainage.  The patient is reassured that no infection is noted today. 4.  Continue the use of her hearing aids. 5.  The patient will return for reevaluation in 6 months.

## 2023-04-19 NOTE — Progress Notes (Unsigned)
Cardiology Clinic Note   Patient Name: Jessica Russell Date of Encounter: 04/19/2023  Primary Care Provider:  Carylon Perches, MD Primary Cardiologist:  Lorine Bears, MD  Patient Profile    Jessica Russell 89 year old female presents to the clinic today for follow-up evaluation of her peripheral arterial disease.   Past Medical History    Past Medical History:  Diagnosis Date   Diabetic nephropathy (HCC)    Foot ulcer, right, with fat layer exposed (HCC)    non-healing   History of bilateral breast cancer 04-14-2018 per pt no recurrence   dx 2000 w/ right breast cancer, s/p  lumpectomy with node dissection completed radiation/  dx 2002 w/ left breast cancer, s/p lumpectomy with node dissection,  completed radiation   History of diverticulitis of colon 2009   s/p  partial colectomy for perforated diverticulitis   History of external beam radiation therapy    2000 for right breast cancer;   2002  for left breast cancer  (per pt 36 treatment for each breast)   HLD (hyperlipidemia) 03/24/2017   Neuropathy    OA (osteoarthritis)    Osteoporosis 04/14/2017   Peripheral arterial occlusive disease (HCC)    04-13-2018   s/p  balloon angioplasty to the anterior tibial artery for severe stenosis right lower extremity (dr Kirke Corin)   Personal history of radiation therapy    both breasts   Rheumatoid arthritis (HCC) 03/13/2019   Type 2 diabetes mellitus (HCC)    Vestibular schwannoma (HCC)     S/p Gamma Knife stereotactic radiosurgery April 2022   Vitamin D deficiency disease 03/13/2019   Past Surgical History:  Procedure Laterality Date   ABDOMINAL AORTOGRAM W/LOWER EXTREMITY N/A 04/13/2018   Procedure: ABDOMINAL AORTOGRAM W/LOWER EXTREMITY;  Surgeon: Iran Ouch, MD;  Location: [facility];  Service: Cardiovascular;  Laterality: N/A;   APPENDECTOMY  age 8   BREAST LUMPECTOMY Right 2000   BREAST LUMPECTOMY Left 2002   BREAST LUMPECTOMY WITH AXILLARY LYMPH NODE  DISSECTION Bilateral right 2000;   left 2002   BUNIONECTOMY Bilateral 1968   CARPAL TUNNEL RELEASE Right 2017   CATARACT EXTRACTION W/PHACO Right 08/30/2017   Procedure: CATARACT EXTRACTION PHACO AND INTRAOCULAR LENS PLACEMENT RIGHT EYE;  Surgeon: Gemma Payor, MD;  Location: [facility];  Service: Ophthalmology;  Laterality: Right;  CDE: 13.63   CATARACT EXTRACTION W/PHACO Left 09/20/2017   Procedure: CATARACT EXTRACTION PHACO AND INTRAOCULAR LENS PLACEMENT LEFT EYE;  Surgeon: Gemma Payor, MD;  Location: [facility];  Service: Ophthalmology;  Laterality: Left;  CDE: 11.25   D & C HYSTEROSCOPY W/ RESECTION POLYP  2017   per pt benign   FOOT SURGERY     removal of two bones   FRACTURE SURGERY Left 05/12/2016   patella fracture,   per pt no retatined hardware   GRAFT APPLICATION N/A 04/18/2018   Procedure: APPLICATION OF STRAVIX GRAFT;  Surgeon: Larey Dresser, DPM;  Location: [facility];  Service: Podiatry;  Laterality: N/A;   HEMICOLECTOMY  2009   diverticulitis w/ perforation   METATARSAL HEAD EXCISION N/A 04/18/2018   Procedure: FIRST METATARSAL HEAD RECESSION, WOUND DEBRIEDMENT;  Surgeon: Larey Dresser, DPM;  Location: [facility];  Service: Podiatry;  Laterality: N/A;   NASAL SEPTOPLASTY W/ TURBINOPLASTY Bilateral 01/04/2018   Procedure: NASAL SEPTOPLASTY WITH BILATERAL TURBINATE REDUCTION;  Surgeon: Newman Pies, MD;  Location: [facility];  Service: ENT;  Laterality: Bilateral;   PERIPHERAL VASCULAR BALLOON ANGIOPLASTY  04/13/2018  Procedure: PERIPHERAL VASCULAR BALLOON ANGIOPLASTY;  Surgeon: Iran Ouch, MD;  Location: [facility];  Service: Cardiovascular;;  Anterior Tibial   SKIN GRAFT     left foot    VENTRAL HERNIA REPAIR  01-07-2016    Orvil Feil, [place]   w/ mesh    Allergies  Allergies  Allergen Reactions   Phenergan [Promethazine Hcl] Other (See Comments)    DELIRIUM (HALLUCINATIONS)   Ciprofloxacin Swelling   Levofloxacin Swelling     History of Present Illness    Jessica Russell has a PMH of hyperlipidemia, lower extremity edema, and peripheral arterial disease.   Cardiology follows her hyperlipidemia, and PAD.   She was noted to have a nonhealing ulcer on her right foot.  She underwent angiography 12/19 which showed no significant aorto iliac disease.  She was noted to have severe stenosis on the right side in the mid anterior tibial artery with occluded dorsal PDS distally and occluded TP trunk with reconstitution of the peroneal artery proximally via extensive collaterals of the anterior tibial and reconstitution of the distal posterior tibial artery via collaterals of the peroneal artery.  She underwent successful balloon angioplasty of the anterior tibial artery.  She underwent resection of the first transmetatarsal bone and was noted to have complete healing.   She contacted the cardiology clinic on 12/17/2022.  During that time she reported discoloration of her toes.  She continued to see podiatry for follow-ups.  However, we were not able to see documentation.  She was not noted to have an open wound.  She has chronic neuropathy which was keeping her up at night.  She denied pain at rest.  She was noted to have new discoloration on the ventral surface and her great and second toes.  Her popliteal pulses were good but diminished dorsal pedis and posterior tibialis on both sides were noted.  She denied chest pain.  She denied shortness of breath orthopnea and PND.  Arterial Dopplers were ordered and follow-up with Dr. Kirke Corin was scheduled.   She presents to the clinic today for follow-up evaluation and states***.   *** denies chest pain, shortness of breath, lower extremity edema, fatigue, palpitations, melena, hematuria, hemoptysis, diaphoresis, weakness, presyncope, syncope, orthopnea, and PND.   Peripheral arterial disease-underwent ABIs and LEAs on 12/18/2022.  Results were reassuring.  Details above.  Results were  shared with Dr. Kirke Corin.  No acute management was recommended. Continue current medical therapy High-fiber diet Plan for repeat ABIs and LEA's 8/25   Hyperlipidemia-LDL***. Continue omega-3 fatty acids, turmeric High-fiber diet Maintain physical activity as tolerated   Type 2 diabetes-compliant with metformin. Follows with PCP   Lower extremity edema-generalized bilateral lower extremity nonpitting. Elevate lower extremities when not active Lower extremity support stockings Maintain physical activity as tolerated Low-sodium diet   Disposition: Follow-up with Dr. Antoine Poche or me in 6 months.   Home Medications    Prior to Admission medications   Medication Sig Start Date End Date Taking? Authorizing Provider  acetaminophen (TYLENOL) 500 MG tablet Take 1,500 mg by mouth at bedtime.    [provider]  Azelastine-Fluticasone 137-50 MCG/ACT SUSP Place 1 spray into the nose every 12 (twelve) hours. 06/10/22   Del Nigel Berthold, FNP  carboxymethylcellul-glycerin (OPTIVE) 0.5-0.9 % ophthalmic solution Apply to eye.    [provider]  cholecalciferol (VITAMIN D3) 25 MCG (1000 UNIT) tablet Take 1,000 Units by mouth daily.    [provider]  fluocinonide gel (LIDEX) 0.05 %  Apply 1 application topically as needed (mouth sores).  03/14/18   [provider]  folic acid (FOLVITE) 1 MG tablet Take 1 mg by mouth daily.    [provider]  gabapentin (NEURONTIN) 400 MG capsule TAKE 2 CAPSULES (800 MG TOTAL) BY MOUTH 4 (FOUR) TIMES DAILY. 07/24/22   Billie Lade, MD  guaiFENesin (ROBITUSSIN) 100 MG/5ML liquid Take 5 mLs by mouth every 4 (four) hours as needed for cough or to loosen phlegm. 06/10/22   Del Nigel Berthold, FNP  Menaquinone-7 (VITAMIN K2 PO) Take by mouth daily.    [provider]  metFORMIN (GLUCOPHAGE) 500 MG tablet TAKE 1 TABLET BY MOUTH EVERY DAY 11/16/22   Billie Lade, MD  methotrexate (RHEUMATREX) 2.5 MG tablet  Take 15 mg by mouth once a week. Caution:Chemotherapy. Protect from light.    [provider]  Multiple Vitamins-Minerals (CENTRUM SILVER PO) Take by mouth daily.    [provider]  MYRBETRIQ 50 MG TB24 tablet Take 1 tablet (50 mg total) by mouth daily. 06/18/22 01/25/23  Billie Lade, MD  Omega-3 Fatty Acids (RA FISH OIL) 1000 MG CAPS Take 2 capsules by mouth daily.    [provider]  Polyvinyl Alcohol-Povidone (REFRESH OP) Apply to eye 2 (two) times daily as needed.    [provider]  Turmeric 1053 MG TABS Take 600 mg by mouth. With black pepper extract    [provider]    Family History    Family History  Problem Relation Age of Onset   Diabetes Mother    Heart disease Mother    Arthritis Mother    Hypertension Mother    Miscarriages / [place] Mother    Early death Father    Stroke Father    Alcohol abuse Father    Diabetes Sister    Stroke Sister    Diabetes Brother    Diverticulitis Son    Diabetes Sister    Stroke Sister    Colon cancer Neg Hx    She indicated that her mother is deceased. She indicated that her father is deceased. She indicated that both of her sisters are deceased. She indicated that her brother is deceased. She indicated that her son is alive. She indicated that the status of her neg hx is unknown.  Social History    Social History   Socioeconomic History   Marital status: Married    Spouse name: Not on file   Number of children: 1   Years of education: 12   Highest education level: Not on file  Occupational History   Occupation: retired  Tobacco Use   Smoking status: Former    Current packs/day: 0.00    Average packs/day: 1 pack/day for 13.0 years (13.0 ttl pk-yrs)    Types: Cigarettes    Start date: 05/11/1950    Quit date: 05/12/1963    Years since quitting: 59.9   Smokeless tobacco: Never  Vaping Use   Vaping status: Never Used  Substance and Sexual Activity   Alcohol use: No   Drug  use: No   Sexual activity: Not Currently    Birth control/protection: Post-menopausal  Other Topics Concern   Not on file  Social History Narrative   Lives with husband Chrissie Noa   One son Daphine Deutscher lives in [place]    Father was a [place] from [place], patient is bilingual   Married for over 65 years.    Caffeine use: 1 cup per day  Right handed   Social Determinants of Health   Financial Resource Strain: Low Risk  (07/14/2021)   Overall Financial Resource Strain (CARDIA)    Difficulty of Paying Living Expenses: Not hard at all  Food Insecurity: No Food Insecurity (07/14/2021)   Hunger Vital Sign    Worried About Running Out of Food in the Last Year: Never true    Ran Out of Food in the Last Year: Never true  Transportation Needs: No Transportation Needs (07/14/2021)   PRAPARE - Administrator, Civil Service (Medical): No    Lack of Transportation (Non-Medical): No  Physical Activity: Inactive (07/14/2021)   Exercise Vital Sign    Days of Exercise per Week: 0 days    Minutes of Exercise per Session: 0 min  Stress: No Stress Concern Present (07/14/2021)   [employer] of Occupational Health - Occupational Stress Questionnaire    Feeling of Stress : Only a little  Social Connections: Moderately Integrated (07/14/2021)   Social Connection and Isolation Panel [NHANES]    Frequency of Communication with Friends and Family: Once a week    Frequency of Social Gatherings with Friends and Family: More than three times a week    Attends Religious Services: More than 4 times per year    Active Member of [employer] or Organizations: No    Attends Banker Meetings: Never    Marital Status: Married  Catering manager Violence: Not At Risk (07/14/2021)   Humiliation, Afraid, Rape, and Kick questionnaire    Fear of Current or Ex-Partner: No    Emotionally Abused: No    Physically Abused: No    Sexually Abused: No     Review of Systems    General:  No chills, fever, night  sweats or weight changes.  Cardiovascular:  No chest pain, dyspnea on exertion, edema, orthopnea, palpitations, paroxysmal nocturnal dyspnea. Dermatological: No rash, lesions/masses Respiratory: No cough, dyspnea Urologic: No hematuria, dysuria Abdominal:   No nausea, vomiting, diarrhea, bright red blood per rectum, melena, or hematemesis Neurologic:  No visual changes, wkns, changes in mental status. All other systems reviewed and are otherwise negative except as noted above.  Physical Exam    VS:  There were no vitals taken for this visit. , BMI There is no height or weight on file to calculate BMI. GEN: Well nourished, well developed, in no acute distress. HEENT: normal. Neck: Supple, no JVD, carotid bruits, or masses. Cardiac: RRR, no murmurs, rubs, or gallops. No clubbing, cyanosis, edema.  Radials/DP/PT 2+ and equal bilaterally.  Respiratory:  Respirations regular and unlabored, clear to auscultation bilaterally. GI: Soft, nontender, nondistended, BS + x 4. MS: no deformity or atrophy. Skin: warm and dry, no rash. Neuro:  Strength and sensation are intact. Psych: Normal affect.  Accessory Clinical Findings    Recent Labs: No results found for requested labs within last 365 days.   Recent Lipid Panel    Component Value Date/Time   CHOL 197 03/16/2022 1110   TRIG 334 (H) 03/16/2022 1110   HDL 47 03/16/2022 1110   CHOLHDL 4.2 03/16/2022 1110   CHOLHDL 4.9 02/07/2020 1410   LDLCALC 94 03/16/2022 1110   LDLCALC  02/07/2020 1410     Comment:     . LDL cholesterol not calculated. Triglyceride levels greater than 400 mg/dL invalidate calculated LDL results. . Reference range: <100 . Desirable range <100 mg/dL for primary prevention;   <70 mg/dL for patients with CHD or diabetic patients  with > or =  2 CHD risk factors. [name] LDL-C is now calculated using the Martin-Hopkins  calculation, which is a validated novel method providing  better accuracy than the Friedewald  equation in the  estimation of LDL-C.  Horald Pollen et al. Lenox Ahr. 4098;119(14): 2061-2068  (http://education.[facility].com/faq/FAQ164)     No BP recorded.  {Refresh Note OR Click here to enter BP  :1}***    ECG personally reviewed by me today- ***     Arterial brachial index is 12/18/2022   Bilateral ABIs and TBIs appear essentially unchanged compared to prior  study on 01/24/2020.    Findings reported to Dr Antoine Poche thru secure chat at 4:20 pm .  Summary:  Right: The right toe-brachial index is abnormal.  Although ankle brachial indices are within normal limits (0.95-1.29),  arterial Doppler waveforms at the ankle suggest some component of arterial  occlusive disease.  Left: The left toe-brachial index is normal.  Although ankle brachial indices are within normal limits (0.95-1.29),  arterial Doppler waveforms at the ankle suggest some component of arterial  occlusive disease.   *See table(s) above for measurements and observations.      Electronically signed by Dina Rich MD on 12/21/2022 at 3:26:43 PM.        Final    Lower extremity arterial's 12/18/2022   Findings reported to Dr. Antoine Poche thru secure chat at 4:20 pm .    Summary:  Right: No significant change compared to previous study. Moderate medial  calcification in thigh vessels with severe medial calcifications in all  three tibial vessels.     See table(s) above for measurements and observations.      Electronically signed by Dina Rich MD on 12/21/2022 at 3:46:16 PM.         Final        Assessment & Plan   1.  ***   Thomasene Ripple. Galan Ghee NP-C     04/19/2023, 7:31 AM [facility] [address] Office [phone] Fax [phone]    I spent***minutes examining this patient, reviewing medications, and using patient centered shared decision making involving her cardiac care.   I spent greater than 20 minutes reviewing her past medical  history,  medications, and prior cardiac tests.

## 2023-04-21 ENCOUNTER — Ambulatory Visit: Payer: Medicare HMO | Admitting: General Practice

## 2023-05-11 ENCOUNTER — Other Ambulatory Visit (INDEPENDENT_AMBULATORY_CARE_PROVIDER_SITE_OTHER): Payer: Self-pay | Admitting: Internal Medicine

## 2023-05-24 NOTE — Progress Notes (Signed)
 Cardiology Clinic Note   Patient Name: Jessica Russell Date of Encounter: 05/25/2023  Primary Care Provider:  Sheryle Carwin, MD Primary Cardiologist:  Deatrice Cage, MD  Patient Profile    Jessica Russell 89 year old female presents to the clinic today for follow-up evaluation of her peripheral arterial disease.   Past Medical History    Past Medical History:  Diagnosis Date   Diabetic nephropathy (HCC)    Foot ulcer, right, with fat layer exposed (HCC)    non-healing   History of bilateral breast cancer 04-14-2018 per pt no recurrence   dx 2000 w/ right breast cancer, s/p  lumpectomy with node dissection completed radiation/  dx 2002 w/ left breast cancer, s/p lumpectomy with node dissection,  completed radiation   History of diverticulitis of colon 2009   s/p  partial colectomy for perforated diverticulitis   History of external beam radiation therapy    2000 for right breast cancer;   2002  for left breast cancer  (per pt 36 treatment for each breast)   HLD (hyperlipidemia) 03/24/2017   Neuropathy    OA (osteoarthritis)    Osteoporosis 04/14/2017   Peripheral arterial occlusive disease (HCC)    04-13-2018   s/p  balloon angioplasty to the anterior tibial artery for severe stenosis right lower extremity (dr cage)   Personal history of radiation therapy    both breasts   Rheumatoid arthritis (HCC) 03/13/2019   Type 2 diabetes mellitus (HCC)    Vestibular schwannoma (HCC)     S/p Gamma Knife stereotactic radiosurgery April 2022   Vitamin D  deficiency disease 03/13/2019   Past Surgical History:  Procedure Laterality Date   ABDOMINAL AORTOGRAM W/LOWER EXTREMITY N/A 04/13/2018   Procedure: ABDOMINAL AORTOGRAM W/LOWER EXTREMITY;  Surgeon: Cage Deatrice LABOR, MD;  Location: [facility];  Service: Cardiovascular;  Laterality: N/A;   APPENDECTOMY  age 68   BREAST LUMPECTOMY Right 2000   BREAST LUMPECTOMY Left 2002   BREAST LUMPECTOMY WITH AXILLARY LYMPH NODE  DISSECTION Bilateral right 2000;   left 2002   BUNIONECTOMY Bilateral 1968   CARPAL TUNNEL RELEASE Right 2017   CATARACT EXTRACTION W/PHACO Right 08/30/2017   Procedure: CATARACT EXTRACTION PHACO AND INTRAOCULAR LENS PLACEMENT RIGHT EYE;  Surgeon: Perley Hamilton, MD;  Location: [facility];  Service: Ophthalmology;  Laterality: Right;  CDE: 13.63   CATARACT EXTRACTION W/PHACO Left 09/20/2017   Procedure: CATARACT EXTRACTION PHACO AND INTRAOCULAR LENS PLACEMENT LEFT EYE;  Surgeon: Perley Hamilton, MD;  Location: [facility];  Service: Ophthalmology;  Laterality: Left;  CDE: 11.25   D & C HYSTEROSCOPY W/ RESECTION POLYP  2017   per pt benign   FOOT SURGERY     removal of two bones   FRACTURE SURGERY Left 05/12/2016   patella fracture,   per pt no retatined hardware   GRAFT APPLICATION N/A 04/18/2018   Procedure: APPLICATION OF STRAVIX GRAFT;  Surgeon: Zan Factor, DPM;  Location: [facility];  Service: Podiatry;  Laterality: N/A;   HEMICOLECTOMY  2009   diverticulitis w/ perforation   METATARSAL HEAD EXCISION N/A 04/18/2018   Procedure: FIRST METATARSAL HEAD RECESSION, WOUND DEBRIEDMENT;  Surgeon: Zan Factor, DPM;  Location: [facility];  Service: Podiatry;  Laterality: N/A;   NASAL SEPTOPLASTY W/ TURBINOPLASTY Bilateral 01/04/2018   Procedure: NASAL SEPTOPLASTY WITH BILATERAL TURBINATE REDUCTION;  Surgeon: Karis Clunes, MD;  Location: [facility];  Service: ENT;  Laterality: Bilateral;   PERIPHERAL VASCULAR BALLOON ANGIOPLASTY  04/13/2018  Procedure: PERIPHERAL VASCULAR BALLOON ANGIOPLASTY;  Surgeon: Darron Deatrice LABOR, MD;  Location: [facility];  Service: Cardiovascular;;  Anterior Tibial   SKIN GRAFT     left foot    VENTRAL HERNIA REPAIR  01-07-2016    Joylene Jewel, [place]   w/ mesh    Allergies  Allergies  Allergen Reactions   Phenergan [Promethazine Hcl] Other (See Comments)    DELIRIUM (HALLUCINATIONS)   Ciprofloxacin Swelling   Levofloxacin Swelling     History of Present Illness    Jessica Russell has a PMH of hyperlipidemia, lower extremity edema, and peripheral arterial disease.   Cardiology follows her hyperlipidemia, and PAD.   She was noted to have a nonhealing ulcer on her right foot.  She underwent angiography 12/19 which showed no significant aorto iliac disease.  She was noted to have severe stenosis on the right side in the mid anterior tibial artery with occluded dorsal PDS distally and occluded TP trunk with reconstitution of the peroneal artery proximally via extensive collaterals of the anterior tibial and reconstitution of the distal posterior tibial artery via collaterals of the peroneal artery.  She underwent successful balloon angioplasty of the anterior tibial artery.  She underwent resection of the first transmetatarsal bone and was noted to have complete healing.   She contacted the cardiology clinic on 12/17/2022.  During that time she reported discoloration of her toes.  She continued to see podiatry for follow-ups.  However, we were not able to see documentation.  She was not noted to have an open wound.  She has chronic neuropathy which was keeping her up at night.  She denied pain at rest.  She was noted to have new discoloration on the ventral surface and her great and second toes.  Her popliteal pulses were good but diminished dorsal pedis and posterior tibialis on both sides were noted.  She denied chest pain.  She denied shortness of breath orthopnea and PND.  Arterial Dopplers were ordered and follow-up with Dr. Darron was scheduled.   She presents to the clinic today for follow-up evaluation and states she is to do all of her own housework and take out the trash.  She continues to do eyedrops for her glaucoma.  Her biggest complaint today is with her right leg.  She reports that she is having a flareup with her neuropathy.  We reviewed her lower extremity ABIs and lower extremity arterial's.  She expressed  understanding.  I will continue her current medical therapy and plan follow-up in 6 months.   Today she denies chest pain, shortness of breath, lower extremity edema, fatigue, palpitations, melena, hematuria, hemoptysis, diaphoresis, weakness, presyncope, syncope, orthopnea, and PND.     Home Medications    Prior to Admission medications   Medication Sig Start Date End Date Taking? Authorizing Provider  acetaminophen  (TYLENOL ) 500 MG tablet Take 1,500 mg by mouth at bedtime.    [provider]  Azelastine -Fluticasone  137-50 MCG/ACT SUSP Place 1 spray into the nose every 12 (twelve) hours. 06/10/22   Del Wilhelmena Lloyd Sola, FNP  carboxymethylcellul-glycerin (OPTIVE) 0.5-0.9 % ophthalmic solution Apply to eye.    [provider]  cholecalciferol (VITAMIN D3) 25 MCG (1000 UNIT) tablet Take 1,000 Units by mouth daily.    [provider]  fluocinonide gel (LIDEX) 0.05 % Apply 1 application topically as needed (mouth sores).  03/14/18   [provider]  folic acid (FOLVITE) 1 MG tablet Take 1 mg by mouth daily.  [provider]  gabapentin  (NEURONTIN ) 400 MG capsule TAKE 2 CAPSULES (800 MG TOTAL) BY MOUTH 4 (FOUR) TIMES DAILY. 07/24/22   Melvenia Manus BRAVO, MD  guaiFENesin  (ROBITUSSIN) 100 MG/5ML liquid Take 5 mLs by mouth every 4 (four) hours as needed for cough or to loosen phlegm. 06/10/22   Del Wilhelmena Lloyd Sola, FNP  Menaquinone-7 (VITAMIN K2 PO) Take by mouth daily.    [provider]  metFORMIN  (GLUCOPHAGE ) 500 MG tablet TAKE 1 TABLET BY MOUTH EVERY DAY 05/11/23   Dixon, Phillip E, MD  methotrexate (RHEUMATREX) 2.5 MG tablet Take 15 mg by mouth once a week. Caution:Chemotherapy. Protect from light.    [provider]  Multiple Vitamins-Minerals (CENTRUM SILVER PO) Take by mouth daily.    [provider]  MYRBETRIQ  50 MG TB24 tablet Take 1 tablet (50 mg total) by mouth daily. 06/18/22 01/25/23  Melvenia Manus BRAVO, MD  Omega-3  Fatty Acids (RA FISH OIL) 1000 MG CAPS Take 2 capsules by mouth daily.    [provider]  Polyvinyl Alcohol-Povidone (REFRESH OP) Apply to eye 2 (two) times daily as needed.    [provider]  Turmeric 1053 MG TABS Take 600 mg by mouth. With black pepper extract    [provider]    Family History    Family History  Problem Relation Age of Onset   Diabetes Mother    Heart disease Mother    Arthritis Mother    Hypertension Mother    Miscarriages / Stillbirths Mother    Early death Father    Stroke Father    Alcohol abuse Father    Diabetes Sister    Stroke Sister    Diabetes Brother    Diverticulitis Son    Diabetes Sister    Stroke Sister    Colon cancer Neg Hx    She indicated that her mother is deceased. She indicated that her father is deceased. She indicated that both of her sisters are deceased. She indicated that her brother is deceased. She indicated that her son is alive. She indicated that the status of her neg hx is unknown.  Social History    Social History   Socioeconomic History   Marital status: Married    Spouse name: Not on file   Number of children: 1   Years of education: 12   Highest education level: Not on file  Occupational History   Occupation: retired  Tobacco Use   Smoking status: Former    Current packs/day: 0.00    Average packs/day: 1 pack/day for 13.0 years (13.0 ttl pk-yrs)    Types: Cigarettes    Start date: 05/11/1950    Quit date: 05/12/1963    Years since quitting: 60.0   Smokeless tobacco: Never  Vaping Use   Vaping status: Never Used  Substance and Sexual Activity   Alcohol use: No   Drug use: No   Sexual activity: Not Currently    Birth control/protection: Post-menopausal  Other Topics Concern   Not on file  Social History Narrative   Lives with husband Elsie   One son Gladis lives in [place]    Father was a Mexican from [place] , patient is bilingual   Married for over 65 years.    Caffeine  use: 1 cup per day   Right handed   Social Drivers of Health   Financial Resource Strain: Low Risk  (07/14/2021)   Overall Financial Resource Strain (CARDIA)    Difficulty of Paying Living  Expenses: Not hard at all  Food Insecurity: No Food Insecurity (07/14/2021)   Hunger Vital Sign    Worried About Running Out of Food in the Last Year: Never true    Ran Out of Food in the Last Year: Never true  Transportation Needs: No Transportation Needs (07/14/2021)   PRAPARE - Administrator, Civil Service (Medical): No    Lack of Transportation (Non-Medical): No  Physical Activity: Inactive (07/14/2021)   Exercise Vital Sign    Days of Exercise per Week: 0 days    Minutes of Exercise per Session: 0 min  Stress: No Stress Concern Present (07/14/2021)   [employer] of Occupational Health - Occupational Stress Questionnaire    Feeling of Stress : Only a little  Social Connections: Moderately Integrated (07/14/2021)   Social Connection and Isolation Panel [NHANES]    Frequency of Communication with Friends and Family: Once a week    Frequency of Social Gatherings with Friends and Family: More than three times a week    Attends Religious Services: More than 4 times per year    Active Member of [employer] or Organizations: No    Attends Banker Meetings: Never    Marital Status: Married  Catering Manager Violence: Not At Risk (07/14/2021)   Humiliation, Afraid, Rape, and Kick questionnaire    Fear of Current or Ex-Partner: No    Emotionally Abused: No    Physically Abused: No    Sexually Abused: No     Review of Systems    General:  No chills, fever, night sweats or weight changes.  Cardiovascular:  No chest pain, dyspnea on exertion, edema, orthopnea, palpitations, paroxysmal nocturnal dyspnea. Dermatological: No rash, lesions/masses Respiratory: No cough, dyspnea Urologic: No hematuria, dysuria Abdominal:   No nausea, vomiting, diarrhea, bright red blood per rectum, melena,  or hematemesis Neurologic:  No visual changes, wkns, changes in mental status. All other systems reviewed and are otherwise negative except as noted above.  Physical Exam    VS:  BP 132/70 (BP Location: Right Arm, Patient Position: Sitting, Cuff Size: Normal)   Pulse 67   Ht 5' 1 (1.549 m)   Wt 168 lb (76.2 kg)   SpO2 97%   BMI 31.74 kg/m  , BMI Body mass index is 31.74 kg/m. GEN: Well nourished, well developed, in no acute distress. HEENT: normal. Neck: Supple, no JVD, carotid bruits, or masses. Cardiac: RRR, no murmurs, rubs, or gallops. No clubbing, cyanosis, edema.  Radials/DP/PT 2+ and equal bilaterally.  Respiratory:  Respirations regular and unlabored, clear to auscultation bilaterally. GI: Soft, nontender, nondistended, BS + x 4. MS: no deformity or atrophy. Skin: warm and dry, no rash. Neuro:  Strength and sensation are intact. Psych: Normal affect.  Accessory Clinical Findings    Recent Labs: No results found for requested labs within last 365 days.   Recent Lipid Panel    Component Value Date/Time   CHOL 197 03/16/2022 1110   TRIG 334 (H) 03/16/2022 1110   HDL 47 03/16/2022 1110   CHOLHDL 4.2 03/16/2022 1110   CHOLHDL 4.9 02/07/2020 1410   LDLCALC 94 03/16/2022 1110   LDLCALC  02/07/2020 1410     Comment:     . LDL cholesterol not calculated. Triglyceride levels greater than 400 mg/dL invalidate calculated LDL results. . Reference range: <100 . Desirable range <100 mg/dL for primary prevention;   <70 mg/dL for patients with CHD or diabetic patients  with > or = 2 CHD risk  factors. SABRA LDL-C is now calculated using the Martin-Hopkins  calculation, which is a validated novel method providing  better accuracy than the Friedewald equation in the  estimation of LDL-C.  Gladis APPLETHWAITE et al. SANDREA. 7986;689(80): 2061-2068  (http://education.[facility].com/faq/FAQ164)          ECG personally reviewed by me today- EKG  Interpretation Date/Time:  Tuesday May 25 2023 13:49:58 EST Ventricular Rate:  67 PR Interval:  172 QRS Duration:  72 QT Interval:  392 QTC Calculation: 414 R Axis:   -17  Text Interpretation: Normal sinus rhythm Normal ECG When compared with ECG of 04-Jul-2021 20:18, PREVIOUS ECG IS PRESENT Confirmed by Emelia Hazy [phone]) on 05/25/2023 1:51:48 PM    Arterial brachial index is 12/18/2022   Bilateral ABIs and TBIs appear essentially unchanged compared to prior  study on 01/24/2020.    Findings reported to Dr Lavona thru secure chat at 4:20 pm .  Summary:  Right: The right toe-brachial index is abnormal.  Although ankle brachial indices are within normal limits (0.95-1.29),  arterial Doppler waveforms at the ankle suggest some component of arterial  occlusive disease.  Left: The left toe-brachial index is normal.  Although ankle brachial indices are within normal limits (0.95-1.29),  arterial Doppler waveforms at the ankle suggest some component of arterial  occlusive disease.   *See table(s) above for measurements and observations.      Electronically signed by Dorn Ross MD on 12/21/2022 at 3:26:43 PM.        Final    Lower extremity arterial's 12/18/2022   Findings reported to Dr. Lavona thru secure chat at 4:20 pm .    Summary:  Right: No significant change compared to previous study. Moderate medial  calcification in thigh vessels with severe medial calcifications in all  three tibial vessels.     See table(s) above for measurements and observations.      Electronically signed by Dorn Ross MD on 12/21/2022 at 3:46:16 PM.         Final      Assessment & Plan   1.  Peripheral arterial disease-underwent ABIs and LEAs on 12/18/2022.  Results were reassuring.  Details above.  Results were shared with Dr. Darron.  No acute management was recommended. Continue current medical therapy High-fiber diet Plan for repeat ABIs and LEA's 8/25    Hyperlipidemia-LDL 94 on 03/16/22. Continue omega-3 fatty acids, turmeric High-fiber diet Maintain physical activity as tolerated   Type 2 diabetes-compliant with metformin . Follows with PCP   Lower extremity edema-euvolemic.  Does note that she has slight left lower extremity swelling at the end of the day. Elevate lower extremities when not active Maintain physical activity as tolerated Low-sodium diet   Disposition: Follow-up with Dr. Lavona or me in 6 months.   Hazy HERO. [place] Landgrebe NP-C     05/25/2023, 1:52 PM [facility] [address] Office [phone] Fax [phone]    I spent 14 minutes examining this patient, reviewing medications, and using patient centered shared decision making involving their cardiac care.   I spent greater than 20 minutes reviewing their past medical history,  medications, and prior cardiac tests.

## 2023-05-25 ENCOUNTER — Encounter: Payer: Self-pay | Admitting: General Practice

## 2023-05-25 ENCOUNTER — Ambulatory Visit: Payer: Medicare HMO | Attending: General Practice | Admitting: General Practice

## 2023-05-25 VITALS — BP 132/70 | HR 67 | Ht 61.0 in | Wt 168.0 lb

## 2023-05-25 DIAGNOSIS — R2240 Localized swelling, mass and lump, unspecified lower limb: Secondary | ICD-10-CM

## 2023-05-25 DIAGNOSIS — E1149 Type 2 diabetes mellitus with other diabetic neurological complication: Secondary | ICD-10-CM

## 2023-05-25 DIAGNOSIS — E785 Hyperlipidemia, unspecified: Secondary | ICD-10-CM

## 2023-05-25 DIAGNOSIS — I739 Peripheral vascular disease, unspecified: Secondary | ICD-10-CM

## 2023-05-25 NOTE — Patient Instructions (Signed)
 Medication Instructions:  The current medical regimen is effective;  continue present plan and medications as directed. Please refer to the Current Medication list given to you today.  *If you need a refill on your cardiac medications before your next appointment, please call your pharmacy*   Lab Work: NONE  OTHER: MAINTAIN PHYSICAL ACTIVITY   Follow-Up: At [facility], you and your health needs are our priority.  As part of our continuing mission to provide you with exceptional heart care, we have created designated Provider Care Teams.  These Care Teams include your primary Cardiologist (physician) and Advanced Practice Providers (APPs -  Physician Assistants and Nurse Practitioners) who all work together to provide you with the care you need, when you need it.  Your next appointment:   6 month(s)  Provider:   Deatrice Cage, MD  or Josefa Beauvais, FNP

## 2023-06-03 ENCOUNTER — Encounter (INDEPENDENT_AMBULATORY_CARE_PROVIDER_SITE_OTHER): Payer: Self-pay | Admitting: *Deleted

## 2023-06-03 ENCOUNTER — Telehealth (INDEPENDENT_AMBULATORY_CARE_PROVIDER_SITE_OTHER): Payer: Self-pay | Admitting: *Deleted

## 2023-06-03 ENCOUNTER — Encounter (INDEPENDENT_AMBULATORY_CARE_PROVIDER_SITE_OTHER): Payer: Self-pay | Admitting: Gastroenterology

## 2023-06-03 ENCOUNTER — Ambulatory Visit (INDEPENDENT_AMBULATORY_CARE_PROVIDER_SITE_OTHER): Payer: Medicare HMO | Admitting: Gastroenterology

## 2023-06-03 VITALS — BP 120/72 | HR 69 | Temp 97.1°F | Ht 61.0 in | Wt 172.0 lb

## 2023-06-03 DIAGNOSIS — K5900 Constipation, unspecified: Secondary | ICD-10-CM | POA: Diagnosis not present

## 2023-06-03 DIAGNOSIS — R1310 Dysphagia, unspecified: Secondary | ICD-10-CM | POA: Diagnosis not present

## 2023-06-03 DIAGNOSIS — K5904 Chronic idiopathic constipation: Secondary | ICD-10-CM

## 2023-06-03 NOTE — Patient Instructions (Addendum)
Restart [facility] daily Schedule EGD

## 2023-06-03 NOTE — Progress Notes (Signed)
Jessica Russell, M.D. Gastroenterology & Hepatology [facility] [address] [place], [place] [address]  Primary Care Physician: Carylon Perches, MD [address] [place] [address]  I will communicate my assessment and recommendations to the referring MD via EMR.  Problems: Dysphagia   History of Present Illness: Jessica Russell is a 89 y.o. female with PMH diabetic neuropathy, bilateral breast cancer status postlumpectomy and lymph node dissection, hyperlipidemia, osteoarthritis, diabetes, vestibular schwannoma and diverticulitis s/p partial colectomy, who presents for follow up of constipation and dysphagia.  The patient was last seen on 01/25/2023. At that time, the patient was advised to start taking Benefiber, with backup plan of MiraLAX if symptoms did not improve.  Patient reports that she has presented intermittent episodes of feeling the food "gets stuck in her neck" - She reports this started around 6 years ago. She feels persistent discomfort in her neck when this happens - has to belch when this happens. This is more frequent when he eats bread, potatoes or dry food. She usually hs to drink more water to help the food go down. Only once she had to vomit the food. No dysphagia with liquids, but sometimes chokes. No odynophagia. States she has had heartburn recently, a few times per week - this improves with Tums.   States that after taking Benefiber, she had too many bowel movements. She is not taking any laxative. She reports she has not tried stools softeners or tried to take any other fiber supplements.  The patient denies having any nausea, vomiting, fever, chills, hematochezia, melena, hematemesis, abdominal distention, abdominal pain, diarrhea, jaundice, pruritus or weight loss.  Last EGD: never Last Colonoscopy:2004 possibly  - no report available   Past Medical History: Past Medical History:  Diagnosis Date    Diabetic nephropathy (HCC)    Foot ulcer, right, with fat layer exposed (HCC)    non-healing   History of bilateral breast cancer 04-14-2018 per pt no recurrence   dx 2000 w/ right breast cancer, s/p  lumpectomy with node dissection completed radiation/  dx 2002 w/ left breast cancer, s/p lumpectomy with node dissection,  completed radiation   History of diverticulitis of colon 2009   s/p  partial colectomy for perforated diverticulitis   History of external beam radiation therapy    2000 for right breast cancer;   2002  for left breast cancer  (per pt 36 treatment for each breast)   HLD (hyperlipidemia) 03/24/2017   Neuropathy    OA (osteoarthritis)    Osteoporosis 04/14/2017   Peripheral arterial occlusive disease (HCC)    04-13-2018   s/p  balloon angioplasty to the anterior tibial artery for severe stenosis right lower extremity (dr Kirke Corin)   Personal history of radiation therapy    both breasts   Rheumatoid arthritis (HCC) 03/13/2019   Type 2 diabetes mellitus (HCC)    Vestibular schwannoma (HCC)     S/p Gamma Knife stereotactic radiosurgery April 2022   Vitamin D deficiency disease 03/13/2019    Past Surgical History: Past Surgical History:  Procedure Laterality Date   ABDOMINAL AORTOGRAM W/LOWER EXTREMITY N/A 04/13/2018   Procedure: ABDOMINAL AORTOGRAM W/LOWER EXTREMITY;  Surgeon: Iran Ouch, MD;  [facility];  Service: Cardiovascular;  Laterality: N/A;   APPENDECTOMY  age 58   BREAST LUMPECTOMY Right 2000   BREAST LUMPECTOMY Left 2002   BREAST LUMPECTOMY WITH AXILLARY LYMPH NODE DISSECTION Bilateral right 2000;   left 2002  BUNIONECTOMY Bilateral 1968   CARPAL TUNNEL RELEASE Right 2017   CATARACT EXTRACTION W/PHACO Right 08/30/2017   Procedure: CATARACT EXTRACTION PHACO AND INTRAOCULAR LENS PLACEMENT RIGHT EYE;  Surgeon: Gemma Payor, MD;  Location: [facility];  Service: Ophthalmology;  Laterality: Right;  CDE: 13.63   CATARACT EXTRACTION W/PHACO Left  09/20/2017   Procedure: CATARACT EXTRACTION PHACO AND INTRAOCULAR LENS PLACEMENT LEFT EYE;  Surgeon: Gemma Payor, MD;  Location: [facility];  Service: Ophthalmology;  Laterality: Left;  CDE: 11.25   D & C HYSTEROSCOPY W/ RESECTION POLYP  2017   per pt benign   FOOT SURGERY     removal of two bones   FRACTURE SURGERY Left 05/12/2016   patella fracture,   per pt no retatined hardware   GRAFT APPLICATION N/A 04/18/2018   Procedure: APPLICATION OF STRAVIX GRAFT;  Surgeon: Larey Dresser, DPM;  Location: [facility];  Service: Podiatry;  Laterality: N/A;   HEMICOLECTOMY  2009   diverticulitis w/ perforation   METATARSAL HEAD EXCISION N/A 04/18/2018   Procedure: FIRST METATARSAL HEAD RECESSION, WOUND DEBRIEDMENT;  Surgeon: Larey Dresser, DPM;  Location: [facility];  Service: Podiatry;  Laterality: N/A;   NASAL SEPTOPLASTY W/ TURBINOPLASTY Bilateral 01/04/2018   Procedure: NASAL SEPTOPLASTY WITH BILATERAL TURBINATE REDUCTION;  Surgeon: Newman Pies, MD;  Location: [facility];  Service: ENT;  Laterality: Bilateral;   PERIPHERAL VASCULAR BALLOON ANGIOPLASTY  04/13/2018   Procedure: PERIPHERAL VASCULAR BALLOON ANGIOPLASTY;  Surgeon: Iran Ouch, MD;  [facility];  Service: Cardiovascular;;  Anterior Tibial   SKIN GRAFT     left foot    VENTRAL HERNIA REPAIR  01-07-2016    Orvil Feil, [place]   w/ mesh    Family History: Family History  Problem Relation Age of Onset   Diabetes Mother    Heart disease Mother    Arthritis Mother    Hypertension Mother    Miscarriages / [place] Mother    Early death Father    Stroke Father    Alcohol abuse Father    Diabetes Sister    Stroke Sister    Diabetes Brother    Diverticulitis Son    Diabetes Sister    Stroke Sister    Colon cancer Neg Hx     Social History: Social History   Tobacco Use  Smoking Status Former   Current packs/day: 0.00   Average packs/day: 1 pack/day for 13.0 years  (13.0 ttl pk-yrs)   Types: Cigarettes   Start date: 05/11/1950   Quit date: 05/12/1963   Years since quitting: 60.1  Smokeless Tobacco Never   Social History   Substance and Sexual Activity  Alcohol Use No   Social History   Substance and Sexual Activity  Drug Use No    Allergies: Allergies  Allergen Reactions   Phenergan [Promethazine Hcl] Other (See Comments)    DELIRIUM (HALLUCINATIONS)   Ciprofloxacin Swelling   Levofloxacin Swelling    Medications: Current Outpatient Medications  Medication Sig Dispense Refill   acetaminophen (TYLENOL) 500 MG tablet Take 1,500 mg by mouth at bedtime.     carboxymethylcellul-glycerin (OPTIVE) 0.5-0.9 % ophthalmic solution Apply to eye.     cholecalciferol (VITAMIN D3) 25 MCG (1000 UNIT) tablet Take 1,000 Units by mouth daily.     fluocinonide gel (LIDEX) 0.05 % Apply 1 application topically as needed (mouth sores).   0   folic acid (FOLVITE) 1 MG tablet Take 1 mg by mouth daily.  gabapentin (NEURONTIN) 400 MG capsule TAKE 2 CAPSULES (800 MG TOTAL) BY MOUTH 4 (FOUR) TIMES DAILY. 720 capsule 1   Menaquinone-7 (VITAMIN K2 PO) Take by mouth daily.     metFORMIN (GLUCOPHAGE) 500 MG tablet TAKE 1 TABLET BY MOUTH EVERY DAY 90 tablet 1   methotrexate (RHEUMATREX) 2.5 MG tablet Take 15 mg by mouth once a week. Caution:Chemotherapy. Protect from light.     Multiple Vitamins-Minerals (CENTRUM SILVER PO) Take by mouth daily.     MYRBETRIQ 50 MG TB24 tablet Take 1 tablet (50 mg total) by mouth daily. 30 tablet 2   Omega-3 Fatty Acids (RA FISH OIL) 1000 MG CAPS Take 2 capsules by mouth daily.     Polyvinyl Alcohol-Povidone (REFRESH OP) Apply to eye 2 (two) times daily as needed.     Turmeric 1053 MG TABS Take 600 mg by mouth. With black pepper extract     No current facility-administered medications for this visit.    Review of Systems: GENERAL: negative for malaise, night sweats HEENT: No changes in hearing or vision, no nose bleeds or other  nasal problems. NECK: Negative for lumps, goiter, pain and significant neck swelling RESPIRATORY: Negative for cough, wheezing CARDIOVASCULAR: Negative for chest pain, leg swelling, palpitations, orthopnea GI: SEE HPI MUSCULOSKELETAL: Negative for joint pain or swelling, back pain, and muscle pain. SKIN: Negative for lesions, rash PSYCH: Negative for sleep disturbance, mood disorder and recent psychosocial stressors. HEMATOLOGY Negative for prolonged bleeding, bruising easily, and swollen nodes. ENDOCRINE: Negative for cold or heat intolerance, polyuria, polydipsia and goiter. NEURO: negative for tremor, gait imbalance, syncope and seizures. The remainder of the review of systems is noncontributory.   Physical Exam: BP 120/72 (BP Location: Left Arm, Patient Position: Sitting, Cuff Size: Large)   Pulse 69   Temp (!) 97.1 F (36.2 C) (Temporal)   Ht 5\' 1"  (1.549 m)   Wt 172 lb (78 kg)   BMI 32.50 kg/m  GENERAL: The patient is AO x3, in no acute distress. HEENT: Head is normocephalic and atraumatic. EOMI are intact. Mouth is well hydrated and without lesions. NECK: Supple. No masses LUNGS: Clear to auscultation. No presence of rhonchi/wheezing/rales. Adequate chest expansion HEART: RRR, normal s1 and s2. ABDOMEN: Soft, nontender, no guarding, no peritoneal signs, and nondistended. BS +. No masses. EXTREMITIES: Without any cyanosis, clubbing, rash, lesions or edema. NEUROLOGIC: AOx3, no focal motor deficit. SKIN: no jaundice, no rashes  Imaging/Labs: as above  I personally reviewed and interpreted the available labs, imaging and endoscopic files.  Impression and Plan: SHALANDRIA MICHALOWSKI is a 89 y.o. female with PMH diabetic neuropathy, bilateral breast cancer status postlumpectomy and lymph node dissection, hyperlipidemia, osteoarthritis, diabetes, vestibular schwannoma and diverticulitis s/p partial colectomy, who presents for follow up of constipation and dysphagia.  Patient had  multiple episodes of diarrhea while taking Benefiber, so she decided to stop taking this supplement.  Now she is having irregular bowel movements again.  She reported having better control of his bowels in the past with the use of Citrucel which we will restart today.  In terms of her issues swallowing, this could be related to either an esophageal or oropharyngeal component.  Given the progression of her symptoms, we will evaluate this further with an EGD.  The patient is agreeable to proceed with this.  -Restart Citrucell daily -Schedule EGD  All questions were answered.      Jessica Blazing, MD Gastroenterology and Hepatology [facility]

## 2023-06-03 NOTE — Telephone Encounter (Signed)
Pt informed of pre-op appointment on Friday 06/18/23, arrive at 1:45 pm at [facility]. Verbalized understanding

## 2023-06-03 NOTE — H&P (View-Only) (Signed)
Jessica Russell, M.D. Gastroenterology & Hepatology [facility] [address] [place], [place] [address]  Primary Care Physician: Carylon Perches, MD [address] [place] [address]  I will communicate my assessment and recommendations to the referring MD via EMR.  Problems: Dysphagia   History of Present Illness: Jessica Russell is a 89 y.o. female with PMH diabetic neuropathy, bilateral breast cancer status postlumpectomy and lymph node dissection, hyperlipidemia, osteoarthritis, diabetes, vestibular schwannoma and diverticulitis s/p partial colectomy, who presents for follow up of constipation and dysphagia.  The patient was last seen on 01/25/2023. At that time, the patient was advised to start taking Benefiber, with backup plan of MiraLAX if symptoms did not improve.  Patient reports that she has presented intermittent episodes of feeling the food "gets stuck in her neck" - She reports this started around 6 years ago. She feels persistent discomfort in her neck when this happens - has to belch when this happens. This is more frequent when he eats bread, potatoes or dry food. She usually hs to drink more water to help the food go down. Only once she had to vomit the food. No dysphagia with liquids, but sometimes chokes. No odynophagia. States she has had heartburn recently, a few times per week - this improves with Tums.   States that after taking Benefiber, she had too many bowel movements. She is not taking any laxative. She reports she has not tried stools softeners or tried to take any other fiber supplements.  The patient denies having any nausea, vomiting, fever, chills, hematochezia, melena, hematemesis, abdominal distention, abdominal pain, diarrhea, jaundice, pruritus or weight loss.  Last EGD: never Last Colonoscopy:2004 possibly  - no report available   Past Medical History: Past Medical History:  Diagnosis Date    Diabetic nephropathy (HCC)    Foot ulcer, right, with fat layer exposed (HCC)    non-healing   History of bilateral breast cancer 04-14-2018 per pt no recurrence   dx 2000 w/ right breast cancer, s/p  lumpectomy with node dissection completed radiation/  dx 2002 w/ left breast cancer, s/p lumpectomy with node dissection,  completed radiation   History of diverticulitis of colon 2009   s/p  partial colectomy for perforated diverticulitis   History of external beam radiation therapy    2000 for right breast cancer;   2002  for left breast cancer  (per pt 36 treatment for each breast)   HLD (hyperlipidemia) 03/24/2017   Neuropathy    OA (osteoarthritis)    Osteoporosis 04/14/2017   Peripheral arterial occlusive disease (HCC)    04-13-2018   s/p  balloon angioplasty to the anterior tibial artery for severe stenosis right lower extremity (dr Kirke Corin)   Personal history of radiation therapy    both breasts   Rheumatoid arthritis (HCC) 03/13/2019   Type 2 diabetes mellitus (HCC)    Vestibular schwannoma (HCC)     S/p Gamma Knife stereotactic radiosurgery April 2022   Vitamin D deficiency disease 03/13/2019    Past Surgical History: Past Surgical History:  Procedure Laterality Date   ABDOMINAL AORTOGRAM W/LOWER EXTREMITY N/A 04/13/2018   Procedure: ABDOMINAL AORTOGRAM W/LOWER EXTREMITY;  Surgeon: Iran Ouch, MD;  [facility];  Service: Cardiovascular;  Laterality: N/A;   APPENDECTOMY  age 77   BREAST LUMPECTOMY Right 2000   BREAST LUMPECTOMY Left 2002   BREAST LUMPECTOMY WITH AXILLARY LYMPH NODE DISSECTION Bilateral right 2000;   left 2002  BUNIONECTOMY Bilateral 1968   CARPAL TUNNEL RELEASE Right 2017   CATARACT EXTRACTION W/PHACO Right 08/30/2017   Procedure: CATARACT EXTRACTION PHACO AND INTRAOCULAR LENS PLACEMENT RIGHT EYE;  Surgeon: Gemma Payor, MD;  Location: [facility];  Service: Ophthalmology;  Laterality: Right;  CDE: 13.63   CATARACT EXTRACTION W/PHACO Left  09/20/2017   Procedure: CATARACT EXTRACTION PHACO AND INTRAOCULAR LENS PLACEMENT LEFT EYE;  Surgeon: Gemma Payor, MD;  Location: [facility];  Service: Ophthalmology;  Laterality: Left;  CDE: 11.25   D & C HYSTEROSCOPY W/ RESECTION POLYP  2017   per pt benign   FOOT SURGERY     removal of two bones   FRACTURE SURGERY Left 05/12/2016   patella fracture,   per pt no retatined hardware   GRAFT APPLICATION N/A 04/18/2018   Procedure: APPLICATION OF STRAVIX GRAFT;  Surgeon: Larey Dresser, DPM;  Location: [facility];  Service: Podiatry;  Laterality: N/A;   HEMICOLECTOMY  2009   diverticulitis w/ perforation   METATARSAL HEAD EXCISION N/A 04/18/2018   Procedure: FIRST METATARSAL HEAD RECESSION, WOUND DEBRIEDMENT;  Surgeon: Larey Dresser, DPM;  Location: [facility];  Service: Podiatry;  Laterality: N/A;   NASAL SEPTOPLASTY W/ TURBINOPLASTY Bilateral 01/04/2018   Procedure: NASAL SEPTOPLASTY WITH BILATERAL TURBINATE REDUCTION;  Surgeon: Newman Pies, MD;  Location: [facility];  Service: ENT;  Laterality: Bilateral;   PERIPHERAL VASCULAR BALLOON ANGIOPLASTY  04/13/2018   Procedure: PERIPHERAL VASCULAR BALLOON ANGIOPLASTY;  Surgeon: Iran Ouch, MD;  [facility];  Service: Cardiovascular;;  Anterior Tibial   SKIN GRAFT     left foot    VENTRAL HERNIA REPAIR  01-07-2016    Orvil Feil, [place]   w/ mesh    Family History: Family History  Problem Relation Age of Onset   Diabetes Mother    Heart disease Mother    Arthritis Mother    Hypertension Mother    Miscarriages / [place] Mother    Early death Father    Stroke Father    Alcohol abuse Father    Diabetes Sister    Stroke Sister    Diabetes Brother    Diverticulitis Son    Diabetes Sister    Stroke Sister    Colon cancer Neg Hx     Social History: Social History   Tobacco Use  Smoking Status Former   Current packs/day: 0.00   Average packs/day: 1 pack/day for 13.0 years  (13.0 ttl pk-yrs)   Types: Cigarettes   Start date: 05/11/1950   Quit date: 05/12/1963   Years since quitting: 60.1  Smokeless Tobacco Never   Social History   Substance and Sexual Activity  Alcohol Use No   Social History   Substance and Sexual Activity  Drug Use No    Allergies: Allergies  Allergen Reactions   Phenergan [Promethazine Hcl] Other (See Comments)    DELIRIUM (HALLUCINATIONS)   Ciprofloxacin Swelling   Levofloxacin Swelling    Medications: Current Outpatient Medications  Medication Sig Dispense Refill   acetaminophen (TYLENOL) 500 MG tablet Take 1,500 mg by mouth at bedtime.     carboxymethylcellul-glycerin (OPTIVE) 0.5-0.9 % ophthalmic solution Apply to eye.     cholecalciferol (VITAMIN D3) 25 MCG (1000 UNIT) tablet Take 1,000 Units by mouth daily.     fluocinonide gel (LIDEX) 0.05 % Apply 1 application topically as needed (mouth sores).   0   folic acid (FOLVITE) 1 MG tablet Take 1 mg by mouth daily.  gabapentin (NEURONTIN) 400 MG capsule TAKE 2 CAPSULES (800 MG TOTAL) BY MOUTH 4 (FOUR) TIMES DAILY. 720 capsule 1   Menaquinone-7 (VITAMIN K2 PO) Take by mouth daily.     metFORMIN (GLUCOPHAGE) 500 MG tablet TAKE 1 TABLET BY MOUTH EVERY DAY 90 tablet 1   methotrexate (RHEUMATREX) 2.5 MG tablet Take 15 mg by mouth once a week. Caution:Chemotherapy. Protect from light.     Multiple Vitamins-Minerals (CENTRUM SILVER PO) Take by mouth daily.     MYRBETRIQ 50 MG TB24 tablet Take 1 tablet (50 mg total) by mouth daily. 30 tablet 2   Omega-3 Fatty Acids (RA FISH OIL) 1000 MG CAPS Take 2 capsules by mouth daily.     Polyvinyl Alcohol-Povidone (REFRESH OP) Apply to eye 2 (two) times daily as needed.     Turmeric 1053 MG TABS Take 600 mg by mouth. With black pepper extract     No current facility-administered medications for this visit.    Review of Systems: GENERAL: negative for malaise, night sweats HEENT: No changes in hearing or vision, no nose bleeds or other  nasal problems. NECK: Negative for lumps, goiter, pain and significant neck swelling RESPIRATORY: Negative for cough, wheezing CARDIOVASCULAR: Negative for chest pain, leg swelling, palpitations, orthopnea GI: SEE HPI MUSCULOSKELETAL: Negative for joint pain or swelling, back pain, and muscle pain. SKIN: Negative for lesions, rash PSYCH: Negative for sleep disturbance, mood disorder and recent psychosocial stressors. HEMATOLOGY Negative for prolonged bleeding, bruising easily, and swollen nodes. ENDOCRINE: Negative for cold or heat intolerance, polyuria, polydipsia and goiter. NEURO: negative for tremor, gait imbalance, syncope and seizures. The remainder of the review of systems is noncontributory.   Physical Exam: BP 120/72 (BP Location: Left Arm, Patient Position: Sitting, Cuff Size: Large)   Pulse 69   Temp (!) 97.1 F (36.2 C) (Temporal)   Ht 5\' 1"  (1.549 m)   Wt 172 lb (78 kg)   BMI 32.50 kg/m  GENERAL: The patient is AO x3, in no acute distress. HEENT: Head is normocephalic and atraumatic. EOMI are intact. Mouth is well hydrated and without lesions. NECK: Supple. No masses LUNGS: Clear to auscultation. No presence of rhonchi/wheezing/rales. Adequate chest expansion HEART: RRR, normal s1 and s2. ABDOMEN: Soft, nontender, no guarding, no peritoneal signs, and nondistended. BS +. No masses. EXTREMITIES: Without any cyanosis, clubbing, rash, lesions or edema. NEUROLOGIC: AOx3, no focal motor deficit. SKIN: no jaundice, no rashes  Imaging/Labs: as above  I personally reviewed and interpreted the available labs, imaging and endoscopic files.  Impression and Plan: FRANCES AMBROSINO is a 89 y.o. female with PMH diabetic neuropathy, bilateral breast cancer status postlumpectomy and lymph node dissection, hyperlipidemia, osteoarthritis, diabetes, vestibular schwannoma and diverticulitis s/p partial colectomy, who presents for follow up of constipation and dysphagia.  Patient had  multiple episodes of diarrhea while taking Benefiber, so she decided to stop taking this supplement.  Now she is having irregular bowel movements again.  She reported having better control of his bowels in the past with the use of Citrucel which we will restart today.  In terms of her issues swallowing, this could be related to either an esophageal or oropharyngeal component.  Given the progression of her symptoms, we will evaluate this further with an EGD.  The patient is agreeable to proceed with this.  -Restart Citrucell daily -Schedule EGD  All questions were answered.      Jessica Blazing, MD Gastroenterology and Hepatology [facility] Gastroenterology

## 2023-06-18 ENCOUNTER — Encounter (HOSPITAL_COMMUNITY)
Admission: RE | Admit: 2023-06-18 | Discharge: 2023-06-18 | Disposition: A | Discharge status: Home / Self Care | Payer: Medicare HMO | Source: Ambulatory Visit | Attending: Gastroenterology

## 2023-06-18 ENCOUNTER — Encounter (HOSPITAL_COMMUNITY): Payer: Self-pay

## 2023-06-18 HISTORY — DX: Diverticulitis of intestine, part unspecified, without perforation or abscess without bleeding: K57.92

## 2023-06-22 ENCOUNTER — Ambulatory Visit (HOSPITAL_BASED_OUTPATIENT_CLINIC_OR_DEPARTMENT_OTHER): Payer: Medicare HMO | Admitting: Certified Registered"

## 2023-06-22 ENCOUNTER — Ambulatory Visit (HOSPITAL_COMMUNITY): Payer: Medicare HMO | Admitting: Certified Registered"

## 2023-06-22 ENCOUNTER — Telehealth (INDEPENDENT_AMBULATORY_CARE_PROVIDER_SITE_OTHER): Payer: Self-pay | Admitting: Audiology

## 2023-06-22 ENCOUNTER — Ambulatory Visit (HOSPITAL_COMMUNITY)
Admission: RE | Admit: 2023-06-22 | Discharge: 2023-06-22 | Disposition: A | Discharge status: Home / Self Care | Payer: Medicare HMO | Attending: Gastroenterology | Admitting: Gastroenterology

## 2023-06-22 ENCOUNTER — Encounter (HOSPITAL_COMMUNITY)
Admission: RE | Disposition: A | Discharge status: Home / Self Care | Payer: Self-pay | Source: Home / Self Care | Attending: Gastroenterology

## 2023-06-22 DIAGNOSIS — R1310 Dysphagia, unspecified: Secondary | ICD-10-CM

## 2023-06-22 DIAGNOSIS — E1151 Type 2 diabetes mellitus with diabetic peripheral angiopathy without gangrene: Secondary | ICD-10-CM | POA: Insufficient documentation

## 2023-06-22 DIAGNOSIS — M1990 Unspecified osteoarthritis, unspecified site: Secondary | ICD-10-CM | POA: Insufficient documentation

## 2023-06-22 DIAGNOSIS — M069 Rheumatoid arthritis, unspecified: Secondary | ICD-10-CM | POA: Diagnosis not present

## 2023-06-22 DIAGNOSIS — K222 Esophageal obstruction: Secondary | ICD-10-CM

## 2023-06-22 DIAGNOSIS — Z9049 Acquired absence of other specified parts of digestive tract: Secondary | ICD-10-CM | POA: Insufficient documentation

## 2023-06-22 DIAGNOSIS — E1142 Type 2 diabetes mellitus with diabetic polyneuropathy: Secondary | ICD-10-CM | POA: Insufficient documentation

## 2023-06-22 DIAGNOSIS — E785 Hyperlipidemia, unspecified: Secondary | ICD-10-CM | POA: Diagnosis not present

## 2023-06-22 DIAGNOSIS — Z79631 Long term (current) use of antimetabolite agent: Secondary | ICD-10-CM | POA: Diagnosis not present

## 2023-06-22 DIAGNOSIS — K2980 Duodenitis without bleeding: Secondary | ICD-10-CM

## 2023-06-22 DIAGNOSIS — Z853 Personal history of malignant neoplasm of breast: Secondary | ICD-10-CM | POA: Insufficient documentation

## 2023-06-22 DIAGNOSIS — Z87891 Personal history of nicotine dependence: Secondary | ICD-10-CM | POA: Insufficient documentation

## 2023-06-22 DIAGNOSIS — K449 Diaphragmatic hernia without obstruction or gangrene: Secondary | ICD-10-CM

## 2023-06-22 DIAGNOSIS — Z79899 Other long term (current) drug therapy: Secondary | ICD-10-CM | POA: Diagnosis not present

## 2023-06-22 DIAGNOSIS — E669 Obesity, unspecified: Secondary | ICD-10-CM | POA: Diagnosis not present

## 2023-06-22 DIAGNOSIS — Z7984 Long term (current) use of oral hypoglycemic drugs: Secondary | ICD-10-CM | POA: Diagnosis not present

## 2023-06-22 DIAGNOSIS — K5900 Constipation, unspecified: Secondary | ICD-10-CM | POA: Insufficient documentation

## 2023-06-22 HISTORY — PX: ESOPHAGOGASTRODUODENOSCOPY (EGD) WITH PROPOFOL: SHX5813

## 2023-06-22 LAB — GLUCOSE, CAPILLARY: Glucose-Capillary: 92 mg/dL (ref 70–99)

## 2023-06-22 SURGERY — Balloon dilation wire-guided
Anesthesia: General

## 2023-06-22 MED ORDER — LIDOCAINE HCL (PF) 2 % IJ SOLN
INTRAMUSCULAR | Status: DC | PRN
Start: 2023-06-22 — End: 2023-06-22
  Administered 2023-06-22: 100 mg via INTRADERMAL

## 2023-06-22 MED ORDER — PROPOFOL 500 MG/50ML IV EMUL
INTRAVENOUS | Status: DC | PRN
  Administered 2023-06-22: 100 ug/kg/min via INTRAVENOUS

## 2023-06-22 MED ORDER — OMEPRAZOLE 20 MG PO CPDR: 20.0000 mg | DELAYED_RELEASE_CAPSULE | Freq: Every day | ORAL | 3 refills | Status: AC

## 2023-06-22 MED ORDER — PHENYLEPHRINE 80 MCG/ML (10ML) SYRINGE FOR IV PUSH (FOR BLOOD PRESSURE SUPPORT)
PREFILLED_SYRINGE | INTRAVENOUS | Status: AC
  Filled 2023-06-22: qty 10

## 2023-06-22 MED ORDER — PROPOFOL 10 MG/ML IV BOLUS
INTRAVENOUS | Status: DC | PRN
  Administered 2023-06-22: 80 mg via INTRAVENOUS
  Administered 2023-06-22: 30 mg via INTRAVENOUS

## 2023-06-22 MED ORDER — PHENYLEPHRINE 80 MCG/ML (10ML) SYRINGE FOR IV PUSH (FOR BLOOD PRESSURE SUPPORT)
PREFILLED_SYRINGE | INTRAVENOUS | Status: DC | PRN
Start: 2023-06-22 — End: 2023-06-22
  Administered 2023-06-22: 160 ug via INTRAVENOUS

## 2023-06-22 MED ORDER — LACTATED RINGERS IV SOLN: INTRAVENOUS | Status: DC | PRN

## 2023-06-22 NOTE — Op Note (Signed)
[facility] Patient Name: Jessica Russell Procedure Date: 06/22/2023 11:19 AM MRN: [id number] Date of Birth: 1927-07-07 Attending MD: Katrinka Blazing , , [id number] CSN: [id number] Age: 89 Admit Type: Outpatient Procedure:                Upper GI endoscopy Indications:              Dysphagia Providers:                Katrinka Blazing, Nena Polio, RN, Kristine L. Jessee Avers, Technician Referring MD:             Katrinka Blazing Medicines:                Monitored Anesthesia Care Complications:            No immediate complications. Estimated Blood Loss:     Estimated blood loss: none. Procedure:                Pre-Anesthesia Assessment:                           - Prior to the procedure, a History and Physical                            was performed, and patient medications, allergies                            and sensitivities were reviewed. The patient's                            tolerance of previous anesthesia was reviewed.                           - The risks and benefits of the procedure and the                            sedation options and risks were discussed with the                            patient. All questions were answered and informed                            consent was obtained.                           - ASA Grade Assessment: III - A patient with severe                            systemic disease.                           After obtaining informed consent, the endoscope was                            passed under direct vision. Throughout the  procedure, the patient's blood pressure, pulse, and                            oxygen saturations were monitored continuously. The                            GIF-H190 ([id number]) scope was introduced through the                            mouth, and advanced to the second part of duodenum.                            The upper GI endoscopy was accomplished  without                            difficulty. The patient tolerated the procedure                            well. Scope In: 11:37:33 AM Scope Out: 11:46:30 AM Total Procedure Duration: 0 hours 8 minutes 57 seconds  Findings:      A non-obstructing Schatzki ring was found at the gastroesophageal       junction. A TTS dilator was passed through the scope. Dilation with a       15-16.5-18 mm balloon dilator was performed to 18 mm. No mucosal       disruption was observed.      A 2 cm hiatal hernia was present.      The stomach was normal.      The examined duodenum was normal. Impression:               - Non-obstructing Schatzki ring. Dilated.                           - 2 cm hiatal hernia.                           - Normal stomach.                           - Normal examined duodenum.                           - No specimens collected. Moderate Sedation:      Per Anesthesia Care Recommendation:           - Discharge patient to home (ambulatory).                           - Resume previous diet.                           - Use Prilosec (omeprazole) 20 mg PO daily. Procedure Code(s):        --- Professional ---                           [phone], Esophagogastroduodenoscopy, flexible,  transoral; with transendoscopic balloon dilation of                            esophagus (less than 30 mm diameter) Diagnosis Code(s):        --- Professional ---                           K22.2, Esophageal obstruction                           K44.9, Diaphragmatic hernia without obstruction or                            gangrene                           R13.10, Dysphagia, unspecified CPT copyright 2022 American Medical Association. All rights reserved. The codes documented in this report are preliminary and upon coder review may  be revised to meet current compliance requirements. Katrinka Blazing, MD Katrinka Blazing,  06/22/2023 11:53:07 AM This report has been signed  electronically. Number of Addenda: 0

## 2023-06-22 NOTE — Anesthesia Preprocedure Evaluation (Signed)
Anesthesia Evaluation  Patient identified by MRN, date of birth, ID band Patient awake    Reviewed: Allergy & Precautions, H&P , NPO status , Patient's Chart, lab work & pertinent test results, reviewed documented beta blocker date and time   Airway Mallampati: II  TM Distance: >3 FB Neck ROM: full    Dental no notable dental hx. (+) Implants   Pulmonary neg pulmonary ROS, former smoker   Pulmonary exam normal breath sounds clear to auscultation       Cardiovascular Exercise Tolerance: Good + Peripheral Vascular Disease  Normal cardiovascular exam Rhythm:regular Rate:Normal     Neuro/Psych Peripheral neuropathy  Neuromuscular disease  negative psych ROS   GI/Hepatic negative GI ROS, Neg liver ROS,,,  Endo/Other  negative endocrine ROSdiabetes    Renal/GU Renal disease  negative genitourinary   Musculoskeletal  (+) Arthritis ,    Abdominal  (+) + obese  Peds  Hematology negative hematology ROS (+)   Anesthesia Other Findings   Reproductive/Obstetrics negative OB ROS                             Anesthesia Physical Anesthesia Plan  ASA: 3  Anesthesia Plan: General   Post-op Pain Management: Minimal or no pain anticipated   Induction: Intravenous  PONV Risk Score and Plan: 1  Airway Management Planned: Simple Face Mask  Additional Equipment:   Intra-op Plan:   Post-operative Plan:   Informed Consent: I have reviewed the patients History and Physical, chart, labs and discussed the procedure including the risks, benefits and alternatives for the proposed anesthesia with the patient or authorized representative who has indicated his/her understanding and acceptance.     Dental Advisory Given  Plan Discussed with: CRNA  Anesthesia Plan Comments:        Anesthesia Quick Evaluation

## 2023-06-22 NOTE — Transfer of Care (Signed)
Immediate Anesthesia Transfer of Care Note  Patient: Jessica Russell  Procedure(s) Performed: Balloon dilation wire-guided  Patient Location: Short Stay  Anesthesia Type:General  Level of Consciousness: drowsy  Airway & Oxygen Therapy: Patient Spontanous Breathing  Post-op Assessment: Report given to RN and Post -op Vital signs reviewed and stable  Post vital signs: Reviewed and stable  Last Vitals:  Vitals Value Taken Time  BP    Temp    Pulse    Resp    SpO2      Last Pain:  Vitals:   06/22/23 1022  PainSc: 0-No pain         Complications: No notable events documented.

## 2023-06-22 NOTE — Telephone Encounter (Signed)
Called patient after she left a voice message asking for help with her phone-hearing aids connection. No answer when I called. I left a voice message to let her know how to contact [employer]'s consumer line or to call us back and ask for a hearing aid check appointment with the audiologist.

## 2023-06-22 NOTE — Interval H&P Note (Signed)
History and Physical Interval Note:  06/22/2023 10:53 AM  Jessica Russell  has presented today for surgery, with the diagnosis of dysphagia.  The various methods of treatment have been discussed with the patient and family. After consideration of risks, benefits and other options for treatment, the patient has consented to  Procedure(s) with comments: ESOPHAGOGASTRODUODENOSCOPY (EGD) WITH PROPOFOL (N/A) - 11:30 am, asa 3 as a surgical intervention.  The patient's history has been reviewed, patient examined, no change in status, stable for surgery.  I have reviewed the patient's chart and labs.  Questions were answered to the patient's satisfaction.     Katrinka Blazing Mayorga

## 2023-06-22 NOTE — Anesthesia Procedure Notes (Signed)
Date/Time: 06/22/2023 11:32 AM  Performed by: Julian Reil, CRNAPre-anesthesia Checklist: Patient identified, Emergency Drugs available, Suction available and Patient being monitored Patient Re-evaluated:Patient Re-evaluated prior to induction Oxygen Delivery Method: Nasal cannula Induction Type: IV induction Placement Confirmation: positive ETCO2 Comments: Optiflow High Flow [place] O2 used

## 2023-06-22 NOTE — Discharge Instructions (Addendum)
You are being discharged to home.  Resume your previous diet.  Take Prilosec (omeprazole) 20 mg by mouth once a day.

## 2023-06-22 NOTE — Anesthesia Postprocedure Evaluation (Signed)
Anesthesia Post Note  Patient: Sammuel Bailiff Gaida  Procedure(s) Performed: Balloon dilation wire-guided  Patient location during evaluation: PACU Anesthesia Type: General Level of consciousness: awake and alert Pain management: pain level controlled Vital Signs Assessment: post-procedure vital signs reviewed and stable Respiratory status: spontaneous breathing, nonlabored ventilation and respiratory function stable Cardiovascular status: blood pressure returned to baseline and stable Postop Assessment: no apparent nausea or vomiting Anesthetic complications: no   No notable events documented.   Last Vitals:  Vitals:   06/22/23 1040 06/22/23 1149  BP:  124/61  Pulse: 60 63  Resp: 12 10  Temp:  (!) 36.1 C  SpO2: 97% 97%    Last Pain:  Vitals:   06/22/23 1149  TempSrc: Axillary  PainSc:                  Roslynn Amble

## 2023-06-23 ENCOUNTER — Encounter (HOSPITAL_COMMUNITY): Payer: Self-pay | Admitting: Gastroenterology

## 2023-06-24 ENCOUNTER — Ambulatory Visit (INDEPENDENT_AMBULATORY_CARE_PROVIDER_SITE_OTHER): Payer: Self-pay | Admitting: Audiology

## 2023-06-24 DIAGNOSIS — H903 Sensorineural hearing loss, bilateral: Secondary | ICD-10-CM

## 2023-06-25 NOTE — Progress Notes (Signed)
  [address], [address] [place], [place] [address] [phone]   Hearing Aid Check        Jessica Russell comes for a scheduled appointment for a hearing aid check.   Time in:2:00PM Time out:2:40PM Accompanied NU:[id number]     Right Left  Hearing aid manufacturer [employer]  [employer]Real 3 miniRITE  Hearing aid style Receiver in the ear Receiver in the ear  Hearing aid battery rechargeable rechargeable  Receiver 3-85 3-85  Dome/ custom earpiece Open dome 8mm Open dome 8mm Also has a Mold -hard minifit with canal lock-SN:[id number]   Retention wire none none  Warranty expiration date 11-02-2024 11-02-2024  Loss and Damage      Additional accessories Expiration date Remote control [id number] Smart Charger [id number]    Initial fitting date 10-15-2021 10-15-2021  Device was fit at: [facility]  Hearing aid services Bundled until: 11-02-2024   Bundled until:11-02-2024          Chief complaint: Patient reports that the hearing aids are not working. Also reports one of them blinks orange when in the charger (left).  Actions taken: Inspection of the devices and listening check showed that they were working well - amplifying well. Patient acknowledged it. I saw that the left aid blinks orange, but patient says that after some time in the charger will blink green.  I cleaned the charger, which could have an effect  in how well the aid was charging. After cleaning it today, if she continues to see that there is something off with the light pattern or the aid turns off during the day, she may stop by the office to drop it off for me to send it to [facility] for repair (it may need a new battery).  Also, both hearing aids were reconnected to her phone.  Services fee: $0 was paid at checkout.   Recommend: Return for a hearing aid check , as needed. Return for a hearing evaluation and to see an ENT, if concerns with hearing changes arise.    Jessica Russell,  AUD

## 2023-07-01 ENCOUNTER — Other Ambulatory Visit: Payer: Self-pay

## 2023-07-01 ENCOUNTER — Encounter (HOSPITAL_COMMUNITY): Payer: Self-pay | Admitting: Emergency Medicine

## 2023-07-01 ENCOUNTER — Emergency Department (HOSPITAL_COMMUNITY): Payer: Medicare HMO

## 2023-07-01 ENCOUNTER — Emergency Department (HOSPITAL_COMMUNITY)
Admission: EM | Admit: 2023-07-01 | Discharge: 2023-07-01 | Disposition: A | Discharge status: Home / Self Care | Payer: Medicare HMO | Attending: Emergency Medicine | Admitting: Emergency Medicine

## 2023-07-01 DIAGNOSIS — S63283A Dislocation of proximal interphalangeal joint of left middle finger, initial encounter: Secondary | ICD-10-CM | POA: Insufficient documentation

## 2023-07-01 DIAGNOSIS — S63259A Unspecified dislocation of unspecified finger, initial encounter: Secondary | ICD-10-CM

## 2023-07-01 DIAGNOSIS — M7989 Other specified soft tissue disorders: Secondary | ICD-10-CM | POA: Insufficient documentation

## 2023-07-01 DIAGNOSIS — W010XXA Fall on same level from slipping, tripping and stumbling without subsequent striking against object, initial encounter: Secondary | ICD-10-CM | POA: Diagnosis not present

## 2023-07-01 DIAGNOSIS — S6992XA Unspecified injury of left wrist, hand and finger(s), initial encounter: Secondary | ICD-10-CM | POA: Diagnosis present

## 2023-07-01 MED ORDER — LIDOCAINE HCL (PF) 1 % IJ SOLN
10.0000 mL | Freq: Once | INTRAMUSCULAR | Status: AC
  Administered 2023-07-01: 10 mL
  Filled 2023-07-01: qty 10

## 2023-07-01 MED ORDER — GABAPENTIN 400 MG PO CAPS
800.0000 mg | ORAL_CAPSULE | Freq: Once | ORAL | Status: AC
  Administered 2023-07-01: 800 mg via ORAL
  Filled 2023-07-01: qty 2

## 2023-07-01 NOTE — ED Triage Notes (Signed)
Pt reports she fell when turning around and got her feet tripped up and fell. Pt reports she hit her knee and middle finger on the left hand is visibly pointing from the middle knuckle to the left. Pt reports 8/10 pain. Pt reports neuropathy in both hands and believes that is saving her some of the pain.

## 2023-07-01 NOTE — Discharge Instructions (Signed)
Wear the finger splint until you see the orthopedic surgeon. Call their office for an appointment in about 1 week.  If you develop new or worsening pain, swelling, numbness, or any other new/concerning symptoms, then return to the ER or call 911.

## 2023-07-01 NOTE — ED Provider Notes (Signed)
[facility] EMERGENCY DEPARTMENT AT [facility] Provider Note   CSN: [id number] Arrival date & time: 07/01/23  1032     History  Chief Complaint  Patient presents with   Finger Injury    Jessica Russell is a 89 y.o. female.  HPI 89 year old female presents after a trip and fall and a left middle finger injury.  She states her feet got caught up and caused her to fall.  States she thinks he might of hit her head but it was pretty minimal and she has no headache.  She is not on blood thinners.  Her finger hurts mostly from her chronic neuropathy but there is also pain at the site of the injury and deformity.  No laceration/wound.  No new numbness.  Home Medications Prior to Admission medications   Medication Sig Start Date End Date Taking? Authorizing Provider  acetaminophen (TYLENOL) 500 MG tablet Take 1,500 mg by mouth at bedtime.    [provider]  carboxymethylcellul-glycerin (OPTIVE) 0.5-0.9 % ophthalmic solution Apply to eye.    [provider]  cholecalciferol (VITAMIN D3) 25 MCG (1000 UNIT) tablet Take 1,000 Units by mouth daily.    [provider]  fluocinonide gel (LIDEX) 0.05 % Apply 1 application topically as needed (mouth sores).  03/14/18   [provider]  folic acid (FOLVITE) 1 MG tablet Take 1 mg by mouth daily.    [provider]  gabapentin (NEURONTIN) 400 MG capsule TAKE 2 CAPSULES (800 MG TOTAL) BY MOUTH 4 (FOUR) TIMES DAILY. 07/24/22   Billie Lade, MD  Menaquinone-7 (VITAMIN K2 PO) Take by mouth daily.    [provider]  metFORMIN (GLUCOPHAGE) 500 MG tablet TAKE 1 TABLET BY MOUTH EVERY DAY 05/11/23   Billie Lade, MD  methotrexate (RHEUMATREX) 2.5 MG tablet Take 15 mg by mouth once a week. Caution:Chemotherapy. Protect from light.    [provider]  Multiple Vitamins-Minerals (CENTRUM SILVER PO) Take by mouth daily.    [provider]  MYRBETRIQ 50 MG TB24 tablet Take 1  tablet (50 mg total) by mouth daily. 06/18/22 06/03/23  Billie Lade, MD  Omega-3 Fatty Acids (RA FISH OIL) 1000 MG CAPS Take 2 capsules by mouth daily.    [provider]  omeprazole (PRILOSEC) 20 MG capsule Take 1 capsule (20 mg total) by mouth daily. 06/22/23   Dolores Frame, MD  Polyvinyl Alcohol-Povidone (REFRESH OP) Apply to eye 2 (two) times daily as needed.    [provider]  Turmeric 1053 MG TABS Take 600 mg by mouth. With black pepper extract    [provider]      Allergies    Phenergan [promethazine hcl], Ciprofloxacin, and Levofloxacin    Review of Systems   Review of Systems  Musculoskeletal:  Positive for arthralgias.  Neurological:  Negative for headaches.    Physical Exam Updated Vital Signs BP 136/88 (BP Location: Right Arm)   Pulse 70   Temp 98.2 F (36.8 C) (Oral)   Resp 18   Ht 5\' 1"  (1.549 m)   Wt 77 kg   SpO2 99%   BMI 32.06 kg/m  Physical Exam Vitals and nursing note reviewed.  Constitutional:      Appearance: She is well-developed.  HENT:     Head: Normocephalic and atraumatic.  Cardiovascular:     Rate and Rhythm: Normal rate and regular rhythm.     Pulses:  Radial pulses are 2+ on the left side.  Pulmonary:     Effort: Pulmonary effort is normal.  Musculoskeletal:     Left hand: Deformity and tenderness present.     Comments: Left middle finger with a closed PIP dislocation. No laceration.  Skin:    General: Skin is warm and dry.  Neurological:     Mental Status: She is alert.     ED Results / Procedures / Treatments   Labs (all labs ordered are listed, but only abnormal results are displayed) Labs Reviewed - No data to display  EKG None  Radiology DG Finger Middle Left Result Date: 07/01/2023 CLINICAL DATA:  Post reduction of the middle finger. EXAM: LEFT MIDDLE FINGER 2+V COMPARISON:  Earlier radiograph dated 07/01/2023. FINDINGS: Interval reduction of the dislocated PIP joint of  the middle finger, now in anatomic alignment. No acute fracture or dislocation. The bones are osteopenic. The soft tissues are unremarkable. IMPRESSION: Interval reduction of the dislocated PIP joint of the middle finger, now in anatomic alignment. Electronically Signed   By: Elgie Collard M.D.   On: 07/01/2023 12:38   DG Hand Complete Left Result Date: 07/01/2023 CLINICAL DATA:  Broken extremity.  Fall. EXAM: LEFT HAND - COMPLETE 3+ VIEW COMPARISON:  Left thumb radiographs 11/10/2022 FINDINGS: There is diffuse decreased bone mineralization. There is complete medial/ulnar and dorsal dislocation of the middle phalanx of the third finger with respect of the proximal phalanx. There is medial angulation of the middle and distal phalanges of the third finger with respect to the proximal phalanx. Mild-to-moderate thumb carpometacarpal joint space narrowing, subchondral sclerosis, and peripheral osteophytosis. No acute fracture is seen. Old healed fracture deformity of the distal radius is similar to prior 11/10/2022 left thumb radiographs. IMPRESSION: 1. Complete medial/ulnar and dorsal dislocation of the middle phalanx of the third finger with respect of the proximal phalanx. 2. Mild-to-moderate thumb carpometacarpal osteoarthritis. Electronically Signed   By: Neita Garnet M.D.   On: 07/01/2023 11:28    Procedures .Reduction of dislocation  Date/Time: 07/01/2023 12:08 PM  Performed by: Pricilla Loveless, MD Authorized by: Pricilla Loveless, MD  Consent: Verbal consent obtained. Risks and benefits: risks, benefits and alternatives were discussed Patient identity confirmed: verbally with patient Preparation: Patient was prepped and draped in the usual sterile fashion. Local anesthesia used: yes Anesthesia: digital block  Anesthesia: Local anesthesia used: yes Local Anesthetic: lidocaine 1% without epinephrine  Sedation: Patient sedated: no  Patient tolerance: patient tolerated the procedure well  with no immediate complications Comments: Closed reduction performed with no significant difficulty.        Medications Ordered in ED Medications  lidocaine (PF) (XYLOCAINE) 1 % injection 10 mL (10 mLs Other Given 07/01/23 1159)  gabapentin (NEURONTIN) capsule 800 mg (800 mg Oral Given 07/01/23 1158)    ED Course/ Medical Decision Making/ A&P                                 Medical Decision Making Amount and/or Complexity of Data Reviewed Radiology: ordered and independent interpretation performed.    Details: Finger dislocation, normal after reduction  Risk Prescription drug management.   Patient has no new neuro vascular complaints after the close dislocation of her finger.  This was reduced as above.  No obvious fracture.  Was placed in a finger splint and will have her follow-up with hand surgery.  She reports what sounds like a very minimal head  trauma and declines head CT and it seems this would be unlikely to be a significant head injury.  She is not on blood thinners.  Will discharge home with return precautions.  She was given a dose of her home gabapentin as she had not yet taken it this morning.        Final Clinical Impression(s) / ED Diagnoses Final diagnoses:  Dislocation of finger, initial encounter    Rx / DC Orders ED Discharge Orders     None         Pricilla Loveless, MD 07/01/23 1304

## 2023-07-07 ENCOUNTER — Encounter: Admitting: Orthopaedic Surgery

## 2023-07-08 ENCOUNTER — Encounter: Payer: Self-pay | Admitting: Orthopaedic Surgery

## 2023-07-08 ENCOUNTER — Ambulatory Visit: Payer: Medicare HMO | Admitting: Orthopaedic Surgery

## 2023-07-08 VITALS — BP 143/86 | HR 69 | Ht 61.0 in | Wt 169.0 lb

## 2023-07-08 DIAGNOSIS — S63253A Unspecified dislocation of left middle finger, initial encounter: Secondary | ICD-10-CM | POA: Diagnosis not present

## 2023-07-08 DIAGNOSIS — S63259A Unspecified dislocation of unspecified finger, initial encounter: Secondary | ICD-10-CM

## 2023-07-08 NOTE — Patient Instructions (Signed)
 Physical therapy has been ordered for you at [facility]. They should call you to schedule, [phone] is the phone number to call, if you want to call to schedule.

## 2023-07-08 NOTE — Progress Notes (Signed)
 I fell and dislocated my finger.  She fell at home on 07-01-23 and dislocated the PIP joint of the left nondominant long finger.  She was seen in the ER.  Reduction was done there.  She had no fracture or other injury.    I have reviewed the notes and X-rays.  I have independently reviewed and interpreted x-rays of this patient done at another site by another physician or qualified health professional.  She is in a splint for the left long finger.  NV intact.  She has some swelling and contusion.  Finger is stable.  Encounter Diagnosis  Name Primary?   Closed dislocation of finger of left hand, initial encounter Yes   Continue splint for the finger.  Return in one week.  I will set up OT for the hand.  She may remove splint to bathe.  Call if any problem.  Precautions discussed.  Electronically Signed Darreld Mclean, MD 2/27/20258:13 AM

## 2023-07-14 ENCOUNTER — Ambulatory Visit: Admitting: Orthopaedic Surgery

## 2023-07-14 ENCOUNTER — Encounter: Payer: Self-pay | Admitting: Orthopaedic Surgery

## 2023-07-14 VITALS — BP 113/71 | HR 80

## 2023-07-14 DIAGNOSIS — S63259D Unspecified dislocation of unspecified finger, subsequent encounter: Secondary | ICD-10-CM

## 2023-07-14 NOTE — Progress Notes (Signed)
 My finger is stiff  She has been using the splint for the left long finger.  She has some bruising still.  NV intact.  ROM is decreased.  Encounter Diagnosis  Name Primary?   Closed dislocation of finger of left hand, subsequent encounter Yes   She has OT set up next week.  Keep that appointment and they may modify splint.  Return in five weeks.  Call if any problem.  Precautions discussed.  Electronically Signed Darreld Mclean, MD 3/5/20252:24 PM

## 2023-07-15 ENCOUNTER — Ambulatory Visit: Payer: Medicare HMO | Admitting: Orthopaedic Surgery

## 2023-07-22 ENCOUNTER — Ambulatory Visit (HOSPITAL_COMMUNITY): Payer: Medicare HMO | Admitting: Occupational Therapy

## 2023-07-22 ENCOUNTER — Encounter (HOSPITAL_COMMUNITY): Payer: Medicare HMO | Admitting: Occupational Therapy

## 2023-07-28 ENCOUNTER — Telehealth (INDEPENDENT_AMBULATORY_CARE_PROVIDER_SITE_OTHER): Payer: Self-pay | Admitting: Otolaryngology

## 2023-07-28 ENCOUNTER — Encounter (INDEPENDENT_AMBULATORY_CARE_PROVIDER_SITE_OTHER): Payer: Self-pay

## 2023-07-28 NOTE — Telephone Encounter (Signed)
 Called patient to reschedule 09/02/23 appt w/ Dr. Suszanne Conners, no answer nor voicemail available. Sent patient message in MyChart.

## 2023-07-29 ENCOUNTER — Encounter (HOSPITAL_COMMUNITY): Admitting: Occupational Therapy

## 2023-08-02 ENCOUNTER — Encounter (HOSPITAL_COMMUNITY): Payer: Medicare HMO | Admitting: Occupational Therapy

## 2023-08-04 ENCOUNTER — Encounter: Payer: Self-pay | Admitting: Orthopaedic Surgery

## 2023-08-04 ENCOUNTER — Encounter: Admitting: Orthopaedic Surgery

## 2023-08-04 ENCOUNTER — Other Ambulatory Visit (INDEPENDENT_AMBULATORY_CARE_PROVIDER_SITE_OTHER)

## 2023-08-04 ENCOUNTER — Ambulatory Visit (INDEPENDENT_AMBULATORY_CARE_PROVIDER_SITE_OTHER): Admitting: Orthopaedic Surgery

## 2023-08-04 DIAGNOSIS — M542 Cervicalgia: Secondary | ICD-10-CM

## 2023-08-04 NOTE — Progress Notes (Signed)
 My arm goes numb, my fingers are sore  She has had pain running from her neck to the left elbow to the left hand and long and index fingers.  She had a fall in the recent past.  She fell on outstretched left hand.  She has had pain in the long finger since then.  It goes numb at times.  She has pain to the left shoulder and upper left back.  She has problems eating at times with the left side of her jaw hurting.  She saw the dentist and had X-rays and was told her jaw was fine.  She has problems sleeping and gapping things with the left hand.  Nothing seems to help.  She has full ROM of the shoulders and elbows.  The left hand has some numbness of the dorsum of the left long and ring fingers and grip is slightly less on the left but she is right hand dominant.  ROM of the neck is painful to the left and limited.  She has pain of the left upper trapezius.  She has good reflexes but left triceps is weaker than right to resisted extension of elbow.    X-rays were done of the cervical spine, reported separately.  Encounter Diagnosis  Name Primary?   Cervical pain Yes   I will get MRI of the neck as I am concerned about HNP affecting the C5 and C6 dermatones.  Return in three weeks after MRI.  Call if any problem.  Precautions discussed.  Electronically Signed Darreld Mclean, MD 3/26/202510:52 AM

## 2023-08-05 ENCOUNTER — Telehealth: Payer: Self-pay | Admitting: Orthopaedic Surgery

## 2023-08-05 NOTE — Telephone Encounter (Signed)
 DR, Hilda Lias   Patient is in a lot of pain and she hs called her insurance company and they told her nothing has been sent it yet.    Please call her back at [phone]  On a scaled of 1 to 10 her pain level its a 8

## 2023-08-09 ENCOUNTER — Ambulatory Visit (HOSPITAL_COMMUNITY)

## 2023-08-10 ENCOUNTER — Ambulatory Visit (HOSPITAL_COMMUNITY)
Admission: RE | Admit: 2023-08-10 | Discharge: 2023-08-10 | Disposition: A | Discharge status: Home / Self Care | Source: Ambulatory Visit | Attending: Orthopaedic Surgery | Admitting: Orthopaedic Surgery

## 2023-08-10 DIAGNOSIS — M542 Cervicalgia: Secondary | ICD-10-CM | POA: Diagnosis present

## 2023-08-18 ENCOUNTER — Ambulatory Visit: Admitting: Orthopaedic Surgery

## 2023-08-25 ENCOUNTER — Ambulatory Visit: Admitting: Orthopaedic Surgery

## 2023-08-25 ENCOUNTER — Encounter: Payer: Self-pay | Admitting: Orthopaedic Surgery

## 2023-08-25 VITALS — BP 127/76 | HR 66

## 2023-08-25 DIAGNOSIS — M542 Cervicalgia: Secondary | ICD-10-CM

## 2023-08-25 NOTE — Progress Notes (Signed)
 My neck still hurts.  She had MRI of the neck showing: IMPRESSION: 1. C3-4: Endplate osteophytes and bulging of the disc. Facet osteoarthritis and ligamentous hypertrophy. Foraminal narrowing on the left that could possibly affect the left C4 nerve. Some edematous change associated with the facet joints which could contribute to regional pain. 2. C5-6: Facet osteoarthritis on the left which could be painful. No canal or foraminal stenosis. 3. C6-7: Facet osteoarthritis on the left which could be painful. Foraminal narrowing on the left could affect the C7 nerve.  I have explained the findings to her.  I will have her see neurosurgery about possible injection.  She has good ROM of the neck today and pain radiating down to the left long finger.  NV intact.  Encounter Diagnosis  Name Primary?   Cervical pain Yes   To see neurosurgery.  Call if any problem.  Precautions discussed.  Electronically Signed Pleasant Brilliant, MD 4/16/202511:39 AM

## 2023-08-25 NOTE — Addendum Note (Signed)
 Addended by: [place] Snow T on: 08/25/2023 01:17 PM   Modules accepted: Orders

## 2023-08-25 NOTE — Patient Instructions (Addendum)
 To see neurosurgeon  You have been referred to [facility] on [facility] in [place], they will call you with appointment. If you have not heard anything in one week call them to schedule [phone]

## 2023-09-02 ENCOUNTER — Ambulatory Visit (INDEPENDENT_AMBULATORY_CARE_PROVIDER_SITE_OTHER): Payer: Medicare HMO

## 2023-09-07 ENCOUNTER — Other Ambulatory Visit: Payer: Self-pay

## 2023-09-07 ENCOUNTER — Ambulatory Visit (HOSPITAL_COMMUNITY): Attending: Orthopaedic Surgery | Admitting: Occupational Therapy

## 2023-09-07 ENCOUNTER — Encounter (HOSPITAL_COMMUNITY): Payer: Self-pay | Admitting: Occupational Therapy

## 2023-09-07 DIAGNOSIS — R29898 Other symptoms and signs involving the musculoskeletal system: Secondary | ICD-10-CM | POA: Insufficient documentation

## 2023-09-07 DIAGNOSIS — M79645 Pain in left finger(s): Secondary | ICD-10-CM | POA: Insufficient documentation

## 2023-09-07 DIAGNOSIS — S63259A Unspecified dislocation of unspecified finger, initial encounter: Secondary | ICD-10-CM | POA: Insufficient documentation

## 2023-09-07 NOTE — Therapy (Signed)
 OUTPATIENT OCCUPATIONAL THERAPY ORTHO EVALUATION  Patient Name: Jessica Russell MRN: [id number] DOB:Mar 05, 1928, 89 y.o., female Today's Date: 09/07/2023   END OF SESSION:  OT End of Session - 09/07/23 1421     Visit Number 1    Number of Visits 4    Date for OT Re-Evaluation 10/07/23    Authorization Type [employer]    Authorization Time Period no auth required    Progress Note Due on Visit 10    OT Start Time 1349    OT Stop Time 1420    OT Time Calculation (min) 31 min    Activity Tolerance Patient tolerated treatment well    Behavior During Therapy WFL for tasks assessed/performed             Past Medical History:  Diagnosis Date   Diabetic nephropathy (HCC)    Diverticulitis    Foot ulcer, right, with fat layer exposed (HCC)    non-healing   History of bilateral breast cancer 04-14-2018 per pt no recurrence   dx 2000 w/ right breast cancer, s/p  lumpectomy with node dissection completed radiation/  dx 2002 w/ left breast cancer, s/p lumpectomy with node dissection,  completed radiation   History of diverticulitis of colon 2009   s/p  partial colectomy for perforated diverticulitis   History of external beam radiation therapy    2000 for right breast cancer;   2002  for left breast cancer  (per pt 36 treatment for each breast)   HLD (hyperlipidemia) 03/24/2017   Neuropathy    OA (osteoarthritis)    Osteoporosis 04/14/2017   Peripheral arterial occlusive disease (HCC)    04-13-2018   s/p  balloon angioplasty to the anterior tibial artery for severe stenosis right lower extremity (dr Alvenia Aus)   Personal history of radiation therapy    both breasts   Rheumatoid arthritis (HCC) 03/13/2019   Type 2 diabetes mellitus (HCC)    Vestibular schwannoma (HCC)     S/p Gamma Knife stereotactic radiosurgery April 2022   Vitamin D  deficiency disease 03/13/2019   Past Surgical History:  Procedure Laterality Date   ABDOMINAL AORTOGRAM W/LOWER EXTREMITY N/A 04/13/2018    Procedure: ABDOMINAL AORTOGRAM W/LOWER EXTREMITY;  Surgeon: Wenona Hamilton, MD;  [facility]: [facility];  Service: Cardiovascular;  Laterality: N/A;   APPENDECTOMY  age 23   BREAST LUMPECTOMY Right 2000   BREAST LUMPECTOMY Left 2002   BREAST LUMPECTOMY WITH AXILLARY LYMPH NODE DISSECTION Bilateral right 2000;   left 2002   BUNIONECTOMY Bilateral 1968   CARPAL TUNNEL RELEASE Right 2017   CATARACT EXTRACTION W/PHACO Right 08/30/2017   Procedure: CATARACT EXTRACTION PHACO AND INTRAOCULAR LENS PLACEMENT RIGHT EYE;  Surgeon: Anner Kill, MD;  Location: [facility];  Service: Ophthalmology;  Laterality: Right;  CDE: 13.63   CATARACT EXTRACTION W/PHACO Left 09/20/2017   Procedure: CATARACT EXTRACTION PHACO AND INTRAOCULAR LENS PLACEMENT LEFT EYE;  Surgeon: Anner Kill, MD;  Location: [facility];  Service: Ophthalmology;  Laterality: Left;  CDE: 11.25   D & C HYSTEROSCOPY W/ RESECTION POLYP  2017   per pt benign   ESOPHAGOGASTRODUODENOSCOPY (EGD) WITH PROPOFOL   06/22/2023   Procedure: ESOPHAGOGASTRODUODENOSCOPY (EGD) WITH PROPOFOL ;  Surgeon: Urban Garden, MD;  Location: [facility];  Service: Gastroenterology;;   FOOT SURGERY     removal of two bones   FRACTURE SURGERY Left 05/12/2016   patella fracture,   per pt no retatined hardware   GRAFT APPLICATION N/A 04/18/2018   Procedure: APPLICATION  OF STRAVIX GRAFT;  Surgeon: Radene Buffalo, DPM;  Location: [facility];  Service: Podiatry;  Laterality: N/A;   HEMICOLECTOMY  2009   diverticulitis w/ perforation   METATARSAL HEAD EXCISION N/A 04/18/2018   Procedure: FIRST METATARSAL HEAD RECESSION, WOUND DEBRIEDMENT;  Surgeon: Radene Buffalo, DPM;  Location: [facility];  Service: Podiatry;  Laterality: N/A;   NASAL SEPTOPLASTY W/ TURBINOPLASTY Bilateral 01/04/2018   Procedure: NASAL SEPTOPLASTY WITH BILATERAL TURBINATE REDUCTION;  Surgeon: Reynold Caves, MD;  Location: [facility];  Service: ENT;   Laterality: Bilateral;   PERIPHERAL VASCULAR BALLOON ANGIOPLASTY  04/13/2018   Procedure: PERIPHERAL VASCULAR BALLOON ANGIOPLASTY;  Surgeon: Wenona Hamilton, MD;  [facility]: [facility];  Service: Cardiovascular;;  Anterior Tibial   SKIN GRAFT     left foot    VENTRAL HERNIA REPAIR  01-07-2016    [facility] [place], [place]   w/ mesh   Patient Active Problem List   Diagnosis Date Noted   Dysphagia 06/03/2023   Dizziness 03/08/2023   Sensorineural hearing loss, unilateral, right ear, with unrestricted hearing on the contralateral side 03/08/2023   Chronic rhinitis 03/08/2023   Hypertrophy of nasal turbinates 03/08/2023   Postnasal drip 03/08/2023   Impacted cerumen of both ears 03/08/2023   Leg swelling 12/17/2022   PAD (peripheral artery disease) (HCC) 12/17/2022   Chronic cough 06/10/2022   Throat irritation 05/07/2022   URI (upper respiratory infection) 04/07/2022   Diverticulitis 07/16/2021   Encounter for examination following treatment at hospital 07/16/2021   Breast pain, left 04/29/2021   Recurrent falls 03/21/2021   Status post gamma knife treatment 02/13/2021   Constipation 08/28/2020   Vestibular schwannoma (HCC) 07/05/2020   Voiding dysfunction 05/15/2020   Urge incontinence of urine 02/28/2020   Incomplete emptying of bladder 02/28/2020   Neuropathy 02/28/2020   Vitamin D  deficiency disease 03/13/2019   Rheumatoid arthritis (HCC) 03/13/2019   Positive fecal occult blood test 12/12/2018   Hemorrhoids 12/12/2018   Rectal bleeding 12/12/2018   Uterovaginal prolapse, incomplete 07/04/2018   Pessary maintenance 07/04/2018   Abscess of right foot    Type 2 diabetes mellitus with neurological complications (HCC) 05/03/2017   Diabetic foot ulcer (HCC) 05/03/2017   Chronic osteoarthritis 04/14/2017   Osteopenia 04/14/2017   Diabetic nephropathy (HCC) 03/24/2017   Diabetic neuropathy (HCC) 03/24/2017   Cataracts, bilateral 03/24/2017   History of bilateral breast cancer  03/24/2017   Recurrent UTI 03/24/2017   HLD (hyperlipidemia) 03/24/2017   Diverticulosis 03/24/2017   PCP: Dr. Artemisa Bile REFERRING PROVIDER: Dr. Pleasant Brilliant  ONSET DATE: 07/01/23  REFERRING DIAG: W09.811B (ICD-10-CM) - Closed dislocation of finger of left hand, initial encounter   THERAPY DIAG:  Pain in left finger(s)  Other symptoms and signs involving the musculoskeletal system  Rationale for Evaluation and Treatment: Rehabilitation  SUBJECTIVE:   SUBJECTIVE STATEMENT: S: "It gets to hurting sometimes."  Pt accompanied by: self  PERTINENT HISTORY: Pt is a 89 y/o female s/p fall on 07/01/23 resulting in 3rd digit PIP joint dislocation with realignment in the ER. Pt initially using a finger splint, is no longer using. Pt presenting with pain and stiffness today.   PRECAUTIONS: None  WEIGHT BEARING RESTRICTIONS: No  PAIN:  Are you having pain? Yes: NPRS scale: 5/10 Pain location: left 3rd PIP Pain description: aching Aggravating factors: bending Relieving factors: gloves/finger tube  FALLS: Has patient fallen in last 6 months? Yes. Number of falls 1  PLOF: Independent  PATIENT GOALS: To  have less pain and more mobility in the right 3rd digit.   NEXT MD VISIT: None scheduled  OBJECTIVE:  Note: Objective measures were completed at Evaluation unless otherwise noted.  HAND DOMINANCE: Right  ADLs: Pt reports difficulty with making a fist, reports fixing food and cleaning the kitchen is difficulty due to limited ability to grasp and lift items.   FUNCTIONAL OUTCOME MEASURES: Quick Dash: 43.18  UPPER EXTREMITY ROM:      Active ROM Left eval  Thumb MCP (0-60)   Thumb IP (0-80)   Thumb Opposition to Small Finger    Index MCP (0-90)    Index PIP (0-100)    Index DIP (0-70)    Long MCP (0-90) 70   Long PIP (0-100)  82  Long DIP (0-70)  38  Ring MCP (0-90)    Ring PIP (0-100)    Ring DIP (0-70)    Little MCP (0-90)    Little PIP (0-100)    Little DIP  (0-70)    (Blank rows = not tested)   HAND FUNCTION: Grip strength: Right: 58 lbs; Left: 24 lbs, Lateral pinch: Right: 9 lbs, Left: 6 lbs, and 3 point pinch: Right: 8 lbs, Left: 7 lbs  SENSATION: Pt reports burning and numbness in bilateral hands at baseline secondary to neuropathy  EDEMA: 3rd digit PIP: right-6.5cm; left-7.25cm  COGNITION: Overall cognitive status: Within functional limits for tasks assessed   TREATMENT DATE:  Eval:  -Towel crumple: 5 reps -Composite flexion/extension: 5 reps -Opposition to 3rd digit: 5 reps                                                                                                                           -Finger taps: 5 reps -Joint Blocking: PIP and DIP-5 reps -Digit abduction/adduction: 5 reps   PATIENT EDUCATION: Education details: finger A/ROM Person educated: Patient Education method: Programmer, multimedia, Demonstration, and Handouts Education comprehension: verbalized understanding and returned demonstration  HOME EXERCISE PROGRAM: Eval: finger A/ROM  GOALS: Goals reviewed with patient? Yes  SHORT TERM GOALS: Target date: 10/07/23  Pt will be provided with and educated on HEP to improve mobility in left hand required for ADL task completion.   Goal status: INITIAL  2.  Pt will improve A/ROM of left 3rd digit by 10 degrees to improve ability to grasp and use a washcloth.   Goal status: INITIAL  3.  Pt will increase left grip strength by 10# or greater to improve ability to use LUE to lift and move items during meal preparation and cleanup.   Goal status: INITIAL  4.  Pt will decrease pain in left 3rd digit to 4/10 or less to improve ability to make a fist required for grasp.  Goal status: INITIAL  5.  Pt will be educated on AE available to use to improve success and decrease pain with ADLs when attempting to use the LUE.   Goal status: INITIAL    ASSESSMENT:  CLINICAL IMPRESSION:  Patient is a 89 y.o. female who was  seen today for occupational therapy evaluation for pain and stiffness of the left 3rd digit sustained during a fall in which the pt dislocated her PIP joint. Pt is no longer wearing a splint, reports continued pain during functional use. Pt with notable edema at the PIP joint, however also note possible thickening around the PIP joint. Pt with hx significant for OA and RA.  Pt provided with digit cap sleeve to trial for edema.   PERFORMANCE DEFICITS: in functional skills including ADLs, IADLs, coordination, dexterity, sensation, edema, ROM, strength, pain, fascial restrictions, and UE functional use  IMPAIRMENTS: are limiting patient from ADLs, IADLs, rest and sleep, and leisure.   COMORBIDITIES: may have co-morbidities  that affects occupational performance. Patient will benefit from skilled OT to address above impairments and improve overall function.  MODIFICATION OR ASSISTANCE TO COMPLETE EVALUATION: No modification of tasks or assist necessary to complete an evaluation.  OT OCCUPATIONAL PROFILE AND HISTORY: Problem focused assessment: Including review of records relating to presenting problem.  CLINICAL DECISION MAKING: LOW - limited treatment options, no task modification necessary  REHAB POTENTIAL: Good  EVALUATION COMPLEXITY: Low      PLAN:  OT FREQUENCY: 1x/week  OT DURATION: 4 weeks  PLANNED INTERVENTIONS: 97168 OT Re-evaluation, 97535 self care/ADL training, 82956 therapeutic exercise, 97530 therapeutic activity, 97112 neuromuscular re-education, 97140 manual therapy, 97035 ultrasound, 97014 electrical stimulation unattended, patient/family education, and DME and/or AE instructions  RECOMMENDED OTHER SERVICES: None at this time  CONSULTED AND AGREED WITH PLAN OF CARE: Patient  PLAN FOR NEXT SESSION: Follow up on digit cap wear and results and HEP, begin with A/ROM and grip strengthening   Lafonda Piety, OTR/L  [phone] 09/07/2023, 2:22 PM

## 2023-09-07 NOTE — Patient Instructions (Signed)
Complete each exercise 10-15X, 2-3X/day  1) Towel crunch Place a small towel on a firm table top. Flatten out the towel and then place your hand on one end of it.  Next, flex your fingers 2-5 (index finger through pinky finger) as you pull the towel towards your hand.    2) Digit composite flexion/adduction (make a fist) Hold your hand up as shown. Open and close your hand into a fist and repeat. If you cannot make a full fist, then make a partial fist.    3) Thumb/finger opposition Touch the tip of the thumb to each fingertip one by one. Extend fingers fully after they are touched.      4) Finger Taps Start with the hand flat and fingers slightly spread.  One at a time, starting with the thumb, lift each finger up separately.    5) PIP Joint Blocking Grasp the affected finger, bracing below the middle knuckle, and actively bend the finger as shown.    6) DIP Joint Blocking Grasp the affected finger, bracing below the last knuckle, and actively bend the finger at the last joint.     7) Digit Abduction/Adduction Hold hand palm down flat on table. Spread your fingers apart and back together.   

## 2023-09-16 ENCOUNTER — Encounter (HOSPITAL_COMMUNITY): Payer: Self-pay | Admitting: Occupational Therapy

## 2023-09-16 ENCOUNTER — Ambulatory Visit (HOSPITAL_COMMUNITY): Attending: Orthopaedic Surgery | Admitting: Occupational Therapy

## 2023-09-16 DIAGNOSIS — M79645 Pain in left finger(s): Secondary | ICD-10-CM | POA: Diagnosis present

## 2023-09-16 DIAGNOSIS — R29898 Other symptoms and signs involving the musculoskeletal system: Secondary | ICD-10-CM | POA: Diagnosis present

## 2023-09-16 NOTE — Therapy (Signed)
 OUTPATIENT OCCUPATIONAL THERAPY ORTHO TREATMENT  Patient Name: Jessica Russell MRN: [id number] DOB:1928-02-06, 89 y.o., female Today's Date: 09/16/2023   END OF SESSION:  OT End of Session - 09/16/23 1050     Visit Number 2    Number of Visits 4    Date for OT Re-Evaluation 10/07/23    Authorization Type [employer]    Authorization Time Period no auth required    Progress Note Due on Visit 10    OT Start Time 1016    OT Stop Time 1058    OT Time Calculation (min) 42 min    Activity Tolerance Patient tolerated treatment well    Behavior During Therapy WFL for tasks assessed/performed              Past Medical History:  Diagnosis Date   Diabetic nephropathy (HCC)    Diverticulitis    Foot ulcer, right, with fat layer exposed (HCC)    non-healing   History of bilateral breast cancer 04-14-2018 per pt no recurrence   dx 2000 w/ right breast cancer, s/p  lumpectomy with node dissection completed radiation/  dx 2002 w/ left breast cancer, s/p lumpectomy with node dissection,  completed radiation   History of diverticulitis of colon 2009   s/p  partial colectomy for perforated diverticulitis   History of external beam radiation therapy    2000 for right breast cancer;   2002  for left breast cancer  (per pt 36 treatment for each breast)   HLD (hyperlipidemia) 03/24/2017   Neuropathy    OA (osteoarthritis)    Osteoporosis 04/14/2017   Peripheral arterial occlusive disease (HCC)    04-13-2018   s/p  balloon angioplasty to the anterior tibial artery for severe stenosis right lower extremity (dr Alvenia Aus)   Personal history of radiation therapy    both breasts   Rheumatoid arthritis (HCC) 03/13/2019   Type 2 diabetes mellitus (HCC)    Vestibular schwannoma (HCC)     S/p Gamma Knife stereotactic radiosurgery April 2022   Vitamin D  deficiency disease 03/13/2019   Past Surgical History:  Procedure Laterality Date   ABDOMINAL AORTOGRAM W/LOWER EXTREMITY N/A 04/13/2018    Procedure: ABDOMINAL AORTOGRAM W/LOWER EXTREMITY;  Surgeon: Wenona Hamilton, MD;  [facility]: [facility];  Service: Cardiovascular;  Laterality: N/A;   APPENDECTOMY  age 39   BREAST LUMPECTOMY Right 2000   BREAST LUMPECTOMY Left 2002   BREAST LUMPECTOMY WITH AXILLARY LYMPH NODE DISSECTION Bilateral right 2000;   left 2002   BUNIONECTOMY Bilateral 1968   CARPAL TUNNEL RELEASE Right 2017   CATARACT EXTRACTION W/PHACO Right 08/30/2017   Procedure: CATARACT EXTRACTION PHACO AND INTRAOCULAR LENS PLACEMENT RIGHT EYE;  Surgeon: Anner Kill, MD;  Location: [facility];  Service: Ophthalmology;  Laterality: Right;  CDE: 13.63   CATARACT EXTRACTION W/PHACO Left 09/20/2017   Procedure: CATARACT EXTRACTION PHACO AND INTRAOCULAR LENS PLACEMENT LEFT EYE;  Surgeon: Anner Kill, MD;  Location: [facility];  Service: Ophthalmology;  Laterality: Left;  CDE: 11.25   D & C HYSTEROSCOPY W/ RESECTION POLYP  2017   per pt benign   ESOPHAGOGASTRODUODENOSCOPY (EGD) WITH PROPOFOL   06/22/2023   Procedure: ESOPHAGOGASTRODUODENOSCOPY (EGD) WITH PROPOFOL ;  Surgeon: Urban Garden, MD;  [facility];  Service: Gastroenterology;;   FOOT SURGERY     removal of two bones   FRACTURE SURGERY Left 05/12/2016   patella fracture,   per pt no retatined hardware   GRAFT APPLICATION N/A 04/18/2018   Procedure:  APPLICATION OF STRAVIX GRAFT;  Surgeon: Radene Buffalo, DPM;  Location: [facility];  Service: Podiatry;  Laterality: N/A;   HEMICOLECTOMY  2009   diverticulitis w/ perforation   METATARSAL HEAD EXCISION N/A 04/18/2018   Procedure: FIRST METATARSAL HEAD RECESSION, WOUND DEBRIEDMENT;  Surgeon: Radene Buffalo, DPM;  Location: [facility];  Service: Podiatry;  Laterality: N/A;   NASAL SEPTOPLASTY W/ TURBINOPLASTY Bilateral 01/04/2018   Procedure: NASAL SEPTOPLASTY WITH BILATERAL TURBINATE REDUCTION;  Surgeon: Reynold Caves, MD;  Location: [facility];  Service: ENT;   Laterality: Bilateral;   PERIPHERAL VASCULAR BALLOON ANGIOPLASTY  04/13/2018   Procedure: PERIPHERAL VASCULAR BALLOON ANGIOPLASTY;  Surgeon: Wenona Hamilton, MD;  [facility];  Service: Cardiovascular;;  Anterior Tibial   SKIN GRAFT     left foot    VENTRAL HERNIA REPAIR  01-07-2016    [facility] [place], [place]   w/ mesh   Patient Active Problem List   Diagnosis Date Noted   Dysphagia 06/03/2023   Dizziness 03/08/2023   Sensorineural hearing loss, unilateral, right ear, with unrestricted hearing on the contralateral side 03/08/2023   Chronic rhinitis 03/08/2023   Hypertrophy of nasal turbinates 03/08/2023   Postnasal drip 03/08/2023   Impacted cerumen of both ears 03/08/2023   Leg swelling 12/17/2022   PAD (peripheral artery disease) (HCC) 12/17/2022   Chronic cough 06/10/2022   Throat irritation 05/07/2022   URI (upper respiratory infection) 04/07/2022   Diverticulitis 07/16/2021   Encounter for examination following treatment at hospital 07/16/2021   Breast pain, left 04/29/2021   Recurrent falls 03/21/2021   Status post gamma knife treatment 02/13/2021   Constipation 08/28/2020   Vestibular schwannoma (HCC) 07/05/2020   Voiding dysfunction 05/15/2020   Urge incontinence of urine 02/28/2020   Incomplete emptying of bladder 02/28/2020   Neuropathy 02/28/2020   Vitamin D  deficiency disease 03/13/2019   Rheumatoid arthritis (HCC) 03/13/2019   Positive fecal occult blood test 12/12/2018   Hemorrhoids 12/12/2018   Rectal bleeding 12/12/2018   Uterovaginal prolapse, incomplete 07/04/2018   Pessary maintenance 07/04/2018   Abscess of right foot    Type 2 diabetes mellitus with neurological complications (HCC) 05/03/2017   Diabetic foot ulcer (HCC) 05/03/2017   Chronic osteoarthritis 04/14/2017   Osteopenia 04/14/2017   Diabetic nephropathy (HCC) 03/24/2017   Diabetic neuropathy (HCC) 03/24/2017   Cataracts, bilateral 03/24/2017   History of bilateral breast cancer  03/24/2017   Recurrent UTI 03/24/2017   HLD (hyperlipidemia) 03/24/2017   Diverticulosis 03/24/2017   PCP: Dr. Artemisa Bile REFERRING PROVIDER: Dr. Pleasant Brilliant  ONSET DATE: 07/01/23  REFERRING DIAG: G29.528U (ICD-10-CM) - Closed dislocation of finger of left hand, initial encounter   THERAPY DIAG:  Pain in left finger(s)  Other symptoms and signs involving the musculoskeletal system  Rationale for Evaluation and Treatment: Rehabilitation  SUBJECTIVE:   SUBJECTIVE STATEMENT: S: "I can make a fist now."   PERTINENT HISTORY: Pt is a 89 y/o female s/p fall on 07/01/23 resulting in 3rd digit PIP joint dislocation with realignment in the ER. Pt initially using a finger splint, is no longer using. Pt presenting with pain and stiffness today.   PRECAUTIONS: None  WEIGHT BEARING RESTRICTIONS: No  PAIN:  Are you having pain? No  FALLS: Has patient fallen in last 6 months? Yes. Number of falls 1  PLOF: Independent  PATIENT GOALS: To have less pain and more mobility in the right 3rd digit.   NEXT MD VISIT: None scheduled  OBJECTIVE:  Note: Objective measures were completed at Evaluation unless otherwise noted.  HAND DOMINANCE: Right  ADLs: Pt reports difficulty with making a fist, reports fixing food and cleaning the kitchen is difficulty due to limited ability to grasp and lift items.   FUNCTIONAL OUTCOME MEASURES: Quick Dash: 43.18  UPPER EXTREMITY ROM:      Active ROM Left eval Left 09/16/23  Thumb MCP (0-60)    Thumb IP (0-80)    Thumb Opposition to Small Finger     Index MCP (0-90)     Index PIP (0-100)     Index DIP (0-70)     Long MCP (0-90) 70  72  Long PIP (0-100)  82 82  Long DIP (0-70)  38 70  Ring MCP (0-90)     Ring PIP (0-100)     Ring DIP (0-70)     Little MCP (0-90)     Little PIP (0-100)     Little DIP (0-70)     (Blank rows = not tested)   HAND FUNCTION: Grip strength: Right: 58 lbs; Left: 24 lbs, Lateral pinch: Right: 9 lbs, Left: 6 lbs,  and 3 point pinch: Right: 8 lbs, Left: 7 lbs  SENSATION: Pt reports burning and numbness in bilateral hands at baseline secondary to neuropathy  EDEMA: 3rd digit PIP: right-6.5cm; left-7.25cm  COGNITION: Overall cognitive status: Within functional limits for tasks assessed   TREATMENT DATE:  09/16/23 -Manual techniques-retrograde massage to left 3rd digit to decrease edema and improve joint mobility -Sponges: 23, 23 -Composite digit flexion/extension, 10 reps -Hand gripper: large and medium beads at 25# resistance, vertical  -Grooved pegboard: pt holding 5 pegs in palm and using tip pinch to pick up a peg from the bowl. Pt unable to translate to fingertips due to numbness in her hands and fingers. Completed 7 pegs while maintaining grasp on 5 pegs in palm. -Theraputty: red-flatten, using pvc pipe to cut circles into putty, roll, grip  Eval:  -Towel crumple: 5 reps -Composite flexion/extension: 5 reps -Opposition to 3rd digit: 5 reps                                                                                                                           -Finger taps: 5 reps -Joint Blocking: PIP and DIP-5 reps -Digit abduction/adduction: 5 reps   PATIENT EDUCATION: Education details: reviewed HEP Person educated: Patient Education method: Explanation, Demonstration, and Handouts Education comprehension: verbalized understanding and returned demonstration  HOME EXERCISE PROGRAM: Eval: finger A/ROM  GOALS: Goals reviewed with patient? Yes  SHORT TERM GOALS: Target date: 10/07/23  Pt will be provided with and educated on HEP to improve mobility in left hand required for ADL task completion.   Goal status: IN PROGRESS  2.  Pt will improve A/ROM of left 3rd digit by 10 degrees to improve ability to grasp and use a washcloth.   Goal status: IN PROGRESS  3.  Pt will increase left grip strength by 10#  or greater to improve ability to use LUE to lift and move items during meal  preparation and cleanup.   Goal status: IN PROGRESS  4.  Pt will decrease pain in left 3rd digit to 4/10 or less to improve ability to make a fist required for grasp.  Goal status: IN PROGRESS  5.  Pt will be educated on AE available to use to improve success and decrease pain with ADLs when attempting to use the LUE.   Goal status: IN PROGRESS    ASSESSMENT:  CLINICAL IMPRESSION: Pt reports HEP is going well and she feels her finger has been more compliant during daily tasks this week. Pt is able to make a full fist. Pt completing A/ROM, grip strengthening, and grasp development work today. Pt somewhat limited due to baseline sensation deficits in her hand. Pt with good activity tolerance, verbal cuing for form and technique. Pt with improving ROM in the left 3rd digit.    PERFORMANCE DEFICITS: in functional skills including ADLs, IADLs, coordination, dexterity, sensation, edema, ROM, strength, pain, fascial restrictions, and UE functional use      PLAN:  OT FREQUENCY: 1x/week  OT DURATION: 4 weeks  PLANNED INTERVENTIONS: 97168 OT Re-evaluation, 97535 self care/ADL training, 29528 therapeutic exercise, 97530 therapeutic activity, 97112 neuromuscular re-education, 97140 manual therapy, 97035 ultrasound, 97014 electrical stimulation unattended, patient/family education, and DME and/or AE instructions  CONSULTED AND AGREED WITH PLAN OF CARE: Patient  PLAN FOR NEXT SESSION: Update HEP for grip strengthening, A/ROM and grip strengthening   Lafonda Piety, OTR/L  [phone] 09/16/2023, 10:58 AM

## 2023-09-20 ENCOUNTER — Encounter (HOSPITAL_COMMUNITY): Admitting: Occupational Therapy

## 2023-09-22 ENCOUNTER — Telehealth: Payer: Self-pay | Admitting: Orthopaedic Surgery

## 2023-09-22 NOTE — Telephone Encounter (Signed)
 Dr. Vicente Graham pt Jessica Russell w/[facility] [phone] ext [phone] stated she received a call from [facility], they have not received a referral for this pt, please resend.

## 2023-09-23 ENCOUNTER — Ambulatory Visit (HOSPITAL_COMMUNITY): Admitting: Occupational Therapy

## 2023-09-23 ENCOUNTER — Encounter (HOSPITAL_COMMUNITY): Payer: Self-pay | Admitting: Occupational Therapy

## 2023-09-23 ENCOUNTER — Other Ambulatory Visit (HOSPITAL_COMMUNITY): Payer: Self-pay | Admitting: Internal Medicine

## 2023-09-23 DIAGNOSIS — M79645 Pain in left finger(s): Secondary | ICD-10-CM | POA: Diagnosis not present

## 2023-09-23 DIAGNOSIS — Z1231 Encounter for screening mammogram for malignant neoplasm of breast: Secondary | ICD-10-CM

## 2023-09-23 DIAGNOSIS — R29898 Other symptoms and signs involving the musculoskeletal system: Secondary | ICD-10-CM

## 2023-09-23 NOTE — Therapy (Signed)
 OUTPATIENT OCCUPATIONAL THERAPY ORTHO TREATMENT  Patient Name: Jessica Russell MRN: [id number] DOB:15-Jan-1928, 89 y.o., female Today's Date: 09/23/2023   END OF SESSION:  OT End of Session - 09/23/23 1520     Visit Number 3    Number of Visits 4    Date for OT Re-Evaluation 10/07/23    Authorization Type [employer]    Authorization Time Period no auth required    Progress Note Due on Visit 10    OT Start Time 1516    OT Stop Time 1556    OT Time Calculation (min) 40 min    Activity Tolerance Patient tolerated treatment well    Behavior During Therapy WFL for tasks assessed/performed               Past Medical History:  Diagnosis Date   Diabetic nephropathy (HCC)    Diverticulitis    Foot ulcer, right, with fat layer exposed (HCC)    non-healing   History of bilateral breast cancer 04-14-2018 per pt no recurrence   dx 2000 w/ right breast cancer, s/p  lumpectomy with node dissection completed radiation/  dx 2002 w/ left breast cancer, s/p lumpectomy with node dissection,  completed radiation   History of diverticulitis of colon 2009   s/p  partial colectomy for perforated diverticulitis   History of external beam radiation therapy    2000 for right breast cancer;   2002  for left breast cancer  (per pt 36 treatment for each breast)   HLD (hyperlipidemia) 03/24/2017   Neuropathy    OA (osteoarthritis)    Osteoporosis 04/14/2017   Peripheral arterial occlusive disease (HCC)    04-13-2018   s/p  balloon angioplasty to the anterior tibial artery for severe stenosis right lower extremity (dr Alvenia Aus)   Personal history of radiation therapy    both breasts   Rheumatoid arthritis (HCC) 03/13/2019   Type 2 diabetes mellitus (HCC)    Vestibular schwannoma (HCC)     S/p Gamma Knife stereotactic radiosurgery April 2022   Vitamin D  deficiency disease 03/13/2019   Past Surgical History:  Procedure Laterality Date   ABDOMINAL AORTOGRAM W/LOWER EXTREMITY N/A 04/13/2018    Procedure: ABDOMINAL AORTOGRAM W/LOWER EXTREMITY;  Surgeon: Wenona Hamilton, MD;  Location: [facility];  Service: Cardiovascular;  Laterality: N/A;   APPENDECTOMY  age 83   BREAST LUMPECTOMY Right 2000   BREAST LUMPECTOMY Left 2002   BREAST LUMPECTOMY WITH AXILLARY LYMPH NODE DISSECTION Bilateral right 2000;   left 2002   BUNIONECTOMY Bilateral 1968   CARPAL TUNNEL RELEASE Right 2017   CATARACT EXTRACTION W/PHACO Right 08/30/2017   Procedure: CATARACT EXTRACTION PHACO AND INTRAOCULAR LENS PLACEMENT RIGHT EYE;  Surgeon: Anner Kill, MD;  Location: [facility];  Service: Ophthalmology;  Laterality: Right;  CDE: 13.63   CATARACT EXTRACTION W/PHACO Left 09/20/2017   Procedure: CATARACT EXTRACTION PHACO AND INTRAOCULAR LENS PLACEMENT LEFT EYE;  Surgeon: Anner Kill, MD;  Location: [facility];  Service: Ophthalmology;  Laterality: Left;  CDE: 11.25   D & C HYSTEROSCOPY W/ RESECTION POLYP  2017   per pt benign   ESOPHAGOGASTRODUODENOSCOPY (EGD) WITH PROPOFOL   06/22/2023   Procedure: ESOPHAGOGASTRODUODENOSCOPY (EGD) WITH PROPOFOL ;  Surgeon: Urban Garden, MD;  Location: [facility];  Service: Gastroenterology;;   FOOT SURGERY     removal of two bones   FRACTURE SURGERY Left 05/12/2016   patella fracture,   per pt no retatined hardware   GRAFT APPLICATION N/A 04/18/2018  Procedure: APPLICATION OF STRAVIX GRAFT;  Surgeon: Radene Buffalo, DPM;  Location: [facility];  Service: Podiatry;  Laterality: N/A;   HEMICOLECTOMY  2009   diverticulitis w/ perforation   METATARSAL HEAD EXCISION N/A 04/18/2018   Procedure: FIRST METATARSAL HEAD RECESSION, WOUND DEBRIEDMENT;  Surgeon: Radene Buffalo, DPM;  Location: [facility];  Service: Podiatry;  Laterality: N/A;   NASAL SEPTOPLASTY W/ TURBINOPLASTY Bilateral 01/04/2018   Procedure: NASAL SEPTOPLASTY WITH BILATERAL TURBINATE REDUCTION;  Surgeon: Reynold Caves, MD;  Location: [facility];  Service: ENT;   Laterality: Bilateral;   PERIPHERAL VASCULAR BALLOON ANGIOPLASTY  04/13/2018   Procedure: PERIPHERAL VASCULAR BALLOON ANGIOPLASTY;  Surgeon: Wenona Hamilton, MD;  [facility]: [facility];  Service: Cardiovascular;;  Anterior Tibial   SKIN GRAFT     left foot    VENTRAL HERNIA REPAIR  01-07-2016    [facility] [place], [place]   w/ mesh   Patient Active Problem List   Diagnosis Date Noted   Dysphagia 06/03/2023   Dizziness 03/08/2023   Sensorineural hearing loss, unilateral, right ear, with unrestricted hearing on the contralateral side 03/08/2023   Chronic rhinitis 03/08/2023   Hypertrophy of nasal turbinates 03/08/2023   Postnasal drip 03/08/2023   Impacted cerumen of both ears 03/08/2023   Leg swelling 12/17/2022   PAD (peripheral artery disease) (HCC) 12/17/2022   Chronic cough 06/10/2022   Throat irritation 05/07/2022   URI (upper respiratory infection) 04/07/2022   Diverticulitis 07/16/2021   Encounter for examination following treatment at hospital 07/16/2021   Breast pain, left 04/29/2021   Recurrent falls 03/21/2021   Status post gamma knife treatment 02/13/2021   Constipation 08/28/2020   Vestibular schwannoma (HCC) 07/05/2020   Voiding dysfunction 05/15/2020   Urge incontinence of urine 02/28/2020   Incomplete emptying of bladder 02/28/2020   Neuropathy 02/28/2020   Vitamin D  deficiency disease 03/13/2019   Rheumatoid arthritis (HCC) 03/13/2019   Positive fecal occult blood test 12/12/2018   Hemorrhoids 12/12/2018   Rectal bleeding 12/12/2018   Uterovaginal prolapse, incomplete 07/04/2018   Pessary maintenance 07/04/2018   Abscess of right foot    Type 2 diabetes mellitus with neurological complications (HCC) 05/03/2017   Diabetic foot ulcer (HCC) 05/03/2017   Chronic osteoarthritis 04/14/2017   Osteopenia 04/14/2017   Diabetic nephropathy (HCC) 03/24/2017   Diabetic neuropathy (HCC) 03/24/2017   Cataracts, bilateral 03/24/2017   History of bilateral breast cancer  03/24/2017   Recurrent UTI 03/24/2017   HLD (hyperlipidemia) 03/24/2017   Diverticulosis 03/24/2017   PCP: Dr. Artemisa Bile REFERRING PROVIDER: Dr. Pleasant Brilliant  ONSET DATE: 07/01/23  REFERRING DIAG: O96.295M (ICD-10-CM) - Closed dislocation of finger of left hand, initial encounter   THERAPY DIAG:  Pain in left finger(s)  Other symptoms and signs involving the musculoskeletal system  Rationale for Evaluation and Treatment: Rehabilitation  SUBJECTIVE:   SUBJECTIVE STATEMENT: S: "I had a little setback with the weather."  PERTINENT HISTORY: Pt is a 89 y/o female s/p fall on 07/01/23 resulting in 3rd digit PIP joint dislocation with realignment in the ER. Pt initially using a finger splint, is no longer using. Pt presenting with pain and stiffness today.   PRECAUTIONS: None  WEIGHT BEARING RESTRICTIONS: No  PAIN:  Are you having pain? No  FALLS: Has patient fallen in last 6 months? Yes. Number of falls 1  PLOF: Independent  PATIENT GOALS: To have less pain and more mobility in the right 3rd digit.   NEXT MD VISIT: None scheduled  OBJECTIVE:  Note: Objective measures were completed at Evaluation unless otherwise noted.  HAND DOMINANCE: Right  ADLs: Pt reports difficulty with making a fist, reports fixing food and cleaning the kitchen is difficulty due to limited ability to grasp and lift items.   FUNCTIONAL OUTCOME MEASURES: Quick Dash: 43.18  UPPER EXTREMITY ROM:      Active ROM Left eval Left 09/16/23  Thumb MCP (0-60)    Thumb IP (0-80)    Thumb Opposition to Small Finger     Index MCP (0-90)     Index PIP (0-100)     Index DIP (0-70)     Long MCP (0-90) 70  72  Long PIP (0-100)  82 82  Long DIP (0-70)  38 70  Ring MCP (0-90)     Ring PIP (0-100)     Ring DIP (0-70)     Little MCP (0-90)     Little PIP (0-100)     Little DIP (0-70)     (Blank rows = not tested)   HAND FUNCTION: Grip strength: Right: 58 lbs; Left: 24 lbs, Lateral pinch: Right: 9  lbs, Left: 6 lbs, and 3 point pinch: Right: 8 lbs, Left: 7 lbs  SENSATION: Pt reports burning and numbness in bilateral hands at baseline secondary to neuropathy  EDEMA: 3rd digit PIP: right-6.5cm; left-7.25cm  COGNITION: Overall cognitive status: Within functional limits for tasks assessed   TREATMENT DATE:   09/23/23 -PIP digit flexion, composite digit flexion/extension, straight fist, 10 reps -Sponges: 23, 23 -Hand gripper: large and medium beads at 29# resistance, vertical  -Small pegboard: pt using tweezers to grasp and place small pegs into pegboard, maintaining 3 point pinch on tweezers. Increased time for task.  -Theraputty: yellow-ball, flatten, roll, pinch, 2 rounds  09/16/23 -Manual techniques-retrograde massage to left 3rd digit to decrease edema and improve joint mobility -Sponges: 23, 23 -Composite digit flexion/extension, 10 reps -Hand gripper: large and medium beads at 25# resistance, vertical  -Grooved pegboard: pt holding 5 pegs in palm and using tip pinch to pick up a peg from the bowl. Pt unable to translate to fingertips due to numbness in her hands and fingers. Completed 7 pegs while maintaining grasp on 5 pegs in palm. -Theraputty: red-flatten, using pvc pipe to cut circles into putty, roll, grip  Eval:  -Towel crumple: 5 reps -Composite flexion/extension: 5 reps -Opposition to 3rd digit: 5 reps                                                                                                                           -Finger taps: 5 reps -Joint Blocking: PIP and DIP-5 reps -Digit abduction/adduction: 5 reps   PATIENT EDUCATION: Education details: theraputty grip strengthening-yellow Person educated: Patient Education method: Explanation, Demonstration, and Handouts Education comprehension: verbalized understanding and returned demonstration  HOME EXERCISE PROGRAM: Eval: finger A/ROM 5/15: Theraputty grip strengthening-yellow   GOALS: Goals reviewed  with patient? Yes  SHORT TERM GOALS: Target date: 10/07/23  Pt will be provided with and educated on HEP to improve mobility in left hand required for ADL task completion.   Goal status: IN PROGRESS  2.  Pt will improve A/ROM of left 3rd digit by 10 degrees to improve ability to grasp and use a washcloth.   Goal status: IN PROGRESS  3.  Pt will increase left grip strength by 10# or greater to improve ability to use LUE to lift and move items during meal preparation and cleanup.   Goal status: IN PROGRESS  4.  Pt will decrease pain in left 3rd digit to 4/10 or less to improve ability to make a fist required for grasp.  Goal status: IN PROGRESS  5.  Pt will be educated on AE available to use to improve success and decrease pain with ADLs when attempting to use the LUE.   Goal status: IN PROGRESS    ASSESSMENT:  CLINICAL IMPRESSION: Pt reports the weather seemed to have set her back and she cannot make a fist now. Completed A/ROM and pt progressively improving her ROM, able to make a fist after finger ROM tasks. Continued with grip strengthening with pt increasing hand gripper resistance with min difficulty. Added pegboard task with tweezers, increased time and verbal cuing for correct technique. Updated HEP for grip strengthening this session.    PERFORMANCE DEFICITS: in functional skills including ADLs, IADLs, coordination, dexterity, sensation, edema, ROM, strength, pain, fascial restrictions, and UE functional use      PLAN:  OT FREQUENCY: 1x/week  OT DURATION: 4 weeks  PLANNED INTERVENTIONS: 97168 OT Re-evaluation, 97535 self care/ADL training, 16109 therapeutic exercise, 97530 therapeutic activity, 97112 neuromuscular re-education, 97140 manual therapy, 97035 ultrasound, 97014 electrical stimulation unattended, patient/family education, and DME and/or AE instructions  CONSULTED AND AGREED WITH PLAN OF CARE: Patient  PLAN FOR NEXT SESSION: Follow up on HEP, A/ROM and  grip strengthening   Lafonda Piety, OTR/L  [phone] 09/23/2023, 4:03 PM

## 2023-09-23 NOTE — Patient Instructions (Signed)

## 2023-09-23 NOTE — Telephone Encounter (Signed)
 Per [facility] they never received the referral. I submitted the referral to [facility] to [facility]

## 2023-09-30 ENCOUNTER — Ambulatory Visit (HOSPITAL_COMMUNITY): Admitting: Occupational Therapy

## 2023-09-30 ENCOUNTER — Encounter (HOSPITAL_COMMUNITY): Payer: Self-pay | Admitting: Occupational Therapy

## 2023-09-30 DIAGNOSIS — M79645 Pain in left finger(s): Secondary | ICD-10-CM | POA: Diagnosis not present

## 2023-09-30 DIAGNOSIS — R29898 Other symptoms and signs involving the musculoskeletal system: Secondary | ICD-10-CM

## 2023-09-30 NOTE — Therapy (Signed)
 OUTPATIENT OCCUPATIONAL THERAPY ORTHO TREATMENT REASSESSMENT AND DISCHARGE SUMMARY  Patient Name: Jessica Russell MRN: [id number] DOB:[birth year], 89 y.o., female Today's Date: 09/30/2023   OCCUPATIONAL THERAPY DISCHARGE SUMMARY  Visits from Start of Care: 4  Current functional level related to goals / functional outcomes: See below. Pt has met 4/5 goals, partially met remaining goal. Improved function and use of the left hand.    Remaining deficits: Decreased grip strength, intermittent pain   Education / Equipment: HEP, AE education   Patient agrees to discharge. Patient goals were met. Patient is being discharged due to meeting the stated rehab goals..     END OF SESSION:  OT End of Session - 09/30/23 1335     Visit Number 4    Number of Visits 4    Date for OT Re-Evaluation 10/07/23    Authorization Type [employer]    Authorization Time Period no auth required    Progress Note Due on Visit 10    OT Start Time 1300    OT Stop Time 1333    OT Time Calculation (min) 33 min    Activity Tolerance Patient tolerated treatment well    Behavior During Therapy WFL for tasks assessed/performed                Past Medical History:  Diagnosis Date   Diabetic nephropathy (HCC)    Diverticulitis    Foot ulcer, right, with fat layer exposed (HCC)    non-healing   History of bilateral breast cancer 04-14-2018 per pt no recurrence   dx 2000 w/ right breast cancer, s/p  lumpectomy with node dissection completed radiation/  dx 2002 w/ left breast cancer, s/p lumpectomy with node dissection,  completed radiation   History of diverticulitis of colon 2009   s/p  partial colectomy for perforated diverticulitis   History of external beam radiation therapy    2000 for right breast cancer;   2002  for left breast cancer  (per pt 36 treatment for each breast)   HLD (hyperlipidemia) 03/24/2017   Neuropathy    OA (osteoarthritis)    Osteoporosis 04/14/2017   Peripheral  arterial occlusive disease (HCC)    04-13-2018   s/p  balloon angioplasty to the anterior tibial artery for severe stenosis right lower extremity (dr Alvenia Aus)   Personal history of radiation therapy    both breasts   Rheumatoid arthritis (HCC) 03/13/2019   Type 2 diabetes mellitus (HCC)    Vestibular schwannoma [facility])     S/p Gamma Knife stereotactic radiosurgery April 2022   Vitamin D  deficiency disease 03/13/2019   Past Surgical History:  Procedure Laterality Date   ABDOMINAL AORTOGRAM W/LOWER EXTREMITY N/A 04/13/2018   Procedure: ABDOMINAL AORTOGRAM W/LOWER EXTREMITY;  Surgeon: Wenona Hamilton, MD;  [facility];  Service: Cardiovascular;  Laterality: N/A;   APPENDECTOMY  age 72   BREAST LUMPECTOMY Right 2000   BREAST LUMPECTOMY Left 2002   BREAST LUMPECTOMY WITH AXILLARY LYMPH NODE DISSECTION Bilateral right 2000;   left 2002   BUNIONECTOMY Bilateral 1968   CARPAL TUNNEL RELEASE Right 2017   CATARACT EXTRACTION W/PHACO Right 08/30/2017   Procedure: CATARACT EXTRACTION PHACO AND INTRAOCULAR LENS PLACEMENT RIGHT EYE;  Surgeon: Anner Kill, MD;  Location: [facility];  Service: Ophthalmology;  Laterality: Right;  CDE: 13.63   CATARACT EXTRACTION W/PHACO Left 09/20/2017   Procedure: CATARACT EXTRACTION PHACO AND INTRAOCULAR LENS PLACEMENT LEFT EYE;  Surgeon: Anner Kill, MD;  Location: [facility];  Service: Ophthalmology;  Laterality: Left;  CDE: 11.25   D & C HYSTEROSCOPY W/ RESECTION POLYP  2017   per pt benign   ESOPHAGOGASTRODUODENOSCOPY (EGD) WITH PROPOFOL   06/22/2023   Procedure: ESOPHAGOGASTRODUODENOSCOPY (EGD) WITH PROPOFOL ;  Surgeon: Urban Garden, MD;  [facility];  Service: Gastroenterology;;   FOOT SURGERY     removal of two bones   FRACTURE SURGERY Left 05/12/2016   patella fracture,   per pt no retatined hardware   GRAFT APPLICATION N/A 04/18/2018   Procedure: APPLICATION OF STRAVIX GRAFT;  Surgeon: Radene Buffalo, DPM;  Location: [facility];  Service: Podiatry;  Laterality: N/A;   HEMICOLECTOMY  2009   diverticulitis w/ perforation   METATARSAL HEAD EXCISION N/A 04/18/2018   Procedure: FIRST METATARSAL HEAD RECESSION, WOUND DEBRIEDMENT;  Surgeon: Radene Buffalo, DPM;  [facility];  Service: Podiatry;  Laterality: N/A;   NASAL SEPTOPLASTY W/ TURBINOPLASTY Bilateral 01/04/2018   Procedure: NASAL SEPTOPLASTY WITH BILATERAL TURBINATE REDUCTION;  Surgeon: Reynold Caves, MD;  Location: [facility];  Service: ENT;  Laterality: Bilateral;   PERIPHERAL VASCULAR BALLOON ANGIOPLASTY  04/13/2018   Procedure: PERIPHERAL VASCULAR BALLOON ANGIOPLASTY;  Surgeon: Wenona Hamilton, MD;  [facility];  Service: Cardiovascular;;  Anterior Tibial   SKIN GRAFT     left foot    VENTRAL HERNIA REPAIR  01-07-2016    [facility] [place], [place]   w/ mesh   Patient Active Problem List   Diagnosis Date Noted   Dysphagia 06/03/2023   Dizziness 03/08/2023   Sensorineural hearing loss, unilateral, right ear, with unrestricted hearing on the contralateral side 03/08/2023   Chronic rhinitis 03/08/2023   Hypertrophy of nasal turbinates 03/08/2023   Postnasal drip 03/08/2023   Impacted cerumen of both ears 03/08/2023   Leg swelling 12/17/2022   PAD (peripheral artery disease) (HCC) 12/17/2022   Chronic cough 06/10/2022   Throat irritation 05/07/2022   URI (upper respiratory infection) 04/07/2022   Diverticulitis 07/16/2021   Encounter for examination following treatment at hospital 07/16/2021   Breast pain, left 04/29/2021   Recurrent falls 03/21/2021   Status post gamma knife treatment 02/13/2021   Constipation 08/28/2020   Vestibular schwannoma (HCC) 07/05/2020   Voiding dysfunction 05/15/2020   Urge incontinence of urine 02/28/2020   Incomplete emptying of bladder 02/28/2020   Neuropathy 02/28/2020   Vitamin D  deficiency disease 03/13/2019   Rheumatoid arthritis (HCC) 03/13/2019   Positive fecal  occult blood test 12/12/2018   Hemorrhoids 12/12/2018   Rectal bleeding 12/12/2018   Uterovaginal prolapse, incomplete 07/04/2018   Pessary maintenance 07/04/2018   Abscess of right foot    Type 2 diabetes mellitus with neurological complications (HCC) 05/03/2017   Diabetic foot ulcer (HCC) 05/03/2017   Chronic osteoarthritis 04/14/2017   Osteopenia 04/14/2017   Diabetic nephropathy (HCC) 03/24/2017   Diabetic neuropathy (HCC) 03/24/2017   Cataracts, bilateral 03/24/2017   History of bilateral breast cancer 03/24/2017   Recurrent UTI 03/24/2017   HLD (hyperlipidemia) 03/24/2017   Diverticulosis 03/24/2017   PCP: Dr. Artemisa Bile REFERRING PROVIDER: Dr. Pleasant Brilliant  ONSET DATE: 07/01/23  REFERRING DIAG: O13.086V (ICD-10-CM) - Closed dislocation of finger of left hand, initial encounter   THERAPY DIAG:  Pain in left finger(s)  Other symptoms and signs involving the musculoskeletal system  Rationale for Evaluation and Treatment: Rehabilitation  SUBJECTIVE:   SUBJECTIVE STATEMENT: S: "At night my hand and finger swell."  PERTINENT HISTORY: Pt is a 89 y/o female s/p fall on  07/01/23 resulting in 3rd digit PIP joint dislocation with realignment in the ER. Pt initially using a finger splint, is no longer using. Pt presenting with pain and stiffness today.   PRECAUTIONS: None  WEIGHT BEARING RESTRICTIONS: No  PAIN:  Are you having pain? No  FALLS: Has patient fallen in last 6 months? Yes. Number of falls 1  PLOF: Independent  PATIENT GOALS: To have less pain and more mobility in the right 3rd digit.   NEXT MD VISIT: None scheduled  OBJECTIVE:  Note: Objective measures were completed at Evaluation unless otherwise noted.  HAND DOMINANCE: Right  ADLs: Pt reports difficulty with making a fist, reports fixing food and cleaning the kitchen is difficulty due to limited ability to grasp and lift items.   FUNCTIONAL OUTCOME MEASURES: Quick Dash: 43.18 5/22: 20.45  UPPER  EXTREMITY ROM:      Active ROM Left eval Left 09/16/23 Left 10/07/23  Thumb MCP (0-60)     Thumb IP (0-80)     Thumb Opposition to Small Finger      Index MCP (0-90)      Index PIP (0-100)      Index DIP (0-70)      Long MCP (0-90) 70  72 80  Long PIP (0-100)  82 82 86  Long DIP (0-70)  38 70 70  Ring MCP (0-90)      Ring PIP (0-100)      Ring DIP (0-70)      Little MCP (0-90)      Little PIP (0-100)      Little DIP (0-70)      (Blank rows = not tested)   HAND FUNCTION: Grip strength: Right: 58 lbs; Left: 24 lbs, Lateral pinch: Right: 9 lbs, Left: 6 lbs, and 3 point pinch: Right: 8 lbs, Left: 7 lbs 09/30/23: Grip strength: Left 43, lateral pinch: left 10; 3 point pinch: left 12  SENSATION: Pt reports burning and numbness in bilateral hands at baseline secondary to neuropathy  EDEMA: 3rd digit PIP: right-6.5cm; left-7.25cm                3rd digit PIP: left-6.75cm    TREATMENT DATE:  09/30/23 -PIP digit flexion, composite digit flexion/extension, straight fist, 10 reps -Sponges: 23, 23 -Hand gripper: large and medium beads at 35# resistance, vertical  -Educated on AE available to improve success and independence as well as promote joint protection including jar openers, rocker knife, built up utensils, medication bottle openers  09/23/23 -PIP digit flexion, composite digit flexion/extension, straight fist, 10 reps -Sponges: 23, 23 -Hand gripper: large and medium beads at 29# resistance, vertical  -Small pegboard: pt using tweezers to grasp and place small pegs into pegboard, maintaining 3 point pinch on tweezers. Increased time for task.  -Theraputty: yellow-ball, flatten, roll, pinch, 2 rounds  09/16/23 -Manual techniques-retrograde massage to left 3rd digit to decrease edema and improve joint mobility -Sponges: 23, 23 -Composite digit flexion/extension, 10 reps -Hand gripper: large and medium beads at 25# resistance, vertical  -Grooved pegboard: pt holding 5 pegs in  palm and using tip pinch to pick up a peg from the bowl. Pt unable to translate to fingertips due to numbness in her hands and fingers. Completed 7 pegs while maintaining grasp on 5 pegs in palm. -Theraputty: red-flatten, using pvc pipe to cut circles into putty, roll, grip    PATIENT EDUCATION: Education details: AE education Person educated: Patient Education method: Explanation, Demonstration, and Handouts Education comprehension: verbalized understanding and returned  demonstration  HOME EXERCISE PROGRAM: Eval: finger A/ROM 5/15: Theraputty grip strengthening-yellow 5/22: AE education   GOALS: Goals reviewed with patient? Yes  SHORT TERM GOALS: Target date: 10/07/23  Pt will be provided with and educated on HEP to improve mobility in left hand required for ADL task completion.   Goal status: MET  2.  Pt will improve A/ROM of left 3rd digit by 10 degrees to improve ability to grasp and use a washcloth.   Goal status: PARTIALLY MET  3.  Pt will increase left grip strength by 10# or greater to improve ability to use LUE to lift and move items during meal preparation and cleanup.   Goal status: MET  4.  Pt will decrease pain in left 3rd digit to 4/10 or less to improve ability to make a fist required for grasp.  Goal status: MET  5.  Pt will be educated on AE available to use to improve success and decrease pain with ADLs when attempting to use the LUE.   Goal status: MET    ASSESSMENT:  CLINICAL IMPRESSION: Reassessment completed this session, pt presenting with full fist, reports she continues to have arthritic pain at night, sometimes feels like a rubberband round her proximal hand immediately above the wrist. Pt has met 4/5 goals with remaining goal partially met. Pt has improved ROM, grip and pinch strength, as well as functional use of the left hand. Educated pt on AE available for purchase, discussing joint protection considering RA and OA diagnoses. Pt is  agreeable to discharge today.    PERFORMANCE DEFICITS: in functional skills including ADLs, IADLs, coordination, dexterity, sensation, edema, ROM, strength, pain, fascial restrictions, and UE functional use      PLAN:  OT FREQUENCY: 1x/week  OT DURATION: 4 weeks  PLANNED INTERVENTIONS: 97168 OT Re-evaluation, 97535 self care/ADL training, 16109 therapeutic exercise, 97530 therapeutic activity, 97112 neuromuscular re-education, 97140 manual therapy, 97035 ultrasound, 97014 electrical stimulation unattended, patient/family education, and DME and/or AE instructions  CONSULTED AND AGREED WITH PLAN OF CARE: Patient  PLAN FOR NEXT SESSION: N/A-discharge today   Lafonda Piety, OTR/L  [phone] 09/30/2023, 1:36 PM

## 2023-10-07 ENCOUNTER — Encounter (HOSPITAL_COMMUNITY): Admitting: Occupational Therapy

## 2023-10-12 ENCOUNTER — Encounter (INDEPENDENT_AMBULATORY_CARE_PROVIDER_SITE_OTHER): Payer: Self-pay | Admitting: Otolaryngology

## 2023-10-12 ENCOUNTER — Ambulatory Visit (INDEPENDENT_AMBULATORY_CARE_PROVIDER_SITE_OTHER): Admitting: Otolaryngology

## 2023-10-12 ENCOUNTER — Ambulatory Visit (INDEPENDENT_AMBULATORY_CARE_PROVIDER_SITE_OTHER): Payer: Self-pay | Admitting: Audiology

## 2023-10-12 VITALS — BP 145/84 | HR 69 | Ht 61.0 in | Wt 163.0 lb

## 2023-10-12 DIAGNOSIS — R42 Dizziness and giddiness: Secondary | ICD-10-CM | POA: Diagnosis not present

## 2023-10-12 DIAGNOSIS — R0982 Postnasal drip: Secondary | ICD-10-CM

## 2023-10-12 DIAGNOSIS — R0981 Nasal congestion: Secondary | ICD-10-CM | POA: Diagnosis not present

## 2023-10-12 DIAGNOSIS — H6123 Impacted cerumen, bilateral: Secondary | ICD-10-CM

## 2023-10-12 DIAGNOSIS — H903 Sensorineural hearing loss, bilateral: Secondary | ICD-10-CM

## 2023-10-12 DIAGNOSIS — J310 Chronic rhinitis: Secondary | ICD-10-CM | POA: Diagnosis not present

## 2023-10-12 DIAGNOSIS — J343 Hypertrophy of nasal turbinates: Secondary | ICD-10-CM

## 2023-10-12 DIAGNOSIS — H9041 Sensorineural hearing loss, unilateral, right ear, with unrestricted hearing on the contralateral side: Secondary | ICD-10-CM

## 2023-10-12 DIAGNOSIS — D333 Benign neoplasm of cranial nerves: Secondary | ICD-10-CM

## 2023-10-12 NOTE — Progress Notes (Signed)
  [address] [place], [place] [address] [phone]  Hearing Aid Check     Jessica Russell I Engelken comes for a scheduled appointment for a hearing aid check.    Accompanied MV:HQIONGE     Right Left  Hearing aid manufacturer Oticon  OticonReal 3 miniRITE  Hearing aid style Receiver in the ear Receiver in the ear  Hearing aid battery rechargeable rechargeable  Receiver 3-85 3-85  Dome/ custom earpiece Open dome 8mm Open dome 8mm Also has a Mold -hard minifit with canal lock-SN:[id number]   Retention wire none none  Warranty expiration date 11-02-2024 11-02-2024  Loss and Damage      Additional accessories Expiration date Remote control [id number] Smart Charger [id number]    Initial fitting date 10-15-2021 10-15-2021  Device was fit at: [facility]  Hearing aid services Bundled until: 11-02-2024   Bundled until:11-02-2024             Chief complaint: Patient reports continues to notice intermittent function/charge.  Actions taken: Patient agreed to have both aids sent in for repair.   *Patient also notes retention issues in the left side. Also reports that the app function is intermittent.*  Services fee: $0 was paid at checkout.     Recommend: Return for a hearing aid check when the aids return to the clinic.  Return for a hearing evaluation and to see an ENT, if concerns with hearing changes arise.    Jessica Russell MARIE LEROUX-MARTINEZ, AUD

## 2023-10-13 NOTE — Progress Notes (Signed)
 Patient ID: Jessica Russell, female   DOB: [birth year], 89 y.o.   MRN: [id number]  Follow-up: Right IAC acoustic neuroma, recurrent dizziness, asymmetric hearing loss, chronic nasal congestion  HPI: The patient is a 89 year old female who returns today for her follow-up evaluation.  The patient was previously seen for recurrent dizziness, asymmetric right ear sensorineural hearing loss, right IAC acoustic neuroma, and chronic nasal congestion/eustachian tube dysfunction.  Her 1.6 cm right IAC acoustic neuroma was treated with gamma knife radiation in March 2022.  Her hearing loss was treated with bilateral hearing aids.  The patient was also treated with Flonase, Atrovent , and Valsalva exercise.  The patient returns today reporting occasional dizziness.  She describes her dizziness as an off-balance and lightheaded sensation.  She still has hearing difficulty.  However, she has not noted any recent change in her hearing.  Currently she denies any otalgia, otorrhea, facial pain, or fever.  Exam: General: Communicates without difficulty, well nourished, no acute distress. Head: Normocephalic, no evidence injury, no tenderness, facial buttresses intact without stepoff. Eyes: PERRL, EOMI. No scleral icterus, conjunctivae clear. Neuro: CN II exam reveals vision grossly intact.  No nystagmus at any point of gaze. Ears: Bilateral cerumen impaction.  The left tympanic membrane and middle ear space are normal.  Nose: External evaluation reveals normal support and skin without lesions.  Dorsum is intact.  Anterior rhinoscopy reveals congested and edematous mucosa over anterior aspect of the inferior turbinates and deviated nasal septum. Oral:  Oral cavity and oropharynx are intact, symmetric, without erythema or edema.  Mucosa is moist without lesions. Neck: Full range of motion without pain.  There is no significant lymphadenopathy.  No masses palpable.  Thyroid  bed within normal limits to palpation.  Parotid glands  and submandibular glands equal bilaterally without mass.  Trachea is midline. Neuro:  CN 2-12 grossly intact. Gait wide-based. Vestibular: There is no nystagmus with pneumatic pressure on either tympanic membrane or Valsalva. The cerebellar examination is unremarkable.   Procedure: Bilateral cerumen disimpaction Anesthesia: None Description: Under the operating microscope, the cerumen is carefully removed with a combination of cerumen currette, alligator forceps, and suction catheters.  After the cerumen is removed, the TMs are noted to be normal.  No mass, erythema, or lesions. The patient tolerated the procedure well.    Assessment: 1.  Incidental finding of bilateral cerumen impaction.  After the disimpaction procedure, both tympanic membranes and middle ear spaces are noted to be normal. 2.  Recurrent dizziness, likely secondary to her right ear acoustic neuroma. 3.  Subjectively stable bilateral sensorineural hearing loss, worse on the right side. 4.  Chronic rhinitis with nasal mucosal congestion, bilateral inferior turbinate hypertrophy, and bilateral inferior turbinate hypertrophy.  Plan: 1.  Otomicroscopy with bilateral cerumen disimpaction. 2.  The physical exam findings are reviewed with the patient. 3.  The pathophysiology of dizziness and vestibular dysfunction are discussed with the patient.  Questions are invited and answered. 4.  Continue with Flonase, Atrovent , and Valsalva exercise daily. 5.  Continue the use of her hearing aids. 6.  The patient will return for reevaluation in 6 months, sooner if needed.

## 2023-10-14 ENCOUNTER — Encounter (HOSPITAL_COMMUNITY): Admitting: Occupational Therapy

## 2023-10-27 ENCOUNTER — Ambulatory Visit (INDEPENDENT_AMBULATORY_CARE_PROVIDER_SITE_OTHER): Payer: Self-pay | Admitting: Audiology

## 2023-10-27 DIAGNOSIS — H903 Sensorineural hearing loss, bilateral: Secondary | ICD-10-CM

## 2023-10-28 NOTE — Progress Notes (Signed)
  [address] [place], [place] [address] [phone]  Hearing Aid Check     Jessica Russell I Langston comes for a scheduled appointment for a hearing aid check.    Accompanied MV:HQIONGE     Right Left  Hearing aid manufacturer Oticon  OticonReal 3 miniRITE  Hearing aid style Receiver in the ear Receiver in the ear  Hearing aid battery rechargeable rechargeable  Receiver 3-85 3-85  Dome/ custom earpiece Open dome 8mm Open dome 8mm Also has a Mold -hard minifit with canal lock-SN:[id number]   Retention wire none none  Warranty expiration date 11-02-2024 11-02-2024  Loss and Damage      Additional accessories Expiration date Remote control [id number] Smart Charger [id number]    Initial fitting date 10-15-2021 10-15-2021  Device was fit at: [facility]  Hearing aid services Bundled until: 11-02-2024   Bundled until:11-02-2024        Patient comes to pick up aids after they were sent in for repair. Both were programmed based on last settings on file. Patient reports both sound well. How to use the Companion app was explained.Returned Production assistant, radio to the patient.     Services fee: $0 was paid at checkout.    Recommend: Return for a hearing aid check , as needed. Return for a hearing evaluation and to see an ENT, if concerns with hearing changes arise.    Milton Sagona MARIE LEROUX-MARTINEZ, AUD

## 2023-11-04 ENCOUNTER — Encounter (HOSPITAL_COMMUNITY): Payer: Self-pay

## 2023-11-04 ENCOUNTER — Ambulatory Visit (HOSPITAL_COMMUNITY)
Admission: RE | Admit: 2023-11-04 | Discharge: 2023-11-04 | Disposition: A | Discharge status: Home / Self Care | Source: Ambulatory Visit | Attending: Internal Medicine | Admitting: Internal Medicine

## 2023-11-04 DIAGNOSIS — Z1231 Encounter for screening mammogram for malignant neoplasm of breast: Secondary | ICD-10-CM | POA: Insufficient documentation

## 2023-11-11 ENCOUNTER — Other Ambulatory Visit (INDEPENDENT_AMBULATORY_CARE_PROVIDER_SITE_OTHER): Payer: Self-pay | Admitting: Internal Medicine

## 2023-11-16 ENCOUNTER — Encounter: Payer: Self-pay | Admitting: Cardiovascular Disease

## 2023-11-16 ENCOUNTER — Ambulatory Visit: Attending: Cardiovascular Disease | Admitting: Cardiovascular Disease

## 2023-11-16 VITALS — BP 142/66 | HR 69 | Ht 61.0 in | Wt 171.2 lb

## 2023-11-16 DIAGNOSIS — E785 Hyperlipidemia, unspecified: Secondary | ICD-10-CM | POA: Diagnosis not present

## 2023-11-16 DIAGNOSIS — I739 Peripheral vascular disease, unspecified: Secondary | ICD-10-CM | POA: Diagnosis not present

## 2023-11-16 NOTE — Patient Instructions (Signed)
 Medication Instructions:  No changes *If you need a refill on your cardiac medications before your next appointment, please call your pharmacy*  Lab Work: None ordered If you have labs (blood work) drawn today and your tests are completely normal, you will receive your results only by: MyChart Message (if you have MyChart) OR A paper copy in the mail If you have any lab test that is abnormal or we need to change your treatment, we will call you to review the results.  Testing/Procedures: None ordered  Follow-Up: At [facility], you and your health needs are our priority.  As part of our continuing mission to provide you with exceptional heart care, our providers are all part of one team.  This team includes your primary Cardiologist (physician) and Advanced Practice Providers or APPs (Physician Assistants and Nurse Practitioners) who all work together to provide you with the care you need, when you need it.  Your next appointment:   6 month(s)  Provider:   Antionette Kirks, MD    We recommend signing up for the patient portal called "MyChart".  Sign up information is provided on this After Visit Summary.  MyChart is used to connect with patients for Virtual Visits (Telemedicine).  Patients are able to view lab/test results, encounter notes, upcoming appointments, etc.  Non-urgent messages can be sent to your provider as well.   To learn more about what you can do with MyChart, go to [email or web address].

## 2023-11-16 NOTE — Progress Notes (Signed)
 Cardiology Office Note   Date:  11/16/2023   ID:  Jessica Russell, DOB March 05, 1928, MRN [id number]  PCP:  Sheryle Carwin, MD  Cardiologist:   Deatrice Cage, MD   No chief complaint on file.     History of Present Illness: Jessica Russell is a 89 y.o. female who is here today for follow-up visit regarding peripheral arterial disease. She has known history of diabetes mellitus with peripheral neuropathy, rheumatoid arthritis and hyperlipidemia.  She was referred in 2019 by Dr. Zan for evaluation of peripheral arterial disease and nonhealing ulceration on the right foot. Angiography in December 2019 showed no significant aortoiliac disease.  On the right side, there was severe stenosis in the mid anterior tibial artery with occluded dorsalis pedis distally, occluded TP trunk with reconstitution of the peroneal artery proximally via extensive collaterals from the anterior tibial and reconstitution of the distal posterior tibial artery via collaterals from the peroneal artery.  I performed successful balloon angioplasty to the anterior tibial artery. She ultimately underwent resection of first transmetatarsal bone with complete healing.    She had dark discoloration in the right toe last year.  She underwent Doppler testing which showed normal ABI bilaterally but abnormal toe pressure on the right side.  Duplex showed no evidence of obstructive disease.  The discoloration resolved without intervention.  She has been doing well with no lower extremity claudication or ulceration.  Past Medical History:  Diagnosis Date   Diabetic nephropathy (HCC)    Diverticulitis    Foot ulcer, right, with fat layer exposed (HCC)    non-healing   History of bilateral breast cancer 04-14-2018 per pt no recurrence   dx 2000 w/ right breast cancer, s/p  lumpectomy with node dissection completed radiation/  dx 2002 w/ left breast cancer, s/p lumpectomy with node dissection,  completed radiation   History  of diverticulitis of colon 2009   s/p  partial colectomy for perforated diverticulitis   History of external beam radiation therapy    2000 for right breast cancer;   2002  for left breast cancer  (per pt 36 treatment for each breast)   HLD (hyperlipidemia) 03/24/2017   Neuropathy    OA (osteoarthritis)    Osteoporosis 04/14/2017   Peripheral arterial occlusive disease (HCC)    04-13-2018   s/p  balloon angioplasty to the anterior tibial artery for severe stenosis right lower extremity (dr cage)   Personal history of radiation therapy    both breasts   Rheumatoid arthritis (HCC) 03/13/2019   Type 2 diabetes mellitus (HCC)    Vestibular schwannoma (HCC)     S/p Gamma Knife stereotactic radiosurgery April 2022   Vitamin D  deficiency disease 03/13/2019    Past Surgical History:  Procedure Laterality Date   ABDOMINAL AORTOGRAM W/LOWER EXTREMITY N/A 04/13/2018   Procedure: ABDOMINAL AORTOGRAM W/LOWER EXTREMITY;  Surgeon: Cage Deatrice LABOR, MD;  Location: [facility];  Service: Cardiovascular;  Laterality: N/A;   APPENDECTOMY  age 70   BREAST LUMPECTOMY Right 2000   BREAST LUMPECTOMY Left 2002   BREAST LUMPECTOMY WITH AXILLARY LYMPH NODE DISSECTION Bilateral right 2000;   left 2002   BUNIONECTOMY Bilateral 1968   CARPAL TUNNEL RELEASE Right 2017   CATARACT EXTRACTION W/PHACO Right 08/30/2017   Procedure: CATARACT EXTRACTION PHACO AND INTRAOCULAR LENS PLACEMENT RIGHT EYE;  Surgeon: Perley Hamilton, MD;  Location: [facility];  Service: Ophthalmology;  Laterality: Right;  CDE: 13.63   CATARACT EXTRACTION W/PHACO Left 09/20/2017  Procedure: CATARACT EXTRACTION PHACO AND INTRAOCULAR LENS PLACEMENT LEFT EYE;  Surgeon: Perley Hamilton, MD;  Location: [facility];  Service: Ophthalmology;  Laterality: Left;  CDE: 11.25   D & C HYSTEROSCOPY W/ RESECTION POLYP  2017   per pt benign   ESOPHAGOGASTRODUODENOSCOPY (EGD) WITH PROPOFOL   06/22/2023   Procedure: ESOPHAGOGASTRODUODENOSCOPY (EGD) WITH PROPOFOL ;   Surgeon: Eartha Angelia Sieving, MD;  Location: [facility];  Service: Gastroenterology;;   FOOT SURGERY     removal of two bones   FRACTURE SURGERY Left 05/12/2016   patella fracture,   per pt no retatined hardware   GRAFT APPLICATION N/A 04/18/2018   Procedure: APPLICATION OF STRAVIX GRAFT;  Surgeon: Zan Factor, DPM;  Location: [facility];  Service: Podiatry;  Laterality: N/A;   HEMICOLECTOMY  2009   diverticulitis w/ perforation   METATARSAL HEAD EXCISION N/A 04/18/2018   Procedure: FIRST METATARSAL HEAD RECESSION, WOUND DEBRIEDMENT;  Surgeon: Zan Factor, DPM;  Location: [facility];  Service: Podiatry;  Laterality: N/A;   NASAL SEPTOPLASTY W/ TURBINOPLASTY Bilateral 01/04/2018   Procedure: NASAL SEPTOPLASTY WITH BILATERAL TURBINATE REDUCTION;  Surgeon: Karis Clunes, MD;  Location: [facility];  Service: ENT;  Laterality: Bilateral;   PERIPHERAL VASCULAR BALLOON ANGIOPLASTY  04/13/2018   Procedure: PERIPHERAL VASCULAR BALLOON ANGIOPLASTY;  Surgeon: Darron Deatrice LABOR, MD;  Location: [facility];  Service: Cardiovascular;;  Anterior Tibial   SKIN GRAFT     left foot    VENTRAL HERNIA REPAIR  01-07-2016    Joylene Jewel, [place]   w/ mesh     Current Outpatient Medications  Medication Sig Dispense Refill   acetaminophen  (TYLENOL ) 500 MG tablet Take 1,500 mg by mouth at bedtime.     aspirin  81 MG chewable tablet Chew 81 mg by mouth daily.     carboxymethylcellul-glycerin (OPTIVE) 0.5-0.9 % ophthalmic solution Apply to eye.     cholecalciferol (VITAMIN D3) 25 MCG (1000 UNIT) tablet Take 1,000 Units by mouth daily.     fluocinonide gel (LIDEX) 0.05 % Apply 1 application topically as needed (mouth sores).   0   folic acid (FOLVITE) 1 MG tablet Take 1 mg by mouth daily.     gabapentin  (NEURONTIN ) 400 MG capsule TAKE 2 CAPSULES (800 MG TOTAL) BY MOUTH 4 (FOUR) TIMES DAILY. 720 capsule 1   Menaquinone-7 (VITAMIN K2 PO) Take by mouth daily.      metFORMIN  (GLUCOPHAGE ) 500 MG tablet TAKE 1 TABLET BY MOUTH EVERY DAY 90 tablet 1   methotrexate (RHEUMATREX) 2.5 MG tablet Take 15 mg by mouth once a week. Caution:Chemotherapy. Protect from light.     Multiple Vitamins-Minerals (CENTRUM SILVER PO) Take by mouth daily.     MYRBETRIQ  50 MG TB24 tablet Take 1 tablet (50 mg total) by mouth daily. 30 tablet 2   Omega-3 Fatty Acids (RA FISH OIL) 1000 MG CAPS Take 2 capsules by mouth daily.     omeprazole  (PRILOSEC) 20 MG capsule Take 1 capsule (20 mg total) by mouth daily. 90 capsule 3   Polyvinyl Alcohol-Povidone (REFRESH OP) Apply to eye 2 (two) times daily as needed.     Turmeric 1053 MG TABS Take 600 mg by mouth. With black pepper extract     No current facility-administered medications for this visit.    Allergies:   Phenergan [promethazine hcl], Ciprofloxacin, and Levofloxacin    Social History:  The patient  reports that she quit smoking about 60 years ago. Her smoking use included cigarettes.  She started smoking about 73 years ago. She has a 13 pack-year smoking history. She has never used smokeless tobacco. She reports that she does not drink alcohol and does not use drugs.   Family History:  The patient's family history includes Alcohol abuse in her father; Arthritis in her mother; Diabetes in her brother, mother, sister, and sister; Diverticulitis in her son; Early death in her father; Heart disease in her mother; Hypertension in her mother; Miscarriages / Stillbirths in her mother; Stroke in her father, sister, and sister.    ROS:  Please see the history of present illness.   Otherwise, review of systems are positive for none.   All other systems are reviewed and negative.    PHYSICAL EXAM: VS:  BP (!) 142/66   Pulse 69   Ht 5' 1 (1.549 m)   Wt 171 lb 3.2 oz (77.7 kg)   SpO2 98%   BMI 32.35 kg/m  , BMI Body mass index is 32.35 kg/m. GEN: Well nourished, well developed, in no acute distress  HEENT: normal  Neck: no JVD,  carotid bruits, or masses Cardiac: RRR; no  rubs, or gallops. 2/6 systolic murmur in the aortic area which is early peaking.  There is mild swelling involving the left calf with some tenderness. Respiratory:  clear to auscultation bilaterally, normal work of breathing GI: soft, nontender, nondistended, + BS MS: no deformity or atrophy  Skin: warm and dry, no rash Neuro:  Strength and sensation are intact Psych: euthymic mood, full affect Vascular: Distal pulses are very faint.   EKG:  EKG is not ordered today.    Recent Labs: No results found for requested labs within last 365 days.    Lipid Panel    Component Value Date/Time   CHOL 197 03/16/2022 1110   TRIG 334 (H) 03/16/2022 1110   HDL 47 03/16/2022 1110   CHOLHDL 4.2 03/16/2022 1110   CHOLHDL 4.9 02/07/2020 1410   LDLCALC 94 03/16/2022 1110   LDLCALC  02/07/2020 1410     Comment:     . LDL cholesterol not calculated. Triglyceride levels greater than 400 mg/dL invalidate calculated LDL results. . Reference range: <100 . Desirable range <100 mg/dL for primary prevention;   <70 mg/dL for patients with CHD or diabetic patients  with > or = 2 CHD risk factors. SABRA LDL-C is now calculated using the Martin-Hopkins  calculation, which is a validated novel method providing  better accuracy than the Friedewald equation in the  estimation of LDL-C.  Gladis APPLETHWAITE et al. SANDREA. 7986;689(80): 2061-2068  (http://education.[employer].com/faq/FAQ164)       Wt Readings from Last 3 Encounters:  11/16/23 171 lb 3.2 oz (77.7 kg)  10/12/23 163 lb (73.9 kg)  07/08/23 169 lb (76.7 kg)           No data to display            ASSESSMENT AND PLAN:  1.  Peripheral arterial disease with previous nonhealing ulceration on the bottom of the right big toe: Status post balloon angioplasty of the right anterior tibial artery.  No recurrent ulceration or claudication.  Duplex last year was unremarkable.  Currently with no  symptoms.  Continue low-dose aspirin .  2.  Hyperlipidemia: Most recent lipid profile showed significant elevation in triglyceride which was 548.  This is being managed by primary care physician.  3.  Left lower extremity swelling: Likely due to chronic venous insufficiency.  Lower extremity venous Doppler last year was negative for DVT.  Disposition:   FU with me in 6 months  Signed,  Deatrice Cage, MD  11/16/2023 11:30 AM    [facility]

## 2023-11-23 ENCOUNTER — Ambulatory Visit: Admitting: Cardiovascular Disease

## 2023-11-29 ENCOUNTER — Other Ambulatory Visit (INDEPENDENT_AMBULATORY_CARE_PROVIDER_SITE_OTHER): Payer: Self-pay | Admitting: Internal Medicine

## 2023-12-31 ENCOUNTER — Encounter: Payer: Self-pay | Admitting: Radiology

## 2024-01-24 ENCOUNTER — Telehealth (INDEPENDENT_AMBULATORY_CARE_PROVIDER_SITE_OTHER): Payer: Self-pay

## 2024-01-24 NOTE — Telephone Encounter (Signed)
 Pt called and stated she needed your help with her hearing aids and requested a call back please.

## 2024-01-25 ENCOUNTER — Telehealth (INDEPENDENT_AMBULATORY_CARE_PROVIDER_SITE_OTHER): Payer: Self-pay

## 2024-01-25 NOTE — Telephone Encounter (Signed)
 Pt called upset and asking to speak with you as soon as you can please.

## 2024-02-14 ENCOUNTER — Other Ambulatory Visit (HOSPITAL_BASED_OUTPATIENT_CLINIC_OR_DEPARTMENT_OTHER): Payer: Self-pay

## 2024-02-23 ENCOUNTER — Encounter (INDEPENDENT_AMBULATORY_CARE_PROVIDER_SITE_OTHER): Payer: Self-pay | Admitting: Gastroenterology

## 2024-03-13 ENCOUNTER — Encounter: Payer: Self-pay | Admitting: Radiology

## 2024-04-18 ENCOUNTER — Ambulatory Visit (INDEPENDENT_AMBULATORY_CARE_PROVIDER_SITE_OTHER): Admitting: Otolaryngology

## 2024-04-18 ENCOUNTER — Ambulatory Visit (INDEPENDENT_AMBULATORY_CARE_PROVIDER_SITE_OTHER): Admitting: Audiology

## 2024-04-18 ENCOUNTER — Encounter (INDEPENDENT_AMBULATORY_CARE_PROVIDER_SITE_OTHER): Payer: Self-pay | Admitting: Otolaryngology

## 2024-04-18 VITALS — HR 90 | Temp 97.4°F | Ht 61.0 in | Wt 162.0 lb

## 2024-04-18 DIAGNOSIS — R42 Dizziness and giddiness: Secondary | ICD-10-CM

## 2024-04-18 DIAGNOSIS — D333 Benign neoplasm of cranial nerves: Secondary | ICD-10-CM

## 2024-04-18 DIAGNOSIS — J343 Hypertrophy of nasal turbinates: Secondary | ICD-10-CM

## 2024-04-18 DIAGNOSIS — H903 Sensorineural hearing loss, bilateral: Secondary | ICD-10-CM

## 2024-04-18 DIAGNOSIS — J310 Chronic rhinitis: Secondary | ICD-10-CM

## 2024-04-18 DIAGNOSIS — H6123 Impacted cerumen, bilateral: Secondary | ICD-10-CM

## 2024-04-18 DIAGNOSIS — H6983 Other specified disorders of Eustachian tube, bilateral: Secondary | ICD-10-CM | POA: Insufficient documentation

## 2024-04-18 NOTE — Progress Notes (Addendum)
  [address], [address] [place], [place] [address] [phone]  Hearing Aid Check     Jessica Russell walks in for a hearing aid check and drops of the right hearing aid.     Right Left   Hearing aid manufacturer Oticon  Cross PX SN: [id number] OticonReal 3 miniRITE   Hearing aid style Receiver in the ear Receiver in the ear  Hearing aid battery rechargeable rechargeable  Receiver 3-85 3-85  Dome/ custom earpiece Open dome 8mm Open dome 8mm Also has a Mold -hard minifit with canal lock-SN:[id number]   Retention wire none none  Warranty expiration date 11-02-2024 11-02-2024  Loss and Damage      Additional accessories Expiration date Remote control [id number] Smart Charger [id number]    Initial fitting date 10-15-2021 10-15-2021  [facility]  Hearing aid services Bundled until: 11-02-2024   Bundled until:11-02-2024   Chief complaint: Patient reports right dome broke.  Actions taken: Inspection of the device and listening check showed that right receiver broke (beyond the wax guard.  Replaced receiver under warranty.  Services fee: $0 was paid at checkout. Patient is going to be called by staff member to pick up hearing aid.  Addendum: Patient was in office still and patient to ok the aid with her after it was repaired.  Recommend: Return for a hearing aid check as needed. Return for a hearing evaluation and to see an ENT, if concerns with hearing changes arise.   Jessica Russell MARIE LEROUX-MARTINEZ, AUD

## 2024-04-18 NOTE — Progress Notes (Signed)
 Patient ID: Jessica Russell, female   DOB: [birth year], 89 y.o.   MRN: [id number]  Follow-up: Right IAC acoustic neuroma, recurrent dizziness, asymmetric hearing loss, chronic nasal congestion, eustachian tube dysfunction  History of Present Illness Jessica Russell is a 89 year old female with acoustic neuroma and eustachian tube dysfunction who presents with ear congestion and balance issues.  She experiences persistent ear congestion, describing it as feeling like her voice is 'in a box.'  She uses Flonase intermittently based on symptom severity.  She has a history of acoustic neuroma in the right ear, affecting her hearing and balance. Significant balance issues are attributed to neuropathy and the tumor impacting her balance nerve. She uses a walker or cane for mobility.  Currently she denies any otalgia, otorrhea, or spinning vertigo. Her 1.6 cm right IAC acoustic neuroma was treated with gamma knife radiation in March 2022. Her hearing loss was treated with bilateral hearing aids.   She also reports chronic nasal congestion.  She was previously noted to have bilateral inferior turbinate hypertrophy. The patient was treated with Flonase, Atrovent , and Valsalva exercise.   Exam: General: Communicates without difficulty, well nourished, no acute distress. Head: Normocephalic, no evidence injury, no tenderness, facial buttresses intact without stepoff. Eyes: PERRL, EOMI. No scleral icterus, conjunctivae clear. Neuro: CN II exam reveals vision grossly intact.  No nystagmus at any point of gaze. Ears: Bilateral cerumen impaction.  The left tympanic membrane and middle ear space are normal.  Nose: External evaluation reveals normal support and skin without lesions.  Dorsum is intact.  Anterior rhinoscopy reveals congested and edematous mucosa over anterior aspect of the inferior turbinates and deviated nasal septum. Oral:  Oral cavity and oropharynx are intact, symmetric, without erythema  or edema.  Mucosa is moist without lesions. Neck: Full range of motion without pain.  There is no significant lymphadenopathy.  No masses palpable.  Thyroid  bed within normal limits to palpation.  Parotid glands and submandibular glands equal bilaterally without mass.  Trachea is midline. Neuro:  CN 2-12 grossly intact. Gait wide-based. Vestibular: There is no nystagmus with pneumatic pressure on either tympanic membrane or Valsalva. The cerebellar examination is unremarkable.    Procedure: Bilateral cerumen disimpaction Anesthesia: None Description: Under the operating microscope, the cerumen is carefully removed with a combination of cerumen currette, alligator forceps, and suction catheters.  After the cerumen is removed, the TMs are noted to be normal.  No mass, erythema, or lesions. The patient tolerated the procedure well. Assessment & Plan Eustachian tube dysfunction with chronic rhinitis and turbinate hypertrophy Chronic rhinitis with nasal mucosal congestion and turbinate hypertrophy causing eustachian tube dysfunction. Symptoms include ear congestion and pressure, exacerbated by nasal congestion. - Scheduled tympanostomy tube placement in the left ear to alleviate eustachian tube dysfunction. - Continue Flonase nasal spray as needed for nasal congestion.  Right internal auditory canal acoustic neuroma Contributing to hearing loss and balance issues. The tumor is pressing against the eardrum, affecting hearing in the right ear.   - The patient was treated with gamma knife radiation in March 2022.  Bilateral sensorineural hearing loss Primarily due to right internal auditory canal acoustic neuroma. Right ear hearing is significantly impaired, and left ear hearing is also affected but less severely. -Continue the use of her bilateral hearing aids.  Bilateral cerumen impaction -Otomicroscopy with bilateral cerumen disimpaction.  Recurrent dizziness and balance impairment secondary to  acoustic neuroma Recurrent dizziness and balance impairment due to acoustic neuroma affecting the balance  nerve. Balance issues are significant, requiring the use of a walker or cane for stability. - Continue using walker or cane for stability. - Encouraged participation in balance exercises to improve stability.

## 2024-06-01 ENCOUNTER — Ambulatory Visit (INDEPENDENT_AMBULATORY_CARE_PROVIDER_SITE_OTHER): Admitting: Otolaryngology

## 2024-06-01 ENCOUNTER — Ambulatory Visit (INDEPENDENT_AMBULATORY_CARE_PROVIDER_SITE_OTHER): Admitting: Audiology

## 2024-06-20 ENCOUNTER — Ambulatory Visit: Admitting: Cardiovascular Disease
